# Patient Record
Sex: Male | Born: 1935 | Race: White | Hispanic: No | Marital: Married | Smoking: Never smoker
Health system: Southern US, Community
[De-identification: ages and names within clinical notes are randomized; demographics above are authoritative.]

## PROBLEM LIST (undated history)

## (undated) DIAGNOSIS — I10 Essential (primary) hypertension: Secondary | ICD-10-CM

## (undated) DIAGNOSIS — IMO0002 Reserved for concepts with insufficient information to code with codable children: Secondary | ICD-10-CM

## (undated) DIAGNOSIS — G5600 Carpal tunnel syndrome, unspecified upper limb: Secondary | ICD-10-CM

## (undated) DIAGNOSIS — E785 Hyperlipidemia, unspecified: Secondary | ICD-10-CM

## (undated) DIAGNOSIS — Z8619 Personal history of other infectious and parasitic diseases: Secondary | ICD-10-CM

## (undated) DIAGNOSIS — I4891 Unspecified atrial fibrillation: Secondary | ICD-10-CM

## (undated) DIAGNOSIS — D649 Anemia, unspecified: Secondary | ICD-10-CM

## (undated) DIAGNOSIS — I480 Paroxysmal atrial fibrillation: Secondary | ICD-10-CM

## (undated) DIAGNOSIS — H9190 Unspecified hearing loss, unspecified ear: Secondary | ICD-10-CM

## (undated) DIAGNOSIS — R7611 Nonspecific reaction to tuberculin skin test without active tuberculosis: Secondary | ICD-10-CM

## (undated) DIAGNOSIS — M1990 Unspecified osteoarthritis, unspecified site: Secondary | ICD-10-CM

## (undated) HISTORY — DX: Reserved for concepts with insufficient information to code with codable children: IMO0002

## (undated) HISTORY — PX: OTHER SURGICAL HISTORY: SHX169

## (undated) HISTORY — DX: Nonspecific reaction to tuberculin skin test without active tuberculosis: R76.11

## (undated) HISTORY — DX: Carpal tunnel syndrome, unspecified upper limb: G56.00

## (undated) HISTORY — DX: Anemia, unspecified: D64.9

## (undated) HISTORY — PX: EYE SURGERY: SHX253

## (undated) HISTORY — PX: CERVICAL FUSION: SHX112

## (undated) HISTORY — DX: Essential (primary) hypertension: I10

## (undated) HISTORY — DX: Hyperlipidemia, unspecified: E78.5

## (undated) HISTORY — PX: TONSILLECTOMY: SUR1361

---

## 1998-01-31 ENCOUNTER — Other Ambulatory Visit: Admission: RE | Admit: 1998-01-31 | Discharge: 1998-01-31 | Payer: Self-pay | Admitting: *Deleted

## 1998-02-11 ENCOUNTER — Other Ambulatory Visit: Admission: RE | Admit: 1998-02-11 | Discharge: 1998-02-11 | Payer: Self-pay | Admitting: *Deleted

## 2001-05-19 ENCOUNTER — Ambulatory Visit (HOSPITAL_COMMUNITY): Admission: RE | Admit: 2001-05-19 | Discharge: 2001-05-19 | Payer: Self-pay | Admitting: Gastroenterology

## 2001-10-14 ENCOUNTER — Encounter: Payer: Self-pay | Admitting: Gastroenterology

## 2001-10-14 ENCOUNTER — Encounter: Admission: RE | Admit: 2001-10-14 | Discharge: 2001-10-14 | Payer: Self-pay | Admitting: Gastroenterology

## 2004-03-31 ENCOUNTER — Ambulatory Visit (HOSPITAL_COMMUNITY): Admission: RE | Admit: 2004-03-31 | Discharge: 2004-03-31 | Payer: Self-pay | Admitting: Dermatology

## 2004-05-11 ENCOUNTER — Ambulatory Visit (HOSPITAL_COMMUNITY): Admission: RE | Admit: 2004-05-11 | Discharge: 2004-05-11 | Payer: Self-pay | Admitting: General Surgery

## 2004-05-11 ENCOUNTER — Ambulatory Visit (HOSPITAL_BASED_OUTPATIENT_CLINIC_OR_DEPARTMENT_OTHER): Admission: RE | Admit: 2004-05-11 | Discharge: 2004-05-11 | Payer: Self-pay | Admitting: General Surgery

## 2004-05-11 ENCOUNTER — Encounter (INDEPENDENT_AMBULATORY_CARE_PROVIDER_SITE_OTHER): Payer: Self-pay | Admitting: *Deleted

## 2004-06-27 ENCOUNTER — Ambulatory Visit (HOSPITAL_COMMUNITY): Admission: RE | Admit: 2004-06-27 | Discharge: 2004-06-27 | Payer: Self-pay | Admitting: General Surgery

## 2004-06-27 ENCOUNTER — Encounter (INDEPENDENT_AMBULATORY_CARE_PROVIDER_SITE_OTHER): Payer: Self-pay | Admitting: *Deleted

## 2006-08-16 HISTORY — PX: OTHER SURGICAL HISTORY: SHX169

## 2006-08-16 LAB — HM COLONOSCOPY

## 2006-12-11 ENCOUNTER — Ambulatory Visit: Payer: Self-pay | Admitting: Family Medicine

## 2006-12-11 DIAGNOSIS — D649 Anemia, unspecified: Secondary | ICD-10-CM

## 2006-12-11 DIAGNOSIS — M889 Osteitis deformans of unspecified bone: Secondary | ICD-10-CM | POA: Insufficient documentation

## 2006-12-12 ENCOUNTER — Ambulatory Visit: Payer: Self-pay | Admitting: Family Medicine

## 2006-12-12 LAB — CONVERTED CEMR LAB
AST: 26 units/L (ref 0–37)
Alkaline Phosphatase: 57 units/L (ref 39–117)
BUN: 18 mg/dL (ref 6–23)
Basophils Absolute: 0.1 10*3/uL (ref 0.0–0.1)
Basophils Relative: 1.1 % — ABNORMAL HIGH (ref 0.0–1.0)
Bilirubin, Direct: 0.1 mg/dL (ref 0.0–0.3)
CO2: 29 meq/L (ref 19–32)
Calcium: 9 mg/dL (ref 8.4–10.5)
Chloride: 105 meq/L (ref 96–112)
Cholesterol: 103 mg/dL (ref 0–200)
Creatinine, Ser: 0.8 mg/dL (ref 0.4–1.5)
Eosinophils Absolute: 0.2 10*3/uL (ref 0.0–0.6)
Eosinophils Relative: 3.7 % (ref 0.0–5.0)
Ferritin: 101 ng/mL (ref 22.0–322.0)
GFR calc non Af Amer: 99 mL/min
Glucose, Bld: 140 mg/dL — ABNORMAL HIGH (ref 70–99)
HDL: 35.3 mg/dL — ABNORMAL LOW (ref >39.0)
Hemoglobin: 11 g/dL — ABNORMAL LOW (ref 13.0–17.0)
LDL Cholesterol: 42 mg/dL (ref 0–99)
Lymphocytes Relative: 27.5 % (ref 12.0–46.0)
MCHC: 34.7 g/dL (ref 30.0–36.0)
MCV: 92 fL (ref 78.0–100.0)
Monocytes Relative: 8.1 % (ref 3.0–11.0)
Neutrophils Relative %: 59.6 % (ref 43.0–77.0)
Total Bilirubin: 1 mg/dL (ref 0.3–1.2)

## 2006-12-17 ENCOUNTER — Ambulatory Visit: Payer: Self-pay | Admitting: Family Medicine

## 2006-12-17 ENCOUNTER — Encounter: Payer: Self-pay | Admitting: *Deleted

## 2007-01-28 ENCOUNTER — Ambulatory Visit: Payer: Self-pay | Admitting: Family Medicine

## 2007-01-28 ENCOUNTER — Encounter: Payer: Self-pay | Admitting: *Deleted

## 2007-02-25 ENCOUNTER — Encounter: Payer: Self-pay | Admitting: Family Medicine

## 2007-03-06 ENCOUNTER — Ambulatory Visit: Payer: Self-pay | Admitting: Family Medicine

## 2007-04-04 ENCOUNTER — Ambulatory Visit: Payer: Self-pay | Admitting: Family Medicine

## 2007-05-02 ENCOUNTER — Ambulatory Visit: Payer: Self-pay | Admitting: Family Medicine

## 2007-05-12 ENCOUNTER — Ambulatory Visit: Payer: Self-pay | Admitting: Family Medicine

## 2007-05-12 DIAGNOSIS — E785 Hyperlipidemia, unspecified: Secondary | ICD-10-CM

## 2007-05-12 DIAGNOSIS — N259 Disorder resulting from impaired renal tubular function, unspecified: Secondary | ICD-10-CM

## 2007-05-12 DIAGNOSIS — E1169 Type 2 diabetes mellitus with other specified complication: Secondary | ICD-10-CM | POA: Insufficient documentation

## 2007-05-13 LAB — CONVERTED CEMR LAB
ALT: 15 units/L (ref 0–53)
BUN: 17 mg/dL (ref 6–23)
Basophils Absolute: 0.1 10*3/uL (ref 0.0–0.1)
CO2: 30 meq/L (ref 19–32)
Chloride: 109 meq/L (ref 96–112)
Creatinine, Ser: 1.1 mg/dL (ref 0.4–1.5)
Eosinophils Relative: 3.4 % (ref 0.0–5.0)
GFR calc Af Amer: 83 mL/min
Glucose, Bld: 125 mg/dL — ABNORMAL HIGH (ref 70–99)
Hgb A1c MFr Bld: 6.9 % — ABNORMAL HIGH (ref 4.6–6.0)
LDL Cholesterol: 48 mg/dL (ref 0–99)
Lymphocytes Relative: 34.3 % (ref 12.0–46.0)
Potassium: 5.7 meq/L — ABNORMAL HIGH (ref 3.5–5.1)
RDW: 12.9 % (ref 11.5–14.6)
Sodium: 146 meq/L — ABNORMAL HIGH (ref 135–145)
TSH: 2.43 microintl units/mL (ref 0.35–5.50)
Total CHOL/HDL Ratio: 3.9
Total Protein: 6.5 g/dL (ref 6.0–8.3)

## 2007-05-30 ENCOUNTER — Ambulatory Visit: Payer: Self-pay | Admitting: Family Medicine

## 2007-06-30 ENCOUNTER — Ambulatory Visit: Payer: Self-pay | Admitting: Family Medicine

## 2007-07-02 ENCOUNTER — Encounter: Payer: Self-pay | Admitting: Family Medicine

## 2007-07-21 ENCOUNTER — Ambulatory Visit: Payer: Self-pay | Admitting: Family Medicine

## 2007-07-21 DIAGNOSIS — I10 Essential (primary) hypertension: Secondary | ICD-10-CM

## 2007-07-30 ENCOUNTER — Ambulatory Visit: Payer: Self-pay | Admitting: Family Medicine

## 2007-07-30 DIAGNOSIS — M545 Low back pain: Secondary | ICD-10-CM

## 2007-08-04 ENCOUNTER — Ambulatory Visit: Payer: Self-pay | Admitting: Family Medicine

## 2007-08-04 DIAGNOSIS — H6980 Other specified disorders of Eustachian tube, unspecified ear: Secondary | ICD-10-CM

## 2007-08-11 ENCOUNTER — Ambulatory Visit: Payer: Self-pay | Admitting: Family Medicine

## 2007-08-11 DIAGNOSIS — H9190 Unspecified hearing loss, unspecified ear: Secondary | ICD-10-CM | POA: Insufficient documentation

## 2007-08-12 ENCOUNTER — Telehealth: Payer: Self-pay | Admitting: Family Medicine

## 2007-08-21 ENCOUNTER — Encounter: Payer: Self-pay | Admitting: Family Medicine

## 2007-08-26 ENCOUNTER — Encounter: Payer: Self-pay | Admitting: Family Medicine

## 2007-08-29 ENCOUNTER — Encounter: Payer: Self-pay | Admitting: Family Medicine

## 2007-09-03 ENCOUNTER — Ambulatory Visit: Payer: Self-pay | Admitting: Family Medicine

## 2007-09-24 ENCOUNTER — Ambulatory Visit: Payer: Self-pay | Admitting: Family Medicine

## 2007-09-26 ENCOUNTER — Telehealth: Payer: Self-pay | Admitting: Family Medicine

## 2007-10-21 ENCOUNTER — Encounter: Payer: Self-pay | Admitting: Family Medicine

## 2007-10-31 ENCOUNTER — Ambulatory Visit: Payer: Self-pay | Admitting: Family Medicine

## 2007-11-28 ENCOUNTER — Ambulatory Visit: Payer: Self-pay | Admitting: Family Medicine

## 2007-12-10 ENCOUNTER — Encounter: Payer: Self-pay | Admitting: Family Medicine

## 2007-12-15 ENCOUNTER — Ambulatory Visit: Payer: Self-pay | Admitting: Family Medicine

## 2007-12-15 DIAGNOSIS — B029 Zoster without complications: Secondary | ICD-10-CM | POA: Insufficient documentation

## 2007-12-16 ENCOUNTER — Telehealth: Payer: Self-pay | Admitting: Family Medicine

## 2007-12-29 ENCOUNTER — Ambulatory Visit: Payer: Self-pay | Admitting: Family Medicine

## 2007-12-29 DIAGNOSIS — F411 Generalized anxiety disorder: Secondary | ICD-10-CM | POA: Insufficient documentation

## 2007-12-29 DIAGNOSIS — R252 Cramp and spasm: Secondary | ICD-10-CM | POA: Insufficient documentation

## 2008-01-06 ENCOUNTER — Ambulatory Visit: Payer: Self-pay | Admitting: Family Medicine

## 2008-01-06 LAB — CONVERTED CEMR LAB
ALT: 35 units/L (ref 0–53)
AST: 39 units/L — ABNORMAL HIGH (ref 0–37)
Albumin: 3.1 g/dL — ABNORMAL LOW (ref 3.5–5.2)
BUN: 13 mg/dL (ref 6–23)
Basophils Absolute: 0 10*3/uL (ref 0.0–0.1)
Bilirubin, Direct: 0.1 mg/dL (ref 0.0–0.3)
CO2: 24 meq/L (ref 19–32)
Chloride: 89 meq/L — ABNORMAL LOW (ref 96–112)
Creatinine, Ser: 1.2 mg/dL (ref 0.4–1.5)
Eosinophils Absolute: 0 10*3/uL (ref 0.0–0.7)
Glucose, Bld: 315 mg/dL — ABNORMAL HIGH (ref 70–99)
HCT: 31.4 % — ABNORMAL LOW (ref 39.0–52.0)
Lymphocytes Relative: 10 % — ABNORMAL LOW (ref 12.0–46.0)
Monocytes Absolute: 0.6 10*3/uL (ref 0.1–1.0)
Neutrophils Relative %: 81.5 % — ABNORMAL HIGH (ref 43.0–77.0)
RBC: 3.36 M/uL — ABNORMAL LOW (ref 4.22–5.81)
TSH: 1.59 microintl units/mL (ref 0.35–5.50)
Total Bilirubin: 0.8 mg/dL (ref 0.3–1.2)
Total CK: 156 units/L (ref 7–195)
WBC: 7.9 10*3/uL (ref 4.5–10.5)

## 2008-01-12 ENCOUNTER — Ambulatory Visit: Payer: Self-pay | Admitting: Family Medicine

## 2008-01-13 ENCOUNTER — Telehealth: Payer: Self-pay | Admitting: Family Medicine

## 2008-01-13 ENCOUNTER — Encounter: Admission: RE | Admit: 2008-01-13 | Discharge: 2008-01-13 | Payer: Self-pay | Admitting: Family Medicine

## 2008-01-14 ENCOUNTER — Telehealth: Payer: Self-pay | Admitting: Family Medicine

## 2008-01-29 ENCOUNTER — Ambulatory Visit: Payer: Self-pay | Admitting: Family Medicine

## 2008-02-09 DIAGNOSIS — E872 Acidosis: Secondary | ICD-10-CM

## 2008-02-20 ENCOUNTER — Telehealth: Payer: Self-pay | Admitting: Family Medicine

## 2008-02-23 ENCOUNTER — Ambulatory Visit: Payer: Self-pay | Admitting: Family Medicine

## 2008-02-27 ENCOUNTER — Telehealth: Payer: Self-pay | Admitting: Family Medicine

## 2008-03-01 ENCOUNTER — Encounter: Payer: Self-pay | Admitting: Family Medicine

## 2008-03-05 ENCOUNTER — Encounter: Admission: RE | Admit: 2008-03-05 | Discharge: 2008-03-05 | Payer: Self-pay | Admitting: Neurology

## 2008-03-08 ENCOUNTER — Encounter: Payer: Self-pay | Admitting: Family Medicine

## 2008-03-08 ENCOUNTER — Encounter: Admission: RE | Admit: 2008-03-08 | Discharge: 2008-04-30 | Payer: Self-pay | Admitting: Neurology

## 2008-03-25 ENCOUNTER — Ambulatory Visit: Payer: Self-pay | Admitting: Family Medicine

## 2008-03-31 ENCOUNTER — Encounter: Payer: Self-pay | Admitting: Family Medicine

## 2008-04-02 ENCOUNTER — Encounter: Payer: Self-pay | Admitting: Family Medicine

## 2008-04-22 ENCOUNTER — Ambulatory Visit: Payer: Self-pay | Admitting: Family Medicine

## 2008-05-04 ENCOUNTER — Ambulatory Visit: Payer: Self-pay | Admitting: Cardiovascular Disease

## 2008-05-04 ENCOUNTER — Ambulatory Visit: Payer: Self-pay | Admitting: Internal Medicine

## 2008-05-04 ENCOUNTER — Inpatient Hospital Stay (HOSPITAL_COMMUNITY): Admission: AD | Admit: 2008-05-04 | Discharge: 2008-05-11 | Payer: Self-pay | Admitting: Neurosurgery

## 2008-05-05 ENCOUNTER — Ambulatory Visit: Payer: Self-pay | Admitting: Cardiology

## 2008-05-05 ENCOUNTER — Encounter (INDEPENDENT_AMBULATORY_CARE_PROVIDER_SITE_OTHER): Payer: Self-pay | Admitting: Neurosurgery

## 2008-05-10 ENCOUNTER — Ambulatory Visit: Payer: Self-pay | Admitting: Physical Medicine & Rehabilitation

## 2008-05-11 ENCOUNTER — Ambulatory Visit: Payer: Self-pay | Admitting: Physical Medicine & Rehabilitation

## 2008-05-11 ENCOUNTER — Ambulatory Visit: Payer: Self-pay | Admitting: Cardiovascular Disease

## 2008-05-11 ENCOUNTER — Inpatient Hospital Stay (HOSPITAL_COMMUNITY)
Admission: RE | Admit: 2008-05-11 | Discharge: 2008-05-26 | Payer: Self-pay | Admitting: Physical Medicine & Rehabilitation

## 2008-05-26 ENCOUNTER — Encounter: Payer: Self-pay | Admitting: Family Medicine

## 2008-06-09 ENCOUNTER — Encounter: Payer: Self-pay | Admitting: Family Medicine

## 2008-06-28 ENCOUNTER — Telehealth: Payer: Self-pay | Admitting: Family Medicine

## 2008-06-29 ENCOUNTER — Encounter
Admission: RE | Admit: 2008-06-29 | Discharge: 2008-06-30 | Payer: Self-pay | Admitting: Physical Medicine & Rehabilitation

## 2008-06-30 ENCOUNTER — Ambulatory Visit: Payer: Self-pay | Admitting: Physical Medicine & Rehabilitation

## 2008-07-01 ENCOUNTER — Ambulatory Visit: Payer: Self-pay | Admitting: Family Medicine

## 2008-07-01 DIAGNOSIS — M542 Cervicalgia: Secondary | ICD-10-CM

## 2008-07-12 ENCOUNTER — Telehealth: Payer: Self-pay | Admitting: Family Medicine

## 2008-07-13 ENCOUNTER — Telehealth: Payer: Self-pay | Admitting: Family Medicine

## 2008-07-26 ENCOUNTER — Telehealth: Payer: Self-pay | Admitting: Family Medicine

## 2008-07-28 ENCOUNTER — Encounter: Payer: Self-pay | Admitting: Family Medicine

## 2008-08-03 ENCOUNTER — Ambulatory Visit: Payer: Self-pay | Admitting: Family Medicine

## 2008-08-05 ENCOUNTER — Encounter: Payer: Self-pay | Admitting: Family Medicine

## 2008-08-05 ENCOUNTER — Encounter: Admission: RE | Admit: 2008-08-05 | Discharge: 2008-11-01 | Payer: Self-pay | Admitting: Neurology

## 2008-08-11 ENCOUNTER — Telehealth: Payer: Self-pay | Admitting: Family Medicine

## 2008-08-18 ENCOUNTER — Encounter: Payer: Self-pay | Admitting: Family Medicine

## 2008-08-24 ENCOUNTER — Ambulatory Visit: Payer: Self-pay | Admitting: Physical Medicine & Rehabilitation

## 2008-08-24 ENCOUNTER — Encounter
Admission: RE | Admit: 2008-08-24 | Discharge: 2008-08-24 | Payer: Self-pay | Admitting: Physical Medicine & Rehabilitation

## 2008-08-31 ENCOUNTER — Ambulatory Visit (HOSPITAL_COMMUNITY): Admission: RE | Admit: 2008-08-31 | Discharge: 2008-08-31 | Payer: Self-pay | Admitting: Neurology

## 2008-09-02 ENCOUNTER — Ambulatory Visit: Payer: Self-pay | Admitting: Family Medicine

## 2008-09-27 ENCOUNTER — Ambulatory Visit: Payer: Self-pay | Admitting: Family Medicine

## 2008-09-28 ENCOUNTER — Encounter: Payer: Self-pay | Admitting: Family Medicine

## 2008-09-28 LAB — CONVERTED CEMR LAB
ALT: 15 units/L (ref 0–53)
AST: 30 units/L (ref 0–37)
Albumin: 3.9 g/dL (ref 3.5–5.2)
Alkaline Phosphatase: 61 units/L (ref 39–117)
Basophils Absolute: 0 10*3/uL (ref 0.0–0.1)
Basophils Relative: 0 % (ref 0.0–3.0)
Calcium: 9.2 mg/dL (ref 8.4–10.5)
Eosinophils Absolute: 0 10*3/uL (ref 0.0–0.7)
GFR calc Af Amer: 104 mL/min
Glucose, Bld: 83 mg/dL (ref 70–99)
HDL: 41.5 mg/dL (ref >39.0)
Hemoglobin: 11.6 g/dL — ABNORMAL LOW (ref 13.0–17.0)
Hgb A1c MFr Bld: 6.5 % — ABNORMAL HIGH (ref 4.6–6.0)
MCHC: 34.3 g/dL (ref 30.0–36.0)
Monocytes Relative: 9.6 % (ref 3.0–12.0)
Platelets: 200 10*3/uL (ref 150–400)
Potassium: 4.4 meq/L (ref 3.5–5.1)
RDW: 12.9 % (ref 11.5–14.6)
TSH: 1.89 microintl units/mL (ref 0.35–5.50)
Total CHOL/HDL Ratio: 2.7
Total Protein: 6.6 g/dL (ref 6.0–8.3)
Triglycerides: 60 mg/dL (ref 0–149)
WBC: 5.8 10*3/uL (ref 4.5–10.5)

## 2008-10-19 ENCOUNTER — Encounter: Payer: Self-pay | Admitting: Family Medicine

## 2008-10-27 ENCOUNTER — Encounter: Admission: RE | Admit: 2008-10-27 | Discharge: 2009-01-25 | Payer: Self-pay | Admitting: Neurology

## 2008-10-28 ENCOUNTER — Ambulatory Visit: Payer: Self-pay | Admitting: Family Medicine

## 2008-11-02 ENCOUNTER — Telehealth: Payer: Self-pay | Admitting: Family Medicine

## 2008-11-10 ENCOUNTER — Encounter: Payer: Self-pay | Admitting: Family Medicine

## 2008-11-11 ENCOUNTER — Encounter: Payer: Self-pay | Admitting: Family Medicine

## 2008-11-22 ENCOUNTER — Ambulatory Visit: Payer: Self-pay | Admitting: Family Medicine

## 2008-12-01 ENCOUNTER — Ambulatory Visit: Payer: Self-pay | Admitting: Family Medicine

## 2008-12-07 ENCOUNTER — Encounter: Payer: Self-pay | Admitting: Family Medicine

## 2008-12-15 ENCOUNTER — Telehealth (INDEPENDENT_AMBULATORY_CARE_PROVIDER_SITE_OTHER): Payer: Self-pay

## 2008-12-16 ENCOUNTER — Telehealth (INDEPENDENT_AMBULATORY_CARE_PROVIDER_SITE_OTHER): Payer: Self-pay | Admitting: *Deleted

## 2008-12-17 ENCOUNTER — Telehealth (INDEPENDENT_AMBULATORY_CARE_PROVIDER_SITE_OTHER): Payer: Self-pay | Admitting: *Deleted

## 2008-12-17 ENCOUNTER — Telehealth: Payer: Self-pay | Admitting: Family Medicine

## 2008-12-20 ENCOUNTER — Telehealth (INDEPENDENT_AMBULATORY_CARE_PROVIDER_SITE_OTHER): Payer: Self-pay | Admitting: *Deleted

## 2008-12-24 ENCOUNTER — Ambulatory Visit: Payer: Self-pay | Admitting: Family Medicine

## 2008-12-30 ENCOUNTER — Telehealth (INDEPENDENT_AMBULATORY_CARE_PROVIDER_SITE_OTHER): Payer: Self-pay | Admitting: *Deleted

## 2009-01-03 ENCOUNTER — Ambulatory Visit: Payer: Self-pay

## 2009-01-03 ENCOUNTER — Encounter: Payer: Self-pay | Admitting: Family Medicine

## 2009-01-12 ENCOUNTER — Encounter: Payer: Self-pay | Admitting: Family Medicine

## 2009-01-24 ENCOUNTER — Ambulatory Visit: Payer: Self-pay | Admitting: Family Medicine

## 2009-01-24 ENCOUNTER — Telehealth: Payer: Self-pay | Admitting: Family Medicine

## 2009-02-10 ENCOUNTER — Encounter
Admission: RE | Admit: 2009-02-10 | Discharge: 2009-02-14 | Payer: Self-pay | Admitting: Physical Medicine & Rehabilitation

## 2009-02-11 ENCOUNTER — Ambulatory Visit: Payer: Self-pay | Admitting: Physical Medicine & Rehabilitation

## 2009-02-25 ENCOUNTER — Ambulatory Visit: Payer: Self-pay | Admitting: Family Medicine

## 2009-03-28 ENCOUNTER — Ambulatory Visit: Payer: Self-pay | Admitting: Family Medicine

## 2009-03-29 LAB — CONVERTED CEMR LAB: Hgb A1c MFr Bld: 7.2 % — ABNORMAL HIGH (ref 4.6–6.5)

## 2009-04-25 ENCOUNTER — Ambulatory Visit: Payer: Self-pay | Admitting: Family Medicine

## 2009-05-25 ENCOUNTER — Ambulatory Visit: Payer: Self-pay | Admitting: Family Medicine

## 2009-06-27 ENCOUNTER — Ambulatory Visit: Payer: Self-pay | Admitting: Family Medicine

## 2009-07-18 ENCOUNTER — Encounter: Payer: Self-pay | Admitting: Family Medicine

## 2009-07-20 ENCOUNTER — Encounter: Admission: RE | Admit: 2009-07-20 | Discharge: 2009-07-20 | Payer: Self-pay | Admitting: Neurosurgery

## 2009-07-25 ENCOUNTER — Ambulatory Visit: Payer: Self-pay | Admitting: Family Medicine

## 2009-07-27 ENCOUNTER — Encounter: Payer: Self-pay | Admitting: Family Medicine

## 2009-08-26 ENCOUNTER — Ambulatory Visit: Payer: Self-pay | Admitting: Family Medicine

## 2009-09-05 ENCOUNTER — Encounter: Payer: Self-pay | Admitting: Family Medicine

## 2009-09-20 ENCOUNTER — Telehealth: Payer: Self-pay | Admitting: Family Medicine

## 2009-09-26 ENCOUNTER — Encounter: Payer: Self-pay | Admitting: Family Medicine

## 2009-09-27 ENCOUNTER — Ambulatory Visit: Payer: Self-pay | Admitting: Family Medicine

## 2009-09-27 DIAGNOSIS — N529 Male erectile dysfunction, unspecified: Secondary | ICD-10-CM | POA: Insufficient documentation

## 2009-09-27 DIAGNOSIS — E538 Deficiency of other specified B group vitamins: Secondary | ICD-10-CM

## 2009-09-27 DIAGNOSIS — G5600 Carpal tunnel syndrome, unspecified upper limb: Secondary | ICD-10-CM | POA: Insufficient documentation

## 2009-10-03 ENCOUNTER — Encounter: Payer: Self-pay | Admitting: Family Medicine

## 2009-10-24 ENCOUNTER — Ambulatory Visit: Payer: Self-pay | Admitting: Family Medicine

## 2009-10-26 ENCOUNTER — Encounter: Payer: Self-pay | Admitting: Family Medicine

## 2009-10-26 DIAGNOSIS — E875 Hyperkalemia: Secondary | ICD-10-CM

## 2009-10-26 LAB — CONVERTED CEMR LAB
BUN: 11 mg/dL (ref 6–23)
Chloride: 99 meq/L (ref 96–112)
GFR calc non Af Amer: 86.06 mL/min (ref >60)
Glucose, Bld: 88 mg/dL (ref 70–99)

## 2009-11-03 ENCOUNTER — Telehealth: Payer: Self-pay | Admitting: Family Medicine

## 2009-11-10 ENCOUNTER — Telehealth: Payer: Self-pay | Admitting: Family Medicine

## 2009-11-17 ENCOUNTER — Encounter: Payer: Self-pay | Admitting: Family Medicine

## 2009-11-22 ENCOUNTER — Ambulatory Visit: Payer: Self-pay | Admitting: Family Medicine

## 2009-11-28 ENCOUNTER — Encounter: Payer: Self-pay | Admitting: Family Medicine

## 2009-12-28 ENCOUNTER — Ambulatory Visit: Payer: Self-pay | Admitting: Family Medicine

## 2010-01-10 ENCOUNTER — Encounter: Payer: Self-pay | Admitting: Family Medicine

## 2010-01-23 ENCOUNTER — Ambulatory Visit: Payer: Self-pay | Admitting: Family Medicine

## 2010-01-24 ENCOUNTER — Encounter: Payer: Self-pay | Admitting: Family Medicine

## 2010-02-07 ENCOUNTER — Encounter: Payer: Self-pay | Admitting: Family Medicine

## 2010-02-22 ENCOUNTER — Ambulatory Visit: Payer: Self-pay | Admitting: Family Medicine

## 2010-03-13 ENCOUNTER — Encounter: Payer: Self-pay | Admitting: Family Medicine

## 2010-03-16 ENCOUNTER — Encounter: Admission: RE | Admit: 2010-03-16 | Discharge: 2010-03-16 | Payer: Self-pay | Admitting: Neurosurgery

## 2010-03-22 ENCOUNTER — Encounter: Payer: Self-pay | Admitting: Family Medicine

## 2010-03-24 ENCOUNTER — Ambulatory Visit: Payer: Self-pay | Admitting: Family Medicine

## 2010-03-27 LAB — CONVERTED CEMR LAB: Hgb A1c MFr Bld: 6.6 % — ABNORMAL HIGH (ref 4.6–6.5)

## 2010-05-01 ENCOUNTER — Ambulatory Visit: Payer: Self-pay | Admitting: Family Medicine

## 2010-05-26 ENCOUNTER — Ambulatory Visit: Payer: Self-pay | Admitting: Family Medicine

## 2010-06-13 ENCOUNTER — Encounter: Payer: Self-pay | Admitting: Family Medicine

## 2010-06-27 ENCOUNTER — Ambulatory Visit: Payer: Self-pay | Admitting: Family Medicine

## 2010-07-19 ENCOUNTER — Encounter: Payer: Self-pay | Admitting: Family Medicine

## 2010-07-25 ENCOUNTER — Ambulatory Visit
Admission: RE | Admit: 2010-07-25 | Discharge: 2010-07-25 | Payer: Self-pay | Source: Home / Self Care | Attending: Family Medicine | Admitting: Family Medicine

## 2010-08-06 ENCOUNTER — Encounter: Payer: Self-pay | Admitting: Physical Medicine & Rehabilitation

## 2010-08-07 ENCOUNTER — Encounter: Payer: Self-pay | Admitting: Family Medicine

## 2010-08-13 LAB — CONVERTED CEMR LAB
ALT: 21 units/L (ref 0–53)
AST: 33 units/L (ref 0–37)
Albumin: 4.2 g/dL (ref 3.5–5.2)
Alkaline Phosphatase: 60 units/L (ref 39–117)
Alkaline Phosphatase: 88 units/L (ref 39–117)
BUN: 12 mg/dL (ref 6–23)
BUN: 23 mg/dL (ref 6–23)
Basophils Absolute: 0 10*3/uL (ref 0.0–0.1)
Basophils Relative: 0.2 % (ref 0.0–3.0)
Basophils Relative: 0.7 % (ref 0.0–3.0)
Bilirubin, Direct: 0.1 mg/dL (ref 0.0–0.3)
Blood Glucose, Fingerstick: 202
CO2: 25 meq/L (ref 19–32)
Calcium: 9.2 mg/dL (ref 8.4–10.5)
Calcium: 9.3 mg/dL (ref 8.4–10.5)
Chloride: 102 meq/L (ref 96–112)
Chloride: 97 meq/L (ref 96–112)
Cholesterol: 105 mg/dL (ref 0–200)
Cholesterol: 106 mg/dL (ref 0–200)
Creatinine, Ser: 0.9 mg/dL (ref 0.4–1.5)
Creatinine,U: 71.2 mg/dL
GFR calc non Af Amer: 69 mL/min
GFR calc non Af Amer: 86.08 mL/min (ref >60)
Glucose, Bld: 96 mg/dL (ref 70–99)
Glucose, Bld: 97 mg/dL (ref 70–99)
Glucose, Urine, Semiquant: 250
HCT: 31 % — ABNORMAL LOW (ref 39.0–52.0)
HDL: 57.1 mg/dL (ref >39.00)
Hemoglobin: 10.6 g/dL — ABNORMAL LOW (ref 13.0–17.0)
Hgb A1c MFr Bld: 8.3 % — ABNORMAL HIGH (ref 4.6–6.0)
Lymphocytes Relative: 29.2 % (ref 12.0–46.0)
MCHC: 34.2 g/dL (ref 30.0–36.0)
Microalb, Ur: 0.5 mg/dL (ref 0.0–1.9)
Monocytes Absolute: 0.6 10*3/uL (ref 0.1–1.0)
Neutro Abs: 4.4 10*3/uL (ref 1.4–7.7)
Nitrite: NEGATIVE
PSA: 2.03 ng/mL (ref 0.10–4.00)
Potassium: 5.7 meq/L — ABNORMAL HIGH (ref 3.5–5.1)
Protein, U semiquant: NEGATIVE
RBC: 3.29 M/uL — ABNORMAL LOW (ref 4.22–5.81)
Specific Gravity, Urine: 1.01
Testosterone: 359.18 ng/dL (ref 350.00–890.00)
Total Bilirubin: 1 mg/dL (ref 0.3–1.2)
Total CHOL/HDL Ratio: 2
Total Protein: 6.8 g/dL (ref 6.0–8.3)
VLDL: 14.2 mg/dL (ref 0.0–40.0)
Vitamin B-12: >1500 pg/mL — ABNORMAL HIGH (ref 211–911)
WBC Urine, dipstick: NEGATIVE
pH: 5

## 2010-08-17 NOTE — Progress Notes (Signed)
Summary: speak to Andrew Norton  Phone Note Call from Patient Call back at Home Phone [phone]   Caller: Patient--live call Summary of Call: wants Andrew Norton to call him about his referral. Initial call taken by: Warnell Forester,  November 03, 2009 8:53 AM  Follow-up for Phone Call        Terri, I called him- he says it's been a long time to wait. I said you were working on it. Follow-up by: Raechel Ache, RN,  November 03, 2009 9:02 AM  Additional Follow-up for Phone Call Additional follow up Details #1::        I tried to call pt to explain process, but was unable to reach him. Additional Follow-up by: Corky Mull,  November 03, 2009 11:13 AM

## 2010-08-17 NOTE — Letter (Signed)
Summary: [facility]  [facility]   Imported By: Maryln Gottron 01/27/2010 14:40:19  _____________________________________________________________________  External Attachment:    Type:   Image     Comment:   External Document

## 2010-08-17 NOTE — Assessment & Plan Note (Signed)
Summary: b12 inj/njr  Nurse Visit   Allergies: 1)  Penicillin V Potassium (Penicillin V Potassium)  Medication Administration  Injection # 1:    Medication: Vit B12 1000 mcg    Diagnosis: VITAMIN B12 DEFICIENCY (ICD-266.2)    Route: IM    Site: L deltoid    Exp Date: 02/13    Lot #: [id number]    Mfr: [employer]    Patient tolerated injection without complications    Given by: Raechel Ache, RN (January 23, 2010 10:21 AM)  Orders Added: 1)  Vit B12 1000 mcg [J3420] 2)  Admin of Therapeutic Inj  intramuscular or subcutaneous [16109]

## 2010-08-17 NOTE — Assessment & Plan Note (Signed)
Summary: b12 inj/njr  Nurse Visit   Allergies: 1)  Penicillin V Potassium (Penicillin V Potassium)  Medication Administration  Injection # 1:    Medication: Vit B12 1000 mcg    Diagnosis: VITAMIN B12 DEFICIENCY (ICD-266.2)    Route: IM    Site: L deltoid    Exp Date: 09/12    Lot #: [id number]    Mfr: [employer]    Patient tolerated injection without complications    Given by: Raechel Ache, RN (December 28, 2009 2:59 PM)  Orders Added: 1)  Vit B12 1000 mcg [J3420] 2)  Admin of Therapeutic Inj  intramuscular or subcutaneous [01027]

## 2010-08-17 NOTE — Letter (Signed)
Summary: [facility]  [facility]   Imported By: Maryln Gottron 02/13/2010 11:24:33  _____________________________________________________________________  External Attachment:    Type:   Image     Comment:   External Document

## 2010-08-17 NOTE — Assessment & Plan Note (Signed)
Summary: B12 INJ // RS  Nurse Visit   Allergies: 1)  Penicillin V Potassium (Penicillin V Potassium)  Medication Administration  Injection # 1:    Medication: Vit B12 1000 mcg    Diagnosis: VITAMIN B12 DEFICIENCY (ICD-266.2)    Route: IM    Site: L deltoid    Exp Date: 09/12    Lot #: [id number]    Mfr: [employer]    Patient tolerated injection without complications    Given by: Raechel Ache, RN (February 22, 2010 10:08 AM)  Orders Added: 1)  Vit B12 1000 mcg [J3420] 2)  Admin of Therapeutic Inj  intramuscular or subcutaneous [96045]

## 2010-08-17 NOTE — Letter (Signed)
Summary: [facility]  [facility]   Imported By: Maryln Gottron 08/04/2009 13:45:00  _____________________________________________________________________  External Attachment:    Type:   Image     Comment:   External Document

## 2010-08-17 NOTE — Assessment & Plan Note (Signed)
Summary: B12 INJ // RS left delt  Nurse Visit   Allergies: 1)  Penicillin V Potassium (Penicillin V Potassium)  Medication Administration  Injection # 1:    Medication: Vit B12 1000 mcg    Diagnosis: VITAMIN B12 DEFICIENCY (ICD-266.2)    Route: IM    Site: L deltoid    Exp Date: 01/2012    Lot #: [id number]    Mfr: [employer]    Patient tolerated injection without complications    Given by: Pura Spice, RN (May 01, 2010 10:24 AM)  Orders Added: 1)  Vit B12 1000 mcg [J3420] 2)  Admin of Therapeutic Inj  intramuscular or subcutaneous [60454]

## 2010-08-17 NOTE — Assessment & Plan Note (Signed)
Summary: B-12 INJ/CJR  Nurse Visit   Allergies: 1)  Penicillin V Potassium (Penicillin V Potassium)  Medication Administration  Injection # 1:    Medication: Vit B12 1000 mcg    Diagnosis: VITAMIN B12 DEFICIENCY (ICD-266.2)    Route: IM    Site: L deltoid    Exp Date: 02/14/2012    Lot #: [id number]    Mfr: [employer]    Patient tolerated injection without complications    Given by: Romualdo Bolk, CMA (AAMA) (July 25, 2010 10:18 AM)  Orders Added: 1)  Vit B12 1000 mcg [J3420] 2)  Admin of Therapeutic Inj  intramuscular or subcutaneous [82956]

## 2010-08-17 NOTE — Assessment & Plan Note (Signed)
Summary: B12 INJ // RS rt deltoid  Nurse Visit   Allergies: 1)  Penicillin V Potassium (Penicillin V Potassium)  Medication Administration  Injection # 1:    Medication: Vit B12 1000 mcg    Diagnosis: ANEMIA-NOS (ICD-285.9)    Route: IM    Site: R deltoid    Exp Date: 03/2011    Lot #: [id number]    Mfr: [employer]    Patient tolerated injection without complications    Given by: Pura Spice, RN (August 26, 2009 9:29 AM)  Orders Added: 1)  Vit B12 1000 mcg [J3420] 2)  Admin of Therapeutic Inj  intramuscular or subcutaneous [96045]

## 2010-08-17 NOTE — Assessment & Plan Note (Signed)
Summary: B-12 INJ/CJR  Nurse Visit   Allergies: 1)  Penicillin V Potassium (Penicillin V Potassium)  Medication Administration  Injection # 1:    Medication: Vit B12 1000 mcg    Diagnosis: VITAMIN B12 DEFICIENCY (ICD-266.2)    Route: IM    Site: L deltoid    Exp Date: 09/12    Lot #: [id number]    Mfr: [employer]    Patient tolerated injection without complications    Given by: Raechel Ache, RN (October 24, 2009 10:27 AM)  Orders Added: 1)  TLB-BMP (Basic Metabolic Panel-BMET) [80048-METABOL] 2)  Vit B12 1000 mcg [J3420] 3)  Admin of Therapeutic Inj  intramuscular or subcutaneous [96372]  Appended Document: Orders Update    Clinical Lists Changes  Orders: Added new Service order of Venipuncture [id number]) - Signed

## 2010-08-17 NOTE — Assessment & Plan Note (Signed)
Summary: b12 inj//ccm  Nurse Visit   Allergies: 1)  Penicillin V Potassium (Penicillin V Potassium)  Medication Administration  Injection # 1:    Medication: Vit B12 1000 mcg    Diagnosis: VITAMIN B12 DEFICIENCY (ICD-266.2)    Route: SQ    Site: R deltoid    Exp Date: 01/15/2012    Lot #: [id number]    Mfr: [employer]    Patient tolerated injection without complications    Given by: Lynann Beaver CMA AAMA (June 27, 2010 10:31 AM)  Orders Added: 1)  Admin of Therapeutic Inj  intramuscular or subcutaneous [56213]

## 2010-08-17 NOTE — Assessment & Plan Note (Signed)
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 He feels well in general,and he continues to improve after his neck surgery. His glucose and BP readings at home are stable.   Allergies: 1)  Penicillin V Potassium (Penicillin V Potassium)  Past History:  Past Medical History: Reviewed history from 09/27/2009 and no changes required. 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 Margo Aye for skin checks sees Dr. Pearlean Brownie for eye exams gait instability, sees Dr. Sandria Manly carpal tunnel, bilateral per Dr. Sandria Manly  Past Surgical History: Reviewed history from 07/01/2008 and no changes required. Tonsillectomy colonoscopy 2-08 per Dr. Randa Evens, normal EGD 2-08, normal removal of part of the scrotum for Paget's disease per Dr. Zachery Dakins 3 level cervical spine fusion per Dr. [name] 10-09  Review of Systems  The patient denies anorexia, fever, weight loss, weight gain, vision loss, decreased hearing, hoarseness, chest pain, syncope, dyspnea on exertion, peripheral edema, prolonged cough, headaches, hemoptysis, abdominal pain, melena, hematochezia, severe indigestion/heartburn, hematuria, incontinence, genital sores, muscle weakness, suspicious skin lesions, transient blindness, difficulty walking, depression, unusual weight change, abnormal bleeding, enlarged lymph nodes, angioedema, breast masses, and testicular masses.     Flu Vaccine Consent Questions     Do you have a history of severe allergic reactions to this vaccine? no    Any prior history of allergic reactions to egg and/or gelatin? no    Do you have a sensitivity to the preservative Thimersol? no    Do you have a past history of Guillan-Barre Syndrome? no    Do you currently have an acute febrile illness? no    Have you ever had a severe reaction to latex? no    Vaccine information given and explained to patient? yes    Are you currently pregnant? no    Lot Number:[id number]   Exp Date:01/13/2011   Site Given  Left Deltoid IM Pura Spice, RN  March 24, 2010 10:38 AM   Physical Exam  General:  Well-developed,well-nourished,in no acute distress; alert,appropriate and cooperative throughout examination Neck:  No deformities, masses, or tenderness noted. Lungs:  Normal respiratory effort, chest expands symmetrically. Lungs are clear to auscultation, no crackles or wheezes. Heart:  Normal rate and regular rhythm. S1 and S2 normal without gallop, murmur, click, rub or other extra sounds.   Impression & Recommendations:  Problem # 1:  DIABETES MELLITUS, TYPE II (ICD-250.00)  The following medications were removed from the medication list:    Diovan Hct 160-12.5 Mg Tabs (Valsartan-hydrochlorothiazide) .Marland Kitchen... Take 1 tablet by mouth once a day His updated medication list for this problem includes:    Adult Aspirin Low Strength 81 Mg Chew (Aspirin) .Marland Kitchen... Take 1 tablet by mouth once a  day    Glipizide 10 Mg Tabs (Glipizide) .Marland KitchenMarland KitchenMarland KitchenMarland Kitchen  One each am    Metformin Hcl 500 Mg Tabs (Metformin hcl) .Marland Kitchen..Marland Kitchen Two times a day    Losartan Potassium 50 Mg Tabs (Losartan potassium) ..... Once daily  Orders: Venipuncture (09811) TLB-A1C / Hgb A1C (Glycohemoglobin) (83036-A1C) Specimen Handling (91478)  Problem # 2:  VITAMIN B12 DEFICIENCY (ICD-266.2)  Orders: Vit B12 1000 mcg (J3420) Admin of Therapeutic Inj  intramuscular or subcutaneous (29562) Specimen  Handling (13086)  Problem # 3:  NECK PAIN, CHRONIC (ICD-723.1)  His updated medication list for this problem includes:    Adult Aspirin Low Strength 81 Mg Chew (Aspirin) .Marland Kitchen... Take 1 tablet by mouth once a  day  Problem # 4:  HYPERTENSION (ICD-401.9)  The following medications were removed from the medication list:    Diovan Hct 160-12.5 Mg Tabs (Valsartan-hydrochlorothiazide) .Marland Kitchen... Take 1 tablet by mouth once a day His updated medication list for this problem includes:    Losartan Potassium 50 Mg Tabs (Losartan potassium) ..... Once daily  Problem # 5:  RENAL INSUFFICIENCY (ICD-588.9)  Complete Medication List: 1)  Adult Aspirin Low Strength 81 Mg Chew (Aspirin) .... Take 1 tablet by mouth once a  day 2)  Glipizide 10 Mg Tabs (Glipizide) .... One each am 3)  Simvastatin 40 Mg Tabs (Simvastatin) .... Once daily 4)  Neurontin 100 Mg Caps (Gabapentin) .Marland Kitchen.. 1 three times a day and 2 at bedtime 5)  Metformin Hcl 500 Mg Tabs (Metformin hcl) .... Two times a day 6)  Senokot 8.6 Mg Tabs (Sennosides) .... Two hs 7)  Miralax Powd (Polyethylene glycol 3350) .Marland KitchenMarland Kitchen. 17 gms once daily 8)  Cyanocobalamin 1000 Mcg/ml Soln (Cyanocobalamin) .... Monthly 9)  Ferrous Sulfate 300 (60 Fe) Mg/71ml Syrp (Ferrous sulfate) .... One tsp once daily 10)  Losartan Potassium 50 Mg Tabs (Losartan potassium) .... Once daily  Other Orders: Flu Vaccine 46yrs + MEDICARE PATIENTS (V7846) Administration Flu vaccine - MCR (N6295)  Patient Instructions: 1)  Get an A1c today. We refilled his labs and went with generics where possible. Prescriptions: SIMVASTATIN 40 MG TABS (SIMVASTATIN) once daily  #90 x 3   Entered and Authorized by:   Nelwyn Salisbury MD   Signed by:   Nelwyn Salisbury MD on 03/24/2010   Method used:   Print then Give to Patient   RxID:   [id number] GLIPIZIDE 10 MG TABS (GLIPIZIDE) one each am  #90 x 3   Entered and Authorized by:   Nelwyn Salisbury MD   Signed by:   Nelwyn Salisbury MD on 03/24/2010    Method used:   Print then Give to Patient   RxID:   [id number] VFIEPPIR POTASSIUM 50 MG TABS (LOSARTAN POTASSIUM) once daily  #90 x 3   Entered and Authorized by:   Nelwyn Salisbury MD   Signed by:   Nelwyn Salisbury MD on 03/24/2010   Method used:   Print then Give to Patient   RxID:   [id number] MIRALAX  POWD (POLYETHYLENE GLYCOL 3350) 17 gms once daily  #30 x 11   Entered and Authorized by:   Nelwyn Salisbury MD   Signed by:   Nelwyn Salisbury MD on 03/24/2010   Method used:   Electronically to        [facility] [address] [id number]* (retail)       [address]       [place], [place]  [address]       Ph:  [phone] or [phone]       Fax: [phone]   RxID:   [id number] FERROUS SULFATE 300 (60 FE) MG/5ML SYRP (FERROUS SULFATE) one tsp once daily  #30 x 11   Entered and Authorized by:   Nelwyn Salisbury MD   Signed by:   Nelwyn Salisbury MD on 03/24/2010   Method used:   Electronically to        [facility]* (retail)       [address]       [place], [place]  [address]       Ph: [phone] or [phone]       Fax: [phone]   RxID:   [id number]    Medication Administration  Injection # 1:    Medication: Vit B12 1000 mcg    Diagnosis: VITAMIN B12 DEFICIENCY (ICD-266.2)    Route: IM    Site: R deltoid    Exp Date: 12/2011    Lot #: [id number]    Mfr: [employer]    Patient tolerated injection without complications    Given by: Pura Spice, RN (March 24, 2010 10:40 AM)  Orders Added: 1)  Flu Vaccine 72yrs + MEDICARE PATIENTS [Q2039] 2)  Administration Flu vaccine - MCR [G0008] 3)  Vit B12 1000 mcg [J3420] 4)  Admin of Therapeutic Inj  intramuscular or subcutaneous [96372] 5)  Venipuncture [36415] 6)  TLB-A1C / Hgb A1C (Glycohemoglobin) [83036-A1C] 7)  Specimen Handling [99000] 8)  Est. Patient Level IV [73220]

## 2010-08-17 NOTE — Progress Notes (Signed)
Summary: REQ FOR CPX APPT  Phone Note Call from Patient   Caller: Patient [phone] Reason for Call: Talk to Nurse, Talk to Doctor Summary of Call: Pt called to see if there was any way that he could come in for cpx within the next couple of weeks before his medications run out... Pt adv that he needs to come in for a med ck / refill appt but would like to have cpx done at same time as med ck / refill..... Pt adv that his meds will run out within the next couple of weeks and would like to come in for one ov to take care of it all if possible...Marland KitchenMarland KitchenMarland Kitchen Pt can be reached at his home # [phone]  or   his cell # [phone].  Initial call taken by: Debbra Riding,  September 20, 2009 9:56 AM  Follow-up for Phone Call        ok to work in except for Friday or Monday, pt can come in fasting Follow-up by: Alfred Levins, CMA,  September 20, 2009 11:03 AM  Additional Follow-up for Phone Call Additional follow up Details #1::        LMTCB--- Debbra Riding, September 20, 2009 11:54AM.  Phone Call Completed-----Pt has appt on September 27, 2009 @ 10:30am for cpx w/ Dr [name]...Marland KitchenMarland KitchenPt will come in fasting.  Debbra Riding, September 20, 2009 2:56PM  Additional Follow-up by: Debbra Riding,  September 20, 2009 11:54 AM

## 2010-08-17 NOTE — Consult Note (Signed)
Summary: [facility]  [facility]   Imported By: Maryln Gottron 12/01/2009 12:42:16  _____________________________________________________________________  External Attachment:    Type:   Image     Comment:   External Document

## 2010-08-17 NOTE — Miscellaneous (Signed)
Summary: Orders Update  Clinical Lists Changes  Problems: Added new problem of HYPERKALEMIA (ICD-276.7) Orders: Added new Referral order of Nephrology Referral (Nephro) - Signed

## 2010-08-17 NOTE — Assessment & Plan Note (Signed)
Summary: B-12 INJ/CJR  Nurse Visit   Allergies: 1)  Penicillin V Potassium (Penicillin V Potassium)  Medication Administration  Injection # 1:    Medication: Vit B12 1000 mcg    Diagnosis: VITAMIN B12 DEFICIENCY (ICD-266.2)    Route: IM    Site: R deltoid    Exp Date: 09/12    Lot #: [id number]    Mfr: [employer]    Patient tolerated injection without complications    Given by: Raechel Ache, RN (Nov 22, 2009 9:18 AM)  Orders Added: 1)  Vit B12 1000 mcg [J3420] 2)  Admin of Therapeutic Inj  intramuscular or subcutaneous [96045]

## 2010-08-17 NOTE — Letter (Signed)
Summary: [facility]  [facility]   Imported By: Maryln Gottron 10/11/2009 14:57:32  _____________________________________________________________________  External Attachment:    Type:   Image     Comment:   External Document

## 2010-08-17 NOTE — Letter (Signed)
Summary: [facility]  [facility]   Imported By: Maryln Gottron 08/15/2009 10:57:57  _____________________________________________________________________  External Attachment:    Type:   Image     Comment:   External Document

## 2010-08-17 NOTE — Letter (Signed)
Summary: [facility]  [facility]   Imported By: Maryln Gottron 04/04/2010 13:27:34  _____________________________________________________________________  External Attachment:    Type:   Image     Comment:   External Document

## 2010-08-17 NOTE — Assessment & Plan Note (Signed)
Summary: B12 INJ // RS  rt delt   Nurse Visit   Allergies: 1)  Penicillin V Potassium (Penicillin V Potassium)  Medication Administration  Injection # 1:    Medication: Vit B12 1000 mcg    Diagnosis: VITAMIN B12 DEFICIENCY (ICD-266.2)    Route: IM    Site: R deltoid    Exp Date: 01/2012    Lot #: [id number]    Mfr: [employer]    Patient tolerated injection without complications    Given by: Pura Spice, RN (May 26, 2010 9:35 AM)  Orders Added: 1)  Vit B12 1000 mcg [J3420] 2)  Admin of Therapeutic Inj  intramuscular or subcutaneous [16109]

## 2010-08-17 NOTE — Letter (Signed)
Summary: [facility]   [facility]   Imported By: Maryln Gottron 09/12/2009 12:34:12  _____________________________________________________________________  External Attachment:    Type:   Image     Comment:   External Document

## 2010-08-17 NOTE — Letter (Signed)
Summary: [facility]  [facility]   Imported By: Maryln Gottron 06/16/2010 10:00:57  _____________________________________________________________________  External Attachment:    Type:   Image     Comment:   External Document

## 2010-08-17 NOTE — Assessment & Plan Note (Signed)
Summary: CPX (PT WILL COME IN FASTING) // RS   Vital Signs:  Patient profile:   77 year old male Weight:      167 pounds BMI:     22.73 Temp:     97.6 degrees F oral BP sitting:   120 / 62  (left arm) Cuff size:   regular  Vitals Entered By: Raechel Ache, RN (September 27, 2009 10:57 AM) CC: OV, fasting.    History of Present Illness: 77 yr old male for cpx. In general he is feeling well but he has some concerns. he was recently diagnosed with bilateral carpal tunnel syndrome by Dr. Sandria Manly, and he is waiting on the arrival of a pair of wrist splints that he will wear at night. A NCS/EMG revealed this to be carpal tunnel rather than a central cord syndrome fortunately. He saw Dr. [name] yesterday, and he was pleased with his status. He feels that  Mr. Quam has reached his maximum recovery, and he hopes that he can maintain this level. His am fasting glucoses have run from 120 to 175 recently, while his evening readings run from 90 to 140. He would like to check a testosterone level because he has had trouble with erections for the past year.   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 Margo Aye for skin checks sees Dr. Pearlean Brownie for eye exams gait instability, sees Dr. Sandria Manly carpal tunnel, bilateral per Dr. Sandria Manly  Past Surgical History: Reviewed history from 07/01/2008 and no changes required. Tonsillectomy colonoscopy 2-08 per Dr. Randa Evens, normal EGD 2-08, normal removal of part of the scrotum for Paget's disease per Dr. Zachery Dakins 3 level cervical spine fusion per Dr. [name] 10-09  Family History: Reviewed history from 01/29/2008 and no changes required. Tuberculosis  Social History: Reviewed history from 01/29/2008 and no changes required. Married Never  Smoked Alcohol use-no Drug use-no Retired  Review of Systems  The patient denies anorexia, fever, weight loss, weight gain, vision loss, decreased hearing, hoarseness, chest pain, syncope, dyspnea on exertion, peripheral edema, prolonged cough, headaches, hemoptysis, abdominal pain, melena, hematochezia, severe indigestion/heartburn, hematuria, incontinence, genital sores, muscle weakness, suspicious skin lesions, transient blindness, difficulty walking, depression, unusual weight change, abnormal bleeding, enlarged lymph nodes, angioedema, breast masses, and testicular masses.    Physical Exam  General:  Well-developed,well-nourished,in no acute distress; alert,appropriate and cooperative throughout examination Head:  Normocephalic and atraumatic without obvious abnormalities. No apparent alopecia or balding. Eyes:  No corneal or conjunctival inflammation noted. EOMI. Perrla. Funduscopic exam benign, without hemorrhages, exudates or papilledema. Vision grossly normal. Ears:  External ear exam shows no significant lesions or deformities.  Otoscopic examination reveals clear canals, tympanic membranes are intact bilaterally without bulging, retraction, inflammation or discharge. Hearing is grossly normal bilaterally. Nose:  External nasal examination shows no deformity or inflammation. Nasal mucosa are pink and moist without lesions or exudates. Mouth:  Oral mucosa and oropharynx without lesions or exudates.  Teeth in good repair. Neck:  No deformities, masses, or tenderness noted. Chest Wall:  No deformities, masses, tenderness or gynecomastia noted. Lungs:  Normal respiratory effort, chest expands symmetrically. Lungs are clear to auscultation, no crackles or wheezes. Heart:  Normal rate and regular rhythm. S1 and S2 normal without gallop, murmur, click, rub or other extra sounds. EKG normal Abdomen:  Bowel sounds positive,abdomen soft  and non-tender without masses, organomegaly or hernias  noted. Rectal:  No external abnormalities noted. Normal sphincter tone. No rectal masses or tenderness. heme neg. Genitalia:  Testes bilaterally descended without nodularity, tenderness or masses. No scrotal masses or lesions. No penis lesions or urethral discharge. Prostate:  no nodules, no asymmetry, no induration, and 1+ enlarged.   Msk:  No deformity or scoliosis noted of thoracic or lumbar spine.   Pulses:  R and L carotid,radial,femoral,dorsalis pedis and posterior tibial pulses are full and equal bilaterally Extremities:  No clubbing, cyanosis, edema, or deformity noted with normal full range of motion of all joints.   Neurologic:  No cranial nerve deficits noted. Station and gait are normal. Plantar reflexes are down-going bilaterally. DTRs are symmetrical throughout. Sensory, motor and coordinative functions appear intact. Skin:  Intact without suspicious lesions or rashes Cervical Nodes:  No lymphadenopathy noted Axillary Nodes:  No palpable lymphadenopathy Inguinal Nodes:  No significant adenopathy Psych:  Cognition and judgment appear intact. Alert and cooperative with normal attention span and concentration. No apparent delusions, illusions, hallucinations   Impression & Recommendations:  Problem # 1:  CARPAL TUNNEL SYNDROME (ICD-354.0)  Problem # 2:  ERECTILE DYSFUNCTION, ORGANIC (ICD-607.84)  Orders: TLB-PSA (Prostate Specific Antigen) (84153-PSA) TLB-Testosterone, Total (84403-TESTO)  Problem # 3:  NECK PAIN, CHRONIC (ICD-723.1)  The following medications were removed from the medication list:    Flexeril 10 Mg Tabs (Cyclobenzaprine hcl) .Marland Kitchen... 1 by mouth three times a day as needed spasm His updated medication list for this problem includes:    Adult Aspirin Low Strength 81 Mg Chew (Aspirin) .Marland Kitchen... Take 1 tablet by mouth once a  day  Problem # 4:  ANXIETY STATE, UNSPECIFIED (ICD-300.00)  Problem # 5:  HYPERTENSION (ICD-401.9)  His updated medication list for this  problem includes:    Diovan Hct 160-12.5 Mg Tabs (Valsartan-hydrochlorothiazide) .Marland Kitchen... Take 1 tablet by mouth once a day  Orders: EKG w/ Interpretation (93000)  Problem # 6:  RENAL INSUFFICIENCY (ICD-588.9)  Problem # 7:  HYPERLIPIDEMIA (ICD-272.4)  His updated medication list for this problem includes:    Simvastatin 40 Mg Tabs (Simvastatin) ..... Once daily  Problem # 8:  DIABETES MELLITUS, TYPE II (ICD-250.00)  His updated medication list for this problem includes:    Adult Aspirin Low Strength 81 Mg Chew (Aspirin) .Marland Kitchen... Take 1 tablet by mouth once a  day    Diovan Hct 160-12.5 Mg Tabs (Valsartan-hydrochlorothiazide) .Marland Kitchen... Take 1 tablet by mouth once a day    Glipizide 10 Mg Tabs (Glipizide) ..... One each am    Metformin Hcl 500 Mg Tabs (Metformin hcl) .Marland Kitchen..Marland Kitchen Two times a day  Orders: UA Dipstick w/o Micro (automated)  (81003) Venipuncture (11914) TLB-Lipid Panel (80061-LIPID) TLB-BMP (Basic Metabolic Panel-BMET) (80048-METABOL) TLB-CBC Platelet - w/Differential (85025-CBCD) TLB-Hepatic/Liver Function Pnl (80076-HEPATIC) TLB-TSH (Thyroid Stimulating Hormone) (84443-TSH) TLB-Microalbumin/Creat Ratio, Urine (82043-MALB) TLB-A1C / Hgb A1C (Glycohemoglobin) (83036-A1C)  Complete Medication List: 1)  Adult Aspirin Low Strength 81 Mg Chew (Aspirin) .... Take 1 tablet by mouth once a  day 2)  Diovan Hct 160-12.5 Mg Tabs (Valsartan-hydrochlorothiazide) .... Take 1 tablet by mouth once a day 3)  Glipizide 10 Mg Tabs (Glipizide) .... One each am 4)  Simvastatin 40 Mg Tabs (Simvastatin) .... Once daily 5)  Ferrous Fumarate 325 Mg Tabs (Ferrous fumarate) .Marland Kitchen.. 1 by mouth daily 6)  Neurontin 100 Mg Caps (Gabapentin) .Marland Kitchen.. 1 three times a day and 2 at bedtime 7)  Metformin Hcl 500 Mg Tabs (Metformin hcl) .Marland KitchenMarland KitchenMarland Kitchen  Two times a day 8)  Senokot 8.6 Mg Tabs (Sennosides) .... Two hs 9)  Miralax Powd (Polyethylene glycol 3350) .... As needed 10)  Cyanocobalamin 1000 Mcg/ml Soln (Cyanocobalamin) ....  Monthly  Other Orders: Vit B12 1000 mcg (J3420) Admin of Therapeutic Inj  intramuscular or subcutaneous (21308) TLB-B12, Serum-Total ONLY (65784-O96)  Patient Instructions: 1)  Get labs today. Increase Metformin to two times a day .  Prescriptions: METFORMIN HCL 500 MG TABS (METFORMIN HCL) two times a day  #60 x 0   Entered and Authorized by:   Nelwyn Salisbury MD   Signed by:   Nelwyn Salisbury MD on 09/27/2009   Method used:   Electronically to        [facility]* (retail)       [address]       [place], [place]  [address]       Ph: [phone] or [phone]       Fax: [phone]   RxID:   [id number] NEURONTIN 100 MG CAPS (GABAPENTIN) 1 three times a day and 2 at bedtime  #150 x 0   Entered and Authorized by:   Nelwyn Salisbury MD   Signed by:   Nelwyn Salisbury MD on 09/27/2009   Method used:   Electronically to        [facility]* (retail)       [address]       [place], [place]  [address]       Ph: [phone] or [phone]       Fax: [phone]   RxID:   [id number] NEURONTIN 100 MG CAPS (GABAPENTIN) 1 three times a day and 2 at bedtime  #450 x 3   Entered and Authorized by:   Nelwyn Salisbury MD   Signed by:   Nelwyn Salisbury MD on 09/27/2009   Method used:   Print then Give to Patient   RxID:   [id number] METFORMIN HCL 500 MG TABS (METFORMIN HCL) two times a day  #180 x 3   Entered and Authorized by:   Nelwyn Salisbury MD   Signed by:   Nelwyn Salisbury MD on 09/27/2009   Method used:   Print then Give to Patient   RxID:   [id number]    Medication Administration  Injection # 1:    Medication: Vit B12 1000 mcg    Diagnosis: METHYLMALONIC ACIDEMIA (ICD-276.2)    Route: IM    Site: L deltoid    Exp Date: 09/12    Lot #: [id number]    Mfr: American Regent    Patient tolerated injection without complications    Given by: Raechel Ache, RN (September 27, 2009 10:59 AM)  Orders Added: 1)   Vit B12 1000 mcg [J3420] 2)  Admin of Therapeutic Inj  intramuscular or subcutaneous [96372] 3)  Est. Patient Level IV [10258] 4)  UA Dipstick w/o Micro (automated)  [81003] 5)  Venipuncture [36415] 6)  TLB-Lipid Panel [80061-LIPID] 7)  TLB-BMP (Basic Metabolic Panel-BMET) [80048-METABOL] 8)  TLB-CBC Platelet - w/Differential [85025-CBCD] 9)  TLB-Hepatic/Liver Function Pnl [80076-HEPATIC] 10)  TLB-TSH (Thyroid Stimulating Hormone) [84443-TSH] 11)  TLB-Microalbumin/Creat Ratio, Urine [82043-MALB] 12)  TLB-A1C / Hgb A1C (Glycohemoglobin) [83036-A1C] 13)  TLB-PSA (Prostate Specific Antigen) [84153-PSA] 14)  TLB-B12, Serum-Total ONLY [82607-B12] 15)  TLB-Testosterone, Total [84403-TESTO] 16)  EKG w/ Interpretation [93000]  Appended Document: CPX (PT WILL COME IN FASTING) // RS  Laboratory Results   Urine Tests    Routine Urinalysis   Color: yellow Appearance: Clear Glucose: negative   (Normal Range: Negative) Bilirubin: negative   (Normal Range: Negative) Ketone: negative   (Normal Range: Negative) Spec. Gravity: 1.015   (Normal Range: 1.003-1.035) Blood: negative   (Normal Range: Negative) pH: 7.0   (Normal Range: 5.0-8.0) Protein: negative   (Normal Range: Negative) Urobilinogen: 0.2   (Normal Range: 0-1) Nitrite: negative   (Normal Range: Negative) Leukocyte Esterace: negative   (Normal Range: Negative)    Comments: Rita Ohara  September 27, 2009 1:25 PM

## 2010-08-17 NOTE — Letter (Signed)
Summary: [facility]  [facility]   Imported By: Maryln Gottron 12/19/2009 10:02:41  _____________________________________________________________________  External Attachment:    Type:   Image     Comment:   External Document

## 2010-08-17 NOTE — Assessment & Plan Note (Signed)
Summary: B12 INJ // RS  Nurse Visit   Allergies: 1)  Penicillin V Potassium (Penicillin V Potassium)  Medication Administration  Injection # 1:    Medication: Vit B12 1000 mcg    Diagnosis: ANEMIA-NOS (ICD-285.9)    Route: IM    Site: L deltoid    Exp Date: 8/12    Lot #: [id number]    Mfr: American Regent    Patient tolerated injection without complications    Given by: Alfred Levins, CMA (July 25, 2009 2:55 PM)  Orders Added: 1)  Vit B12 1000 mcg [J3420] 2)  Admin of Therapeutic Inj  intramuscular or subcutaneous [09811]

## 2010-08-17 NOTE — Letter (Signed)
Summary: [facility]  [facility]   Imported By: Maryln Gottron 10/06/2009 13:58:13  _____________________________________________________________________  External Attachment:    Type:   Image     Comment:   External Document

## 2010-08-17 NOTE — Progress Notes (Signed)
Summary: Rx  Phone Note From Pharmacy   Caller: [facility] [phone]* Call For: Dr [name]  Summary of Call: Has been taking Miralax OTC- wants Rx so ins will cover Initial call taken by: Raechel Ache, RN,  November 10, 2009 9:44 AM  Follow-up for Phone Call        call in Miralax 17 grams once daily for one year Follow-up by: Nelwyn Salisbury MD,  November 10, 2009 11:51 AM  Additional Follow-up for Phone Call Additional follow up Details #1::        Pharmacist called    New/Updated Medications: MIRALAX  POWD (POLYETHYLENE GLYCOL 3350) 17 gms once daily Prescriptions: MIRALAX  POWD (POLYETHYLENE GLYCOL 3350) 17 gms once daily  #527 gms x 4   Entered by:   Raechel Ache, RN   Authorized by:   Nelwyn Salisbury MD   Signed by:   Raechel Ache, RN on 11/10/2009   Method used:   Electronically to        [facility] (retail)       [address]       [place], [place]  [address]       Ph: [phone] or [phone]       Fax: [phone]   RxID:   [id number]

## 2010-08-17 NOTE — Letter (Signed)
Summary: [facility]  [facility]   Imported By: Maryln Gottron 04/18/2010 11:11:55  _____________________________________________________________________  External Attachment:    Type:   Image     Comment:   External Document

## 2010-08-23 NOTE — Letter (Signed)
Summary: [facility]  [facility]   Imported By: Maryln Gottron 08/15/2010 09:34:13  _____________________________________________________________________  External Attachment:    Type:   Image     Comment:   External Document

## 2010-08-24 ENCOUNTER — Telehealth: Payer: Self-pay | Admitting: Family Medicine

## 2010-08-24 NOTE — Telephone Encounter (Signed)
Andrew Norton ok to give appt tomorrow

## 2010-08-24 NOTE — Telephone Encounter (Signed)
Pt has been sch for ov tomorrow 08/25/10 at 10:30am, as noted.

## 2010-08-24 NOTE — Telephone Encounter (Signed)
Pt is having pain in rt side and is req a work in appt for tomorrow a.m. To see Dr [name] and to get a b-12 inj. Pls advise.

## 2010-08-25 ENCOUNTER — Ambulatory Visit (INDEPENDENT_AMBULATORY_CARE_PROVIDER_SITE_OTHER)
Admission: RE | Admit: 2010-08-25 | Discharge: 2010-08-25 | Disposition: A | Discharge status: Home / Self Care | Payer: Medicare Other | Source: Ambulatory Visit | Attending: Family Medicine | Admitting: Family Medicine

## 2010-08-25 ENCOUNTER — Ambulatory Visit (INDEPENDENT_AMBULATORY_CARE_PROVIDER_SITE_OTHER): Payer: Medicare Other | Admitting: Family Medicine

## 2010-08-25 ENCOUNTER — Telehealth: Payer: Self-pay

## 2010-08-25 ENCOUNTER — Encounter: Payer: Self-pay | Admitting: Family Medicine

## 2010-08-25 VITALS — BP 130/70 | Temp 98.4°F | Wt 173.0 lb

## 2010-08-25 DIAGNOSIS — R0781 Pleurodynia: Secondary | ICD-10-CM

## 2010-08-25 DIAGNOSIS — R079 Chest pain, unspecified: Secondary | ICD-10-CM

## 2010-08-25 DIAGNOSIS — D649 Anemia, unspecified: Secondary | ICD-10-CM

## 2010-08-25 MED ORDER — CYANOCOBALAMIN 1000 MCG/ML IJ SOLN
1000.0000 ug | Freq: Once | INTRAMUSCULAR | Status: AC
  Administered 2010-08-25: 1000 ug via INTRAMUSCULAR

## 2010-08-25 NOTE — Telephone Encounter (Signed)
Message copied by Madison Hickman on Fri Aug 25, 2010  4:39 PM ------      Message from: Dwaine Deter      Created: Fri Aug 25, 2010  4:29 PM       He does have 2 fractured ribs on the right side. Rest, use heat. Take Motrin prn . Have him follow up with me in 2 weeks.

## 2010-08-25 NOTE — Progress Notes (Signed)
  Subjective:    Patient ID: Andrew Norton, male    DOB: 1927-01-10, 77 y.o.   MRN: [id number]  HPI Here for 2 weeks of constant sharp pains in the right lower ribs. It hurts to take a deep breath, but there is no SOB and no cough. No recent falls or trauma.    Review of Systems  Constitutional: Negative.   Respiratory: Negative for apnea, cough, choking, chest tightness, shortness of breath, wheezing and stridor.   Cardiovascular: Positive for chest pain. Negative for palpitations and leg swelling.       Objective:   Physical Exam  Constitutional: He appears well-developed and well-nourished. No distress.  Cardiovascular: Normal rate, regular rhythm, normal heart sounds and intact distal pulses.  Exam reveals no gallop and no friction rub.   No murmur heard. Pulmonary/Chest: Effort normal and breath sounds normal. No respiratory distress. He has no wheezes. He has no rales. He exhibits tenderness.       Tender over the right lower rib margin anteriorly . No crepitus          Assessment & Plan:  This is consistent with rib pain, probably a strain where the rectus muscles attach. Use heat and Motrin, rest. We will get rib Xrays today

## 2010-08-25 NOTE — Telephone Encounter (Signed)
Pt aware of xr ribs

## 2010-09-21 ENCOUNTER — Ambulatory Visit (INDEPENDENT_AMBULATORY_CARE_PROVIDER_SITE_OTHER): Payer: Medicare Other | Admitting: Family Medicine

## 2010-09-21 DIAGNOSIS — E538 Deficiency of other specified B group vitamins: Secondary | ICD-10-CM

## 2010-09-21 MED ORDER — CYANOCOBALAMIN 1000 MCG/ML IJ SOLN
1000.0000 ug | Freq: Once | INTRAMUSCULAR | Status: AC
  Administered 2010-09-21: 1000 ug via INTRAMUSCULAR

## 2010-10-18 ENCOUNTER — Encounter: Payer: Self-pay | Admitting: Family Medicine

## 2010-10-23 ENCOUNTER — Other Ambulatory Visit: Payer: Self-pay | Admitting: Neurosurgery

## 2010-10-23 ENCOUNTER — Other Ambulatory Visit: Payer: Self-pay

## 2010-10-23 DIAGNOSIS — M542 Cervicalgia: Secondary | ICD-10-CM

## 2010-10-23 MED ORDER — GABAPENTIN 100 MG PO CAPS: ORAL_CAPSULE | ORAL | Status: DC

## 2010-10-23 MED ORDER — METFORMIN HCL 500 MG PO TABS: 500.0000 mg | ORAL_TABLET | Freq: Two times a day (BID) | ORAL | Status: DC

## 2010-10-23 NOTE — Telephone Encounter (Signed)
rx faxed to [facility] for neurontin 1 capsule tid and 2 at bedtime and  rx metformin 500mg  1 bid .

## 2010-10-24 ENCOUNTER — Ambulatory Visit (INDEPENDENT_AMBULATORY_CARE_PROVIDER_SITE_OTHER): Payer: Medicare Other | Admitting: Family Medicine

## 2010-10-24 DIAGNOSIS — D518 Other vitamin B12 deficiency anemias: Secondary | ICD-10-CM

## 2010-10-24 DIAGNOSIS — D519 Vitamin B12 deficiency anemia, unspecified: Secondary | ICD-10-CM

## 2010-10-24 MED ORDER — CYANOCOBALAMIN 1000 MCG/ML IJ SOLN
1000.0000 ug | Freq: Once | INTRAMUSCULAR | Status: AC
  Administered 2010-10-24: 1000 ug via INTRAMUSCULAR

## 2010-10-26 ENCOUNTER — Ambulatory Visit
Admission: RE | Admit: 2010-10-26 | Discharge: 2010-10-26 | Disposition: A | Discharge status: Home / Self Care | Payer: Medicare Other | Source: Ambulatory Visit | Attending: Neurosurgery | Admitting: Neurosurgery

## 2010-10-26 DIAGNOSIS — M542 Cervicalgia: Secondary | ICD-10-CM

## 2010-10-31 NOTE — Progress Notes (Signed)
done

## 2010-11-06 ENCOUNTER — Encounter: Payer: Self-pay | Admitting: Family Medicine

## 2010-11-06 ENCOUNTER — Ambulatory Visit (INDEPENDENT_AMBULATORY_CARE_PROVIDER_SITE_OTHER): Payer: Medicare Other | Admitting: Family Medicine

## 2010-11-06 VITALS — BP 164/82 | HR 61 | Temp 98.1°F | Resp 14 | Wt 174.0 lb

## 2010-11-06 DIAGNOSIS — M5430 Sciatica, unspecified side: Secondary | ICD-10-CM

## 2010-11-06 MED ORDER — IBUPROFEN 800 MG PO TABS: 800.0000 mg | ORAL_TABLET | Freq: Three times a day (TID) | ORAL | Status: AC | PRN

## 2010-11-06 NOTE — Progress Notes (Signed)
  Subjective:    Patient ID: Andrew Norton, male    DOB: 31-Jan-1927, 77 y.o.   MRN: [id number]  HPI Here for 5 days of sharp pains in the right lower back which radiate down the back of the right leg to the foot. No numbness or tingling, but the leg gets weak at times. He feels like it may give way beneath him at times. Not taking anything for pain other than his usual Gabapentin. No recent trauma.    Review of Systems  Constitutional: Negative.   Gastrointestinal: Negative.   Genitourinary: Negative.   Musculoskeletal: Positive for back pain.       Objective:   Physical Exam  Constitutional: He appears well-developed and well-nourished.       Walks with his cane   Abdominal: Soft. Bowel sounds are normal. There is no tenderness. There is no guarding.  Musculoskeletal:       Tender in the right lower back and over the right sciatic notch. Full ROM. Negative SLR. The right hip shows full ROM          Assessment & Plan:  Sounds like sciatica. Rest , heat. He is already on Gabapentin. Add Motrin.

## 2010-11-07 ENCOUNTER — Telehealth: Payer: Self-pay | Admitting: *Deleted

## 2010-11-07 ENCOUNTER — Encounter: Payer: Self-pay | Admitting: Family Medicine

## 2010-11-07 ENCOUNTER — Ambulatory Visit (INDEPENDENT_AMBULATORY_CARE_PROVIDER_SITE_OTHER)
Admission: RE | Admit: 2010-11-07 | Discharge: 2010-11-07 | Disposition: A | Discharge status: Home / Self Care | Payer: Medicare Other | Source: Ambulatory Visit | Attending: Family Medicine | Admitting: Family Medicine

## 2010-11-07 ENCOUNTER — Ambulatory Visit (INDEPENDENT_AMBULATORY_CARE_PROVIDER_SITE_OTHER): Payer: Medicare Other | Admitting: Family Medicine

## 2010-11-07 VITALS — BP 180/78 | HR 65 | Temp 97.9°F | Resp 14 | Wt 177.0 lb

## 2010-11-07 DIAGNOSIS — M545 Low back pain: Secondary | ICD-10-CM

## 2010-11-07 DIAGNOSIS — M25559 Pain in unspecified hip: Secondary | ICD-10-CM

## 2010-11-07 DIAGNOSIS — M25551 Pain in right hip: Secondary | ICD-10-CM

## 2010-11-07 MED ORDER — OXYCODONE-ACETAMINOPHEN 10-325 MG PO TABS: 1.0000 | ORAL_TABLET | Freq: Four times a day (QID) | ORAL | Status: DC | PRN

## 2010-11-07 NOTE — Telephone Encounter (Signed)
Pt's back is much worse today, and cannot walk.  Wants to speak to [facility] or Dr. [name] ASAP only.

## 2010-11-07 NOTE — Progress Notes (Signed)
  Subjective:    Patient ID: Andrew Norton, male    DOB: 1927-06-25, 77 y.o.   MRN: [id number]  HPI Here for worsening right lower back and right leg pain. We saw him for this yesterday and tried some 800 mg Motrin for it. However this does not touch the pain, and he had a very difficult night last night. He is scheduled to see Dr. [name] tomorrow to follow up on his neck surgery.    Review of Systems  Constitutional: Negative.   Respiratory: Negative.   Cardiovascular: Negative.   Musculoskeletal: Positive for back pain and gait problem.       Objective:   Physical Exam  Constitutional:       Walks with his cane, in pain, alert  Musculoskeletal:       Very tender in the right lower back and sciatic area with some spasm           Assessment & Plan:  We will treat the pain with a Toradol shot and some Percocet. Get plain films of the lumbar spine and hip today. When he sees Dr. [name] tomorrow he will ask him to evaluate the lower spine as well.

## 2010-11-07 NOTE — Telephone Encounter (Signed)
He was seen as an OV today

## 2010-11-08 MED ORDER — KETOROLAC TROMETHAMINE 60 MG/2ML IM SOLN
60.0000 mg | Freq: Once | INTRAMUSCULAR | Status: AC
  Administered 2010-11-07: 60 mg via INTRAMUSCULAR

## 2010-11-08 NOTE — Progress Notes (Signed)
Addended by: Burnard Leigh on: 11/08/2010 12:27 PM   Modules accepted: Orders

## 2010-11-09 ENCOUNTER — Ambulatory Visit
Admission: RE | Admit: 2010-11-09 | Discharge: 2010-11-09 | Disposition: A | Discharge status: Home / Self Care | Payer: Medicare Other | Source: Ambulatory Visit | Attending: Neurosurgery | Admitting: Neurosurgery

## 2010-11-09 ENCOUNTER — Other Ambulatory Visit: Payer: Self-pay | Admitting: Neurosurgery

## 2010-11-09 DIAGNOSIS — M549 Dorsalgia, unspecified: Secondary | ICD-10-CM

## 2010-11-10 ENCOUNTER — Inpatient Hospital Stay (HOSPITAL_COMMUNITY)
Admission: EM | Admit: 2010-11-10 | Discharge: 2010-11-17 | DRG: 552 | Disposition: A | Discharge status: Skilled Nursing Facility | Payer: Medicare Other | Attending: Emergency Medicine | Admitting: Emergency Medicine

## 2010-11-10 DIAGNOSIS — Z981 Arthrodesis status: Secondary | ICD-10-CM

## 2010-11-10 DIAGNOSIS — R1310 Dysphagia, unspecified: Secondary | ICD-10-CM | POA: Diagnosis present

## 2010-11-10 DIAGNOSIS — E1149 Type 2 diabetes mellitus with other diabetic neurological complication: Secondary | ICD-10-CM | POA: Diagnosis present

## 2010-11-10 DIAGNOSIS — E785 Hyperlipidemia, unspecified: Secondary | ICD-10-CM | POA: Diagnosis present

## 2010-11-10 DIAGNOSIS — R269 Unspecified abnormalities of gait and mobility: Secondary | ICD-10-CM | POA: Diagnosis present

## 2010-11-10 DIAGNOSIS — E1142 Type 2 diabetes mellitus with diabetic polyneuropathy: Secondary | ICD-10-CM | POA: Diagnosis present

## 2010-11-10 DIAGNOSIS — Z79899 Other long term (current) drug therapy: Secondary | ICD-10-CM

## 2010-11-10 DIAGNOSIS — N390 Urinary tract infection, site not specified: Secondary | ICD-10-CM | POA: Diagnosis present

## 2010-11-10 DIAGNOSIS — M47817 Spondylosis without myelopathy or radiculopathy, lumbosacral region: Principal | ICD-10-CM | POA: Diagnosis present

## 2010-11-10 DIAGNOSIS — K5900 Constipation, unspecified: Secondary | ICD-10-CM | POA: Diagnosis present

## 2010-11-10 DIAGNOSIS — M6282 Rhabdomyolysis: Secondary | ICD-10-CM | POA: Diagnosis present

## 2010-11-10 DIAGNOSIS — Z880 Allergy status to penicillin: Secondary | ICD-10-CM

## 2010-11-10 DIAGNOSIS — I10 Essential (primary) hypertension: Secondary | ICD-10-CM | POA: Diagnosis present

## 2010-11-10 DIAGNOSIS — Z7982 Long term (current) use of aspirin: Secondary | ICD-10-CM

## 2010-11-10 LAB — CK TOTAL AND CKMB (NOT AT ARMC)
CK, MB: 7.6 ng/mL (ref 0.3–4.0)
Total CK: 345 U/L — ABNORMAL HIGH (ref 7–232)

## 2010-11-10 LAB — DIFFERENTIAL
Basophils Absolute: 0 10*3/uL (ref 0.0–0.1)
Eosinophils Absolute: 0 10*3/uL (ref 0.0–0.7)
Eosinophils Relative: 0 % (ref 0–5)
Lymphocytes Relative: 20 % (ref 12–46)
Neutrophils Relative %: 72 % (ref 43–77)

## 2010-11-10 LAB — URINALYSIS, ROUTINE W REFLEX MICROSCOPIC
Nitrite: NEGATIVE
Specific Gravity, Urine: 1.016 (ref 1.005–1.030)
Urobilinogen, UA: 0.2 mg/dL (ref 0.0–1.0)

## 2010-11-10 LAB — GLUCOSE, CAPILLARY: Glucose-Capillary: 125 mg/dL — ABNORMAL HIGH (ref 70–99)

## 2010-11-10 LAB — BASIC METABOLIC PANEL
GFR calc non Af Amer: >60 mL/min (ref >60)
Potassium: 4.6 mEq/L (ref 3.5–5.1)
Sodium: 135 mEq/L (ref 135–145)

## 2010-11-10 LAB — CBC
Platelets: 205 10*3/uL (ref 150–400)
RDW: 12.9 % (ref 11.5–15.5)
WBC: 13.4 10*3/uL — ABNORMAL HIGH (ref 4.0–10.5)

## 2010-11-10 LAB — HEMOGLOBIN A1C: Mean Plasma Glucose: 157 mg/dL — ABNORMAL HIGH (ref <117)

## 2010-11-10 NOTE — H&P (Signed)
Andrew Norton, Andrew Norton NO.:  [id number]  MEDICAL RECORD NO.:  [id number]           PATIENT TYPE:  E  LOCATION:  [facility]                         FACILITY:  [facility]  PHYSICIAN:  Standley Dakins, MD   DATE OF BIRTH:  05/30/1927  DATE OF ADMISSION:  11/10/2010 DATE OF DISCHARGE:                             HISTORY & PHYSICAL   PRIMARY CARE PHYSICIAN:  Dr. [name].  PRIMARY NEUROSURGEON:  Dr. [name].  CHIEF COMPLAINT:  Lower back pain and swallowing difficulties.  HISTORY OF PRESENT ILLNESS:  This patient is a pleasant 78 year old gentleman with chronic degenerative changes in the cervical and lumbar spine who presented to the Emergency Department today complaining of 1 week of progressive severe lower back pain radiating into the ankles and feet.  The patient reports that symptoms started 1 week ago and started to affect his ability to have regular bowel movements and he was having more difficulty with urination as well.  He reports that his pain became 7 out of 10.    The patient reports that he describes the pain in the lower back as a severe shooting pain that radiates into the buttocks on both sides and into the legs and feet.  The patient reports that this has not been associated with any particular injury or fall.  The patient had seen his primary care provider and started on a course of steroids yesterday, but no significant improvement.  The patient had an MRI study that was done yesterday.  The patient came into the Emergency Department because he could not take the pain any longer.  The patient also reports that he is having a recurrence of difficulty with swallowing.  The patient has had chronic swallowing problems since he had a cervical fusion in 2009.  The patient reports that he describes the swallowing problems as a "restrictive" sensation in back of the throat.  The patient has been able to eat and drink, but reports that he has been limiting his  portions since 2009.  The patient reports that he can only take small bites at a time.    The patient's MRI report wasreviewed and he has significant foraminal  narrowing in the spinal column especially in the lower lumbar spine.  The patient's neurosurgeon Dr. [name] was called and recommended that  the patient be admitted for pain management and they will be following  him in the hospital as well.  The patient will also need some physical  therapy and rehabilitation, as he hasstarted to report difficulty with  ambulation and started using a four-point walker 2 days ago for stability  to prevent a serious fall.  PAST MEDICAL HISTORY: 1. 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  He underwent a fusion     of the cervical vertebrae in October 2009 by Dr. Alfredia Ferguson.  The     patient reported that he had a 3-week hospitalization and a 4-week     rehab stay after that surgery. 2. Positive for Paget's disease (involving left  groin). 3. Hypertension. 4. Noninsulin-requiring type 2 diabetes mellitus. 5. Diabetic peripheral polyneuropathy. 6. Hyperlipidemia. 7. Chronic anemia. 8. Hard of hearing  PAST SURGICAL HISTORY: 1. Left groin surgery, 2005. 2. Cervical fusion in October 2009 as mentioned above.  FAMILY HISTORY:  The patient denies a history of diabetes, hypertension, cancer, thyroid disease, or stroke.  SOCIAL HISTORY:  The patient reports that he lives with his wife in [place], [place].  He does not use tobacco, alcohol, or recreational drugs.  He reports that he has been able to maintain his activities of daily living up until 1 week ago when he experienced some difficulty with gait and ambulation related to the lower back pain.  MEDICATIONS:  Home medications, 1. Metformin 500 mg p.o. b.i.d. 2. Glipizide 10 mg one p.o. q.a.m. 3. Losartan 50 mg q.a.m. 4. Aspirin 81 mg p.o. daily. 5.  Neurontin 100 mg 1 p.o. t.i.d. and 2 p.o. at bedtime. 6. Simvastatin 40 mg p.o. at bedtime. 7. MiraLax 17 g p.o. daily. 8. Ibuprofen 800 mg 1 p.o. q.8 h. 9. Percocet 10/325 one p.o. q.6 h. 10.Iron sulfate liquid (strength unknown) 1 teaspoon p.o. at bedtime.  ALLERGIES:  PENICILLIN.  REVIEW OF SYSTEMS:  Significant for constipation and difficulty urinating, severe lower back pain.  Please see HPI, otherwise all systems reviewed completely and reported as negative.  PHYSICAL EXAMINATION:  VITAL SIGNS: Temperature 97.9, pulse 67, respirations 20, blood pressure 149/78, and oxygen saturation 97% on room air. GENERAL:  This is an elderly gentleman.  He is lying in a fetal position on the exam bed. CONSTITUTIONAL:  Appearance consistent with age of record. HEENT:  Eyes, normal apperance.  No scleral icterus noted.  No rhinorrhea.  Mucous membranes moist. NECK: exaggerated curve from c-spine fusion.  No thyromegaly.   No carotid bruits.  Trachea midline. CERVICAL SPINE:  Nontender, but fused. THORACIC SPINE:  Tender in the lumbar areas. CARDIOVASCULAR:  Normal S1, S2 sounds without murmurs, rubs, or gallops. LUNGS:  Bilateral breath sounds.  Clear to auscultation.  No crackles, wheezes, or rhonchi. ABDOMEN:  Soft, nondistended, nontender, no masses.  Bowel sounds present and active. EXTREMITIES:  No pretibial edema, cyanosis, or clubbing.  Full range of motion.  No deformities noted. NEUROLOGICAL:  Normal speech.  Awake, alert, and oriented x3. SKIN:  No gross lesions noted on exam.  LABORATORY DATA:  Hemoglobin 11.8, hematocrit 33.3, white blood cell count 13.4, and platelet count 205.  Sodium 135, potassium 4.6, chloride 103, bicarb 24, BUN 20, creatinine 1.13, and estimated GFR greater than 60.  IMAGING DATA:  Examination of the MRI of the lumbar spine without contrast reveals old healed endplate compression deformities at L1 and L2.  No acute fractures noted.  Bulging of the  T12-L1 disk and multilevel bulging of the lumbar disks and some lateral recess narrowing at L2, L3, L3-L4, and L4-L5 and there is mild narrowing of these subarticular and lateral recesses and a neural foramina and L5-S1.  IMPRESSION: 1. This is an 77 year old gentleman with intractable low back pain     related to foraminal narrowing in the lumbar spine.  The patient is     being admitted for pain management and subspecialty care. 2. History of dysphasia. 3. Hypertension. 4. Diabetes mellitus type 2, non-insulin requiring. 5. Constipation. 6. Difficulty with urination. 7. Hyperlipidemia. 8. Diabetic peripheral polyneuropathy. 9. Gait instability. 10. Dysphagia  COMMENTS AND RECOMMENDATIONS AND PLAN: 1. The patient is being admitted into the hospital into medical bed  for pain management and for Neurosurgery consultation. 2. Resume home medications for hypertension and diabetes mellitus and     hyperlipidemia. 3. I have requested a swallow evaluation. 4. IV morphine as needed for pain management. 5. Consult Physical Therapy and Occupational Therapy for evaluation     and management recommendations. 6. Prednisone has been discontinued. 7. Continue to monitor the patient closely.  Follow metabolic panel     and CBC and make adjustments to medical care as required.     Standley Dakins, MD     CJ/MEDQ  D:  11/10/2010  T:  11/10/2010  Job:  [id number]  Electronically Signed by Standley Dakins  on 11/10/2010 06:33:02 PM

## 2010-11-11 LAB — COMPREHENSIVE METABOLIC PANEL
ALT: 18 U/L (ref 0–53)
AST: 35 U/L (ref 0–37)
CO2: 26 mEq/L (ref 19–32)
Calcium: 8.4 mg/dL (ref 8.4–10.5)
GFR calc Af Amer: >60 mL/min (ref >60)
GFR calc non Af Amer: >60 mL/min (ref >60)
Sodium: 133 mEq/L — ABNORMAL LOW (ref 135–145)
Total Protein: 6.4 g/dL (ref 6.0–8.3)

## 2010-11-11 LAB — LIPID PANEL
HDL: 44 mg/dL (ref >39)
Total CHOL/HDL Ratio: 2.1 RATIO
VLDL: 18 mg/dL (ref 0–40)

## 2010-11-11 LAB — GLUCOSE, CAPILLARY
Glucose-Capillary: 98 mg/dL (ref 70–99)
Glucose-Capillary: 183 mg/dL — ABNORMAL HIGH (ref 70–99)

## 2010-11-11 LAB — CBC
MCH: 30.8 pg (ref 26.0–34.0)
MCHC: 33.8 g/dL (ref 30.0–36.0)
MCV: 90.9 fL (ref 78.0–100.0)
Platelets: 187 10*3/uL (ref 150–400)
RDW: 13.1 % (ref 11.5–15.5)

## 2010-11-11 LAB — CARDIAC PANEL(CRET KIN+CKTOT+MB+TROPI)
Total CK: 533 U/L — ABNORMAL HIGH (ref 7–232)
Total CK: 499 U/L — ABNORMAL HIGH (ref 7–232)
Troponin I: 0.01 ng/mL (ref 0.00–0.06)

## 2010-11-12 LAB — MAGNESIUM: Magnesium: 2.2 mg/dL (ref 1.5–2.5)

## 2010-11-12 LAB — CK TOTAL AND CKMB (NOT AT ARMC)
CK, MB: 4.3 ng/mL — ABNORMAL HIGH (ref 0.3–4.0)
Relative Index: 1.5 (ref 0.0–2.5)
Total CK: 281 U/L — ABNORMAL HIGH (ref 7–232)

## 2010-11-12 LAB — BASIC METABOLIC PANEL
CO2: 27 mEq/L (ref 19–32)
Chloride: 102 mEq/L (ref 96–112)
GFR calc Af Amer: >60 mL/min (ref >60)
Potassium: 4.1 mEq/L (ref 3.5–5.1)
Sodium: 133 mEq/L — ABNORMAL LOW (ref 135–145)

## 2010-11-12 LAB — DIFFERENTIAL
Basophils Relative: 0 % (ref 0–1)
Eosinophils Absolute: 0.1 10*3/uL (ref 0.0–0.7)
Monocytes Relative: 10 % (ref 3–12)
Neutro Abs: 8 10*3/uL — ABNORMAL HIGH (ref 1.7–7.7)
Neutrophils Relative %: 76 % (ref 43–77)

## 2010-11-12 LAB — CBC
Hemoglobin: 10.1 g/dL — ABNORMAL LOW (ref 13.0–17.0)
Platelets: 178 10*3/uL (ref 150–400)
RBC: 3.35 MIL/uL — ABNORMAL LOW (ref 4.22–5.81)
WBC: 10.5 10*3/uL (ref 4.0–10.5)

## 2010-11-12 LAB — GLUCOSE, CAPILLARY
Glucose-Capillary: 173 mg/dL — ABNORMAL HIGH (ref 70–99)
Glucose-Capillary: 132 mg/dL — ABNORMAL HIGH (ref 70–99)

## 2010-11-12 LAB — PHOSPHORUS: Phosphorus: 3.4 mg/dL (ref 2.3–4.6)

## 2010-11-13 ENCOUNTER — Inpatient Hospital Stay (HOSPITAL_COMMUNITY): Payer: Medicare Other

## 2010-11-13 LAB — GLUCOSE, CAPILLARY: Glucose-Capillary: 96 mg/dL (ref 70–99)

## 2010-11-13 LAB — BASIC METABOLIC PANEL
BUN: 13 mg/dL (ref 6–23)
Creatinine, Ser: 0.74 mg/dL (ref 0.4–1.5)
GFR calc non Af Amer: >60 mL/min (ref >60)
Glucose, Bld: 161 mg/dL — ABNORMAL HIGH (ref 70–99)
Potassium: 4.6 mEq/L (ref 3.5–5.1)

## 2010-11-13 LAB — DIFFERENTIAL
Eosinophils Relative: 4 % (ref 0–5)
Lymphocytes Relative: 28 % (ref 12–46)
Lymphs Abs: 1.9 10*3/uL (ref 0.7–4.0)
Monocytes Absolute: 0.7 10*3/uL (ref 0.1–1.0)
Monocytes Relative: 10 % (ref 3–12)

## 2010-11-13 LAB — PROTIME-INR: INR: 1.09 (ref 0.00–1.49)

## 2010-11-13 LAB — CBC
HCT: 30.7 % — ABNORMAL LOW (ref 39.0–52.0)
MCH: 30.9 pg (ref 26.0–34.0)
MCV: 91.1 fL (ref 78.0–100.0)
RDW: 12.9 % (ref 11.5–15.5)
WBC: 6.9 10*3/uL (ref 4.0–10.5)

## 2010-11-13 MED ORDER — IOHEXOL 180 MG/ML  SOLN
10.0000 mL | Freq: Once | INTRAMUSCULAR | Status: AC | PRN
  Administered 2010-11-13: 2 mL via INTRAVENOUS

## 2010-11-14 LAB — GLUCOSE, CAPILLARY: Glucose-Capillary: 186 mg/dL — ABNORMAL HIGH (ref 70–99)

## 2010-11-14 LAB — BASIC METABOLIC PANEL
Calcium: 8.4 mg/dL (ref 8.4–10.5)
Chloride: 99 mEq/L (ref 96–112)
Creatinine, Ser: 0.85 mg/dL (ref 0.4–1.5)
GFR calc Af Amer: >60 mL/min (ref >60)

## 2010-11-14 LAB — DIFFERENTIAL
Lymphocytes Relative: 11 % — ABNORMAL LOW (ref 12–46)
Lymphs Abs: 0.9 10*3/uL (ref 0.7–4.0)
Neutrophils Relative %: 83 % — ABNORMAL HIGH (ref 43–77)

## 2010-11-14 LAB — CBC
MCH: 31.5 pg (ref 26.0–34.0)
MCHC: 35.5 g/dL (ref 30.0–36.0)
Platelets: 210 10*3/uL (ref 150–400)
RBC: 3.36 MIL/uL — ABNORMAL LOW (ref 4.22–5.81)

## 2010-11-14 LAB — MAGNESIUM: Magnesium: 2 mg/dL (ref 1.5–2.5)

## 2010-11-14 LAB — PHOSPHORUS: Phosphorus: 3.7 mg/dL (ref 2.3–4.6)

## 2010-11-15 ENCOUNTER — Inpatient Hospital Stay (HOSPITAL_COMMUNITY): Payer: Medicare Other

## 2010-11-15 LAB — GLUCOSE, CAPILLARY
Glucose-Capillary: 174 mg/dL — ABNORMAL HIGH (ref 70–99)
Glucose-Capillary: 152 mg/dL — ABNORMAL HIGH (ref 70–99)

## 2010-11-16 LAB — CBC
HCT: 32.1 % — ABNORMAL LOW (ref 39.0–52.0)
MCV: 89.4 fL (ref 78.0–100.0)
RBC: 3.59 MIL/uL — ABNORMAL LOW (ref 4.22–5.81)
WBC: 7.2 10*3/uL (ref 4.0–10.5)

## 2010-11-16 LAB — BASIC METABOLIC PANEL
CO2: 28 mEq/L (ref 19–32)
Calcium: 9.1 mg/dL (ref 8.4–10.5)
Creatinine, Ser: 0.84 mg/dL (ref 0.4–1.5)
GFR calc Af Amer: >60 mL/min (ref >60)
GFR calc non Af Amer: >60 mL/min (ref >60)
Sodium: 133 mEq/L — ABNORMAL LOW (ref 135–145)

## 2010-11-16 LAB — GLUCOSE, CAPILLARY
Glucose-Capillary: 64 mg/dL — ABNORMAL LOW (ref 70–99)
Glucose-Capillary: 228 mg/dL — ABNORMAL HIGH (ref 70–99)

## 2010-11-16 LAB — HEMOCCULT GUIAC POC 1CARD (OFFICE): Fecal Occult Bld: NEGATIVE

## 2010-11-17 LAB — GLUCOSE, CAPILLARY
Glucose-Capillary: 193 mg/dL — ABNORMAL HIGH (ref 70–99)
Glucose-Capillary: 167 mg/dL — ABNORMAL HIGH (ref 70–99)

## 2010-11-24 ENCOUNTER — Inpatient Hospital Stay (HOSPITAL_COMMUNITY)
Admission: EM | Admit: 2010-11-24 | Discharge: 2010-12-01 | DRG: 470 | Disposition: A | Discharge status: Skilled Nursing Facility | Payer: Medicare Other | Attending: Internal Medicine | Admitting: Internal Medicine

## 2010-11-24 ENCOUNTER — Emergency Department (HOSPITAL_COMMUNITY): Payer: Medicare Other

## 2010-11-24 DIAGNOSIS — S72033A Displaced midcervical fracture of unspecified femur, initial encounter for closed fracture: Principal | ICD-10-CM | POA: Diagnosis present

## 2010-11-24 DIAGNOSIS — E1142 Type 2 diabetes mellitus with diabetic polyneuropathy: Secondary | ICD-10-CM | POA: Diagnosis present

## 2010-11-24 DIAGNOSIS — E785 Hyperlipidemia, unspecified: Secondary | ICD-10-CM | POA: Diagnosis present

## 2010-11-24 DIAGNOSIS — W010XXA Fall on same level from slipping, tripping and stumbling without subsequent striking against object, initial encounter: Secondary | ICD-10-CM | POA: Diagnosis present

## 2010-11-24 DIAGNOSIS — I10 Essential (primary) hypertension: Secondary | ICD-10-CM | POA: Diagnosis present

## 2010-11-24 DIAGNOSIS — E1149 Type 2 diabetes mellitus with other diabetic neurological complication: Secondary | ICD-10-CM | POA: Diagnosis present

## 2010-11-24 DIAGNOSIS — J449 Chronic obstructive pulmonary disease, unspecified: Secondary | ICD-10-CM | POA: Diagnosis present

## 2010-11-24 DIAGNOSIS — R269 Unspecified abnormalities of gait and mobility: Secondary | ICD-10-CM | POA: Diagnosis present

## 2010-11-24 DIAGNOSIS — E871 Hypo-osmolality and hyponatremia: Secondary | ICD-10-CM | POA: Diagnosis present

## 2010-11-24 DIAGNOSIS — Y9210 Unspecified residential institution as the place of occurrence of the external cause: Secondary | ICD-10-CM | POA: Diagnosis present

## 2010-11-24 DIAGNOSIS — H9190 Unspecified hearing loss, unspecified ear: Secondary | ICD-10-CM | POA: Diagnosis present

## 2010-11-24 DIAGNOSIS — K5900 Constipation, unspecified: Secondary | ICD-10-CM | POA: Diagnosis not present

## 2010-11-24 DIAGNOSIS — M898X9 Other specified disorders of bone, unspecified site: Secondary | ICD-10-CM | POA: Diagnosis present

## 2010-11-24 DIAGNOSIS — J4489 Other specified chronic obstructive pulmonary disease: Secondary | ICD-10-CM | POA: Diagnosis present

## 2010-11-24 DIAGNOSIS — E875 Hyperkalemia: Secondary | ICD-10-CM | POA: Diagnosis present

## 2010-11-24 DIAGNOSIS — M889 Osteitis deformans of unspecified bone: Secondary | ICD-10-CM | POA: Diagnosis present

## 2010-11-24 HISTORY — PX: HIP FRACTURE SURGERY: SHX118

## 2010-11-24 LAB — GLUCOSE, CAPILLARY
Glucose-Capillary: 212 mg/dL — ABNORMAL HIGH (ref 70–99)
Glucose-Capillary: 228 mg/dL — ABNORMAL HIGH (ref 70–99)

## 2010-11-24 LAB — COMPREHENSIVE METABOLIC PANEL
ALT: 30 U/L (ref 0–53)
AST: 23 U/L (ref 0–37)
Albumin: 3.5 g/dL (ref 3.5–5.2)
CO2: 24 mEq/L (ref 19–32)
Calcium: 9.2 mg/dL (ref 8.4–10.5)
GFR calc Af Amer: >60 mL/min (ref >60)
Sodium: 125 mEq/L — ABNORMAL LOW (ref 135–145)
Total Protein: 6.5 g/dL (ref 6.0–8.3)

## 2010-11-24 LAB — URINALYSIS, ROUTINE W REFLEX MICROSCOPIC
Bilirubin Urine: NEGATIVE
Glucose, UA: 500 mg/dL — AB
Hgb urine dipstick: NEGATIVE
pH: 6 (ref 5.0–8.0)

## 2010-11-24 LAB — DIFFERENTIAL
Eosinophils Absolute: 0.2 10*3/uL (ref 0.0–0.7)
Lymphs Abs: 1.1 10*3/uL (ref 0.7–4.0)
Neutrophils Relative %: 90 % — ABNORMAL HIGH (ref 43–77)

## 2010-11-24 LAB — PROTIME-INR
INR: 1.03 (ref 0.00–1.49)
Prothrombin Time: 13.7 seconds (ref 11.6–15.2)

## 2010-11-24 LAB — CBC
MCV: 87.5 fL (ref 78.0–100.0)
Platelets: 229 10*3/uL (ref 150–400)
RBC: 4.01 MIL/uL — ABNORMAL LOW (ref 4.22–5.81)
WBC: 22.3 10*3/uL — ABNORMAL HIGH (ref 4.0–10.5)

## 2010-11-24 LAB — APTT: aPTT: 37 seconds (ref 24–37)

## 2010-11-25 LAB — DIFFERENTIAL
Eosinophils Absolute: 0.4 10*3/uL (ref 0.0–0.7)
Eosinophils Relative: 3 % (ref 0–5)
Lymphocytes Relative: 8 % — ABNORMAL LOW (ref 12–46)
Lymphs Abs: 1 10*3/uL (ref 0.7–4.0)
Monocytes Relative: 6 % (ref 3–12)
Neutrophils Relative %: 83 % — ABNORMAL HIGH (ref 43–77)

## 2010-11-25 LAB — GLUCOSE, CAPILLARY: Glucose-Capillary: 143 mg/dL — ABNORMAL HIGH (ref 70–99)

## 2010-11-25 LAB — SODIUM, URINE, RANDOM: Sodium, Ur: 88 mEq/L

## 2010-11-25 LAB — HEMOGLOBIN A1C: Hgb A1c MFr Bld: 7.4 % — ABNORMAL HIGH (ref <5.7)

## 2010-11-25 LAB — CBC
HCT: 32.5 % — ABNORMAL LOW (ref 39.0–52.0)
MCH: 30.5 pg (ref 26.0–34.0)
MCV: 87.6 fL (ref 78.0–100.0)
Platelets: 211 10*3/uL (ref 150–400)
RBC: 3.71 MIL/uL — ABNORMAL LOW (ref 4.22–5.81)

## 2010-11-25 LAB — TSH: TSH: 3.273 u[IU]/mL (ref 0.350–4.500)

## 2010-11-25 LAB — COMPREHENSIVE METABOLIC PANEL
Albumin: 3.3 g/dL — ABNORMAL LOW (ref 3.5–5.2)
Alkaline Phosphatase: 69 U/L (ref 39–117)
BUN: 20 mg/dL (ref 6–23)
Chloride: 94 mEq/L — ABNORMAL LOW (ref 96–112)
Creatinine, Ser: 0.66 mg/dL (ref 0.4–1.5)
GFR calc non Af Amer: >60 mL/min (ref >60)
Glucose, Bld: 180 mg/dL — ABNORMAL HIGH (ref 70–99)
Total Bilirubin: 0.6 mg/dL (ref 0.3–1.2)

## 2010-11-26 ENCOUNTER — Inpatient Hospital Stay (HOSPITAL_COMMUNITY): Payer: Medicare Other

## 2010-11-26 LAB — GLUCOSE, CAPILLARY
Glucose-Capillary: 172 mg/dL — ABNORMAL HIGH (ref 70–99)
Glucose-Capillary: 319 mg/dL — ABNORMAL HIGH (ref 70–99)

## 2010-11-26 LAB — CBC
HCT: 32.8 % — ABNORMAL LOW (ref 39.0–52.0)
Hemoglobin: 11.5 g/dL — ABNORMAL LOW (ref 13.0–17.0)
MCHC: 35.1 g/dL (ref 30.0–36.0)
RBC: 3.71 MIL/uL — ABNORMAL LOW (ref 4.22–5.81)

## 2010-11-26 LAB — BASIC METABOLIC PANEL
CO2: 24 mEq/L (ref 19–32)
Calcium: 8.4 mg/dL (ref 8.4–10.5)
Chloride: 94 mEq/L — ABNORMAL LOW (ref 96–112)
Glucose, Bld: 144 mg/dL — ABNORMAL HIGH (ref 70–99)
Sodium: 127 mEq/L — ABNORMAL LOW (ref 135–145)

## 2010-11-26 LAB — ABO/RH: ABO/RH(D): A POS

## 2010-11-26 LAB — TYPE AND SCREEN

## 2010-11-27 ENCOUNTER — Inpatient Hospital Stay (HOSPITAL_COMMUNITY): Payer: Medicare Other

## 2010-11-27 LAB — BASIC METABOLIC PANEL
BUN: 18 mg/dL (ref 6–23)
CO2: 25 mEq/L (ref 19–32)
CO2: 27 mEq/L (ref 19–32)
Calcium: 8.4 mg/dL (ref 8.4–10.5)
Calcium: 8 mg/dL — ABNORMAL LOW (ref 8.4–10.5)
Chloride: 93 mEq/L — ABNORMAL LOW (ref 96–112)
Creatinine, Ser: 0.66 mg/dL (ref 0.4–1.5)
GFR calc non Af Amer: >60 mL/min (ref >60)
Glucose, Bld: 241 mg/dL — ABNORMAL HIGH (ref 70–99)
Glucose, Bld: 199 mg/dL — ABNORMAL HIGH (ref 70–99)

## 2010-11-27 LAB — GLUCOSE, CAPILLARY
Glucose-Capillary: 152 mg/dL — ABNORMAL HIGH (ref 70–99)
Glucose-Capillary: 197 mg/dL — ABNORMAL HIGH (ref 70–99)

## 2010-11-27 LAB — CBC
MCH: 30.2 pg (ref 26.0–34.0)
MCHC: 34.2 g/dL (ref 30.0–36.0)
MCV: 88.4 fL (ref 78.0–100.0)
Platelets: 201 10*3/uL (ref 150–400)
RBC: 3.44 MIL/uL — ABNORMAL LOW (ref 4.22–5.81)
RDW: 12.5 % (ref 11.5–15.5)

## 2010-11-28 LAB — CBC
Hemoglobin: 9.6 g/dL — ABNORMAL LOW (ref 13.0–17.0)
MCH: 31.3 pg (ref 26.0–34.0)
MCV: 88.3 fL (ref 78.0–100.0)
Platelets: 198 10*3/uL (ref 150–400)
RBC: 3.07 MIL/uL — ABNORMAL LOW (ref 4.22–5.81)
WBC: 10.4 10*3/uL (ref 4.0–10.5)

## 2010-11-28 LAB — COMPREHENSIVE METABOLIC PANEL
AST: 20 U/L (ref 0–37)
Albumin: 2.6 g/dL — ABNORMAL LOW (ref 3.5–5.2)
Alkaline Phosphatase: 59 U/L (ref 39–117)
CO2: 29 mEq/L (ref 19–32)
Chloride: 94 mEq/L — ABNORMAL LOW (ref 96–112)
Creatinine, Ser: 0.67 mg/dL (ref 0.4–1.5)
GFR calc Af Amer: >60 mL/min (ref >60)
GFR calc non Af Amer: >60 mL/min (ref >60)
Potassium: 4.9 mEq/L (ref 3.5–5.1)
Total Bilirubin: 0.4 mg/dL (ref 0.3–1.2)

## 2010-11-28 LAB — GLUCOSE, CAPILLARY
Glucose-Capillary: 186 mg/dL — ABNORMAL HIGH (ref 70–99)
Glucose-Capillary: 220 mg/dL — ABNORMAL HIGH (ref 70–99)

## 2010-11-28 NOTE — Discharge Summary (Signed)
Andrew Norton, Andrew Norton NO.:  [id number]   MEDICAL RECORD NO.:  [id number]           PATIENT TYPE:   LOCATION:                                 FACILITY:   PHYSICIAN:  Ranelle Oyster, M.D.DATE OF BIRTH:  10-01-1926   DATE OF ADMISSION:  05/11/2008  DATE OF DISCHARGE:                               DISCHARGE SUMMARY   DISCHARGE DIAGNOSES:  1. Severe cervical spondylitic myelopathy.  2. Dysphagia.  3. Postoperative atrial fibrillation.  4. Hypertension.  5. Hyperkalemia with mild renal insufficiency.  6. Non-insulin-dependent diabetes mellitus.  7. Pain management.  8. Hyperlipidemia.  9. Enterobacter urinary tract infection.   This is a 77 year old white male with history of severe spondylitic  myelopathy with cord compromise of cervical 3-4, 4-5, and 5-6, presented  on May 04, 2008, with increased upper extremity weakness.  Underwent  ACDF of cervical 3-4, 4-5, and 5-6 on April 24, 2008, per Dr. [name].  Cervical collar at all times.  Slow Decadron taper.  Postoperative  atrial fibrillation placed on intravenous Cardizem per cardiology  services, Dr. Eden Emms.  No other anticoagulation at this time secondary  to recent surgery.  Echocardiogram with ejection fraction of 65% and  normal left ventricular function.  Blood pressures monitored with no  orthostasis noted.  Followup speech therapy for dysphagia.  Initially  n.p.o. with modified barium swallow on May 06, 2008, with  nasogastric tube in placed.  Plan for repeat swallow study which had  recently been advanced to a dysphagia 1 thin liquid diet with  intravenous fluids at night time.  He was admitted for comprehensive  rehab program.   PAST MEDICAL HISTORY:  1. Paget disease.  2. Hypertension.  3. Non-insulin-dependent diabetes mellitus with peripheral neuropathy.  4. Hyperlipidemia.  5. Chronic anemia.  6. Hard of hearing.  7. Left groin surgery in 2005.   No alcohol or tobacco.   ALLERGIES:  PENICILLIN.   SOCIAL HISTORY:  Lives with his wife.  Wife with history of femur  fracture in June 2005 with recent repair and removal of hardware from  her hip with limited mobility.  They lived in a three level home,  bedroom downstairs.  Other family members in the area work.   Functional history prior to admission was independent driving.   Functional status upon admission to rehab service was moderate assist  mobility with a rolling walker.   Medications prior to admission were:  1. Glipizide 10 mg twice daily.  2. Diovan with hydrochlorothiazide 160-12.5 mg daily.  3. Actos 45 mg daily.  4. Simvastatin 40 mg daily.  5. Aspirin 81 mg daily.  6. Iron supplement daily.  7. B12 injection monthly.   PHYSICAL EXAMINATION:  VITAL SIGNS:  Blood pressure 128/70, pulse 77,  temperature 98.9, respirations 18.  GENERAL:  This is an alert male in no acute distress, oriented x3.  Cervical collar in place.  Dressing clean, dry, and intact.  Deep tendon  reflexes were 1+.  Distal upper extremity weakness, left greater than  right.  Sensation decreased to light touch distally.  Calves remain cool  without any swelling, erythema, nontender.  LUNGS:  Clear to auscultation.  CARDIAC:  Regular rate and rhythm.  ABDOMEN:  Soft, nontender.  Good bowel sounds.   REHABILITATION HOSPITAL COURSE:  The patient was admitted to inpatient  rehab services with therapies initiated on a 3-hour daily basis  consisting of physical therapy, occupational therapy, speech therapy,  and rehabilitation nursing.  The following issues were addressed during  the patient's rehabilitation stay.  Pertaining to [name]'s severe  cervical spondylitic myelopathy, he had undergone ACDF cervical 3-4, 4-  5, and 5-6 on May 04, 2008, per Dr. [name].  Surgical site healing  nicely.  A cervical collar in place.  He had completed a Decadron taper.  Recent followup cervical spine films on May 24, 2008, were  monitored  closely.  The right-sided screw at cervical C6 had backed out  approximately half way and again this was monitored per Dr. [name] and  showed no other neurological changes.  His diet had been steadily  advanced to a regular consistency with thin liquids with followups per  speech therapy.  His lungs remain clear to auscultation.  Postoperative  atrial fibrillation with follow up per cardiology services as he  remained on his Lopressor.  Benicar and hydrochlorothiazide had recently  been held due to some hyperkalemia as well as mild renal insufficiency.  He did receive 30 g Kayexalate May 22, 2008, for a potassium of 5.8.  Latest chemistries on May 24, 2008, potassium 4.4, BUN 22,  creatinine 0.7.  His hydrochlorothiazide as well as Benicar will  continue to be on hold.  He had been placed on Cipro on May 25, 2008, for an Enterobacter urinary tract infection.  He denied any  dysuria or hematuria during that time.  His blood sugars were monitored  with history of non-insulin-dependent diabetes mellitus as he continued  on glipizide 10 mg twice daily as well as Actos with his latest blood  sugars of 171, 198, and 218.  No other changes were made in his meds due  to the fact that his recent completion of steroids and felt that his  blood sugars would normalize.  He did have a history of hyperlipidemia.  He remained on Crestor.  He had no bowel or bladder disturbances other  than some mild constipation that was resolved with laxative assistance.  Weekly collaborative interdisciplinary team conferences were held to  discuss the patient's estimated length of stay, any barriers to  discharge the fact that his wife had recent hip surgery for removal of  some hardware from her hip, mobility limited, and remaining family  members work, it was felt skilled nursing facility would be needed with  bed becoming available on May 26, 2008, discharge taking place at  that  time.   Latest labs on May 24, 2008, showed a sodium 130, potassium 4.4, BUN  22, creatinine 0.7.  Latest hemoglobin 13.4, hematocrit 39.3, platelet  135,000, WBC of 8.   Discharge medications at time of dictation included:  1. Vitamin B12 1000 mcg subcutaneously every month.  2. Glipizide 10 mg p.o. b.i.d.  3. Crestor 10 mg p.o. daily.  4. Actos 45 mg p.o. daily.  5. Protonix 40 mg p.o. daily.  6. Lopressor 12.5 mg p.o. b.i.d.  7. Senokot-S tablets 2 tablets p.o. at bedtime, hold for any loose      stools.  8. Ferrous sulfate 325 mg p.o. b.i.d.  9. Neurontin 100 mg p.o. b.i.d.  10.Flexeril 10 mg p.o. every 6 hours as needed, spasms.  11.Percocet 5/325 mg 1-2 tablets every 4 hours as needed, pain.   The patient's Benicar 20 mg daily and hydrochlorothiazide 12.5 mg daily  remain on hold due to some recent renal insufficiency, hyperkalemia.   His diet was 1800 calorie ADA diabetic diet with thin liquids.  No  straws.  Oral meds could be given whole with pureed only.   SPECIAL INSTRUCTIONS:  Cervical collar at all times.  The patient should  follow up with Dr. [name]. [name], neurosurgery, [phone], call for  appointment.      Mariam Dollar, P.A.      Ranelle Oyster, M.D.  Electronically Signed    DA/MEDQ  D:  05/25/2008  T:  05/26/2008  Job:  [id number]   cc:   Ranelle Oyster, M.D.  Noralyn Pick. Eden Emms, MD, [facility]  [name]. [name], M.D.

## 2010-11-28 NOTE — Assessment & Plan Note (Signed)
Andrew Norton is back regarding his cervical myelopathy.  He had good results with  the left shoulder injection.  He essentially is out of his collar  currently and wearing the soft collar only occasionally when he needed  it for pain control.  The patient saw Dr. Sandria Manly and apparently was placed  on some midodrine for orthostasis and this seems to help some dizziness  he was experiencing.  He uses a walker for balance.  Pain today is 0/10.  He is sleeping well.  He occasionally has some knee buckling when he  walks.  He has hard time with some of his ADLs and have to do with his  face and head due to shoulder weakness, right more than left and  decreased sensation in the hands.  His leg sensation seems to have  improved quite a bit.  He rarely uses Percocet at this point any  further.  He is still on Neurontin 100 mg b.i.d.   REVIEW OF SYSTEMS:  Notable for the above as well as some loss of taste,  occasional constipation, high sugars.  Full review is in the written  health and history section of the chart.   SOCIAL HISTORY:  The patient is married and wife is with him today.  Apparently, she has an upcoming surgery.   PHYSICAL EXAMINATION:  VITAL SIGNS:  Blood pressure is 147/54, pulse is  90, respiratory rate 80.  He is sating 100% on room air.  GENERAL:  The patient is pleasant, alert and oriented x3.  NEUROLOGIC:  He walked without his walker today and had some slight  buckling in the right knee, but overall was stable.  His balance was  much improved.  Sensation was 1/2 in the upper limbs and 2/2 lower limbs  today.  Strength in both legs is near 5/5.  Upper extremity strength is  4+ to 5/5 with more weakness in the right deltoid which was 2+ to 3/5  depending on the moment.  Left deltoid was 3+/5.  Fine motor movement  was much improved, but still altered right more than left.  Bicipital  tendon and shoulders were nontender with manipulation today.  Cognitively, he is intact.  Mental  anxiety was seen.  Cranial nerve exam  was normal.  He is wearing a soft cervical collar.  HEART:  Regular.  CHEST:  clear.  ABDOMEN:  Soft, nontender.   ASSESSMENT:  1. Cervical spondylosis with myelopathy.  2. Atrial fibrillation.  3. Left shoulder adhesive capsulitis/tendinitis.  4. History of dysphagia.   PLAN:  1. Continue outpatient OT and PT.  She continues to progress from ADL      and balance standpoint going forward.  2. Dysphagia.  Barium swallow, we will order per Dr. Sandria Manly.  It sounds      as if he may have anatomical problem that are related to the      cervical spondylosis and surgery.  3. I will see the patient back in about 6 months.  I am very pleased      with his progress.  He is to call me with any pain or problems at      that surface.      Ranelle Oyster, M.D.  Electronically Signed     ZTS/MedQ  D:  08/24/2008 11:56:32  T:  08/25/2008 16:10:96  Job #:  [id number]   cc:   Genene Churn. Love, M.D.  Fax: [phone]

## 2010-11-28 NOTE — Assessment & Plan Note (Signed)
Andrew Norton is back regarding his cervical myelopathy and postoperative course.  He was on rehab with [place] in late October and early November.  He was  discharged to skilled nursing facility and now his home with his wife.  He is happy with his progress.  He is still having some pain in the neck  and left shoulder area.  He has some persistent weakness.  For some  reason, he is not being provided OT at home, which I cannot explain.  The patient rates his pain 4-5/10.  Pain is most prominent in the left  shoulder and with range of motion.  Pain interferes with general  activity, relationship with others, and enjoyment of life on a moderate  level.  He may use 2-3 Percocet 5/325 today for pain at this point.  He  has been placed on Cipro for urinary tract infection recently.  He  remains on Neurontin 100 mg twice a day.   REVIEW OF SYSTEMS:  Notable for dizziness, constipation, poor appetite,  and anxiety.  Other pertinent positives are above, and full 14-point  review is in the written health and history section in the chart.   SOCIAL HISTORY:  The patient is married and his wife is with him today.   PHYSICAL EXAMINATION:  VITAL SIGNS:  Blood pressure is 119/48, pulse is  87, respiratory rate 18, and he is sating 95% on room air.  GENERAL:  The patient is pleasant, alert, and oriented x3.  Affect is  bright and appropriate.  MUSCULOSKELETAL:  The patient walks with his cane and fairly moves  quickly and almost impulsively at times.  He has good balance and change-  of-direction.  Strength is 5/5 in both legs.  Upper extremity strength  is improved quite a bit as well with 3+ to 4/5 strength in the left  shoulder, 4/5 left distal upper extremity.  Right upper extremity is  still 1+ to 2/5 proximal to 4/5 distally.  Sensation is decreased at 1/2  in both upper extremities with near normal sensation in both legs today.  Left shoulder is notable for adhesive capsulitis signs and limited  abduction,  and pain with rotation internally and externally.  Bicipital  tendons are minimally tender.  The patient had minimal tenderness in the  right shoulder with manipulation today.  Cognitively, he is generally  intact.  Cranial nerve exam is normal.  He is wearing a cervical collar  still today.  HEART:  Regular.  CHEST:  Clear.  ABDOMEN:  Soft and nontender.  Weight is good.  Overall, he looks much  improved.   ASSESSMENT:  1. Cervical spondylosis with myelopathy.  2. History of atrial fibrillation.  3. Left shoulder adhesive capsulitis.   PLAN:  1. We need to get OT at his house to work on range of motion for the      left shoulder and upper extremities in general.  2. After informed consent, we injected the left shoulder via lateral      approach with 20 mg Kenalog and 3 mL of 1% lidocaine.  The patient      tolerated well.  3. Continue Percocet for breakthrough pain symptoms as well as his      Neurontin 100 mg b.i.d.  We may be able to wean Neurontin off soon.      Some of his ongoing pain is lightly radicular in nature, although      some is also      referred from his  operative site.  4. I will see him back in about 1 month's time.      Ranelle Oyster, M.D.  Electronically Signed     ZTS/MedQ  D:  06/30/2008 13:13:01  T:  07/01/2008 05:44:57  Job #:  [id number]   cc:   [name]. [name], M.D.  Fax: [phone]

## 2010-11-28 NOTE — Consult Note (Signed)
Andrew Norton, Andrew Norton NO.:  [id number]   MEDICAL RECORD NO.:  [id number]          PATIENT TYPE:  OIB   LOCATION:  3106                         FACILITY:  [facility]   PHYSICIAN:  Noralyn Pick. Eden Emms, MD, FACCDATE OF BIRTH:  05/14/1927   DATE OF CONSULTATION:  DATE OF DISCHARGE:                                 CONSULTATION   A 77 year old patient, we are asked to see in the neuro PACU for rapid  atrial fibrillation.  The patient's primary care doctor is Dr. Jeannett Senior  A. [name], MD.   The patient has no previous history of coronary artery disease or other  cardiac problems.   He came to the hospital for an anterior cervical spine procedure.   In the PACU, he went in rapid AFib, it is actually asymptomatic.   In talking to the patient, he is not having chest pain, shortness of  breath, or palpitations.  He does seem a little bit confused.  He  indicated that he was not suppose to be here this long.   He has no previous history of AFib or arrhythmias.   His review of systems is remarkable for significant neck pain, which he  was having a surgery for, otherwise negative.   He is allergic to PENICILLIN, which causes hives.   MEDICATIONS:  1. Diovan100/12.5.  2. Simvastatin 40 a day.  3. Aspirin a day.  4. Iron.  5. Glipizide 10 mg a day.  6. Actos 45 a day.  7. B12 shots.   He has a history of Paget's disease, hypertension, diabetes, history of  left foot fracture, history of anemia, history of peripheral neuropathy,  and recent neck pain requiring cervical surgery.   The patient lives [place] with his wife.  He is retired.  He does not  smoke or drink.  She walks on a regular basis.   Father died at age 41 of heart failure.  Mother died at age 84 with a  question of TB.   The patient's exam is remarkable for an elderly white male in no  distress.  He has a large cervical collar on.  He is in the PACU.  He is  in AFib rate of 140, blood pressure 140/70,  respiratory rate is 14, and  afebrile.  HEENT otherwise unremarkable.  Neck, not fully examined,  minimal due to collar.  He has a fresh incision along the left side of  his neck.  No carotid bruits.  No lymphadenopathy, thyromegaly, or JVP  elevation.  Lungs are clear with diaphragmatic motion.  No wheezing, S1-  S2, and normal heart sounds.  PMI normal.  Abdomen is benign.  Bowel  sounds positive.  No AAA.  No tenderness.  No bruit, no hepatojugular  reflux, or tenderness.  His pulse is intact.  No edema.  Neuro is  nonfocal.  Skin is warm and dry.  No muscular weakness.   Chest x-ray shows COPD without acute findings.  Crit is 34.2, platelets  214, potassium 4.5, creatinine 1.14, and blood sugar was elevated 267.   EKG shows atrial  fibrillation at a rapid rate with nonspecific ST-T wave  changes.   IMPRESSION:  1. Rapid atrial fibrillation.  We would continue with IV Cardizem for      rate control.  I believe he has gotten 10 mg bolus and I would drip      him a 10-mg an hour since this is the acute onset and the start of      the AFib was witnessed.  I think it would be reasonable to add      amiodarone 150 mg bolus, 60 mg now for 12 hours and then 30 mg an      hour.   Hopefully, he will convert by the morning.  He is not a good candidate  for anticoagulation after just having his neck surgery.  If he persists  in atrial fibrillation in the morning, we will have to make a decision  regarding possible cardioversion within 24 hours.  We will check a 2-D  echocardiogram to further assess his heart, but I suspect his LV  function is normal.  1. Hypertension, currently well controlled.  Continue Diovan.  2. Hypercholesterolemia, continue simvastatin.  3. Diabetes, will likely need sliding scale insulin.  We will leave      this up to the primary service.  We would reinstitute the glipizide      and Actos.   Further recommendations will be based on response to the Cardizem and   amiodarone.      Noralyn Pick. Eden Emms, MD, [facility]  Electronically Signed     PCN/MEDQ  D:  05/04/2008  T:  05/05/2008  Job:  [id number]

## 2010-11-28 NOTE — H&P (Signed)
Andrew Norton, JIMINEZ NO.:  [id number]   MEDICAL RECORD NO.:  [id number]          PATIENT TYPE:  IPS   LOCATION:  4010                         FACILITY:  [facility]   PHYSICIAN:  Ranelle Oyster, M.D.DATE OF BIRTH:  [birth year]   DATE OF ADMISSION:  05/11/2008  DATE OF DISCHARGE:                              HISTORY & PHYSICAL   CHIEF COMPLAINT:  Weakness in the upper extremities.   NEUROSURGEONDanae Norton. Andrew Maxon, MD   PRIMARY PHYSICIAN:  [name]. 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  The patient presented on May 04, 2008 with  worsening upper extremity weakness.  He underwent an ACDF at C3-C4, C4-  C5, and C5-C6 on May 04, 2008 by Dr. Venetia Norton.  He was place in an  Biochemist, clinical.  He was put on a slow Decadron wean.  Postoperatively, he  developed atrial fibrillation, placed on IV Cardizem per Cardiology.  No  anticoagulation was used secondary to recent surgery.  Echocardiogram  revealed an ejection fraction of 65%, normal left ventricular function.  BP and heart rate have been controlled with Lopressor, Benicar, and  hydrochlorothiazide.  The patient has had persistent dysphagia and MBS  on May 06, 2008 showed ongoing dysphagia and the patient was made  n.p.o.  NG tube was placed with repeat swallow test today.  He was  changed to a D1 thin liquid diet and IV fluids were changed to 7 p.m. to  7:00 a.m. only.  The patient continue to struggle particularly with self-  care mobility.  He has ongoing weakness in the left upper extremity more  than right upper extremity.  He was evaluated by the rehab team who felt  he could benefit from inpatient rehab and thus was admitted today.   REVIEW OF SYSTEMS:  Notable for weakness in the upper extremity and  numbness.  He reports constipation.  He has had some urinary frequency  due to the IV fluid running.  Pain is under fair control.  He has had  some anxiety.  Other pertinent positives are as above.  Full reviews in  written H and P.   Past medical history is positive for Paget disease, hypertension, non-  insulin requiring diabetes with peripheral neuropathy, hyperlipidemia,  chronic anemia, hard-of-hearing, left groin surgery in 2005, negative  alcohol or tobacco use.   FAMILY HISTORY:  Noncontributory.   SOCIAL HISTORY:  The patient lives with his wife.  Wife has a history of  femur fracture in June 2009 with a followup repair and removal of  hardware.  She has been limited in lifting and uses a cane since the  surgery and injury.  They have a three-level house with bedroom  downstairs and five steps to enter.  Family in the area of works.   FUNCTIONAL HISTORY:  The patient is independent driving prior to  arrival.  On rehab evaluation, the patient with mod assist for  transfers,  mod assist for gait.  As of today 1 day later, the patient  has not made substantial changes in mobility and self care.  He remains  mod-to-max assist for ADLs.   ALLERGIES:  PENICILLIN.   HOME MEDICATIONS:  1. Glipizide 10 mg b.i.d.  2. Diovan/hydrochlorothiazide 160/12.5 mg daily.  3. Actos 45 mg daily.  4. Simvastatin 40 mg daily.  5. Aspirin 81 mg daily.  6. Iron sulfate daily.  7. B12 injection monthly.   LABS:  Hemoglobin 12, white count 6.9, platelets 219,000.  Sodium 132,  potassium 5.3, BUN and creatinine 25 and 0.8.   PHYSICAL EXAM:  VITAL SIGNS:  Blood pressure is 128/70, pulse is 77,  respiratory rate 18, temperature 98.9.  GENERAL:  The patient is pleasant, alert, and oriented x3.  Affect is  bright and appropriate.  He is a bit anxious.  HEENT:  Nose and throat exam is notable for NG tube.  Oral mucosa is  pink and moist and dentition is fair.  NECK:  Supple without JVD or lymphadenopathy.  The wound is clean, dry,  and intact.  He has a cervical Aspen  collar in place.  RESPIRATORY:  Clear to auscultation bilaterally.  HEART:  Regular rate and rhythm without murmur, rubs, or gallops.  ABDOMEN:  Soft, nontender, slightly distended.  Bowel sounds were  positive.  SKIN:  As noted above.  NEUROLOGIC:  Cranial nerves II through XII are grossly intact.  Gag was  a bit weak.  Reflexes are 1+.  Sensation is decreased to left upper  extremity more than right extremity today.  He has trace pinprick loss  in the legs.  Motor function is essentially 2/5 left shoulder, 2 to 2+  right shoulder.  Biceps are 4/5 on the left, 4/5 on right.  Triceps 3/5  on the left, 3/5 on right.  Wrist extension 3/5 on right, 2+/5 left.  Hand intrinsics 1+/5 on the left, 2+/5 on the right.  The patient has  some edema at 1+ in the left upper extremity including wrist, forearm,  and hand.  Lower extremity strengths near 5/5, decreased fine motor  movements.  Judgment, orientation, memory and mood were within  functional limits, although the patient was a bit anxious.   ASSESSMENT AND PLAN:  1. Functional deficits secondary to severe cervical spondylosis with      myelopathy and more of a central cord picture than anything else.      The patient is admitted to the inpatient rehab unit today after his      anterior cervical diskectomy and fusion to receive comprehensive      collaborative interdisciplinary care between the physiatrist rehab      nursing staff and therapy team.  The patient's level medical      complexity and substantial therapy needs to context of the medical      necessity cannot be provided at a lesser intensity of care.      Physiatrist will provide 24-hour management of medical needs as      well as oversight of the therapy plan/treatment and provide      guidance as appropriate regarding interaction of the tube.  24-hour      rehab nursing will assist to manage the patient's bowel and bladder      needs.  We will need work on his constipation and  may be suffering      from neurogenic bowel.  We will follow up for urinary function as  he is at risk for neurogenic bladder as well, although he seems it      to be voiding.  Nursing will follow up for pain management as well      as sleep, respiratory issues, etc.  PT will assess and treat for      balance, safety issues, appropriate equipment, and family education      as appropriate.  OT will assess and treat for adaptive equipment,      compensatory strategies, edema management for the left upper      extremity, appropriate equipment, and family education as well.      Speech language pathology will follow and treat for significant      dysphagia.  Hopefully, this will improve as swelling continues to      decrease around the surgical site.  Case management/social worker      were assess for psychosocial issues and discharge planning.  Team      conferences will be held weekly to establish goals, assess      progress, and to determine barriers of discharge.  The patient will      receive at least 3 hours of therapy per day at least 5 days per      week.  Rehab goals are modified independent supervision with gait.      He will require modified independent to min-assist with ADLs      depending on amount of muscle return in the hands especially on the      left side.  He should be modified independent with his diet before      discharge.  Estimated length of stay is 7-10 days.  Prognosis is      fair to good.  2. Postoperative atrial fibrillation:  Continue to follow up per      Cardiology.  The patient remains on Lopressor as well as Benicar      per Cardiology team.  Continue hydrochlorothiazide as well.      Continue to watch fluid status as well as tolerance of therapies,      heart rate, etc.  3. Non-insulin-requiring diabetes.  Maintain glipizide and Actos at      current doses.  Check CBGs a.c. and at bedtime and address as      appropriate on a serial basis.  4. Pain  control:  He seems to be under reasonable control at this      point with Vicodin and Flexeril.  Observe for impact of pain upon      therapy and for tolerance of medications.  5. Deep venous thrombosis prophylaxis with thigh-high TED hose.  6. Dysphagia:  We will hold the Jevity tube feeds.  Begin D1 thin      liquid diet per speech recommendations.  We will flush tube with      water per protocol.      Ranelle Oyster, M.D.  Electronically Signed     ZTS/MEDQ  D:  05/11/2008  T:  05/12/2008  Job:  [id number]

## 2010-11-28 NOTE — Discharge Summary (Signed)
NAMEANSHUL, Andrew Norton NO.:  [id number]   MEDICAL RECORD NO.:  [id number]          PATIENT TYPE:  INP   LOCATION:  3106                         FACILITY:  [facility]   PHYSICIAN:  [name]. [name], M.D.  DATE OF BIRTH:  [birth year]   DATE OF ADMISSION:  05/04/2008  DATE OF DISCHARGE:  05/11/2008                               DISCHARGE SUMMARY   REASON FOR ADMISSION:  1. Cervical disk herniation with myelopathy.  2. Cervical spondylosis with myelopathy.  3. Atrial fibrillation.  4. Type 2 diabetes.  5. Hypertension, not otherwise specified.  6. Hypercholesterolemia.  7. Idiopathic peripheral neuropathy.  8. Osteitis deformans, not otherwise specified.  9. Dysphagia, unspecified.  10.Anxiety, state not otherwise specified.  11.Unspecified constipation.  12.Chronic airway obstruction.   FINAL DIAGNOSES:  1. Cervical disk herniation with myelopathy.  2. Cervical spondylosis with myelopathy.  3. Atrial fibrillation.  4. Type 2 diabetes.  5. Hypertension, not otherwise specified.  6. Hypercholesterolemia.  7. Idiopathic peripheral neuropathy.  8. Osteitis deformans, not otherwise specified.  9. Dysphagia unspecified.  10.Anxiety, state not otherwise specified.  11.Unspecified constipation.  12.Chronic airway obstruction.   HISTORY OF ILLNESS AND HOSPITAL COURSE:  Andrew Norton is a 77 year old man  with critical spinal stenosis at C3-4, C4-5, and C5-6 levels with a  significant amount of spinal cord compression.  It was elected to take  him to surgery for anterior cervical decompression and fusion at these  affected levels.  He underwent uncomplicated surgery.  However, upon  waking up, he woke up slowly and then appeared to be quite weak in his  arms, left rib worse than right with his right deltoid not better than  antigravity.  He was taken to intensive care.  He has had an MRI  performed and this did not show significant stenosis nor evidence of  hematoma.  He  gradually improved with steroids and had significant  improvement in his right arm strength.  He also had difficulty with  swallowing and was made n.p.o., had a swallowing Panda tube placed and  gradually had improvement in difficulty with swallowing.  He  was eventually transferred to the inpatient rehabilitation service with  improving bilateral grip strength and persistent right deltoid weakness.  He was voiding with a urinal, was mobilizing with difficulty.  Instructions were to follow up with me after discharge from rehab.      [name]. [name], M.D.  Electronically Signed     JDS/MEDQ  D:  06/25/2008  T:  06/26/2008  Job:  [id number]

## 2010-11-28 NOTE — Assessment & Plan Note (Signed)
HISTORY:  [name] is back regarding a cervical myelopathy.  I last saw  him in February.  He is progressed quite nicely.  He is using primarily  a cane for balance.  He still frustrated by the progression of his  strength and he has a hard time with his knee stability as well as  sensation in his hands.  He was placed on increasing doses of Neurontin  by Dr. Venetia Norton for paresthesias in the hands and I have noticed much of  change as of yet, but he has not completed the titration.  He completed  physical therapy and now is generally walking on his own for exercise.   REVIEW OF SYSTEMS:  Notable for high sugars.  Other pertinent positives  above and full review is in the written health and history section of  the chart.   SOCIAL HISTORY:  The patient is married, living with his wife, who has  had some recent medical issues of her own.   PHYSICAL EXAMINATION:  VITAL SIGNS:  Blood pressure 132/50, pulse is 67,  respiratory rate 18, and sating 96% on room air.  GENERAL:  The patient is pleasant, alert, and oriented x3.  MUSCULOSKELETAL:  He has some buckling still on the right knee in  standing, but still has some issues with the weight shift to the right,  but it is overall much improved.  He is walking without his cane today  in fact.  Strength in the lower extremities is near 5/5.  He has  decreased proprioception slightly.  Upper extremity strength is 3+-4+/5  with decreased sensation at 1/2 with decreased proprioception  especially.  Finger-to-nose coordination was fair to good.  He had no  obvious pronator drift on exam today.  Shoulders were nontender with  manipulation.  Cognitively, he is intact.  Mood was pleasant and  relaxed.  HEART:  Regular.  CHEST:  Clear.  ABDOMEN:  Soft and nontender.   ASSESSMENT:  1. Cervical spondylosis with myelopathy.  2. Atrial fibrillation.  3. Left shoulder adhesive capsulitis/tendonitis.  4. Dysphagia.   PLAN:  1. The patient will follow up  with Dr. Venetia Norton and Dr. Sandria Manly regarding      his surgical and neurological sequelae.  He seems to be improving      nicely from a functional standpoint.  He could benefit from a trial      of aquatic therapy to improve his balance and strength.  I will      leave that up to him as to whether he chooses to proceed with that      or not.  2. If he has any further pain problems, I would be happy to follow      him.  He will call me if he should choose to do      so.  3. I will see him back on a p.r.n. basis.      Ranelle Oyster, M.D.  Electronically Signed     ZTS/MedQ  D:  02/11/2009 13:25:47  T:  02/12/2009 06:37:19  Job #:  [id number]   cc:   Evie Lacks, MD  Fax: [phone]   [name]. Andrew Norton, M.D.  Fax: [phone]

## 2010-11-28 NOTE — Assessment & Plan Note (Signed)
[facility]                            [facility] OFFICE NOTE   NAME:Andrew Norton, Andrew Norton                          MRN:          [id number]  DATE:12/11/2006                            DOB:          1926/08/05    This is a 77 year old gentleman here to establish with our practice.  He  has no particular complaints today.  He had been seeing Dr. Foye Deer  for primary care, until Dr. Idell Pickles recently retired.  He is now  transferring to [place].   REVIEW OF SYSTEMS:  Positive for some continued pain in the left  forefoot.  He fell in March of this year and sustained a couple of  metatarsal fractures.  He had been seeing Dr. Madelon Lips for that.  They  seemed to be slowly getting better, and Dr. Madelon Lips told him that he  needed to follow up only as needed.   PAST MEDICAL HISTORY:  Patient had a complete physical examination in  October of last year.  He has a history of diabetes, a history of high  cholesterol, of hypertension, and of chronic anemia.  His last  hemoglobin was 10.7 in November of 2007.  He is advised to start iron  supplementation; however, after taking a single 325-mg iron pill once  daily, he had to stop it after a week, due to nausea.  His blood sugars  are very well controlled.  His last hemoglobin A1c was 6.6 in October of  2007.  He was diagnosed with Paget's disease in 2006.  He had a history  of a positive tuberculosis skin test some years ago but was treated with  antibiotics for that.  He had an unremarkable colonoscopy, as well as an  unremarkable upper endoscopy in February of this year, per Dr. Carman Ching.  He has had a tonsillectomy.  He sees Dr. Nita Sells on a  regular basis for dermatology checks, and he sees Dr. Pearlean Brownie on a regular  basis for ophthalmology checks.   ALLERGIES:  PENICILLIN.   CURRENT MEDICATIONS:  1. Glypizide 10 mg per day.  2. Diovan HCT 160/12.5 per day.  3. Actos 45 mg per day.  4. Simvastatin 40 mg per  day.  5. Aspirin 81 mg per day.   HABITS:  He does not use tobacco or alcohol.   SOCIAL HISTORY:  He is married and retired.   FAMILY HISTORY:  Remarkable for tuberculosis in one of his parents.   OBJECTIVE:  VITAL SIGNS:  Height 6 feet, 3/4 of an inch, weight 185, BP  130/62, pulse 64 and regular.  GENERAL:  He appears to be quite healthy and just quite alert for his  age.  We did not do a detailed examination today.   ASSESSMENT/PLAN:  PROBLEM:  1. Introductory visit for this gentleman with multiple medical      problems.  Will try to have his past records sent to [place] as quickly      as possible.  2. Hypertension, stable.  3. Type 2 diabetes mellitus.  Will check laboratories today including  a hemoglobin A1c.  4. Hyperlipidemia.  Will check a fasting lipid panel today.  5. History of renal insufficiency.  Will check his renal function with      the laboratories we obtained today.  6. Left foot pain, after recent fractures.  They seem to be healing as      expected.  I told him he should feel much better over the next      month or two.  7. Anemia.  Will check blood work today including a ferritin, serum      iron, B-12 and folate, and then will treat accordingly.     [name]. [name], MD  Electronically Signed    SAF/MedQ  DD: 12/11/2006  DT: 12/11/2006  Job #: [id number]

## 2010-11-28 NOTE — Op Note (Signed)
Andrew Norton, Andrew Norton NO.:  [id number]   MEDICAL RECORD NO.:  [id number]          PATIENT TYPE:  OIB   LOCATION:  3106                         FACILITY:  [facility]   PHYSICIAN:  [name]. [name], M.D.  DATE OF BIRTH:  Jul 21, 1926   DATE OF PROCEDURE:  05/04/2008  DATE OF DISCHARGE:                               OPERATIVE REPORT   PREOPERATIVE DIAGNOSES:  Herniated cervical disk with cervical  myelopathy, spondylosis with myelopathy, cervical stenosis, and cervical  radiculopathy.   POSTOPERATIVE DIAGNOSES:  Herniated cervical disk with cervical  myelopathy, spondylosis with myelopathy, cervical stenosis, and cervical  radiculopathy.   PROCEDURE:  Anterior cervical decompression and fusion C3-4, C4-5, and  C5-6 levels with allograft bone grafts, morselized and autograft  EquivaBone, and anterior cervical plate.   SURGEON:  [name]. [name], MD   ASSISTANT:  Coletta Memos, MD   ANESTHESIA:  General endotracheal anesthesia.   ESTIMATED BLOOD LOSS:  Minimal.   COMPLICATIONS:  None.   DISPOSITION:  To recovery.   INDICATIONS:  Andrew Norton is a 77 year old man with severe cervical  spondylitic myelopathy with severe spinal cord compression at C3-4, C4-  5, and C5-6 level.  It was elected to take him surgery for anterior  cervical decompression and fusion at these affected levels.   PROCEDURE:  Andrew Norton was brought to the operating room.  Following  satisfactory and uncomplicated induction of general endotracheal  anesthesia plus intravenous lines, the patient was placed in the supine  position on the operating table.  The neck was quite stiff.  It was  maintained in neutral alignment.  He was placed 5-pound halter traction.  His anterior neck was then prepped and draped in usual sterile fashion.  The area of planned incision was infiltrated with 0.25% Marcaine and  0.5% lidocaine with 1:100,000 epinephrine.  An incision was made on the  left side of midline in the  midline neck crease, carried through  platysma layer.  Subplatysmal dissection was performed exposing the  anterior border of the sternocleidomastoid muscle using blunt  dissection.  The carotid sheath was kept lateral and trachea and  esophagus kept medial, exposing anterior cervical spine.  A bent spinal  needle was placed and it was felt to be C4-5 level.  This was confirmed  on intraoperative x-ray.  Subsequently, the longus colli muscles were  taken down from the anterior cervical spine from C3 through C6 levels  using electrocautery and Key elevator and multiple large osteophytes  were removed with Leksell rongeur.  Shadow line retractor along with up  and down retractors were placed to facilitate exposure of the interspace  at C3-4, C4-5, and C5-6 were then incised with 15 blade.  Disk material  was removed in piecemeal fashion.  There was a large amount of central  disk herniation which was removed at the C3-4 level with decompression  of spinal cord dura.  The distraction pins were initially at placed C3  and C4 and after a thorough diskectomy with use of intraoperative  microscope, the endplates were decorticated.  Uncinate spurs were  drilled down.  Bone was removed with these maneuvers were saved for  later use of bone grafting.  The central spinal cord dura was  decompressed.  The posterior longitudinal ligament was removed.  The  underside of the inferior aspect of C3 and superior aspect of C4 were  then undercut and was felt that at this point the spinal cord dura was  significantly decompressed.  Hemostasis assured and after trial sizing,  a 7-mm allograft bone wedge was selected, fashioned with a high-speed  drill, packed with morcellized bone autograft and EquivaBone, inserted  into the interspace, and countersunk appropriately.  Similar  decompression was performed at C4-5.  The right side of the spinal cord  appeared to be more compressed at this level.  The spinal cord  dura was  decompressed extensively as were the neural foramina and hemostasis was  again assured.  After trial sizing, a 7-mm allograft bone wedge was  selected, fashioned with high-speed drill, packed with morcellized bone  autograft, and EquivaBone inserted in the interspace, and countersunk  appropriately.  Attention was then turned to the C5-6 level.  Distraction pins were removed from this level.  This level was very  spondylitic.  There was a lot of densely calcified posterior  longitudinal ligament which was removed with 1 and 2-mm Gold Tip  Kerrison rongeurs.  The spinal cord dura was decompressed at this side.  The left side of spinal cord appeared to be more compressed.  The spinal  cord dura was decompressed extensively at this interspace.  Hemostasis  again assured and after trial sizing, an 8-mm allograft bone wedge was  selected, fashioned with high-speed drill, packed with morcellized  autograft and EquivaBone, inserted in the interspace, and countersunk  appropriately.  A 57-mm Trestle anterior cervical plate was then affixed  to the anterior cervical spine using 14-mm variable angle screws two at  C3, two at C4, two at C5, and two at C6.  All screws had excellent  purchase and locking mechanisms were engaged.  Final x-ray demonstrated  well-positioned interbody grafts and anterior cervical plate.  Traction  weight was removed prior to placing the plate.  The wound was then  irrigated.  Soft tissues inspected and found to be in good repair.  Hemostasis was assured.  Platysma layer was closed with 3-0 Vicryl  sutures.  Skin edges were approximated 3-0 Vicryl subcuticular stitch.  The wound was dressed with Dermabond.  The patient was extubated in the  operating room and taken to the recovery in stable and satisfactory  condition.  He was placed in Aspen collar prior to leaving the operating  room.      [name]. [name], M.D.  Electronically Signed     JDS/MEDQ  D:   05/04/2008  T:  05/05/2008  Job:  [id number]

## 2010-11-29 LAB — URINALYSIS, ROUTINE W REFLEX MICROSCOPIC
Glucose, UA: 250 mg/dL — AB
Specific Gravity, Urine: 1.016 (ref 1.005–1.030)
pH: 5.5 (ref 5.0–8.0)

## 2010-11-29 LAB — BASIC METABOLIC PANEL
BUN: 21 mg/dL (ref 6–23)
CO2: 25 mEq/L (ref 19–32)
Chloride: 90 mEq/L — ABNORMAL LOW (ref 96–112)
GFR calc non Af Amer: >60 mL/min (ref >60)
Glucose, Bld: 187 mg/dL — ABNORMAL HIGH (ref 70–99)
Glucose, Bld: 184 mg/dL — ABNORMAL HIGH (ref 70–99)
Potassium: 4.4 mEq/L (ref 3.5–5.1)
Potassium: 4.4 mEq/L (ref 3.5–5.1)
Sodium: 121 mEq/L — ABNORMAL LOW (ref 135–145)

## 2010-11-29 LAB — CBC
HCT: 27.5 % — ABNORMAL LOW (ref 39.0–52.0)
HCT: 26.2 % — ABNORMAL LOW (ref 39.0–52.0)
Hemoglobin: 9.2 g/dL — ABNORMAL LOW (ref 13.0–17.0)
MCHC: 35.1 g/dL (ref 30.0–36.0)
MCV: 87.3 fL (ref 78.0–100.0)
RBC: 2.99 MIL/uL — ABNORMAL LOW (ref 4.22–5.81)
RDW: 12.6 % (ref 11.5–15.5)
WBC: 11.1 10*3/uL — ABNORMAL HIGH (ref 4.0–10.5)
WBC: 10.2 10*3/uL (ref 4.0–10.5)

## 2010-11-29 LAB — GLUCOSE, CAPILLARY: Glucose-Capillary: 227 mg/dL — ABNORMAL HIGH (ref 70–99)

## 2010-11-30 LAB — BASIC METABOLIC PANEL
BUN: 17 mg/dL (ref 6–23)
CO2: 28 mEq/L (ref 19–32)
Calcium: 8.3 mg/dL — ABNORMAL LOW (ref 8.4–10.5)
Chloride: 95 mEq/L — ABNORMAL LOW (ref 96–112)
Creatinine, Ser: 0.65 mg/dL (ref 0.4–1.5)
GFR calc Af Amer: >60 mL/min (ref >60)
GFR calc non Af Amer: >60 mL/min (ref >60)
Glucose, Bld: 181 mg/dL — ABNORMAL HIGH (ref 70–99)
Potassium: 4.9 mEq/L (ref 3.5–5.1)
Sodium: 129 mEq/L — ABNORMAL LOW (ref 135–145)

## 2010-11-30 LAB — SODIUM, URINE, RANDOM: Sodium, Ur: 54 mEq/L

## 2010-11-30 LAB — GLUCOSE, CAPILLARY
Glucose-Capillary: 192 mg/dL — ABNORMAL HIGH (ref 70–99)
Glucose-Capillary: 257 mg/dL — ABNORMAL HIGH (ref 70–99)
Glucose-Capillary: 193 mg/dL — ABNORMAL HIGH (ref 70–99)
Glucose-Capillary: 302 mg/dL — ABNORMAL HIGH (ref 70–99)

## 2010-11-30 LAB — OSMOLALITY: Osmolality: 263 mOsm/kg — ABNORMAL LOW (ref 275–300)

## 2010-12-01 LAB — GLUCOSE, CAPILLARY
Glucose-Capillary: 214 mg/dL — ABNORMAL HIGH (ref 70–99)
Glucose-Capillary: 269 mg/dL — ABNORMAL HIGH (ref 70–99)

## 2010-12-01 LAB — BASIC METABOLIC PANEL
BUN: 13 mg/dL (ref 6–23)
Calcium: 8.3 mg/dL — ABNORMAL LOW (ref 8.4–10.5)
GFR calc non Af Amer: >60 mL/min (ref >60)
Potassium: 4.6 mEq/L (ref 3.5–5.1)
Sodium: 128 mEq/L — ABNORMAL LOW (ref 135–145)

## 2010-12-01 NOTE — Op Note (Signed)
NAMEADONAI, SELSOR NO.:  [id number]   MEDICAL RECORD NO.:  [id number]          PATIENT TYPE:  AMB   LOCATION:  DAY                          FACILITY:  [facility]   PHYSICIAN:  Anselm Pancoast. Weatherly, M.D.DATE OF BIRTH:  01-31-1927   DATE OF PROCEDURE:  06/27/2004  DATE OF DISCHARGE:                                 OPERATIVE REPORT   PREOPERATIVE DIAGNOSIS:  Persistent extramammary Paget's disease, left  groin.   OPERATION:  Wide excision with frozen section.   ANESTHESIA:  General.   SURGEON:  Dr. Consuello Bossier   HISTORY:  Andrew Norton is a 77 year old male, whom I first saw approximately 2-  1/2 months ago when he had a chronic area on the left groin that had been  treated with numerous ointments and then biopsied by Dr. Nita Sells,  confirming this was extramammary Paget's disease.  We excised the area  approximately 6 weeks ago, but the margins were positive, and reexcision was  recommended.  I have also done punch biopsy of the surrounding areas  approximately a week ago.  Two were positive; two were negative, and we are  planning to basically reexcise more medially, definitely skin well over the  scrotum, to try to get definitely clear margins at this time.   The patient was given 1 g of Kefzol, general anesthesia, endotracheal tube,  placed up in lithotomy position.  The groin area was first prepped with  Betadine solution and draped in a sterile manner.  Next, I went about three-  quarters of an inch more medial than the two little biopsies in the office  that were positive and basically excised little slivers of skin and then  sent these labeled as #1 and #2 for the pathologist to do frozen, and she  said there was no evidence of Paget's disease in these.  While this was  being done, I had marked on the skin so that we did at least 1 cm from the  old incision, completely to posterior and then went much more medially since  the persistence is kind of  along the scrotal skin side.  The area was first  cut with a 15 blade, and then this was elevated with Allises and then  cautery used for separating the skin from the subcutaneous tissue.  I worked  along the thigh side first, went up and then the most anterior portion where  I kind of T'd it over toward the scrotum the first time, and I think it will  come together easy if I take it on to the inguinal incision this time and  then can advance the scrotal skin.  This was done, and then the biopsies did  come back, and they were negative, fortunately, and then I went ahead and  completed the incision more medially in the area of question.  The bleeders,  a few, were sutured with chromic, but most were lightly cauterized since  there were no big bleeders unlike the first time that we had excised this,  and then I closed the skin with  interrupted 3-0 chromic and then 3-0 nylon  simple sutures.  I did place two little short Penrose drains, brought out  through the mid portion of the wound so that if there is any fluid  accumulation in the subcutaneous little flaps area, it will drain out.  Numerous sutures were used and then after these had been all placed, I  placed the little Penrose drains and sutured them to the skin with two nylon  sutures.  Triple antibiotic ointment was placed on the incision and the  fluffy dressings held in place with little stretch panties and a little bit  of Hypafix.  The patient tolerated the procedure nicely and extubated and  sent to the recovery room in a stable postop condition.  He will be  released this afternoon, and I will see him in my office on Friday for  removal of the Penrose drains.  The pathologist did look at the completely  excised tissue and feels that the margins look clear but will do sectioning  at the various margins.      WJW/MEDQ  D:  06/27/2004  T:  06/27/2004  Job:  [id number]

## 2010-12-01 NOTE — Op Note (Signed)
NAMEDAMARE, SERANO NO.:  [id number]   MEDICAL RECORD NO.:  [id number]          PATIENT TYPE:  AMB   LOCATION:  DSC                          FACILITY:  [facility]   PHYSICIAN:  Anselm Pancoast. Weatherly, M.D.DATE OF BIRTH:  August 08, 1926   DATE OF PROCEDURE:  05/11/2004  DATE OF DISCHARGE:                                 OPERATIVE REPORT   PREOPERATIVE DIAGNOSIS:  Paget's disease, skin, left groin inguinal-femoral  area, scrotum.   OPERATION:  Wide excision of Paget's disease, left groin, with primary  closure.   General anesthesia.   SURGEON:  Anselm Pancoast. Zachery Dakins, M.D.   HISTORY:  Rasool Rommel is a 77 year old male who has had an area in the left  groin that he first thought was jockey itch.  He self-medicated himself for  awhile with no improvement.  Then he saw Dr. Margo Aye, who has tried numerous  ointments, etc.  Finally the area was biopsied and was identified as  extramammary Paget's disease and the patient was then referred to me.  On  examination, the area is basically a large area, 2 x 3 inches, of  erythematous little white plaques in the area with some surrounding reddish  areas that looked like kind of chronic moisture-type changes, and I  discussed this with Dr. Margo Aye and had him to mark the area where he thought  the margins should be prior to excising this.  The patient comes in today  for the planned procedure, was given a gram of Kefzol, and on examination  the area appears to be unchanged from when I last saw him in the office, and  there are blue marks circumferentially.  He area goes over to the lateral  aspect of the left side of the scrotum kind of up to the not really inguinal  but near the inguinal area but then going posterior to approximately two to  three inches from the anus.  I think that we can do a wide excision of this  and then primarily close the area but probably will end up placing a Penrose  drain.   The patient was positioned on the OR  table, received the Ancef without  problems, and then the area was positioned using the [place] stirrups so I  could get to the area, and I worked from the left side.  The wound and  scrotum, etc., was all prepped with Betadine surgical scrub and solution and  then draped in a sterile manner.  I placed a short stitch on the anterior  margin, a longer one with a single long arm on the scrotal area, and a  double long stitch posteriorly to orient the specimen.  I then just  basically used a scalpel to cut outside of the area where Dr. Margo Aye had  marked.  There is still a little redness medially kind of along the scrotum  that may or may not be a positive margin, but I am going to excise this at  this time and then let the pathologist tell us.  The gross areas and all are  well in the medial aspect with a good wide margin.  We tried to basically do  the procedure so that there was no contamination from the skin side to the  wound, sort of starting medially, holding it up until the instrument is  touching the skin area, and then excised it with sharp dissection and  cautery.  Several little blood vessels required suturing with 4-0 Vicryl,  and the area was completely excised.  Good hemostasis was obtained and then  I closed the subcuticular area with interrupted 4-0 Vicryl, and then skin  was closed with either a 4-0 or 3-0 nylon.  In the most medial portion where  the area goes up on the scrotum is a little pucker of the skin anterior, but  I would prefer not to try to do any further excision because of the size of  getting it closed without a graft.  The sutures were placed.  Then I placed  a half-inch Penrose drain, two pieces going anterior and medially.  These  were sutured to the skin with 4-0 nylon and we will remove the  drains in the office early next week.  The patient tolerated the procedure  nicely and was sent to the recovery room extubated, in a satisfactory postop  condition.  I am  going to keep him on Keflex 500 mg b.i.d. for about seven  days, and we will see him back in the office early next week.       WJW/MEDQ  D:  05/11/2004  T:  05/11/2004  Job:  [id number]   cc:   Mertha Finders., M.D.  [id number] [place]  [place] [address]  Fax: [phone]

## 2010-12-01 NOTE — H&P (Signed)
NAMECHAIS, FEHRINGER NO.:  [id number]   MEDICAL RECORD NO.:  [id number]          PATIENT TYPE:  AMB   LOCATION:  DAY                          FACILITY:  [facility]   PHYSICIAN:  Anselm Pancoast. Weatherly, M.D.DATE OF BIRTH:  [birth year]   DATE OF ADMISSION:  06/27/2004  DATE OF DISCHARGE:                                HISTORY & PHYSICAL   CHIEF COMPLAINT:  Extramammary Paget's disease left groin.   HISTORY:  Andrew Norton is a 77 year old Caucasian male who I first saw  approximately two months ago when he was referred to me by Dr. Nita Sells who  had biopsied an area of kind of inflamed granulation tissue in the left  groin that was extramammary Paget's disease.  I noted that the area was  quite large and really questionable where the actual margins were. I  referred it back to Dr. Renae Fickle who actually marked where he thought the  lesions stopped and then this was excised with general anesthesia as an  outpatient of [facility] approximately six weeks ago.  Unfortunately,  the margins were nearly all positive, the posterior margins were not  definitely involved and I discussed with him that this would need to be  reexcised.  I thought because of the large area that we had excised that we  wait probably 4-6 weeks and then reexcise the area. It would be more likely  that we could actually close the area because of the wound contraction.  The  patient was in agreement with this and then approximately two weeks ago, I  biopsied four areas in the office that were in the areas that I thought were  normal appearing skin and the two anterior ones were positive for recurrent  or persistent disease and then the posterior margins were negative so  therefore I am here to now excise this area and plan to do some margins  initially and then do a wide excision with hopefully primary closure. The  patient is otherwise in good health.  He is an oral diabetic on Glucotrol 10  mg daily and  his regular medications are Naftin.  Dr. Theresia Lo is his  regular physician and the patient states that he is allergic to PENICILLIN  but it gives a rash, he can take Kefzol without problems.   PHYSICAL EXAMINATION:  GENERAL:  The patient is a healthy appearing male in  no acute distress and looks younger than his stated age.  VITAL SIGNS:  Temperature 97.7, heart rate 68, respirations 18, blood  pressure 120/70, weight is 185 pounds and he is 6 feet and a 1/2 inches  tall.  His blood sugar when he checked in for preop for 135.  HEENT:  Normocephalic.  Pupils equal and well hydrated.  No cervical or  supraclavicular lymphadenopathy.  LUNGS:  Good breath sounds bilaterally.  CARDIAC:  Normal sinus rhythm.  ABDOMEN:  No organomegaly or problems.  No palpable groin nodes in either  the left or right. There is a recent healed incision, sutures have been  removed and then there are  some little punch biopsy sites that have stitches  in them.  There is no gross tumor that is visible on the skin even with low  power magnification. The right side looks perfectly normal of the skin and  inguinal crease area.  EXTREMITIES:  Unremarkable.  CNS:  Physiologic.   IMPRESSION:  Persistent extramammary Paget's disease left groin and scrotum  for wide excision.    WJW/MEDQ  D:  06/27/2004  T:  06/27/2004  Job:  [id number]

## 2010-12-01 NOTE — Procedures (Signed)
[facility]  Patient:    Andrew Norton, Andrew Norton Visit Number: [id number] MRN: [id number]          Service Type: END Location: ENDO Attending Physician:  Orland Mustard Dictated by:   Llana Aliment. Randa Evens, M.D. Proc. Date: 05/19/01 Admit Date:  05/19/2001   CC:         Dellis Anes. Idell Pickles, M.D.   Procedure Report  PROCEDURE:  Colonoscopy.  MEDICATIONS:  Fentanyl 50 mcg, Versed 5 mg IV.  SCOPE:  Olympus adult video colonoscope.  INDICATION:  Colon cancer screening.  DESCRIPTION OF PROCEDURE:  The procedure had been explained to the patient and consent obtained.  He was placed in the left lateral decubitus position.  The Olympus adult video colonoscope was inserted and advanced under direct visualization.  Prep was quite good.  The patient had marked diverticular disease.  Some time was taken to pass through the area.  Eventually we were able to pass the area of diverticulosis and advance rapidly to the cecum.  The ileocecal valve was seen, as was the appendiceal orifice.  The scope was withdrawn, the colon carefully examined on withdrawal.  The cecum, ascending colon, hepatic flexure, transverse colon, splenic flexure, descending, and sigmoid colon were seen well upon removal.  Moderate diverticulosis was seen in the sigmoid colon.  This area was seen much better upon withdrawal than entry.  No polyps or other lesions were seen.  The scope was withdrawn, and the rectum was in essence unremarkable.  The scope was withdrawn.  The patient tolerated the procedure well.  ASSESSMENT: 1. Moderate diverticular disease with narrowing and some difficulty passing. 2. Essentially normal colonoscopy otherwise.  PLAN:  Given information about diverticulosis, high-fiber diet, recommend Metamucil, etc.  Have him follow up with his family physician. Dictated by:   Llana Aliment. Randa Evens, M.D. Attending Physician:  Orland Mustard DD:  05/19/01 TD:  05/20/01 Job:  [id number]

## 2010-12-02 NOTE — Op Note (Signed)
Andrew Norton, Andrew Norton NO.:  [id number]  MEDICAL RECORD NO.:  [id number]           PATIENT TYPE:  I  LOCATION:  1439                         FACILITY:  [facility]  PHYSICIAN:  Almedia Balls. Ranell Patrick, M.D. DATE OF BIRTH:  [birth year]  DATE OF PROCEDURE:  11/26/2010 DATE OF DISCHARGE:                              OPERATIVE REPORT   PREOPERATIVE DIAGNOSES:  Left displaced femoral neck fracture as well as large acetabular osteophyte.  POSTOPERATIVE DIAGNOSES:  Left displaced femoral neck fracture as well as large acetabular osteophyte.  PROCEDURE PERFORMED:  Left hip hemiarthroplasty as well as a left acetabular cheilectomy.  SURGEON:  Almedia Balls. Ranell Patrick, M.D.  ASSISTANT:  Donnie Coffin. Durwin Nora, P.A.  ANESTHESIA:  General anesthesia was used via endotracheal tube.  ESTIMATED BLOOD LOSS:  300 cc.  FLUID REPLACEMENT:  1000 cc crystalloid.  URINE OUTPUT:  200 cc.  INSTRUMENT COUNT:  Correct.  COMPLICATIONS:  There were no complications.  Perioperative antibiotics were given.  INDICATIONS:  The patient is an 77 year old male who suffered a ground- level fall injuring his left hip.  The patient unable to ambulate or stand after his hip injury.  The patient presented to the hospital here at [facility] where he was identified as having a left hip femoral neck fracture.  The patient initially evaluate by my partner Dr. Ollen Gross.  The patient has a significant history for right leg weakness and pain secondary to spinal stenosis, under the care Dr. [name].  The patient reports problems with balance and problems with right leg strength and that may have contributed somewhat to the fall.  The patient has on x-ray evidence of a fairly large superior marginal osteophyte on the acetabulum.  The patient does have space remaining between the femoral head and the socket.  We discussed with the patient and his son options for treatment including hemiarthroplasty versus total hip.   With the hemiarthroplasty, we will definitely perform a chilectomy, remove that lateral osteophyte so that there would be no impingement.  The acetabulum appeared excessively worn and we proceeded with total hip replacement.  All questions were encouraged and answered. Decision was made to proceed with surgery.  The patient was cleared by Internal Medicine and was medically optimized prior to surgical management.  Informed consent obtained.  DESCRIPTION OF PROCEDURE:  After adequate level of anesthesia achieved, the patient positioned in the right lateral decubitus position with the left hip up, down leg padded appropriately, neurovascular structures padded, an axillary roll utilized, correct hip identified.  Time-out called.  After sterile prep and drape of the left hip, we entered the hip through a posterior approach, started the vastus ridge on the greater trochanter extending posteriorly along the gluteus maximus fiber direction.  We made our skin incision with 10 blade scalpel, dissection down through subcutaneous tissues using Bovie.  We divided the tensor fascia lata in line with the skin incision.  We identified the gluteus medius and retracted that and divided the short external rotators and posterior capsule off the posterior aspect of the femur.  We released the piriformis as well.  We then internally rotated the hip delivering the femoral neck.  I made a fresh neck cut one fingerbreadth above the lesser trochanter using oscillating saw with neck cutting guide template.  Next we removed the femoral head and sized that to a size 55. The hip socket looked to me to be in good shape.  The cartilage appeared fairly normal.  There was some labral tearing noticed anterosuperiorly, removed that frayed labrum using a sharp scalpel blade and then we did a subperiosteal dissection of the osteophyte on the superior lateral margin of the acetabulum.  Used  a three-quarter inch curved  osteotome to remove that osteophyte which came out without complication.  The edge was nice and smooth where it was removed.  Remaining acetabulum again appeared to be normal.  Ligament teres was removed using a Bovie.  At this point, we thoroughly irrigated and then prepared the femur using the broach only system with the DePuy Tri-Lock stem.  We broached up to a size 9 Tri-Lock and expressed the tension towards appropriate anteversion on the stem.  Once we had that size 9 standard stem in place, we placed initially a +1.5 ball and eventually went up to an 8.5 to get appropriate tensioning.  Once we had our leg lengths equal and soft tissue balancing and stability perfect, then we went ahead and removed the trial components.  We thoroughly irrigated the femur and then we went ahead and introduced the size 9 DePuy Tri-Lock stem into the femoral canal and pounded that in place.  Again trialed with the 8.5 trial neck and a 55 bipolar head and we were happy with that length and then we went ahead and selected our real bipolar head system and then impacted that on to the trunnion.  Again we were happy with our soft tissue balance and stability.  No shuck and excellent stability at 90, 90 for the hip.  We then thoroughly irrigated the wound.  We closed the wound with the capsule first, interrupted #1 Vicryl suture followed by the piriformis to the greater trochanter posteriorly right at the gluteus medius insertion and then closed the tensor fascia lata with interrupted 0 Vicryl suture figure-of-eight with the tendinous portion and then we did a running 0 Vicryl suture along the superficial fascia for the remaining portion of the muscle.  We went ahead and performed a layered subcu closure was 0 and 2-0 Vicryl followed by 4-0 Monocryl for skin and a Mepilex dressing.  The patient tolerated the surgery well.     Almedia Balls. Ranell Patrick, M.D.     SRN/MEDQ  D:  11/26/2010  T:  11/26/2010  Job:   [id number]  Electronically Signed by Malon Kindle  on 12/02/2010 07:04:18 PM

## 2010-12-05 ENCOUNTER — Ambulatory Visit: Payer: Medicare Other | Admitting: Family Medicine

## 2010-12-05 NOTE — Discharge Summary (Signed)
[name], Andrew Norton NO.:  [id number]  MEDICAL RECORD NO.:  [id number]           PATIENT TYPE:  I  LOCATION:  1439                         FACILITY:  [facility]  PHYSICIAN:  Andrew Llano, MD       DATE OF BIRTH:  Oct 03, 1926  DATE OF ADMISSION:  11/24/2010 DATE OF DISCHARGE:                        DISCHARGE SUMMARY - REFERRING   PRIMARY CARE PHYSICIAN:  Andrew Norton, M.D.  REASON FOR ADMISSION:  Hip pain.  DISCHARGE DIAGNOSES: 1. Left displaced femoral neck fracture, status post left hip     hemiarthroplasty. 2. Hyponatremia. 3. Diabetes mellitus type 2. 4. Hypertension. 5. Constipation. 6. Hyperlipidemia. 7. Diabetic peripheral neuropathy. 8. Gait instability.  DISCHARGE MEDICATIONS: 1. Ambien 5 mg p.o. daily at bedtime as needed for sleep. 2. Amlodipine 5 mg p.o. b.i.d. 3. Aspirin enteric-coated 81 mg p.o. daily. 4. Cyclobenzaprine 10 mg every 8 hours as needed for spasm. 5. Glipizide XL 5 mg p.o. daily.6. Imodium 2 mg 1 to 2 capsules every 6 hours as needed for loose     stools. 7. Lipitor 40 mg daily at bedtime. 8. Losartan 50 mg p.o. daily. 9. Metformin 500 mg p.o. b.i.d. 10.MiraLax 17 g daily as needed for constipation. 11.Neurontin 100 mg take 1 tablet 3 times daily, then 2 tablets at     bedtime. 12.Oxycodone 5 mg take 2 tablets every 6 hours as needed for pain. 13.Tylenol 325 mg take 2 tablets for pain every 4 hours as needed.  BRIEF HISTORY AND EXAMINATION:  [name] is an 77 year old male with history of type 2 diabetes mellitus, peripheral neuropathy, Paget disease.  The patient avidly trying to get dressed his walker in front of him.  He was close to his pyjama, bottom fell and when he turned, it caused him to fall over.  He hit the floor in his bedroom.  He complained of left hip pain.  Upon initial evaluation in the emergency department, showed subcapital left femoral neck fracture with varus angulation and foreshortening.  There  is mild comminution.  Chest x-ray showed COPD.  The patient admitted to the hospital for further evaluation.  RADIOLOGY DATA: 1. Abdominal x-ray on May 14 showed nonobstructive bowel gas pattern.     No free air, moderately gaseous, distended stomach, mild-to-     moderate amount of retained stools. 2. Hip portable x-ray showed well-seated left bipolar hip prosthesis. 3. On May 13, portable, bipolar hip prosthesis in  good position     without complicating features. 4. Hip left, May 11, showed subcapital left femoral neck fracture.  BRIEF HOSPITAL COURSE: 1. Left displaced femoral neck fracture status post left hip     hemiarthroplasty.  The patient, after he fell, was admitted to the     hospital for further evaluation.  The patient was seen by Dr.     Ranell Norton from orthopedic service and on May 13, the patient had left     hemiarthroplasty with acetabular cheilectomy.  The patient was     doing fine afterwards and had some sessions of physical therapy and     occupational therapy  and felt safe to be discharged to the nursing     home to resume physical therapy and occupational therapy. 2. Hyponatremia.  The patient has very chronic hyponatremia since     2009.  In 2011, his sodium went back to mid 130s.  Upon this     admission to the hospital, his sodium was 125.  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  The patient had some normal saline which raised his     sodium to 129 and on the day of discharge, sodium was 128.  The     patient is going to need fluid restriction for about 1000 mL per     day.  His sodium level as part of basic metabolic profile should be     done within 1 week.  No diuretics or other medication can cause     hyponatremia identified on his medication regimen.  His pertinent     lab on that, his urine sodium is 54.  Urine osmol 442.  His serum     osmol is 263. 3. Diabetes mellitus type 2.  His  hemoglobin A1c is 7.4.  The patient     has taken metformin and glipizide and that was switched to insulin     while he was staying in the hospital on day of discharge and that     was switched back to his home regimen. 4. Hypertension.  The patient is on amlodipine, losartan and that was     not changed.  His blood pressure was very reasonably controlled     during the whole hospital stay. 5. Discharge labs, BMP, sodium 128, potassium 4.6, chloride 94, bicarb     is 27, glucose 204, BUN is 13, creatinine is 0.6.  DISCHARGE INSTRUCTIONS: 1. Disposition, skilled nursing facility. 2. Diet, regular diet. 3. Activity as tolerated. 4. Special instructions, the patient will have fluid restrictions of     1000 mL per day of free fluids.  This is because of hyponatremia.     Andrew Llano, MD     ME/MEDQ  D:  12/01/2010  T:  12/01/2010  Job:  [id number]  cc:   Andrew Norton, M.D. Fax: [phone]  Electronically Signed by Andrew Norton  on 12/05/2010 03:36:24 PM

## 2010-12-11 NOTE — Discharge Summary (Signed)
Andrew Norton, ECKERT                   ACCOUNT NO.:  [id number]  MEDICAL RECORD NO.:  [id number]           PATIENT TYPE:  I  LOCATION:  5037                         FACILITY:  [facility]  PHYSICIAN:  Tearsa Kowalewski, DO         DATE OF BIRTH:  08/24/1926  DATE OF ADMISSION:  11/10/2010 DATE OF DISCHARGE:  11/17/2010                              DISCHARGE SUMMARY   ADMISSION DIAGNOSES: 1. Lumbar spinal stenosis and severe back pain which is intractable. 2. History of dysphasia. 3. Hypertension. 4. Diabetes mellitus type II, noninsulin-requiring. 5. Constipation. 6. Difficulty with urination. 7. Hyperlipidemia. 8. Diabetic peripheral neuropathy. 9. Gait instability.  HISTORY OF PRESENT ILLNESS:  Please see H and P.  HOSPITAL COURSE:  The patient was admitted to the hospital for pain management and for neurosurgical consultation.  The patient sees Dr. [name] and Dr. [name] was consulted.  We have continued on some medications.  A swallow eval was obtained.  Speech therapy evaluation showed the patient showed minimal signs and symptoms of dysphagia. Recommendation was for throat clear status post swallow of dual consistency food.  The patient was independent following aspiration precautions.  It is recommended that he have a regular diet with thin liquids.  Aspiration precautions include an upright posture.  Follow solids with liquids, small bites followed by small sips.  Reflux precautions and home meds in puree with liquids.  The patient was given pain control.  His blood pressure was followed and he was continued on some medications.  The patient had fingerstick blood sugars and sliding scale insulin.  Physical Therapy and Occupational Therapy were consulted.  The patient underwent transforaminal epidural steroid injection into L4 by Interventional Radiology with steroids and lidocaine.  Following day, the patient continued to have some pain and he continues to complain of pain, but he  states that it is somewhat better.  The patient does continue to get better each day.  There was some consideration given to taking the patient actually to surgery for a potential laminectomy.  However, the following day the patient was somewhat better and this plan was held for the time being.  Dr. [name] holds out the possibility of surgery in the future if the patient does not receive adequate relief from this injection.  On evaluation for a potential surgery, the patient had a chest x-ray, which showed COPD and no active disease, an EKG which showed normal sinus rhythm.  They did have Q-waves septally.  There was an echo ordered, but it was cancelled when the surgery was cancelled.  The patient had one low blood sugar at 64 on the third, his glipizide was decreased by half following this episode.  This patient will be discharged to a SNF today for continued rehab.  DISCHARGE DIAGNOSES: 1. Urinary tract infection. 2. Dysphasia, which is resolving. 3. Diabetes mellitus type 2. 4. Hypertension. 5. Lumbar spinal stenosis.  DISCHARGE MEDICATIONS: 1. Ambien 5 mg one p.o. at bedtime p.r.n. sleep. 2. Acetaminophen 325 two p.o. q.4 h. p.r.n. pain. 3. Flexeril 10 mg one p.o. q.8 h. p.r.n. spasm. 4. Oxycodone 5  mg one p.o. q.6 h. p.r.n. pain. 5. Amlodipine 5 mg one p.o. b.i.d. 6. Glipizide 5 mg one p.o. daily before meals. 7. Metformin 500 mg one p.o. b.i.d. 8. Losartan 50 mg one p.o. daily. 9. Enteric-coated aspirin 81 mg one p.o. daily. 10.Neurontin 100 mg 1-2 p.o. q.6 h. 11.Simvastatin 40 mg one p.o. at bedtime. 12.MiraLax 17 g and 8 ounces of water daily p.r.n. constipation. 13.Ibuprofen 800 mg one p.o. q.8 h. p.r.n. pain. 14.Iron sulfate 324 one p.o. at bedtime.  The patient is to follow up with his primary care doctor who is Dr. [name] in 2-4 weeks after leaving the SNF and he is to follow up with Dr. [name] in 1 month.  I have spent 42 minutes on this discharge.           ______________________________ Fran Lowes, DO     AS/MEDQ  D:  11/17/2010  T:  11/17/2010  Job:  [id number]  cc:   Dr. Lysle Morales Dr. [name]  Electronically Signed by Fran Lowes DO on 12/11/2010 11:48:11 PM

## 2010-12-24 NOTE — H&P (Signed)
Andrew Norton, Andrew Norton NO.:  [id number]  MEDICAL RECORD NO.:  [id number]           PATIENT TYPE:  I  LOCATION:  1439                         FACILITY:  [facility]  PHYSICIAN:  Massie Maroon, MD        DATE OF BIRTH:  03/25/1927  DATE OF ADMISSION:  11/24/2010 DATE OF DISCHARGE:                             HISTORY & PHYSICAL   CHIEF COMPLAINT:  Hip pain.  HISTORY OF PRESENT ILLNESS:  This 77 year old male with a history of type 2 diabetes, peripheral neuropathy, Paget disease, apparently was trying to get dressed.  His walker was in front of him, close to his side.  His pajama bottoms fell and when he turned they caused him to fall over.  He hit the floor on his bedroom.  He complained of left hip pain.  The patient was brought to the ED and hip x-ray showed a subcapital left femoral neck fracture with varus angulation and foreshortening.  There was mild comminution.  Chest x-ray showed COPD with no acute findings.  The patient was sent over actually by Dr. Pete Glatter when x-ray at [facility] apparently showed hip fracture.  The patient was evaluated by Dr. Ollen Gross tonight and he will notify his colleague who will decide on the optimal time for surgery in the a.m.  The patient is admitted for left hip fracture.  PAST MEDICAL HISTORY: 1. Hypertension. 2. Hyperlipidemia. 3. Diabetes mellitus type 2. 4. Peripheral neuropathy. 5. Paget disease. 6. Chronic kidney disease. 7. History of positive PPD, treated in the past. 8. Gait instability (sees Dr. Sandria Manly). 9. Bilateral carpal tunnel. 10.Anemia. 11.Hard of hearing  PAST SURGICAL HISTORY: 1. Fusion of cervical vertebrae, October 2009, by Dr. 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 2. Left groin surgery in 2005.  SOCIAL HISTORY:  The patient does not use tobacco or alcohol.  He has never smoked.  FAMILY HISTORY:  Positive for  diabetes.  ALLERGIES: 1. PENICILLIN - rash and welts. 2. PREDNISONE - increased blood sugar.  MEDICATIONS: 1. Oxycodone 5 mg 2 p.o. q. q.6 h p.r.n. 2. Lipitor 40 mg p.o. q.h.s. 3. Imodium 2 mg 1 to 2 p.o. q.6 h p.r.n. 4. MiraLax 17 g p.o. daily p.r.n. 5. Ibuprofen 800 mg p.o. q.8 h p.r.n. 6. Ambien 5 mg p.o. q.h.s. 7. Flexeril 10 mg p.o. q.8 h p.r.n. 8. Tylenol 325 mg 2 p.o. q.4 h p.r.n. 9. Neurontin 100 mg 1 tablet p.o. t.i.d. and then 2 p.o. q.h.s. 10.Metformin 500 mg p.o. b.i.d. 11.Losartan 50 mg p.o. q.a.m. 12.Enteric-coated aspirin 81 mg p.o. daily. 13.Glipizide XL 5 mg p.o. daily. 14.Amlodipine 5 mg p.o. b.i.d..  REVIEW OF SYSTEMS:  Negative for fever, chills.  Negative for all 10 organ systems except for pertinent positives stated above.  PHYSICAL EXAMINATION:  VITAL SIGNS:  Temperature 98.1, pulse 102, blood pressure 152/68, pulse ox is 92% on room air. HEENT:  Anicteric, EOMI, no nystagmus.  Pupils 1.5 mm, symmetric. Direct consensual near reflexes intact.  Mucous membranes moist.  NECK:  No JVD, no bruit, no thyromegaly, no adenopathy. HEART:  Regular rate and rhythm.  S1, S2.  No murmurs, gallops or rubs. LUNGS:  Clear to auscultation bilaterally. ABDOMEN:  Soft, nontender, nondistended.  Positive bowel sounds. EXTREMITIES:  No cyanosis, clubbing or edema. SKIN:  No rashes. LYMPH NODES:  No adenopathy. NEURO EXAM:  Nonfocal.  Cranial nerves II through XII intact.  Reflexes 2+, symmetric, diffuse with downgoing toes bilaterally, motor strength 5/5 in all 4 extremities, pinprick intact. MUSCULOSKELETAL:  There is pain with movement of the left hip.  LABORATORY DATA:  WBC 22.3, hemoglobin 12.4, platelet count is 229.  INR 1.03, PTT 37.  Sodium 125, potassium 5.2 (high), glucose 219, BUN 27, creatinine 0.77, AST 23, ALT 30, alk phos 71, total bilirubin 0.8. Urinalysis negative.  Repeat potassium 4.9.  Chest x-ray, COPD without any acute findings.  Hip x-ray, left  hip shows subcapital left femoral neck fracture with varus angulation and foreshortening.  There is mild comminution.  EKG, normal sinus rhythm at 90, left axis deviation, incomplete right bundle-branch block, no ST-T segment changes consistent with acute ischemia.  ASSESSMENT/PLAN: 1. Left femoral neck fracture with varus angulation and foreshortening     and mild comminution:  The patient will be admitted and Orthopedics     will be consulted.  Dr. Lequita Halt discussed surgical options with the     patient and apparently Dr. Ranell Patrick will be on over the weekend to     determine what type of course we should take with the patient.  The     patient will be made n.p.o. after midnight in case they want to     take the patient for surgery in the morning.  SCDs for DVT     prophylaxis. 2. Hyperkalemia.  resolved. 3. Hyponatremia.  Check a serum osm, cortisol, TSH, urine osm and     urine sodium, and hydrate gently with normal saline.  Low-sodium     probably is caused by pain related to hip fracture. 4. Diabetes.  Hold metformin.  Hold Amaryl.  Fingerstick blood sugars     q.4 h while n.p.o. then transition to a.c. and h.s.  NovoLog     sensitive sliding scale. 5. Hypertension.  Continue present blood pressure medication. 6. Hyperlipidemia.  Continue Lipitor. 7. Deep venous thrombosis prophylaxis.  SCDs.     Massie Maroon, MD     JYK/MEDQ  D:  11/24/2010  T:  11/25/2010  Job:  [id number]  cc:   Hal T. Stoneking, M.D. Fax: [phone]  Electronically Signed by Pearson Grippe MD on 12/24/2010 07:42:01 PM

## 2010-12-25 ENCOUNTER — Other Ambulatory Visit: Payer: Self-pay | Admitting: Neurosurgery

## 2010-12-25 DIAGNOSIS — M549 Dorsalgia, unspecified: Secondary | ICD-10-CM

## 2010-12-27 ENCOUNTER — Other Ambulatory Visit: Payer: Self-pay | Admitting: Neurosurgery

## 2010-12-27 ENCOUNTER — Ambulatory Visit
Admission: RE | Admit: 2010-12-27 | Discharge: 2010-12-27 | Disposition: A | Discharge status: Home / Self Care | Payer: Medicare Other | Source: Ambulatory Visit | Attending: Neurosurgery | Admitting: Neurosurgery

## 2010-12-27 DIAGNOSIS — M549 Dorsalgia, unspecified: Secondary | ICD-10-CM

## 2010-12-27 DIAGNOSIS — M5416 Radiculopathy, lumbar region: Secondary | ICD-10-CM

## 2011-01-22 ENCOUNTER — Ambulatory Visit (INDEPENDENT_AMBULATORY_CARE_PROVIDER_SITE_OTHER): Payer: Medicare Other | Admitting: Family Medicine

## 2011-01-22 ENCOUNTER — Encounter: Payer: Self-pay | Admitting: Family Medicine

## 2011-01-22 DIAGNOSIS — R609 Edema, unspecified: Secondary | ICD-10-CM

## 2011-01-22 DIAGNOSIS — R600 Localized edema: Secondary | ICD-10-CM

## 2011-01-22 DIAGNOSIS — E871 Hypo-osmolality and hyponatremia: Secondary | ICD-10-CM

## 2011-01-22 DIAGNOSIS — E538 Deficiency of other specified B group vitamins: Secondary | ICD-10-CM

## 2011-01-22 DIAGNOSIS — D649 Anemia, unspecified: Secondary | ICD-10-CM

## 2011-01-22 MED ORDER — CYANOCOBALAMIN 1000 MCG/ML IJ SOLN
1000.0000 ug | Freq: Once | INTRAMUSCULAR | Status: AC
  Administered 2011-01-22: 1000 ug via INTRAMUSCULAR

## 2011-01-22 NOTE — Patient Instructions (Signed)
Schedule follow up with Dr [name] within the next month.

## 2011-01-22 NOTE — Progress Notes (Signed)
  Subjective:    Patient ID: Andrew Norton, male    DOB: Aug 01, 1926, 77 y.o.   MRN: [id number]  HPI Patient seen for hospital followup. He had a fall with hip fracture back in May and underwent surgery. This involved the left femoral neck. Was discharged to rehabilitation and had some problems with left radiculopathy pains and underwent a couple of epidural injections which eventually have improved his pain significantly. Seen today with right foot and ankle swelling over the past several weeks. He denies any real right leg pain. No increased dyspnea.  Hyponatremia during hospitalization with question of SIADH. Treated with fluid restriction. Last sodium on record 130. He does not taking diuretics.  History of type 2 diabetes. Blood sugars been stable. Remains on metformin and glipizide. No hypoglycemia.  Anemia with hemoglobin 9.2 one month ago. No active bleeding issues. Patient has history of B12 deficiency receiving monthly injections and needs this today   Review of Systems  Constitutional: Negative for fever, chills and appetite change.  Respiratory: Negative for cough, shortness of breath and wheezing.   Cardiovascular: Positive for leg swelling. Negative for chest pain and palpitations.  Gastrointestinal: Negative for abdominal pain.  Neurological: Positive for weakness. Negative for dizziness and syncope.       Objective:   Physical Exam  Constitutional: He is oriented to person, place, and time. He appears well-developed and well-nourished.  HENT:  Right Ear: External ear normal.  Left Ear: External ear normal.  Mouth/Throat: Oropharynx is clear and moist. No oropharyngeal exudate.  Neck: Neck supple.  Cardiovascular: Normal rate and regular rhythm.   Pulmonary/Chest: Effort normal and breath sounds normal. No respiratory distress. He has no wheezes.  Musculoskeletal:       Patient has 1+ pitting edema right ankle and leg. No edema left leg. No calf tenderness. No color  changes right lower extremity.  Neurological: He is alert and oriented to person, place, and time.  Psychiatric: He has a normal mood and affect. His behavior is normal. Judgment and thought content normal.          Assessment & Plan:  #1 hyponatremia. Possibly related to recent fracture. Recheck basic metabolic panel #2 type 2 diabetes with history of good control.  #3 asymmetric leg edema right greater than left. Clinical suspicion low for DVT but patient increased risk. Check venous Doppler to further assess continue aspirin in the meantime #4 anemia probably exacerbated by recent surgery. Recheck CBC today

## 2011-01-26 ENCOUNTER — Telehealth: Payer: Self-pay | Admitting: *Deleted

## 2011-01-26 ENCOUNTER — Other Ambulatory Visit: Payer: Self-pay | Admitting: Family Medicine

## 2011-01-26 DIAGNOSIS — M79604 Pain in right leg: Secondary | ICD-10-CM

## 2011-01-26 NOTE — Telephone Encounter (Signed)
VM from pt around 3 pm reporting he had OV on Monday and thought Dr Caryl Never had mentioned a test to check for a blood clot in his right leg, he has not heard anything all week.  I checked in chart and Dr did order a lower right venous doppler, however checking with Terri, our scheduler and she did not receive the order.  We attempted to order and schedule, too late on a Friday night.  Per Dr Caryl Never pt may need to go ER for evaluation and doppler this weekend.  I spoke with pt and he reports his leg in about same and would prefer not to go to the ER.  I explained I have number to call Monday am to try to get him scheduled.  Pt voiced his understanding.  If sx progress, go to the ER

## 2011-01-29 ENCOUNTER — Ambulatory Visit (INDEPENDENT_AMBULATORY_CARE_PROVIDER_SITE_OTHER): Payer: Medicare Other | Admitting: Cardiology

## 2011-01-29 DIAGNOSIS — M7989 Other specified soft tissue disorders: Secondary | ICD-10-CM

## 2011-01-29 DIAGNOSIS — R600 Localized edema: Secondary | ICD-10-CM

## 2011-01-29 DIAGNOSIS — M79604 Pain in right leg: Secondary | ICD-10-CM

## 2011-01-29 NOTE — Telephone Encounter (Signed)
Doppler is scheduled for today, 7/16 at 12 noon.  Pt is aware.

## 2011-01-29 NOTE — Telephone Encounter (Signed)
I spoke with pt wife, her husband is "about the same", no ER visit this weekend.  I esplained Camelia Eng would help schedule the venous doppler and call with place and time.

## 2011-02-01 ENCOUNTER — Encounter: Payer: Self-pay | Admitting: Family Medicine

## 2011-02-01 NOTE — Progress Notes (Signed)
Quick Note:  Pt informed ______ 

## 2011-02-08 ENCOUNTER — Ambulatory Visit: Payer: Medicare Other | Attending: Orthopedic Surgery | Admitting: Physical Therapy

## 2011-02-08 DIAGNOSIS — M545 Low back pain, unspecified: Secondary | ICD-10-CM | POA: Insufficient documentation

## 2011-02-08 DIAGNOSIS — M25559 Pain in unspecified hip: Secondary | ICD-10-CM | POA: Insufficient documentation

## 2011-02-08 DIAGNOSIS — R269 Unspecified abnormalities of gait and mobility: Secondary | ICD-10-CM | POA: Insufficient documentation

## 2011-02-08 DIAGNOSIS — M2560 Stiffness of unspecified joint, not elsewhere classified: Secondary | ICD-10-CM | POA: Insufficient documentation

## 2011-02-08 DIAGNOSIS — IMO0001 Reserved for inherently not codable concepts without codable children: Secondary | ICD-10-CM | POA: Insufficient documentation

## 2011-02-09 ENCOUNTER — Telehealth: Payer: Self-pay | Admitting: Family Medicine

## 2011-02-09 NOTE — Telephone Encounter (Signed)
Pt requesting refill on cyclobenzaprine (FLEXERIL) 10 MG tablet   [facility]

## 2011-02-12 ENCOUNTER — Ambulatory Visit: Payer: Medicare Other | Admitting: Physical Therapy

## 2011-02-12 MED ORDER — CYCLOBENZAPRINE HCL 10 MG PO TABS: 10.0000 mg | ORAL_TABLET | Freq: Three times a day (TID) | ORAL | Status: DC | PRN

## 2011-02-12 NOTE — Telephone Encounter (Signed)
rx sent in electronically 

## 2011-02-12 NOTE — Telephone Encounter (Signed)
This is tid prn spasm, call in #[phone] with 5 rf

## 2011-02-14 ENCOUNTER — Ambulatory Visit: Payer: Medicare Other | Attending: Orthopedic Surgery | Admitting: Physical Therapy

## 2011-02-14 DIAGNOSIS — M545 Low back pain, unspecified: Secondary | ICD-10-CM | POA: Insufficient documentation

## 2011-02-14 DIAGNOSIS — R269 Unspecified abnormalities of gait and mobility: Secondary | ICD-10-CM | POA: Insufficient documentation

## 2011-02-14 DIAGNOSIS — M25559 Pain in unspecified hip: Secondary | ICD-10-CM | POA: Insufficient documentation

## 2011-02-14 DIAGNOSIS — M2560 Stiffness of unspecified joint, not elsewhere classified: Secondary | ICD-10-CM | POA: Insufficient documentation

## 2011-02-14 DIAGNOSIS — IMO0001 Reserved for inherently not codable concepts without codable children: Secondary | ICD-10-CM | POA: Insufficient documentation

## 2011-02-19 ENCOUNTER — Ambulatory Visit: Payer: Medicare Other

## 2011-02-21 ENCOUNTER — Ambulatory Visit: Payer: Medicare Other | Admitting: Physical Therapy

## 2011-02-23 ENCOUNTER — Ambulatory Visit: Payer: Medicare Other | Admitting: Family Medicine

## 2011-02-27 ENCOUNTER — Ambulatory Visit: Payer: Medicare Other | Admitting: Physical Therapy

## 2011-03-01 ENCOUNTER — Ambulatory Visit (INDEPENDENT_AMBULATORY_CARE_PROVIDER_SITE_OTHER): Payer: Medicare Other | Admitting: Family Medicine

## 2011-03-01 ENCOUNTER — Ambulatory Visit: Payer: Medicare Other | Admitting: Physical Therapy

## 2011-03-01 ENCOUNTER — Encounter: Payer: Self-pay | Admitting: Family Medicine

## 2011-03-01 VITALS — BP 126/70 | HR 86 | Temp 98.1°F | Ht 70.0 in | Wt 154.0 lb

## 2011-03-01 DIAGNOSIS — E119 Type 2 diabetes mellitus without complications: Secondary | ICD-10-CM

## 2011-03-01 DIAGNOSIS — N139 Obstructive and reflux uropathy, unspecified: Secondary | ICD-10-CM

## 2011-03-01 DIAGNOSIS — E538 Deficiency of other specified B group vitamins: Secondary | ICD-10-CM

## 2011-03-01 DIAGNOSIS — M5430 Sciatica, unspecified side: Secondary | ICD-10-CM

## 2011-03-01 DIAGNOSIS — N138 Other obstructive and reflux uropathy: Secondary | ICD-10-CM

## 2011-03-01 DIAGNOSIS — N401 Enlarged prostate with lower urinary tract symptoms: Secondary | ICD-10-CM

## 2011-03-01 LAB — CBC WITH DIFFERENTIAL/PLATELET
Basophils Relative: 0.5 % (ref 0.0–3.0)
Eosinophils Relative: 2.7 % (ref 0.0–5.0)
Hemoglobin: 10 g/dL — ABNORMAL LOW (ref 13.0–17.0)
Lymphocytes Relative: 28.3 % (ref 12.0–46.0)
MCHC: 33 g/dL (ref 30.0–36.0)
Neutro Abs: 5.3 10*3/uL (ref 1.4–7.7)
Neutrophils Relative %: 59.6 % (ref 43.0–77.0)
RBC: 3.37 Mil/uL — ABNORMAL LOW (ref 4.22–5.81)
WBC: 8.9 10*3/uL (ref 4.5–10.5)

## 2011-03-01 LAB — LIPID PANEL
Cholesterol: 98 mg/dL (ref 0–200)
HDL: 50.1 mg/dL (ref >39.00)
LDL Cholesterol: 34 mg/dL (ref 0–99)
Triglycerides: 70 mg/dL (ref 0.0–149.0)
VLDL: 14 mg/dL (ref 0.0–40.0)

## 2011-03-01 LAB — VITAMIN B12: Vitamin B-12: 384 pg/mL (ref 211–911)

## 2011-03-01 LAB — HEPATIC FUNCTION PANEL
ALT: 27 U/L (ref 0–53)
Bilirubin, Direct: 0.1 mg/dL (ref 0.0–0.3)
Total Protein: 6.9 g/dL (ref 6.0–8.3)

## 2011-03-01 LAB — BASIC METABOLIC PANEL
Chloride: 98 mEq/L (ref 96–112)
Creatinine, Ser: 0.6 mg/dL (ref 0.4–1.5)
Potassium: 5.4 mEq/L — ABNORMAL HIGH (ref 3.5–5.1)
Sodium: 133 mEq/L — ABNORMAL LOW (ref 135–145)

## 2011-03-01 LAB — POCT URINALYSIS DIPSTICK
Bilirubin, UA: NEGATIVE
Blood, UA: NEGATIVE
Ketones, UA: NEGATIVE
Leukocytes, UA: NEGATIVE
Nitrite, UA: NEGATIVE
pH, UA: 7

## 2011-03-01 MED ORDER — CYANOCOBALAMIN 1000 MCG/ML IJ SOLN
1000.0000 ug | Freq: Once | INTRAMUSCULAR | Status: AC
  Administered 2011-03-01: 1000 ug via INTRAMUSCULAR

## 2011-03-01 NOTE — Progress Notes (Signed)
  Subjective:    Patient ID: Andrew Norton, male    DOB: 31-May-1927, 77 y.o.   MRN: [id number]  HPI 77 yr old male for a checkup and also to follow up on an eventful summer for him. In May he had a partial left hip replacement after he fell and broke the hip. He than had a rehab stay at [facility] in June. The hip has healed fairly well, and he gets around with his walker reasonably well with minimal pain. However he has also struggled with low back pain which radiates down the right leg. He ha shad 2 ESI procedures in the past 3 months. The first one dod not help much but the second one has been fairly successful in relieving the pain. From a general health standpoint otherwise he has done well.   Review of Systems  Constitutional: Negative.   HENT: Negative.   Eyes: Negative.   Respiratory: Negative.   Cardiovascular: Negative.   Gastrointestinal: Negative.   Genitourinary: Negative.   Musculoskeletal: Negative.   Skin: Negative.   Neurological: Negative.   Hematological: Negative.   Psychiatric/Behavioral: Negative.        Objective:   Physical Exam  Constitutional: He is oriented to person, place, and time. He appears well-developed and well-nourished. No distress.  HENT:  Head: Normocephalic and atraumatic.  Right Ear: External ear normal.  Left Ear: External ear normal.  Nose: Nose normal.  Mouth/Throat: Oropharynx is clear and moist. No oropharyngeal exudate.  Eyes: Conjunctivae and EOM are normal. Pupils are equal, round, and reactive to light. Right eye exhibits no discharge. Left eye exhibits no discharge. No scleral icterus.  Neck: Neck supple. No JVD present. No tracheal deviation present. No thyromegaly present.  Cardiovascular: Normal rate, regular rhythm, normal heart sounds and intact distal pulses.  Exam reveals no gallop and no friction rub.   No murmur heard. Pulmonary/Chest: Effort normal and breath sounds normal. No respiratory distress. He has no wheezes. He has  no rales. He exhibits no tenderness.  Abdominal: Soft. Bowel sounds are normal. He exhibits no distension and no mass. There is no tenderness. There is no rebound and no guarding.  Genitourinary: Rectum normal, prostate normal and penis normal. Guaiac negative stool. No penile tenderness.  Musculoskeletal: Normal range of motion. He exhibits no edema and no tenderness.  Lymphadenopathy:    He has no cervical adenopathy.  Neurological: He is alert and oriented to person, place, and time. He has normal reflexes. No cranial nerve deficit. He exhibits normal muscle tone. Coordination normal.  Skin: Skin is warm and dry. No rash noted. He is not diaphoretic. No erythema. No pallor.  Psychiatric: He has a normal mood and affect. His behavior is normal. Judgment and thought content normal.          Assessment & Plan:  Get fasting labs today. We reviewed dietary and exercise measures to follow. He will continue with at home PT.

## 2011-03-05 ENCOUNTER — Ambulatory Visit: Payer: Medicare Other

## 2011-03-06 ENCOUNTER — Telehealth: Payer: Self-pay

## 2011-03-06 NOTE — Progress Notes (Signed)
Pt aware. Please see phone note

## 2011-03-06 NOTE — Telephone Encounter (Signed)
Pt aware.

## 2011-03-07 ENCOUNTER — Ambulatory Visit: Payer: Medicare Other

## 2011-03-12 ENCOUNTER — Ambulatory Visit: Payer: Medicare Other

## 2011-03-14 ENCOUNTER — Ambulatory Visit: Payer: Medicare Other

## 2011-03-20 ENCOUNTER — Ambulatory Visit: Payer: Medicare Other | Attending: Orthopedic Surgery | Admitting: Physical Therapy

## 2011-03-20 DIAGNOSIS — M2560 Stiffness of unspecified joint, not elsewhere classified: Secondary | ICD-10-CM | POA: Insufficient documentation

## 2011-03-20 DIAGNOSIS — M545 Low back pain, unspecified: Secondary | ICD-10-CM | POA: Insufficient documentation

## 2011-03-20 DIAGNOSIS — M25559 Pain in unspecified hip: Secondary | ICD-10-CM | POA: Insufficient documentation

## 2011-03-20 DIAGNOSIS — IMO0001 Reserved for inherently not codable concepts without codable children: Secondary | ICD-10-CM | POA: Insufficient documentation

## 2011-03-20 DIAGNOSIS — R269 Unspecified abnormalities of gait and mobility: Secondary | ICD-10-CM | POA: Insufficient documentation

## 2011-03-26 ENCOUNTER — Ambulatory Visit: Payer: Medicare Other | Admitting: Physical Therapy

## 2011-03-28 ENCOUNTER — Ambulatory Visit: Payer: Medicare Other

## 2011-03-31 ENCOUNTER — Other Ambulatory Visit: Payer: Self-pay | Admitting: Family Medicine

## 2011-04-02 ENCOUNTER — Ambulatory Visit: Payer: Medicare Other

## 2011-04-03 ENCOUNTER — Ambulatory Visit (INDEPENDENT_AMBULATORY_CARE_PROVIDER_SITE_OTHER): Payer: Medicare Other | Admitting: Family Medicine

## 2011-04-03 ENCOUNTER — Other Ambulatory Visit: Payer: Self-pay | Admitting: Family Medicine

## 2011-04-03 DIAGNOSIS — E539 Vitamin B deficiency, unspecified: Secondary | ICD-10-CM

## 2011-04-03 MED ORDER — CYANOCOBALAMIN 1000 MCG/ML IJ SOLN
1000.0000 ug | Freq: Once | INTRAMUSCULAR | Status: AC
  Administered 2011-04-03: 1000 ug via INTRAMUSCULAR

## 2011-04-03 NOTE — Telephone Encounter (Signed)
Script sent e-scribe 

## 2011-04-04 ENCOUNTER — Ambulatory Visit: Payer: Medicare Other

## 2011-04-11 ENCOUNTER — Other Ambulatory Visit: Payer: Self-pay | Admitting: Family Medicine

## 2011-04-16 LAB — CBC
HCT: 34.2 — ABNORMAL LOW
HCT: 35.9 — ABNORMAL LOW
Hemoglobin: 10.6 — ABNORMAL LOW
Hemoglobin: 11.6 — ABNORMAL LOW
Hemoglobin: 12.4 — ABNORMAL LOW
MCHC: 33.7
MCHC: 33.8
MCHC: 33.9
MCV: 94.9
Platelets: 219
Platelets: 234
RBC: 3.27 — ABNORMAL LOW
RBC: 3.6 — ABNORMAL LOW
RDW: 13.2
RDW: 13.7
WBC: 5.4
WBC: 8.6

## 2011-04-16 LAB — GLUCOSE, CAPILLARY
Glucose-Capillary: 123 — ABNORMAL HIGH
Glucose-Capillary: 139 — ABNORMAL HIGH
Glucose-Capillary: 140 — ABNORMAL HIGH
Glucose-Capillary: 151 — ABNORMAL HIGH
Glucose-Capillary: 171 — ABNORMAL HIGH
Glucose-Capillary: 171 — ABNORMAL HIGH
Glucose-Capillary: 176 — ABNORMAL HIGH
Glucose-Capillary: 177 — ABNORMAL HIGH
Glucose-Capillary: 178 — ABNORMAL HIGH
Glucose-Capillary: 179 — ABNORMAL HIGH
Glucose-Capillary: 182 — ABNORMAL HIGH
Glucose-Capillary: 188 — ABNORMAL HIGH
Glucose-Capillary: 199 — ABNORMAL HIGH
Glucose-Capillary: 207 — ABNORMAL HIGH
Glucose-Capillary: 209 — ABNORMAL HIGH
Glucose-Capillary: 223 — ABNORMAL HIGH
Glucose-Capillary: 226 — ABNORMAL HIGH
Glucose-Capillary: 226 — ABNORMAL HIGH
Glucose-Capillary: 234 — ABNORMAL HIGH
Glucose-Capillary: 239 — ABNORMAL HIGH
Glucose-Capillary: 248 — ABNORMAL HIGH
Glucose-Capillary: 264 — ABNORMAL HIGH
Glucose-Capillary: 271 — ABNORMAL HIGH
Glucose-Capillary: 299 — ABNORMAL HIGH
Glucose-Capillary: 307 — ABNORMAL HIGH
Glucose-Capillary: 58 — ABNORMAL LOW

## 2011-04-16 LAB — BASIC METABOLIC PANEL
BUN: 16
BUN: 20
CO2: 25
CO2: 28
CO2: 29
Calcium: 8.1 — ABNORMAL LOW
Calcium: 8.5
Calcium: 8.6
Calcium: 8.6
Chloride: 102
Chloride: 103
Chloride: 95 — ABNORMAL LOW
Creatinine, Ser: 0.89
Creatinine, Ser: 0.96
Creatinine, Ser: 0.97
GFR calc Af Amer: >60
GFR calc Af Amer: >60
GFR calc Af Amer: >60
GFR calc Af Amer: >60
GFR calc Af Amer: >60
GFR calc Af Amer: >60
GFR calc non Af Amer: >60
GFR calc non Af Amer: >60
GFR calc non Af Amer: >60
GFR calc non Af Amer: >60
GFR calc non Af Amer: >60
Glucose, Bld: 279 — ABNORMAL HIGH
Potassium: 4.5
Potassium: 4.5
Potassium: 4.7
Potassium: 4.9
Sodium: 131 — ABNORMAL LOW
Sodium: 131 — ABNORMAL LOW
Sodium: 135
Sodium: 136

## 2011-04-16 LAB — CARDIAC PANEL(CRET KIN+CKTOT+MB+TROPI)
CK, MB: 3.5
Total CK: 297 — ABNORMAL HIGH
Troponin I: 0.01

## 2011-04-16 LAB — COMPREHENSIVE METABOLIC PANEL
AST: 23
Albumin: 2.8 — ABNORMAL LOW
Alkaline Phosphatase: 48
BUN: 27 — ABNORMAL HIGH
Chloride: 98
Potassium: 5.1
Total Bilirubin: 0.7

## 2011-04-16 LAB — CK TOTAL AND CKMB (NOT AT ARMC)
CK, MB: 3.3
Relative Index: 1.5
Total CK: 220
Total CK: 361 — ABNORMAL HIGH

## 2011-04-16 LAB — DIFFERENTIAL
Basophils Absolute: 0
Basophils Relative: 0
Eosinophils Absolute: 0
Eosinophils Relative: 0
Monocytes Absolute: 0.4
Neutro Abs: 7.3

## 2011-04-16 LAB — CK ISOENZYMES: Creatine Kinase-Total: 198 U/L — ABNORMAL HIGH (ref 44–196)

## 2011-04-16 LAB — TROPONIN I: Troponin I: <0.01

## 2011-04-16 LAB — MAGNESIUM
Magnesium: 1.9
Magnesium: 2

## 2011-04-17 LAB — BASIC METABOLIC PANEL
BUN: 22
BUN: 40 — ABNORMAL HIGH
BUN: 43 — ABNORMAL HIGH
BUN: 46 — ABNORMAL HIGH
CO2: 24
CO2: 27
CO2: 27
Calcium: 8.1 — ABNORMAL LOW
Calcium: 8.5
Chloride: 94 — ABNORMAL LOW
Chloride: 97
Creatinine, Ser: 0.94
Creatinine, Ser: 0.99
Creatinine, Ser: 1.1
Creatinine, Ser: 1.22
GFR calc Af Amer: >60
GFR calc Af Amer: >60
GFR calc non Af Amer: >60
GFR calc non Af Amer: >60
Glucose, Bld: 161 — ABNORMAL HIGH
Glucose, Bld: 178 — ABNORMAL HIGH
Potassium: 4.4
Potassium: 5.1
Sodium: 129 — ABNORMAL LOW
Sodium: 130 — ABNORMAL LOW

## 2011-04-17 LAB — GLUCOSE, CAPILLARY
Glucose-Capillary: 103 — ABNORMAL HIGH
Glucose-Capillary: 107 — ABNORMAL HIGH
Glucose-Capillary: 126 — ABNORMAL HIGH
Glucose-Capillary: 149 — ABNORMAL HIGH
Glucose-Capillary: 152 — ABNORMAL HIGH
Glucose-Capillary: 161 — ABNORMAL HIGH
Glucose-Capillary: 166 — ABNORMAL HIGH
Glucose-Capillary: 174 — ABNORMAL HIGH
Glucose-Capillary: 181 — ABNORMAL HIGH
Glucose-Capillary: 182 — ABNORMAL HIGH
Glucose-Capillary: 198 — ABNORMAL HIGH
Glucose-Capillary: 198 — ABNORMAL HIGH
Glucose-Capillary: 218 — ABNORMAL HIGH
Glucose-Capillary: 226 — ABNORMAL HIGH
Glucose-Capillary: 227 — ABNORMAL HIGH
Glucose-Capillary: 230 — ABNORMAL HIGH
Glucose-Capillary: 251 — ABNORMAL HIGH
Glucose-Capillary: 254 — ABNORMAL HIGH
Glucose-Capillary: 267 — ABNORMAL HIGH
Glucose-Capillary: 348 — ABNORMAL HIGH
Glucose-Capillary: 354 — ABNORMAL HIGH

## 2011-04-17 LAB — CBC
HCT: 39.3
Hemoglobin: 13.7
MCHC: 33.9
MCHC: 35
MCV: 94.1
Platelets: 112 — ABNORMAL LOW
Platelets: 135 — ABNORMAL LOW
RBC: 3.95 — ABNORMAL LOW
RBC: 4.24
RDW: 13.4
WBC: 10.9 — ABNORMAL HIGH
WBC: 11.3 — ABNORMAL HIGH
WBC: 8

## 2011-04-17 LAB — URINE CULTURE: Colony Count: >=100000

## 2011-04-17 LAB — URINALYSIS, ROUTINE W REFLEX MICROSCOPIC
Bilirubin Urine: NEGATIVE
Ketones, ur: NEGATIVE
Specific Gravity, Urine: 1.018
Urobilinogen, UA: 1

## 2011-04-17 LAB — URINE MICROSCOPIC-ADD ON

## 2011-04-25 ENCOUNTER — Ambulatory Visit (INDEPENDENT_AMBULATORY_CARE_PROVIDER_SITE_OTHER): Payer: Medicare Other | Admitting: Family Medicine

## 2011-04-25 DIAGNOSIS — E538 Deficiency of other specified B group vitamins: Secondary | ICD-10-CM

## 2011-04-25 DIAGNOSIS — Z23 Encounter for immunization: Secondary | ICD-10-CM

## 2011-04-25 MED ORDER — CYANOCOBALAMIN 1000 MCG/ML IJ SOLN
1000.0000 ug | Freq: Once | INTRAMUSCULAR | Status: AC
  Administered 2011-04-25: 1000 ug via INTRAMUSCULAR

## 2011-05-30 ENCOUNTER — Encounter: Payer: Self-pay | Admitting: Family Medicine

## 2011-05-30 ENCOUNTER — Ambulatory Visit (INDEPENDENT_AMBULATORY_CARE_PROVIDER_SITE_OTHER): Payer: Medicare Other | Admitting: Family Medicine

## 2011-05-30 VITALS — BP 134/62 | HR 93 | Temp 97.8°F | Wt 166.0 lb

## 2011-05-30 DIAGNOSIS — E538 Deficiency of other specified B group vitamins: Secondary | ICD-10-CM

## 2011-05-30 DIAGNOSIS — I10 Essential (primary) hypertension: Secondary | ICD-10-CM

## 2011-05-30 DIAGNOSIS — E539 Vitamin B deficiency, unspecified: Secondary | ICD-10-CM

## 2011-05-30 DIAGNOSIS — M2550 Pain in unspecified joint: Secondary | ICD-10-CM

## 2011-05-30 DIAGNOSIS — M545 Low back pain: Secondary | ICD-10-CM

## 2011-05-30 DIAGNOSIS — E119 Type 2 diabetes mellitus without complications: Secondary | ICD-10-CM

## 2011-05-30 MED ORDER — LIDOCAINE 5 % EX PTCH: 1.0000 | MEDICATED_PATCH | CUTANEOUS | Status: DC

## 2011-05-30 MED ORDER — CYANOCOBALAMIN 1000 MCG/ML IJ SOLN
1000.0000 ug | Freq: Once | INTRAMUSCULAR | Status: AC
  Administered 2011-05-30: 1000 ug via INTRAMUSCULAR

## 2011-05-30 NOTE — Progress Notes (Signed)
  Subjective:    Patient ID: Andrew Norton, male    DOB: 1927/07/07, 77 y.o.   MRN: [id number]  HPI Here for follow up. In general he is doing quite well. He no longer sees Dr. [name], and his neck and arms are working well. He got a second ESI to the lumbar spine yesterday per Dr. Ethelene Hal. His glucoses have been stable although this am it had shot up to 338, which is expected after the steroid shot. He needs refills on Lidoderm patches.    Review of Systems  Constitutional: Negative.   Respiratory: Negative.   Cardiovascular: Negative.   Musculoskeletal: Positive for arthralgias.       Objective:   Physical Exam  Constitutional: He appears well-developed and well-nourished.  Cardiovascular: Normal rate, regular rhythm, normal heart sounds and intact distal pulses.   Pulmonary/Chest: Effort normal and breath sounds normal.          Assessment & Plan:  Doing well. Recheck in 3 months with fasting labs

## 2011-05-30 NOTE — Progress Notes (Signed)
Addended by: Aniceto Boss A on: 05/30/2011 12:08 PM   Modules accepted: Orders

## 2011-06-19 ENCOUNTER — Other Ambulatory Visit: Payer: Self-pay | Admitting: Family Medicine

## 2011-06-22 ENCOUNTER — Other Ambulatory Visit: Payer: Self-pay | Admitting: Internal Medicine

## 2011-07-04 ENCOUNTER — Other Ambulatory Visit: Payer: Self-pay | Admitting: Specialist

## 2011-07-04 DIAGNOSIS — M48061 Spinal stenosis, lumbar region without neurogenic claudication: Secondary | ICD-10-CM

## 2011-07-06 ENCOUNTER — Ambulatory Visit
Admission: RE | Admit: 2011-07-06 | Discharge: 2011-07-06 | Disposition: A | Discharge status: Home / Self Care | Payer: Medicare Other | Source: Ambulatory Visit | Attending: Specialist | Admitting: Specialist

## 2011-07-06 DIAGNOSIS — M545 Low back pain: Secondary | ICD-10-CM

## 2011-07-06 DIAGNOSIS — M48061 Spinal stenosis, lumbar region without neurogenic claudication: Secondary | ICD-10-CM

## 2011-07-06 MED ORDER — IOHEXOL 180 MG/ML  SOLN
17.0000 mL | Freq: Once | INTRAMUSCULAR | Status: AC | PRN
Start: 2011-07-06 — End: 2011-07-06
  Administered 2011-07-06: 17 mL via INTRATHECAL

## 2011-07-06 MED ORDER — DIAZEPAM 5 MG PO TABS
5.0000 mg | ORAL_TABLET | Freq: Once | ORAL | Status: AC
  Administered 2011-07-06: 5 mg via ORAL

## 2011-07-06 NOTE — Progress Notes (Signed)
Denies pain at present; resting quietly.  jkl

## 2011-07-06 NOTE — Patient Instructions (Signed)

## 2011-07-06 NOTE — Progress Notes (Signed)
Wife at bedside.  jkl

## 2011-07-23 ENCOUNTER — Ambulatory Visit (INDEPENDENT_AMBULATORY_CARE_PROVIDER_SITE_OTHER): Payer: Medicare Other | Admitting: Family Medicine

## 2011-07-23 DIAGNOSIS — E538 Deficiency of other specified B group vitamins: Secondary | ICD-10-CM

## 2011-07-23 MED ORDER — CYANOCOBALAMIN 1000 MCG/ML IJ SOLN
1000.0000 ug | Freq: Once | INTRAMUSCULAR | Status: AC
  Administered 2011-07-23: 1000 ug via INTRAMUSCULAR

## 2011-07-24 DIAGNOSIS — M48061 Spinal stenosis, lumbar region without neurogenic claudication: Secondary | ICD-10-CM | POA: Diagnosis not present

## 2011-08-15 DIAGNOSIS — IMO0002 Reserved for concepts with insufficient information to code with codable children: Secondary | ICD-10-CM | POA: Diagnosis not present

## 2011-08-15 DIAGNOSIS — M48061 Spinal stenosis, lumbar region without neurogenic claudication: Secondary | ICD-10-CM | POA: Diagnosis not present

## 2011-08-21 DIAGNOSIS — M48061 Spinal stenosis, lumbar region without neurogenic claudication: Secondary | ICD-10-CM | POA: Diagnosis not present

## 2011-08-21 DIAGNOSIS — M5137 Other intervertebral disc degeneration, lumbosacral region: Secondary | ICD-10-CM | POA: Diagnosis not present

## 2011-08-24 DIAGNOSIS — M48061 Spinal stenosis, lumbar region without neurogenic claudication: Secondary | ICD-10-CM | POA: Diagnosis not present

## 2011-08-27 ENCOUNTER — Other Ambulatory Visit: Payer: Self-pay | Admitting: Family Medicine

## 2011-08-27 ENCOUNTER — Ambulatory Visit (INDEPENDENT_AMBULATORY_CARE_PROVIDER_SITE_OTHER): Payer: Medicare Other | Admitting: Family Medicine

## 2011-08-27 DIAGNOSIS — E539 Vitamin B deficiency, unspecified: Secondary | ICD-10-CM

## 2011-08-27 MED ORDER — CYANOCOBALAMIN 1000 MCG/ML IJ SOLN
1000.0000 ug | Freq: Once | INTRAMUSCULAR | Status: AC
  Administered 2011-08-27: 1000 ug via INTRAMUSCULAR

## 2011-08-28 DIAGNOSIS — M48061 Spinal stenosis, lumbar region without neurogenic claudication: Secondary | ICD-10-CM | POA: Diagnosis not present

## 2011-08-31 DIAGNOSIS — M48061 Spinal stenosis, lumbar region without neurogenic claudication: Secondary | ICD-10-CM | POA: Diagnosis not present

## 2011-09-04 DIAGNOSIS — M48061 Spinal stenosis, lumbar region without neurogenic claudication: Secondary | ICD-10-CM | POA: Diagnosis not present

## 2011-09-07 DIAGNOSIS — M48061 Spinal stenosis, lumbar region without neurogenic claudication: Secondary | ICD-10-CM | POA: Diagnosis not present

## 2011-09-11 DIAGNOSIS — M48061 Spinal stenosis, lumbar region without neurogenic claudication: Secondary | ICD-10-CM | POA: Diagnosis not present

## 2011-09-14 DIAGNOSIS — M48061 Spinal stenosis, lumbar region without neurogenic claudication: Secondary | ICD-10-CM | POA: Diagnosis not present

## 2011-09-18 DIAGNOSIS — M48061 Spinal stenosis, lumbar region without neurogenic claudication: Secondary | ICD-10-CM | POA: Diagnosis not present

## 2011-09-24 ENCOUNTER — Ambulatory Visit (INDEPENDENT_AMBULATORY_CARE_PROVIDER_SITE_OTHER): Payer: Medicare Other | Admitting: Family Medicine

## 2011-09-24 DIAGNOSIS — E538 Deficiency of other specified B group vitamins: Secondary | ICD-10-CM

## 2011-09-24 MED ORDER — CYANOCOBALAMIN 1000 MCG/ML IJ SOLN
1000.0000 ug | Freq: Once | INTRAMUSCULAR | Status: AC
  Administered 2011-09-24: 1000 ug via INTRAMUSCULAR

## 2011-10-03 ENCOUNTER — Encounter: Payer: Self-pay | Admitting: Family Medicine

## 2011-10-03 ENCOUNTER — Ambulatory Visit (INDEPENDENT_AMBULATORY_CARE_PROVIDER_SITE_OTHER): Payer: Medicare Other | Admitting: Family Medicine

## 2011-10-03 VITALS — BP 140/76 | HR 72 | Temp 97.6°F | Wt 180.0 lb

## 2011-10-03 DIAGNOSIS — M5430 Sciatica, unspecified side: Secondary | ICD-10-CM | POA: Diagnosis not present

## 2011-10-03 DIAGNOSIS — M545 Low back pain: Secondary | ICD-10-CM | POA: Diagnosis not present

## 2011-10-03 NOTE — Progress Notes (Signed)
  Subjective:    Patient ID: Andrew Norton, male    DOB: Dec 09, 1926, 78 y.o.   MRN: [id number]  HPI Here with his wife to ask advice about having a surgery in the lumbar spine to relieve sciatic pain. He has been seeing Dr. Blanca Friend for this, and he has tried PT and several rounds of epidural steroid shots and attempts at nerve blocks. These have not been successful. He recently had a CT myelogram showing significant spinal stenosis, complicated by diffuse degenerative changes, bone spurring, and vertebral compression fractures. Dr. Shelle Iron suggested surgery and asked [place] to clear him medically. Dvid is very hesitant to go through with this, and I understand given the postoperative problems he had several years ago with his cervical spine surgery. He is in a lot of pain done the left leg, and he prefers to avoid much pain medication if possible.    Review of Systems  Constitutional: Negative.   Respiratory: Negative.   Cardiovascular: Negative.   Musculoskeletal: Positive for back pain.       Objective:   Physical Exam  Constitutional: He appears well-developed and well-nourished.       Walks slowly with a cane           Assessment & Plan:  We had a long discussion about the pros and cons of surgery. I reminded him of possible nonsurgical ways to control pain like cold or radio wave ablations or even accupuncture. My advice was to see a neurosurgeon to get a second opinion first, as to what the chances of success or failure with a surgery might be. Then he could make a better decision about what to do. He and his wife will consider this and get back to me.

## 2011-10-08 DIAGNOSIS — M48061 Spinal stenosis, lumbar region without neurogenic claudication: Secondary | ICD-10-CM | POA: Diagnosis not present

## 2011-10-09 ENCOUNTER — Other Ambulatory Visit: Payer: Self-pay | Admitting: Otolaryngology

## 2011-10-12 DIAGNOSIS — M4714 Other spondylosis with myelopathy, thoracic region: Secondary | ICD-10-CM | POA: Diagnosis not present

## 2011-10-12 DIAGNOSIS — IMO0002 Reserved for concepts with insufficient information to code with codable children: Secondary | ICD-10-CM | POA: Diagnosis not present

## 2011-10-12 DIAGNOSIS — M48061 Spinal stenosis, lumbar region without neurogenic claudication: Secondary | ICD-10-CM | POA: Diagnosis not present

## 2011-10-24 ENCOUNTER — Ambulatory Visit (INDEPENDENT_AMBULATORY_CARE_PROVIDER_SITE_OTHER): Payer: Medicare Other | Admitting: Family Medicine

## 2011-10-24 DIAGNOSIS — E539 Vitamin B deficiency, unspecified: Secondary | ICD-10-CM

## 2011-10-24 MED ORDER — CYANOCOBALAMIN 1000 MCG/ML IJ SOLN
1000.0000 ug | Freq: Once | INTRAMUSCULAR | Status: AC
  Administered 2011-10-24: 1000 ug via INTRAMUSCULAR

## 2011-10-31 ENCOUNTER — Encounter (HOSPITAL_COMMUNITY): Payer: Self-pay | Admitting: Pharmacy Technician

## 2011-10-31 DIAGNOSIS — E119 Type 2 diabetes mellitus without complications: Secondary | ICD-10-CM | POA: Diagnosis not present

## 2011-11-02 ENCOUNTER — Encounter (HOSPITAL_COMMUNITY)
Admission: RE | Admit: 2011-11-02 | Discharge: 2011-11-02 | Disposition: A | Discharge status: Home / Self Care | Payer: Medicare Other | Source: Ambulatory Visit | Attending: Specialist | Admitting: Specialist

## 2011-11-02 ENCOUNTER — Encounter (HOSPITAL_COMMUNITY): Payer: Self-pay

## 2011-11-02 ENCOUNTER — Ambulatory Visit (HOSPITAL_COMMUNITY)
Admission: RE | Admit: 2011-11-02 | Discharge: 2011-11-02 | Disposition: A | Discharge status: Home / Self Care | Payer: Medicare Other | Source: Ambulatory Visit | Attending: Otolaryngology | Admitting: Otolaryngology

## 2011-11-02 DIAGNOSIS — M47817 Spondylosis without myelopathy or radiculopathy, lumbosacral region: Secondary | ICD-10-CM | POA: Diagnosis not present

## 2011-11-02 DIAGNOSIS — Z01818 Encounter for other preprocedural examination: Secondary | ICD-10-CM | POA: Insufficient documentation

## 2011-11-02 DIAGNOSIS — M47814 Spondylosis without myelopathy or radiculopathy, thoracic region: Secondary | ICD-10-CM | POA: Insufficient documentation

## 2011-11-02 DIAGNOSIS — Z01812 Encounter for preprocedural laboratory examination: Secondary | ICD-10-CM | POA: Insufficient documentation

## 2011-11-02 HISTORY — DX: Unspecified osteoarthritis, unspecified site: M19.90

## 2011-11-02 LAB — URINALYSIS, ROUTINE W REFLEX MICROSCOPIC
Hgb urine dipstick: NEGATIVE
Leukocytes, UA: NEGATIVE
Specific Gravity, Urine: 1.014 (ref 1.005–1.030)
Urobilinogen, UA: 1 mg/dL (ref 0.0–1.0)

## 2011-11-02 LAB — DIFFERENTIAL
Basophils Relative: 1 % (ref 0–1)
Lymphs Abs: 2 10*3/uL (ref 0.7–4.0)
Monocytes Relative: 11 % (ref 3–12)
Neutro Abs: 3.4 10*3/uL (ref 1.7–7.7)
Neutrophils Relative %: 51 % (ref 43–77)

## 2011-11-02 LAB — CBC
HCT: 32.5 % — ABNORMAL LOW (ref 39.0–52.0)
MCH: 29.7 pg (ref 26.0–34.0)
MCHC: 32.9 g/dL (ref 30.0–36.0)
MCV: 90.3 fL (ref 78.0–100.0)
RDW: 13.3 % (ref 11.5–15.5)

## 2011-11-02 LAB — COMPREHENSIVE METABOLIC PANEL
Albumin: 4 g/dL (ref 3.5–5.2)
BUN: 17 mg/dL (ref 6–23)
Creatinine, Ser: 0.76 mg/dL (ref 0.50–1.35)
GFR calc Af Amer: >90 mL/min (ref >90)
Total Bilirubin: 0.3 mg/dL (ref 0.3–1.2)
Total Protein: 7.1 g/dL (ref 6.0–8.3)

## 2011-11-02 LAB — SURGICAL PCR SCREEN
MRSA, PCR: NEGATIVE
Staphylococcus aureus: NEGATIVE

## 2011-11-02 NOTE — Patient Instructions (Signed)
20 RESHAWN OSTLUND  11/02/2011   Your procedure is scheduled on:  11/07/11  Surgery 1100-1300  [facility]  Report to [facility] at    0830   AM.  Call this number if you have problems the morning of surgery: [phone]     Or PST   [id number]  [facility]   Remember:              DO NOT TAKE ANY BLOOD SUGAR MEDICINE MORNING OF SURGERY  Do not eat food  Or drink liquids :After Midnight.  Tuesday NIGHT                EAT SNACK BEFORE BEDTIME Tuesday NIGHT    Clear liquids include soda, tea, black coffee, apple or grape juice, broth.  Take these medicines the morning of surgery with A SIP OF WATER: NEURONTIN   Do not wear jewelry, make-up or nail polish.  Do not wear lotions, powders, or perfumes. You may wear deodorant.  Do not shave 48 hours prior to surgery.  Do not bring valuables to the hospital.  Contacts, dentures or bridgework may not be worn into surgery.  Leave suitcase in the car. After surgery it may be brought to your room.  For patients admitted to the hospital, checkout time is 11:00 AM the day of discharge.   Patients discharged the day of surgery will not be allowed to drive home.  Name and phone number of your driver:      Wife Myriam Jacobson                                                                Special Instructions: CHG Shower Use Special Wash: 1/2 bottle night before surgery and 1/2 bottle morning of surgery. REGULAR SOAP FACE AND PRIVATES            Please read over the following fact sheets that you were given: MRSA Information

## 2011-11-05 NOTE — Pre-Procedure Instructions (Signed)
Notified Candace at [facility] who stated she would have to send a message to provider regarding abnormal potassium. States was delivered to intra office  inbox for review

## 2011-11-07 ENCOUNTER — Ambulatory Visit (HOSPITAL_COMMUNITY): Payer: Medicare Other

## 2011-11-07 ENCOUNTER — Encounter (HOSPITAL_COMMUNITY): Payer: Self-pay | Admitting: *Deleted

## 2011-11-07 ENCOUNTER — Ambulatory Visit (HOSPITAL_COMMUNITY): Payer: Medicare Other | Admitting: Anesthesiology

## 2011-11-07 ENCOUNTER — Encounter (HOSPITAL_COMMUNITY)
Admission: RE | Disposition: A | Discharge status: Home / Self Care | Payer: Self-pay | Source: Ambulatory Visit | Attending: Specialist

## 2011-11-07 ENCOUNTER — Encounter (HOSPITAL_COMMUNITY): Payer: Self-pay | Admitting: Anesthesiology

## 2011-11-07 ENCOUNTER — Observation Stay (HOSPITAL_COMMUNITY)
Admission: RE | Admit: 2011-11-07 | Discharge: 2011-11-09 | Disposition: A | Discharge status: Home / Self Care | Payer: Medicare Other | Source: Ambulatory Visit | Attending: Specialist | Admitting: Specialist

## 2011-11-07 DIAGNOSIS — M549 Dorsalgia, unspecified: Secondary | ICD-10-CM | POA: Diagnosis not present

## 2011-11-07 DIAGNOSIS — R209 Unspecified disturbances of skin sensation: Secondary | ICD-10-CM | POA: Insufficient documentation

## 2011-11-07 DIAGNOSIS — M48061 Spinal stenosis, lumbar region without neurogenic claudication: Principal | ICD-10-CM | POA: Insufficient documentation

## 2011-11-07 DIAGNOSIS — Z8673 Personal history of transient ischemic attack (TIA), and cerebral infarction without residual deficits: Secondary | ICD-10-CM | POA: Diagnosis not present

## 2011-11-07 DIAGNOSIS — R29898 Other symptoms and signs involving the musculoskeletal system: Secondary | ICD-10-CM | POA: Diagnosis not present

## 2011-11-07 DIAGNOSIS — E119 Type 2 diabetes mellitus without complications: Secondary | ICD-10-CM | POA: Insufficient documentation

## 2011-11-07 DIAGNOSIS — D649 Anemia, unspecified: Secondary | ICD-10-CM | POA: Diagnosis not present

## 2011-11-07 DIAGNOSIS — M545 Low back pain: Secondary | ICD-10-CM

## 2011-11-07 DIAGNOSIS — E785 Hyperlipidemia, unspecified: Secondary | ICD-10-CM | POA: Diagnosis not present

## 2011-11-07 DIAGNOSIS — Z01812 Encounter for preprocedural laboratory examination: Secondary | ICD-10-CM | POA: Diagnosis not present

## 2011-11-07 DIAGNOSIS — Z981 Arthrodesis status: Secondary | ICD-10-CM | POA: Diagnosis not present

## 2011-11-07 DIAGNOSIS — IMO0002 Reserved for concepts with insufficient information to code with codable children: Secondary | ICD-10-CM | POA: Diagnosis not present

## 2011-11-07 DIAGNOSIS — N289 Disorder of kidney and ureter, unspecified: Secondary | ICD-10-CM | POA: Diagnosis not present

## 2011-11-07 DIAGNOSIS — Z79899 Other long term (current) drug therapy: Secondary | ICD-10-CM | POA: Diagnosis not present

## 2011-11-07 DIAGNOSIS — I10 Essential (primary) hypertension: Secondary | ICD-10-CM | POA: Diagnosis not present

## 2011-11-07 DIAGNOSIS — M519 Unspecified thoracic, thoracolumbar and lumbosacral intervertebral disc disorder: Secondary | ICD-10-CM | POA: Diagnosis not present

## 2011-11-07 DIAGNOSIS — I6789 Other cerebrovascular disease: Secondary | ICD-10-CM | POA: Diagnosis not present

## 2011-11-07 HISTORY — PX: LUMBAR LAMINECTOMY/DECOMPRESSION MICRODISCECTOMY: SHX5026

## 2011-11-07 LAB — GLUCOSE, CAPILLARY
Glucose-Capillary: 168 mg/dL — ABNORMAL HIGH (ref 70–99)
Glucose-Capillary: 115 mg/dL — ABNORMAL HIGH (ref 70–99)
Glucose-Capillary: 161 mg/dL — ABNORMAL HIGH (ref 70–99)

## 2011-11-07 SURGERY — LUMBAR LAMINECTOMY/DECOMPRESSION MICRODISCECTOMY
Anesthesia: General | Site: Back | Wound class: Clean

## 2011-11-07 MED ORDER — VANCOMYCIN HCL 1000 MG IV SOLR
750.0000 mg | Freq: Once | INTRAVENOUS | Status: AC
  Administered 2011-11-08: 750 mg via INTRAVENOUS
  Filled 2011-11-07: qty 750

## 2011-11-07 MED ORDER — GABAPENTIN 100 MG PO CAPS: 200.0000 mg | ORAL_CAPSULE | ORAL | Status: DC

## 2011-11-07 MED ORDER — ACETAMINOPHEN 650 MG RE SUPP: 650.0000 mg | RECTAL | Status: DC | PRN

## 2011-11-07 MED ORDER — POLYETHYLENE GLYCOL 3350 17 G PO PACK
17.0000 g | PACK | Freq: Every day | ORAL | Status: DC | PRN
  Administered 2011-11-07 – 2011-11-08 (×2): 17 g via ORAL
  Filled 2011-11-07: qty 1

## 2011-11-07 MED ORDER — LACTATED RINGERS IV SOLN: INTRAVENOUS | Status: DC

## 2011-11-07 MED ORDER — CIPROFLOXACIN IN D5W 400 MG/200ML IV SOLN
INTRAVENOUS | Status: AC
  Filled 2011-11-07: qty 200

## 2011-11-07 MED ORDER — THROMBIN 5000 UNITS EX SOLR
CUTANEOUS | Status: AC
  Filled 2011-11-07: qty 10000

## 2011-11-07 MED ORDER — HYDROCODONE-ACETAMINOPHEN 5-325 MG PO TABS: 1.0000 | ORAL_TABLET | ORAL | Status: DC | PRN

## 2011-11-07 MED ORDER — ONDANSETRON HCL 4 MG/2ML IJ SOLN: 4.0000 mg | INTRAMUSCULAR | Status: DC | PRN

## 2011-11-07 MED ORDER — SODIUM CHLORIDE 0.9 % IJ SOLN: 3.0000 mL | INTRAMUSCULAR | Status: DC | PRN

## 2011-11-07 MED ORDER — FERROUS SULFATE 300 (60 FE) MG/5ML PO SYRP
300.0000 mg | ORAL_SOLUTION | Freq: Every day | ORAL | Status: DC
  Administered 2011-11-07 – 2011-11-08 (×2): 300 mg via ORAL
  Filled 2011-11-07 (×3): qty 5

## 2011-11-07 MED ORDER — GLIPIZIDE 10 MG PO TABS
10.0000 mg | ORAL_TABLET | Freq: Every day | ORAL | Status: DC
  Administered 2011-11-08 – 2011-11-09 (×2): 10 mg via ORAL
  Filled 2011-11-07 (×3): qty 1

## 2011-11-07 MED ORDER — SODIUM CHLORIDE 0.9 % IR SOLN
Status: DC | PRN
  Administered 2011-11-07: 12:00:00

## 2011-11-07 MED ORDER — PROPOFOL 10 MG/ML IV EMUL
INTRAVENOUS | Status: DC | PRN
  Administered 2011-11-07: 130 mg via INTRAVENOUS

## 2011-11-07 MED ORDER — VANCOMYCIN HCL IN DEXTROSE 1-5 GM/200ML-% IV SOLN
INTRAVENOUS | Status: AC
  Filled 2011-11-07: qty 200

## 2011-11-07 MED ORDER — INSULIN ASPART 100 UNIT/ML ~~LOC~~ SOLN
0.0000 [IU] | Freq: Three times a day (TID) | SUBCUTANEOUS | Status: DC
  Administered 2011-11-08 (×3): 2 [IU] via SUBCUTANEOUS
  Administered 2011-11-09: 3 [IU] via SUBCUTANEOUS

## 2011-11-07 MED ORDER — CHLORHEXIDINE GLUCONATE 4 % EX LIQD
60.0000 mL | Freq: Once | CUTANEOUS | Status: DC
  Filled 2011-11-07: qty 60

## 2011-11-07 MED ORDER — ROCURONIUM BROMIDE 100 MG/10ML IV SOLN
INTRAVENOUS | Status: DC | PRN
  Administered 2011-11-07: 40 mg via INTRAVENOUS

## 2011-11-07 MED ORDER — VANCOMYCIN HCL IN DEXTROSE 1-5 GM/200ML-% IV SOLN
1000.0000 mg | INTRAVENOUS | Status: AC
  Administered 2011-11-07: 1000 mg via INTRAVENOUS

## 2011-11-07 MED ORDER — MENTHOL 3 MG MT LOZG
1.0000 | LOZENGE | OROMUCOSAL | Status: DC | PRN
  Filled 2011-11-07: qty 9

## 2011-11-07 MED ORDER — SIMVASTATIN 40 MG PO TABS
40.0000 mg | ORAL_TABLET | Freq: Every day | ORAL | Status: DC
  Administered 2011-11-07 – 2011-11-08 (×2): 40 mg via ORAL
  Filled 2011-11-07 (×3): qty 1

## 2011-11-07 MED ORDER — GLYCOPYRROLATE 0.2 MG/ML IJ SOLN
INTRAMUSCULAR | Status: DC | PRN
  Administered 2011-11-07: .3 mg via INTRAVENOUS

## 2011-11-07 MED ORDER — LACTATED RINGERS IV SOLN
INTRAVENOUS | Status: DC | PRN
  Administered 2011-11-07: 11:00:00 via INTRAVENOUS

## 2011-11-07 MED ORDER — NEOSTIGMINE METHYLSULFATE 1 MG/ML IJ SOLN
INTRAMUSCULAR | Status: DC | PRN
  Administered 2011-11-07: 2 mg via INTRAVENOUS

## 2011-11-07 MED ORDER — ACETAMINOPHEN 325 MG PO TABS
650.0000 mg | ORAL_TABLET | ORAL | Status: DC | PRN
  Administered 2011-11-07 – 2011-11-09 (×6): 650 mg via ORAL
  Filled 2011-11-07 (×6): qty 2

## 2011-11-07 MED ORDER — METFORMIN HCL 500 MG PO TABS
500.0000 mg | ORAL_TABLET | Freq: Two times a day (BID) | ORAL | Status: DC
  Administered 2011-11-07 – 2011-11-09 (×4): 500 mg via ORAL
  Filled 2011-11-07 (×6): qty 1

## 2011-11-07 MED ORDER — CIPROFLOXACIN IN D5W 400 MG/200ML IV SOLN
INTRAVENOUS | Status: DC | PRN
  Administered 2011-11-07: 400 mg via INTRAVENOUS

## 2011-11-07 MED ORDER — LIDOCAINE HCL (CARDIAC) 20 MG/ML IV SOLN
INTRAVENOUS | Status: DC | PRN
  Administered 2011-11-07: 50 mg via INTRAVENOUS

## 2011-11-07 MED ORDER — LACTATED RINGERS IV SOLN
INTRAVENOUS | Status: DC
  Administered 2011-11-07: 1000 mL via INTRAVENOUS

## 2011-11-07 MED ORDER — MIDAZOLAM HCL 5 MG/5ML IJ SOLN
INTRAMUSCULAR | Status: DC | PRN
  Administered 2011-11-07: 1 mg via INTRAVENOUS

## 2011-11-07 MED ORDER — SODIUM CHLORIDE 0.9 % IV SOLN: 250.0000 mL | INTRAVENOUS | Status: DC

## 2011-11-07 MED ORDER — CYCLOBENZAPRINE HCL 10 MG PO TABS
10.0000 mg | ORAL_TABLET | Freq: Three times a day (TID) | ORAL | Status: DC | PRN
  Administered 2011-11-08: 10 mg via ORAL
  Filled 2011-11-07: qty 1

## 2011-11-07 MED ORDER — POLYETHYLENE GLYCOL 3350 17 GM/SCOOP PO POWD
17.0000 g | Freq: Every day | ORAL | Status: DC
  Administered 2011-11-07 – 2011-11-09 (×3): 17 g via ORAL
  Filled 2011-11-07 (×2): qty 255

## 2011-11-07 MED ORDER — BISACODYL 5 MG PO TBEC: 5.0000 mg | DELAYED_RELEASE_TABLET | Freq: Every day | ORAL | Status: DC | PRN

## 2011-11-07 MED ORDER — THROMBIN 5000 UNITS EX SOLR
CUTANEOUS | Status: DC | PRN
  Administered 2011-11-07: 10000 [IU] via TOPICAL

## 2011-11-07 MED ORDER — BUPIVACAINE-EPINEPHRINE 0.5% -1:200000 IJ SOLN
INTRAMUSCULAR | Status: AC
  Filled 2011-11-07: qty 1

## 2011-11-07 MED ORDER — HYDROMORPHONE HCL PF 1 MG/ML IJ SOLN: 0.5000 mg | INTRAMUSCULAR | Status: DC | PRN

## 2011-11-07 MED ORDER — GABAPENTIN 300 MG PO CAPS
300.0000 mg | ORAL_CAPSULE | Freq: Three times a day (TID) | ORAL | Status: DC
  Administered 2011-11-07 – 2011-11-09 (×5): 300 mg via ORAL
  Filled 2011-11-07 (×8): qty 1

## 2011-11-07 MED ORDER — ONDANSETRON HCL 4 MG/2ML IJ SOLN
INTRAMUSCULAR | Status: DC | PRN
  Administered 2011-11-07: 4 mg via INTRAVENOUS

## 2011-11-07 MED ORDER — SODIUM CHLORIDE 0.9 % IJ SOLN
3.0000 mL | Freq: Two times a day (BID) | INTRAMUSCULAR | Status: DC
  Administered 2011-11-07 – 2011-11-08 (×2): 3 mL via INTRAVENOUS

## 2011-11-07 MED ORDER — LOSARTAN POTASSIUM 50 MG PO TABS
50.0000 mg | ORAL_TABLET | Freq: Every morning | ORAL | Status: DC
Start: 2011-11-08 — End: 2011-11-09
  Administered 2011-11-08 – 2011-11-09 (×2): 50 mg via ORAL
  Filled 2011-11-07 (×2): qty 1

## 2011-11-07 MED ORDER — SODIUM CHLORIDE 0.9 % IV SOLN: INTRAVENOUS | Status: DC

## 2011-11-07 MED ORDER — SUFENTANIL CITRATE 50 MCG/ML IV SOLN
INTRAVENOUS | Status: DC | PRN
  Administered 2011-11-07 (×2): 5 ug via INTRAVENOUS
  Administered 2011-11-07 (×2): 10 ug via INTRAVENOUS

## 2011-11-07 MED ORDER — ACETAMINOPHEN 10 MG/ML IV SOLN
INTRAVENOUS | Status: AC
  Filled 2011-11-07: qty 100

## 2011-11-07 MED ORDER — CIPROFLOXACIN IN D5W 400 MG/200ML IV SOLN
400.0000 mg | Freq: Two times a day (BID) | INTRAVENOUS | Status: AC
  Administered 2011-11-07 – 2011-11-08 (×2): 400 mg via INTRAVENOUS
  Filled 2011-11-07 (×2): qty 200

## 2011-11-07 MED ORDER — HYDROMORPHONE HCL PF 1 MG/ML IJ SOLN: 0.2500 mg | INTRAMUSCULAR | Status: DC | PRN

## 2011-11-07 MED ORDER — GABAPENTIN 100 MG PO CAPS
200.0000 mg | ORAL_CAPSULE | Freq: Every day | ORAL | Status: DC
  Administered 2011-11-07 – 2011-11-08 (×2): 200 mg via ORAL
  Filled 2011-11-07 (×3): qty 2

## 2011-11-07 MED ORDER — FERROUS SULFATE 220 (44 FE) MG/5ML PO ELIX
220.0000 mg | ORAL_SOLUTION | Freq: Every day | ORAL | Status: DC
  Filled 2011-11-07: qty 5

## 2011-11-07 MED ORDER — BUPIVACAINE-EPINEPHRINE 0.5% -1:200000 IJ SOLN
INTRAMUSCULAR | Status: DC | PRN
  Administered 2011-11-07: 6 mL

## 2011-11-07 MED ORDER — ACETAMINOPHEN 10 MG/ML IV SOLN
INTRAVENOUS | Status: DC | PRN
  Administered 2011-11-07: 1000 mg via INTRAVENOUS

## 2011-11-07 MED ORDER — PHENOL 1.4 % MT LIQD: 1.0000 | OROMUCOSAL | Status: DC | PRN

## 2011-11-07 MED ORDER — DOCUSATE SODIUM 100 MG PO CAPS
100.0000 mg | ORAL_CAPSULE | Freq: Two times a day (BID) | ORAL | Status: DC
  Administered 2011-11-07: 100 mg via ORAL
  Filled 2011-11-07 (×3): qty 1

## 2011-11-07 SURGICAL SUPPLY — 50 items
BAG ZIPLOCK 12X15 (MISCELLANEOUS) ×2 IMPLANT
BENZOIN TINCTURE PRP APPL 2/3 (GAUZE/BANDAGES/DRESSINGS) ×4 IMPLANT
CHLORAPREP W/TINT 26ML (MISCELLANEOUS) IMPLANT
CLEANER TIP ELECTROSURG 2X2 (MISCELLANEOUS) ×2 IMPLANT
CLOTH BEACON ORANGE TIMEOUT ST (SAFETY) ×2 IMPLANT
DECANTER SPIKE VIAL GLASS SM (MISCELLANEOUS) ×2 IMPLANT
DRAPE MICROSCOPE LEICA (MISCELLANEOUS) ×2 IMPLANT
DRAPE POUCH INSTRU U-SHP 10X18 (DRAPES) ×2 IMPLANT
DRAPE SURG 17X11 SM STRL (DRAPES) ×2 IMPLANT
DRSG EMULSION OIL 3X3 NADH (GAUZE/BANDAGES/DRESSINGS) ×2 IMPLANT
DRSG PAD ABDOMINAL 8X10 ST (GAUZE/BANDAGES/DRESSINGS) ×2 IMPLANT
DRSG TELFA 4X5 ISLAND ADH (GAUZE/BANDAGES/DRESSINGS) IMPLANT
DURAPREP 26ML APPLICATOR (WOUND CARE) ×2 IMPLANT
ELECT REM PT RETURN 9FT ADLT (ELECTROSURGICAL) ×2
ELECTRODE REM PT RTRN 9FT ADLT (ELECTROSURGICAL) ×1 IMPLANT
GLOVE BIOGEL PI IND STRL 8 (GLOVE) ×1 IMPLANT
GLOVE BIOGEL PI INDICATOR 8 (GLOVE) ×1
GLOVE ECLIPSE 6.5 STRL STRAW (GLOVE) ×2 IMPLANT
GLOVE INDICATOR 6.5 STRL GRN (GLOVE) ×4 IMPLANT
GLOVE SURG SS PI 8.0 STRL IVOR (GLOVE) ×4 IMPLANT
GOWN PREVENTION PLUS LG XLONG (DISPOSABLE) ×2 IMPLANT
GOWN STRL REIN XL XLG (GOWN DISPOSABLE) ×2 IMPLANT
KIT BASIN OR (CUSTOM PROCEDURE TRAY) ×2 IMPLANT
KIT POSITIONING SURG ANDREWS (MISCELLANEOUS) ×2 IMPLANT
MANIFOLD NEPTUNE II (INSTRUMENTS) ×2 IMPLANT
NEEDLE SPNL 18GX3.5 QUINCKE PK (NEEDLE) ×6 IMPLANT
PATTIES SURGICAL .5 X.5 (GAUZE/BANDAGES/DRESSINGS) IMPLANT
PATTIES SURGICAL .75X.75 (GAUZE/BANDAGES/DRESSINGS) IMPLANT
PATTIES SURGICAL 1X1 (DISPOSABLE) IMPLANT
SPONGE GAUZE 4X4 12PLY (GAUZE/BANDAGES/DRESSINGS) ×2 IMPLANT
SPONGE SURGIFOAM ABS GEL 100 (HEMOSTASIS) ×2 IMPLANT
STAPLER VISISTAT (STAPLE) IMPLANT
STRIP CLOSURE SKIN 1/2X4 (GAUZE/BANDAGES/DRESSINGS) IMPLANT
SUT PROLENE 3 0 PS 2 (SUTURE) IMPLANT
SUT VIC AB 0 CT1 27 (SUTURE)
SUT VIC AB 0 CT1 27XBRD ANTBC (SUTURE) IMPLANT
SUT VIC AB 1 CT1 27 (SUTURE) ×1
SUT VIC AB 1 CT1 27XBRD ANTBC (SUTURE) ×1 IMPLANT
SUT VIC AB 1-0 CT2 27 (SUTURE) IMPLANT
SUT VIC AB 2-0 CT1 27 (SUTURE) ×1
SUT VIC AB 2-0 CT1 TAPERPNT 27 (SUTURE) ×1 IMPLANT
SUT VIC AB 2-0 CT2 27 (SUTURE) ×4 IMPLANT
SUT VICRYL 0 27 CT2 27 ABS (SUTURE) ×4 IMPLANT
SUT VICRYL 0 UR6 27IN ABS (SUTURE) IMPLANT
SYRINGE 10CC LL (SYRINGE) ×4 IMPLANT
TAPE CLOTH SURG 6X10 WHT LF (GAUZE/BANDAGES/DRESSINGS) ×2 IMPLANT
TOWEL OR 17X26 10 PK STRL BLUE (TOWEL DISPOSABLE) ×4 IMPLANT
TOWEL OR NON WOVEN STRL DISP B (DISPOSABLE) ×2 IMPLANT
TRAY LAMINECTOMY (CUSTOM PROCEDURE TRAY) ×2 IMPLANT
YANKAUER SUCT BULB TIP NO VENT (SUCTIONS) ×2 IMPLANT

## 2011-11-07 NOTE — Anesthesia Preprocedure Evaluation (Signed)
Anesthesia Evaluation  Patient identified by MRN, date of birth, ID band Patient awake    Reviewed: Allergy & Precautions, H&P , NPO status , Patient's Chart, lab work & pertinent test results  Airway Mallampati: II TM Distance: >3 FB Neck ROM: full    Dental No notable dental hx.    Pulmonary neg pulmonary ROS,  breath sounds clear to auscultation  Pulmonary exam normal       Cardiovascular hypertension, Pt. 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  Also cervical fusion.  Neuromuscular disease CVA negative psych ROS   GI/Hepatic negative GI ROS, Neg liver ROS,   Endo/Other  Diabetes mellitus-, Well Controlled, Type 2, Oral Hypoglycemic Agents  Renal/GU Renal InsufficiencyRenal disease  negative genitourinary   Musculoskeletal   Abdominal   Peds  Hematology negative hematology ROS (+)   Anesthesia Other Findings   Reproductive/Obstetrics negative OB ROS                           Anesthesia Physical Anesthesia Plan  ASA: III  Anesthesia Plan: General   Post-op Pain Management:    Induction: Intravenous  Airway Management Planned: Oral ETT  Additional Equipment:   Intra-op Plan:   Post-operative Plan:   Informed Consent: I have reviewed the patients History and Physical, chart, labs and discussed the procedure including the risks, benefits and alternatives for the proposed anesthesia with the patient or authorized representative who has indicated his/her understanding and acceptance.   Dental Advisory Given  Plan Discussed with: CRNA  Anesthesia Plan Comments:         Anesthesia Quick Evaluation

## 2011-11-07 NOTE — Transfer of Care (Signed)
Immediate Anesthesia Transfer of Care Note  Patient: Andrew Norton  Procedure(s) Performed: Procedure(s) (LRB): LUMBAR LAMINECTOMY/DECOMPRESSION MICRODISCECTOMY (N/A)  Patient Location: PACU  Anesthesia Type: General  Level of Consciousness: awake, alert , oriented and patient cooperative  Airway & Oxygen Therapy: Patient Spontanous Breathing and Patient connected to face mask oxygen  Post-op Assessment: Report given to PACU RN, Post -op Vital signs reviewed and stable and Patient moving all extremities  Post vital signs: Reviewed and stable  Complications: No apparent anesthesia complications

## 2011-11-07 NOTE — Anesthesia Postprocedure Evaluation (Signed)
  Anesthesia Post-op Note  Patient: Andrew Norton  Procedure(s) Performed: Procedure(s) (LRB): LUMBAR LAMINECTOMY/DECOMPRESSION MICRODISCECTOMY (N/A)  Patient Location: PACU  Anesthesia Type: General  Level of Consciousness: awake and alert   Airway and Oxygen Therapy: Patient Spontanous Breathing  Post-op Pain: mild  Post-op Assessment: Post-op Vital signs reviewed, Patient's Cardiovascular Status Stable, Respiratory Function Stable, Patent Airway and No signs of Nausea or vomiting  Post-op Vital Signs: stable  Complications: No apparent anesthesia complications 

## 2011-11-07 NOTE — Anesthesia Postprocedure Evaluation (Signed)
  Anesthesia Post-op Note  Patient: Andrew Norton  Procedure(s) Performed: Procedure(s) (LRB): LUMBAR LAMINECTOMY/DECOMPRESSION MICRODISCECTOMY (N/A)  Patient Location: PACU  Anesthesia Type: General  Level of Consciousness: awake and alert   Airway and Oxygen Therapy: Patient Spontanous Breathing  Post-op Pain: mild  Post-op Assessment: Post-op Vital signs reviewed, Patient's Cardiovascular Status Stable, Respiratory Function Stable, Patent Airway and No signs of Nausea or vomiting  Post-op Vital Signs: stable  Complications: No apparent anesthesia complications

## 2011-11-07 NOTE — Brief Op Note (Signed)
11/07/2011  1:05 PM  PATIENT:  Andrew Norton  78 y.o. male  PRE-OPERATIVE DIAGNOSIS:  spinal stenosis  POST-OPERATIVE DIAGNOSIS:  spinal stenosis  PROCEDURE:  Procedure(s) (LRB): LUMBAR LAMINECTOMY/DECOMPRESSION MICRODISCECTOMY (N/A)  SURGEON:  Surgeon(s) and Role:    * Javier Docker, MD - Primary  PHYSICIAN ASSISTANT:   ASSISTANTS: strader   ANESTHESIA:   general  EBL:     BLOOD ADMINISTERED:none  DRAINS: none   LOCAL MEDICATIONS USED:  MARCAINE     SPECIMEN:  No Specimen  DISPOSITION OF SPECIMEN:  N/A  COUNTS:  YES  TOURNIQUET:  * No tourniquets in log *  DICTATION: .Other Dictation: Dictation Number [id number]  PLAN OF CARE: Admit to inpatient   PATIENT DISPOSITION:  PACU - hemodynamically stable.   Delay start of Pharmacological VTE agent (>24hrs) due to surgical blood loss or risk of bleeding: yes

## 2011-11-07 NOTE — H&P (Signed)
Andrew Norton is an 78 y.o. male.   Chief Complaint: bilat leg pain HPI: stenosis bilat L45  Past Medical History  Diagnosis Date  . Anemia   . Diabetes mellitus   . Hyperlipidemia   . Renal insufficiency   . Paget's disease     scrotum  . Positive PPD, treated   . Hypertension     EKG, chest x ray 11/15/10 EPIC,  clearance Dr Abran Cantor on chart/ pt states on meds to "prevent hypertension from diabetes"  . Carpal tunnel syndrome     left leg sciatica  . Arthritis   . Easy bruising   . Stroke     SPINAL CORD STROKE PER OV note 3/13  DR LOVE CLEARANCE NOTE_  ON CHART WITH RECOMMENDATIONS-   has weakness  upper extremities    Past Surgical History  Procedure Date  . Tonsillectomy   . Partial removal of scrotum   . Cervical fusion     dr [name] 2009  . Hip fracture surgery 11-24-10    left hip per Dr, Malon Kindle  . Colonoscopy 2-08    per Dr. Randa Evens, normal   . Eye surgery     bilateral cataract extraction with IOL    Family History  Problem Relation Age of Onset  . Tuberculosis     Social History:  reports that he has never smoked. He has never used smokeless tobacco. He reports that he does not drink alcohol or use illicit drugs.  Allergies:  Allergies  Allergen Reactions  . Penicillins Hives    No prescriptions prior to admission    No results found for this or any previous visit (from the past 48 hour(s)). No results found.  Review of Systems  Musculoskeletal: Positive for back pain.  Neurological: Positive for sensory change and focal weakness.  All other systems reviewed and are negative.    There were no vitals taken for this visit. Physical Exam  Vitals reviewed. Constitutional: He appears well-developed.  HENT:  Head: Normocephalic.  Eyes: Pupils are equal, round, and reactive to light.  Neck: Normal range of motion.  Cardiovascular: Normal rate.   Respiratory: Effort normal.  GI: Soft.  Musculoskeletal:       SLR positive bilat. EHL 5-/5 bilat    Neurological: He is alert.  Skin: Skin is warm and dry.  Psychiatric: He has a normal mood and affect.     Assessment/Plan Stenosis L45 refractory. Plan decompression bilat. Risks discussed.  [name] C 11/07/2011, 7:32 AM

## 2011-11-07 NOTE — Progress Notes (Signed)
ANTIBIOTIC CONSULT NOTE - INITIAL  Pharmacy Consult for Vancomycin Indication:  Post-op infection prophylaxis following lumbar laminectomy/decompression microdiscectomy  Allergies  Allergen Reactions  . Penicillins Hives    Patient Measurements: Height: 6' (182.9 cm) Weight: 175 lb (79.379 kg) (stated) IBW/kg (Calculated) : 77.6   Labs: 11/02/11:  BUN 17  SCr 0.76  WBCs 6.7 Estimated Creatinine Clearance: 76.8 ml/min (by C-G formula based on Cr of 0.76).  Microbiology: Recent Results (from the past 720 hour(s))  SURGICAL PCR SCREEN     Status: Normal   Collection Time   11/02/11  2:37 PM      Component Value Range Status Comment   MRSA, PCR NEGATIVE  NEGATIVE  Final    Staphylococcus aureus NEGATIVE  NEGATIVE  Final    Assessment: Asked to order one dose of Vancomycin to be given 12 hours post-op for an 78 year-old male with allergy to penicillin.  (No drains were placed, according to the Brief Op note.)  Vancomycin 1000 mg was given in the OR at 11:22 today.  Goal of Therapy:  Prevention of post-op infection  Plan:  Vancomycin 750 mg at 12 midnight 11/07/11.  Polo Riley R.Ph. 11/07/2011,5:46 PM

## 2011-11-08 LAB — GLUCOSE, CAPILLARY
Glucose-Capillary: 131 mg/dL — ABNORMAL HIGH (ref 70–99)
Glucose-Capillary: 127 mg/dL — ABNORMAL HIGH (ref 70–99)

## 2011-11-08 MED ORDER — DOCUSATE SODIUM 50 MG/5ML PO LIQD
100.0000 mg | Freq: Two times a day (BID) | ORAL | Status: DC
  Administered 2011-11-09: 100 mg via ORAL
  Filled 2011-11-08 (×4): qty 10

## 2011-11-08 NOTE — Evaluation (Signed)
Physical Therapy Evaluation Patient Details Name: Andrew Norton MRN: [id number] DOB: Aug 28, 1926 Today's Date: 11/08/2011 Time: 1134-     PT Assessment / Plan / Recommendation Clinical Impression  pt will bene fit from PT to maximize independence for home with wife    PT Assessment  Patient needs continued PT services    Follow Up Recommendations  Home health PT    Equipment Recommendations  3 in 1 bedside comode;None recommended by PT    Frequency Min 6X/week    Precautions / Restrictions Precautions Precautions: Back Precaution Booklet Issued: Yes (comment) Restrictions Other Position/Activity Restrictions: log roll   Pertinent Vitals/Pain       Mobility  Bed Mobility Bed Mobility: Rolling Left;Left Sidelying to Sit Rolling Left: 4: Min assist;With rail Left Sidelying to Sit: 4: Min guard;With rails;HOB flat Sit to Sidelying Left: 3: Mod assist Details for Bed Mobility Assistance: Educated pt in log roll. cues for sequencing, hand placement. Pt required physical A for trunk.; mod assist with LEs to return to S/L Transfers Transfers: Sit to Stand;Stand to Sit Sit to Stand: 4: Min assist;From bed;With upper extremity assist Stand to Sit: 4: Min assist;To bed;With upper extremity assist Details for Transfer Assistance: cues for technique and hand placement. Ambulation/Gait Ambulation/Gait Assistance: 4: Min assist;4: Min Government social research officer (Feet): 50 Feet Assistive device: Rolling walker Ambulation/Gait Assistance Details: cues for RW safety and posture;  Gait Pattern: Step-to pattern;Step-through pattern Gait velocity: decreased General Gait Details: slightly flexed trunk, cervical flexion at baseline    Exercises     PT Goals Acute Rehab PT Goals PT Goal Formulation: With patient Time For Goal Achievement: 11/13/11 Potential to Achieve Goals: Good Pt will Roll Supine to Left Side: with supervision PT Goal: Rolling Supine to Left Side - Progress: Goal  set today Pt will go Supine/Side to Sit: with supervision PT Goal: Supine/Side to Sit - Progress: Goal set today Pt will go Sit to Supine/Side: with min assist PT Goal: Sit to Supine/Side - Progress: Goal set today Pt will go Sit to Stand: with supervision PT Goal: Sit to Stand - Progress: Goal set today Pt will go Stand to Sit: with supervision PT Goal: Stand to Sit - Progress: Goal set today Pt will Ambulate: 51 - 150 feet;with supervision;with rolling walker PT Goal: Ambulate - Progress: Goal set today Pt will Go Up / Down Stairs: with min assist;3-5 stairs;with least restrictive assistive device;with rail(s) PT Goal: Up/Down Stairs - Progress: Goal set today  Visit Information  Last PT Received On: 11/08/11 Assistance Needed: +1 (for basic transfers.)    Subjective Data  Subjective: it is painful Patient Stated Goal: i would like to go up stairs   Prior Functioning  Home Living Lives With: Spouse Available Help at Discharge: Family Type of Home: House Home Access: Stairs to enter Secretary/administrator of Steps: 4 Entrance Stairs-Rails: Right;Left Home Layout: Two level (only has half bath downstairs) Alternate Level Stairs-Number of Steps: 13 Alternate Level Stairs-Rails: Left;Right;Can reach both Bathroom Shower/Tub: Health visitor: Standard Home Adaptive Equipment: Shower chair without back;Grab bars in shower;Straight cane;Walker - rolling Additional Comments: pt would like to go upstairs to bedroom but has rented a hospital bed for downstairs temporarily Prior Function Level of Independence: Independent Able to Take Stairs?: Yes Driving: Yes Vocation: Retired Musician: No difficulties    Cognition  Overall Cognitive Status: Appears within functional limits for tasks assessed/performed Arousal/Alertness: Awake/alert Orientation Level: Appears intact for tasks assessed Behavior During Session: [facility]  for tasks performed      Extremity/Trunk Assessment Right Upper Extremity Assessment RUE ROM/Strength/Tone: WFL for tasks assessed RUE Sensation: WFL - Light Touch (Pt reports occasional numbness in hands from premorbid statu) RUE Coordination: WFL - gross/fine motor Left Upper Extremity Assessment LUE ROM/Strength/Tone: WFL for tasks assessed LUE Sensation: WFL - Light Touch LUE Coordination: WFL - gross/fine motor Right Lower Extremity Assessment RLE ROM/Strength/Tone: Within functional levels RLE Sensation: WFL - Light Touch Left Lower Extremity Assessment LLE ROM/Strength/Tone: Within functional levels LLE Sensation: WFL - Light Touch (denies numbness or tingling)   Balance    End of Session PT - End of Session Equipment Utilized During Treatment: Gait belt Activity Tolerance: Patient tolerated treatment well Patient left: in bed;with call bell/phone within reach   [facility] 11/08/2011, 12:20 PM

## 2011-11-08 NOTE — Op Note (Signed)
Andrew Norton, PICKRELL NO.:  [id number]  MEDICAL RECORD NO.:  [id number]  LOCATION:  1518                         FACILITY:  [facility]  PHYSICIAN:  Jene Every, M.D.    DATE OF BIRTH:  01/11/1927  DATE OF PROCEDURE: DATE OF DISCHARGE:                              OPERATIVE REPORT   PREOPERATIVE DIAGNOSIS:  Spinal stenosis, L4-L5.  POSTOPERATIVE DIAGNOSIS:  Spinal stenosis, L4-L5.  PROCEDURE PERFORMED: 1. Central decompression L4-L5 with bilateral hemilaminotomies with     lateral recess decompression at L4-l5. 2. Foraminotomies at L4 and L5 bilaterally. 3. Microdiskectomy L4-L5.  ANESTHESIA:  General.  ASSISTANT:  Roma Schanz, P.A.  HISTORY:  An 78 year old with refractory with severe right and left lower extremity radicular pain.  The patient has a persistent pain despite conservative treatment that was indicated for decompression.  He has a history of right leg pain and left leg pain, alternating, and had been refractory to conservative treatment.  He interestingly had MRI, it was somewhat underwhelming in terms of its pathology of lateral recess stenosis noted.  He had an upright myelogram, which indicated bilateral significant defects at L4-L5 bilaterally.  He had severe pain and had been refractory to conservative treatment.  Had bilateral epidural with temporary relief of his symptoms.  Risks and benefits discussed including bleeding, infection, damage to vascular structures.  No change in symptoms, worsening symptoms.  He had some L2-L3 pathology as well as neural foraminal stenosis.  However, his main pain was in the L5 nerve root distribution bilaterally.  It was indicated for decompression centrally and bilaterally at L4-L5.  Risks and benefits discussed including bleeding, infection, damage to vascular structures, change in symptoms, worsening symptoms, need for repeat decompression, DVT, PE, anesthetic complications, etc.  TECHNIQUE:   The patient was positioned in the [place] frame.  All bony prominences well padded.  The cervical spine was in the neutral position.  Again there is history of cervical spondylosis with cervical fusion.  Patient in supine position, after induction of adequate anesthesia, 2 g vancomycin and subsequently 400 of Cipro for gram-negative coverages. He was placed prone on the [place] frame.  All bony prominences well padded.  Lumbar region was prepped and draped in usual sterile fashion. Two 18-gauge spinal needles were utilized to localize L4-L5 interspace, confirmed with x-ray.  Incision was made from spinous process of L4-L5. Subcutaneous tissue was dissected by cautery to achieve hemostasis. Paraspinous muscle elevated from lamina in L4 and L5.  We placed McCullough retractors to the [place] in the interlaminar space at L4- L5.  The cephalad edge of the McCullough retractor is right at the disk space at L4-L5.  We removed the spinous processes of L4.  He had a thoroughly ossified interspinous ligament and small interlaminar window. We then utilized a straight curette to detach the ligamentum flavum from the cephalad edge of L5.  Neuro Patty was placed beneath the ligamentum of flavum and essentially we began the laminotomies.  We then performed hemilaminotomies of the caudad edge of L4 bilaterally and cephalad edge of L5.  He had severe lateral recess stenosis, actually right greater than left intraoperatively.  The attention was  turned towards the right first to protect the neural elements.  We decompressed lateral recess to the medial border of pedicle undercutting the facet on the right. Severe compression of the L5 root was noted here consistent with his L5 radiculopathy bilaterally, performed foraminotomies of L5 and also L4. In a similar fashion, we decompressed the left performing foraminotomies of L5.  We decompressed the lateral recess to the medial border of the pedicle,  hypertrophic ligamentum, which was noted bilaterally and facet hypertrophy was utilized to decompress the lateral recess.  We undercut the facets bilaterally and removed a portion of the lamina bilaterally preserving the pars.  We checked the disk on the left.  There was no herniation on the right.  There was a small protrusion.  I performed an annulotomy, removed very small fragment.  The majority of the disk was degenerated and hardened.  Again noted intraoperatively with severe compression of the L5 root bilaterally, taking the ligamentum flavum, facet hypertrophy.  Performed foraminotomies bilaterally as well.  Then passed a [place] retractor cephalad and had no central lateral recess compression up to the pedicle L4.  The foramen of L5 was widely patent. Following this, I had 1 cm __________ of the L5 root beneath the pedicle without difficulty.  The pathology which was encountered was consistent with that seen on the myelogram.  Bipolar cautery was utilized to achieve hemostasis, copiously irrigated disk space with antibiotic irrigation.  Next, inspection revealed no evidence of CSF leakage or active bleeding.  We used thrombin-soaked Gelfoam to remove laminotomy defect and bipolar cautery utilized to achieve hemostasis.  Removed McCullough retractor.  No evidence of active bleeding, I therefore repaired the dorsal fascia with 1-0 Vicryl interrupted figure-of-8 sutures, subcutaneous with 2-0 Vicryl simple sutures.  Skin was reapproximated with staples and wound was dressed sterilely.  The patient was then placed supine on the hospital bed, extubated without difficulty and transported to the recovery in satisfactory condition.  The patient tolerated procedure well.  No complications.  BLOOD LOSS:  20 mL.  The intraoperative pathology consistent with that seen on his myelogram, which had significant defects in the lateral recesses bilaterally in the upright standing  position.     Jene Every, M.D.     Cordelia Pen  D:  11/07/2011  T:  11/08/2011  Job:  [id number]

## 2011-11-08 NOTE — Progress Notes (Addendum)
Met with Pt and wife re: d/c plans.  Pt and wife feel that Pt would benefit from SNF and are requesting that Pt go to [place], as he and his wife have been there before.  Pt's wife can be reached at [phone].  CSW thanked Pt and his wife for their time.  Providence Crosby, LCSWA Clinical Social Work [phone]

## 2011-11-08 NOTE — Evaluation (Signed)
Occupational Therapy Evaluation Patient Details Name: Andrew Norton MRN: [id number] DOB: Jun 14, 1927 Today's Date: 11/08/2011 Time: 4098-1191 OT Time Calculation (min): 27 min  OT Assessment / Plan / Recommendation Clinical Impression  Pt is an 78 yo male who presents with decomp and microdiskectomy of L4-L5. Recommend HHOT w/24/7 vs SNF depending on progress. Skilled OT recommended to maximize I w/BADLs to supervision level in prep for d/c to next venue of care or for safe d/c home.    OT Assessment  Patient needs continued OT Services    Follow Up Recommendations  Skilled nursing facility;Home health OT;Supervision/Assistance - 24 hour    Equipment Recommendations  3 in 1 bedside comode    Frequency Min 2X/week    Precautions / Restrictions Precautions Precautions: Back Precaution Booklet Issued: Yes (comment)   Pertinent Vitals/Pain     ADL  Grooming: Simulated;Set up Where Assessed - Grooming: Unsupported sitting Upper Body Bathing: Simulated;Set up Where Assessed - Upper Body Bathing: Unsupported;Sitting, bed Lower Body Bathing: Simulated;Maximal assistance Where Assessed - Lower Body Bathing: Sit to stand from bed Upper Body Dressing: Simulated;Set up Where Assessed - Upper Body Dressing: Sitting, bed;Unsupported Lower Body Dressing: Simulated;Maximal assistance Where Assessed - Lower Body Dressing: Sit to stand from bed Toilet Transfer: Performed;Moderate assistance Toilet Transfer Method: Stand pivot Toilet Transfer Equipment: Bedside commode Toileting - Clothing Manipulation: Performed;Moderate assistance Where Assessed - Toileting Clothing Manipulation: Sit to stand from 3-in-1 or toilet Toileting - Hygiene: Performed;Maximal assistance Where Assessed - Toileting Hygiene: Sit to stand from 3-in-1 or toilet Tub/Shower Transfer: Not assessed Tub/Shower Transfer Method: Not assessed Equipment Used: Rolling walker;Other (comment) (3:1) ADL Comments: Pt educated in  all back precautions with regards to ADLs.    OT Goals Acute Rehab OT Goals OT Goal Formulation: With patient Time For Goal Achievement: 11/22/11 Potential to Achieve Goals: Good ADL Goals Pt Will Perform Grooming: with supervision;Standing at sink ADL Goal: Grooming - Progress: Goal set today Pt Will Perform Lower Body Bathing: with min assist;Sit to stand from chair;Sit to stand from bed;with adaptive equipment ADL Goal: Lower Body Bathing - Progress: Goal set today Pt Will Perform Lower Body Dressing: with min assist;Sit to stand from bed;Sit to stand from chair;with adaptive equipment ADL Goal: Lower Body Dressing - Progress: Goal set today Pt Will Transfer to Toilet: with supervision;3-in-1;Stand pivot transfer;Ambulation;Maintaining back safety precautions ADL Goal: Toilet Transfer - Progress: Goal set today Pt Will Perform Toileting - Clothing Manipulation: with supervision;Standing ADL Goal: Toileting - Clothing Manipulation - Progress: Goal set today Pt Will Perform Toileting - Hygiene: with supervision;Sit to stand from 3-in-1/toilet ADL Goal: Toileting - Hygiene - Progress: Goal set today  Visit Information  Last OT Received On: 11/08/11 Assistance Needed: +1 (for basic transfers.)    Subjective Data  Subjective: "It's painful."   Prior Functioning  Home Living Lives With: Spouse Available Help at Discharge: Family Type of Home: House Home Access: Stairs to enter Secretary/administrator of Steps: 4 Entrance Stairs-Rails: Right;Left Home Layout: Two level Alternate Level Stairs-Number of Steps: 13 Alternate Level Stairs-Rails: Left;Right;Can reach both Bathroom Shower/Tub: Health visitor: Standard Home Adaptive Equipment: Shower chair without back;Grab bars in shower;Straight cane;Walker - rolling Prior Function Level of Independence: Independent Driving: Yes Vocation: Retired Musician: No difficulties    Cognition  Overall  Cognitive Status: Appears within functional limits for tasks assessed/performed Arousal/Alertness: Awake/alert Orientation Level: Appears intact for tasks assessed Behavior During Session: [facility] for tasks performed    Extremity/Trunk Assessment Right Upper  Extremity Assessment RUE ROM/Strength/Tone: WFL for tasks assessed RUE Sensation: WFL - Light Touch (Pt reports occasional numbness in hands from premorbid statu) RUE Coordination: WFL - gross/fine motor Left Upper Extremity Assessment LUE ROM/Strength/Tone: WFL for tasks assessed LUE Sensation: WFL - Light Touch LUE Coordination: WFL - gross/fine motor   Mobility Bed Mobility Bed Mobility: Rolling Left;Left Sidelying to Sit Rolling Left: 4: Min assist Left Sidelying to Sit: 3: Mod assist Details for Bed Mobility Assistance: Educated pt in log roll. cues for sequencing, hand placement. Pt required physical A for trunk. Transfers Transfers: Sit to Stand;Stand to Sit Sit to Stand: 3: Mod assist;With upper extremity assist;From elevated surface;From bed;From chair/3-in-1;With armrests Stand to Sit: With upper extremity assist;3: Mod assist;With armrests;To chair/3-in-1 Details for Transfer Assistance: cues for technique and hand placement.   Exercise    Balance    End of Session OT - End of Session Activity Tolerance: Patient tolerated treatment well Patient left: in chair;with call bell/phone within reach;with family/visitor present   Cameran Ahmed A OTR/L [phone] 11/08/2011, 9:24 AM

## 2011-11-08 NOTE — Progress Notes (Signed)
Subjective: 1 Day Post-Op Procedure(s) (LRB): LUMBAR LAMINECTOMY/DECOMPRESSION MICRODISCECTOMY (N/A) Patient reports pain as moderate in his back Denies CP or SOB.  Foley was removed earlier this am. Positive flatus. Objective: Vital signs in last 24 hours: Temp:  [97.4 F (36.3 C)-98 F (36.7 C)] 98 F (36.7 C) (04/25 1010) Pulse Rate:  [52-75] 75  (04/25 1010) Resp:  [12-20] 20  (04/25 1010) BP: (97-178)/(55-90) 132/55 mmHg (04/25 1010) SpO2:  [87 %-100 %] 99 % (04/25 1010) Weight:  [79.379 kg (175 lb)] 79.379 kg (175 lb) (04/24 1544)  Intake/Output from previous day: 04/24 0701 - 04/25 0700 In: 700 [I.V.:700] Out: 2025 [Urine:1875; Blood:150] Intake/Output this shift:    No results found for this basename: HGB:5 in the last 72 hours No results found for this basename: WBC:2,RBC:2,HCT:2,PLT:2 in the last 72 hours  Basename 11/07/11 0915  NA --  K 4.6  CL --  CO2 --  BUN --  CREATININE --  GLUCOSE --  CALCIUM --   No results found for this basename: LABPT:2,INR:2 in the last 72 hours  Neurologically intact Neurovascular intact Sensation intact distally Dorsiflexion/Plantar flexion intact Compartment soft  Assessment/Plan: 1 Day Post-Op Procedure(s) (LRB): LUMBAR LAMINECTOMY/DECOMPRESSION MICRODISCECTOMY (N/A) Advance diet Up with therapy Plan for discharge tomorrow  Jenifer Struve R. 11/08/2011, 10:19 AM

## 2011-11-08 NOTE — Progress Notes (Signed)
UR completed 

## 2011-11-09 LAB — GLUCOSE, CAPILLARY: Glucose-Capillary: 192 mg/dL — ABNORMAL HIGH (ref 70–99)

## 2011-11-09 MED ORDER — POLYETHYLENE GLYCOL 3350 17 G PO PACK: 17.0000 g | PACK | Freq: Every day | ORAL | Status: AC | PRN

## 2011-11-09 MED ORDER — HYDROCODONE-ACETAMINOPHEN 5-325 MG PO TABS: 1.0000 | ORAL_TABLET | ORAL | Status: AC | PRN

## 2011-11-09 NOTE — Progress Notes (Signed)
   CARE MANAGEMENT NOTE 11/09/2011  Patient:  Andrew, Norton   Account Number:  [id number]  Date Initiated:  11/09/2011  Documentation initiated by:  Andrew Norton  Subjective/Objective Assessment:   PT ADMIT LUMBAR LAMINECTOMY/DECOMPRESSION MICRODISCECTOMY on 11/07/2011     Action/Plan:   LIVES WITH WIFE--- NOT ELIGIBLE FOR SNF--NOT ELIGIBLE FOR CIR-- DCP-- HHC TODAY WITH [facility]   Anticipated DC Date:  11/09/2011   Anticipated DC Plan:  HOME W HOME HEALTH SERVICES  In-house referral  Clinical Social Worker      DC Associate Professor  CM consult      PAC Choice  DURABLE MEDICAL EQUIPMENT  HOME HEALTH   Choice offered to / List presented to:  C-3 Spouse   DME arranged  HOSPITAL BED      DME agency  [facility]     HH arranged  HH-1 RN  HH-2 PT  HH-3 OT  HH-4 NURSE'S AIDE      HH agency  [facility]   Status of service:  In process, will continue to follow Medicare Important Message given?   (If response is "NO", the following Medicare IM given date fields will be blank) Date Medicare IM given:   Date Additional Medicare IM given:    Discharge Disposition:  HOME W HOME HEALTH SERVICES  Per UR Regulation:  Reviewed for med. necessity/level of care/duration of stay  If discussed at Long Length of Stay Meetings, dates discussed:    Comments:  LATE ENTRY FOR 11-08-11 Andrew Noble, RN,BSN,CM [phone] SPOKE WITH PATIENT AND WIFE AT BEDSIDE TO DISCUSS DC PLANS. Andrew Norton PREFERS THAT PATIENT GOES TO SNF RATHER THAN HOME. STATES PT HAS 13 STEPS TO GET UPSTAIRS. EXPLAINED IN DETAIL TO PATIENT AND WIFE TO DISCUSS WHY Andrew Norton DOES NOT QUALIFY FOR SNF B/C HE IS NOT INPATIENT STATUS. THEY WILL HAVE TO PAY OUT OF POCKET IF THEY DECIDE TO GO TO SNF. THIS CM ALSO CALLED CIR TO SEE IF PATIENT WAS ELIGIBLE FOR CIR. SHERRI WITH CIR STATES PT IS TOO HIGH FUNCTIONING. MADE PT AND WIFE AWARE OF THIS AND DISCUSSED OPTIONS FOR HHC-- Andrew Norton STATES THEY WILL NEED HOSPITAL  BED. SHE ALSO CHOSE [facility] FOR HHC AND DME SERVICES. MADE LECRETIA AWARE WITH [facility] FOR HOSPITAL.

## 2011-11-09 NOTE — Progress Notes (Signed)
Physical Therapy Treatment Patient Details Name: Andrew Norton MRN: [id number] DOB: [birth year] Today's Date: 11/09/2011 Time: 4098-1191 PT Time Calculation (min): 19 min  PT Assessment / Plan / Recommendation Comments on Treatment Session  pt now will DC to home. wiil need to practice steps, to get Hospital bed for home.    Follow Up Recommendations  Home health PT    Equipment Recommendations       Frequency Min 6X/week   Plan Discharge plan remains appropriate    Precautions / Restrictions Precautions Precautions: Back   Pertinent Vitals/Pain Back 8 but decreased to 5    Mobility  Bed Mobility Bed Mobility: Rolling Left;Left Sidelying to Sit Rolling Left: 5: Supervision Left Sidelying to Sit: 5: Supervision Details for Bed Mobility Assistance: takes extra time due to precautions and pain. Transfers Transfers: Sit to Stand;Stand to Sit Sit to Stand: 4: Min assist;From bed;With upper extremity assist Stand to Sit: 5: Supervision;To chair/3-in-1;With upper extremity assist Details for Transfer Assistance: pt moves very slowly, appears "stiff" in back Ambulation/Gait Ambulation/Gait Assistance: 4: Min assist Ambulation Distance (Feet): 100 Feet Assistive device: Rolling walker Ambulation/Gait Assistance Details: pt asked to ambulate farther, statesa that pain decreases w/ walking Gait Pattern: Step-through pattern Gait velocity: slower than normal    Exercises     PT Goals Acute Rehab PT Goals PT Goal Formulation: With patient Time For Goal Achievement: 11/13/11 Potential to Achieve Goals: Good Pt will Roll Supine to Left Side: with supervision PT Goal: Rolling Supine to Left Side - Progress: Progressing toward goal Pt will go Supine/Side to Sit: with supervision Pt will go Sit to Stand: with supervision PT Goal: Sit to Stand - Progress: Progressing toward goal Pt will go Stand to Sit: with supervision PT Goal: Stand to Sit - Progress: Met Pt will Ambulate: 51 -  150 feet;with supervision;with rolling walker PT Goal: Ambulate - Progress: Progressing toward goal Pt will Go Up / Down Stairs: 3-5 stairs;with least restrictive assistive device;with rail(s)  Visit Information  Last PT Received On: 11/09/11 Assistance Needed: +1    Subjective Data  Subjective: i will not get to go to rehab   Cognition  Overall Cognitive Status: Appears within functional limits for tasks assessed/performed Arousal/Alertness: Awake/alert Orientation Level: Appears intact for tasks assessed Behavior During Session: [facility] for tasks performed    Balance     End of Session PT - End of Session Activity Tolerance: Patient tolerated treatment well Patient left: in chair;with call bell/phone within reach    Rada Hay 11/09/2011, 2:22 PM [phone]

## 2011-11-09 NOTE — Progress Notes (Signed)
11-09-11 Wife states they have 13 steps to go upstairs to the bedroom. [name] reports husband will need hospital bed in the living room. Wife states they have had hospital bed recently and will need it back since they are not going to snf b/c they will not pay out of pocket for SNF.  [place], [place]  [phone]

## 2011-11-09 NOTE — Progress Notes (Signed)
11-09-11 Spoke with patient and wife via cell phone at [phone] to confirm dc plans with going home w/ [facility]. Confirmed with Lecretia with [facility] regarding hospital bed. Confirmed with Norberta Keens as well for [facility] services PT/OT/RN/HHA services. Pt and wife deny need for other dme other than hosptial bed. Already has 3in1 and walker at thome. No further needs assessed.   [place], [place]  [phone]

## 2011-11-09 NOTE — Progress Notes (Signed)
Physical Therapy Treatment Patient Details Name: Andrew Norton MRN: [id number] DOB: 1926-10-18 Today's Date: 11/09/2011 Time: 4098-1191 PT Time Calculation (min): 11 min  PT Assessment / Plan / Recommendation Comments on Treatment Session  pt now will DC to home. wiil need to practice steps, to get Hospital bed for home.    Follow Up Recommendations  Home health PT    Equipment Recommendations       Frequency Min 6X/week   Plan Discharge plan remains appropriate    Precautions / Restrictions Precautions Precautions: Back   Pertinent Vitals/Pain 5    Mobility  Transfers Transfers: Sit to Stand;Stand to Sit Sit to Stand: 5: Supervision;From chair/3-in-1;With upper extremity assist Stand to Sit: 5: Supervision;To chair/3-in-1;With upper extremity assist Details for Transfer Assistance: takes extra time due to stiffness Ambulation/Gait Ambulation/Gait Assistance: 4: Min guard Ambulation Distance (Feet): 50 Feet Assistive device: Rolling walker Ambulation/Gait Assistance Details: pt asked to ambulate farther, statesa that pain decreases w/ walking Gait Pattern: Step-through pattern Gait velocity: slower than normal Stairs: Yes Stairs Assistance: 4: Min assist Stair Management Technique: One rail Right;One rail Left;Sideways Number of Stairs: 5   vc for technique and safety  Exercises     PT Goals Acute Rehab PT Goals PT Goal Formulation: With patient Time For Goal Achievement: 11/13/11 Potential to Achieve Goals: Good Pt will Roll Supine to Left Side: with supervision PT Goal: Rolling Supine to Left Side - Progress: Progressing toward goal Pt will go Supine/Side to Sit: with supervision Pt will go Sit to Stand: with supervision PT Goal: Sit to Stand - Progress: Progressing toward goal Pt will go Stand to Sit: with supervision PT Goal: Stand to Sit - Progress: Met Pt will Ambulate: 51 - 150 feet;with supervision;with rolling walker PT Goal: Ambulate - Progress:  Progressing toward goal Pt will Go Up / Down Stairs: 3-5 stairs;with least restrictive assistive device;with rail(s) MET  Visit Information  Last PT Received On: 11/09/11 Assistance Needed: +1    Subjective Data  Subjective: i will try the steps   Cognition  Overall Cognitive Status: Appears within functional limits for tasks assessed/performed Arousal/Alertness: Awake/alert Orientation Level: Appears intact for tasks assessed Behavior During Session: [facility] for tasks performed    Balance     End of Session PT - End of Session Activity Tolerance: Patient tolerated treatment well Patient left: in chair;with call bell/phone within reach    Rada Hay 11/09/2011, 2:28 PM

## 2011-11-09 NOTE — Discharge Summary (Signed)
Patient ID: Andrew Norton MRN: [id number] DOB/AGE: [birth year] 78 y.o.  Admit date: 11/07/2011 Discharge date: 11/09/2011  Admission Diagnoses:  Spinal stenosis Past Medical History  Diagnosis Date  . Anemia   . Diabetes mellitus   . Hyperlipidemia   . Renal insufficiency   . Paget's disease     scrotum  . Positive PPD, treated   . Hypertension     EKG, chest x ray 11/15/10 [facility],  clearance Dr Abran Cantor on chart/ pt states on meds to "prevent hypertension from diabetes"  . Carpal tunnel syndrome     left leg sciatica  . Arthritis   . Easy bruising   . Stroke     SPINAL CORD STROKE PER OV note 3/13  DR LOVE CLEARANCE NOTE_  ON CHART WITH RECOMMENDATIONS-   has weakness  upper extremities   Discharge Diagnoses:  Same   Surgeries: Procedure(s): LUMBAR LAMINECTOMY/DECOMPRESSION MICRODISCECTOMY on 11/07/2011   Consultants:    Discharged Condition: Improved  Hospital Course: Andrew Norton is an 78 y.o. male who was admitted 11/07/2011 for operative treatment of spinal stenosis. Patient has severe unremitting pain that affects sleep, daily activities, and work/hobbies. After pre-op clearance the patient was taken to the operating room on 11/07/2011 and underwent  Procedure(s): LUMBAR LAMINECTOMY/DECOMPRESSION MICRODISCECTOMY.    Patient was given perioperative antibiotics: Anti-infectives     Start     Dose/Rate Route Frequency Ordered Stop   11/08/11 0000   vancomycin (VANCOCIN) 750 mg in sodium chloride 0.9 % 150 mL IVPB        750 mg 150 mL/hr over 60 Minutes Intravenous  Once 11/07/11 1800 11/08/11 0139   11/07/11 1800   ciprofloxacin (CIPRO) IVPB 400 mg     Comments: Number of doses reduced to two post-operatively per C Sourish Allender.      400 mg 200 mL/hr over 60 Minutes Intravenous Every 12 hours 11/07/11 1623 11/08/11 0615   11/07/11 1202   polymyxin B 500,000 Units, bacitracin 50,000 Units in sodium chloride irrigation 0.9 % 500 mL irrigation  Status:  Discontinued          As needed  11/07/11 1202 11/07/11 1321   11/07/11 0825   vancomycin (VANCOCIN) IVPB 1000 mg/200 mL premix        1,000 mg 200 mL/hr over 60 Minutes Intravenous 60 min pre-op 11/07/11 0825 11/07/11 1122           Patient was given sequential compression devices, early ambulation, and chemoprophylaxis to prevent DVT.  Patient benefited maximally from hospital stay and there were no complications.    Recent vital signs: Patient Vitals for the past 24 hrs:  BP Temp Temp src Pulse Resp SpO2  11/09/11 0543 163/65 mmHg 98 F (36.7 C) Oral 78  18  93 %  11/09/11 0200 130/61 mmHg 98.4 F (36.9 C) Oral 78  18  92 %  11/08/11 2200 154/66 mmHg 98.1 F (36.7 C) Oral 73  18  95 %  11/08/11 1812 128/60 mmHg 98 F (36.7 C) Oral 70  20  97 %  11/08/11 1402 137/54 mmHg 97.9 F (36.6 C) Oral 78  18  98 %  11/08/11 1010 132/55 mmHg 98 F (36.7 C) Oral 75  20  99 %     Recent laboratory studies:  Basename 11/07/11 0915  WBC --  HGB --  HCT --  PLT --  NA --  K 4.6  CL --  CO2 --  BUN --  CREATININE --  GLUCOSE --  INR --  CALCIUM --     Discharge Medications:   Medication List  As of 11/09/2011  8:59 AM   STOP taking these medications         oxyCODONE-acetaminophen 10-325 MG per tablet         TAKE these medications         acetaminophen 500 MG tablet   Commonly known as: TYLENOL   Take 500 mg by mouth every 6 (six) hours as needed. For pain      aspirin 81 MG chewable tablet   Chew 81 mg by mouth daily.      cyanocobalamin 1000 MCG/ML injection   Commonly known as: (VITAMIN B-12)   Inject 1,000 mcg into the muscle every 30 (thirty) days.      cyclobenzaprine 10 MG tablet   Commonly known as: FLEXERIL   Take 10 mg by mouth 3 (three) times daily as needed. For muscle spasms      ferrous sulfate 220 (44 FE) MG/5ML solution   Take 220 mg by mouth at bedtime.      gabapentin 100 MG capsule   Commonly known as: NEURONTIN   Take 200-300 mg by mouth See admin instructions.  Takes 3 capsules three times daily then takes 2 capsules at bedtime.      glipiZIDE 10 MG tablet   Commonly known as: GLUCOTROL   Take 10 mg by mouth every morning.      HYDROcodone-acetaminophen 5-325 MG per tablet   Commonly known as: NORCO   Take 1-2 tablets by mouth every 4 (four) hours as needed.      lidocaine 5 %   Commonly known as: LIDODERM   Place 1 patch onto the skin daily. Applies to lower back      losartan 50 MG tablet   Commonly known as: COZAAR   Take 50 mg by mouth every morning.      metFORMIN 500 MG tablet   Commonly known as: GLUCOPHAGE   Take 500 mg by mouth 2 (two) times daily.      polyethylene glycol powder powder   Commonly known as: GLYCOLAX/MIRALAX   Take 17 g by mouth daily.      polyethylene glycol packet   Commonly known as: MIRALAX / GLYCOLAX   Take 17 g by mouth daily as needed.      simvastatin 40 MG tablet   Commonly known as: ZOCOR   Take 40 mg by mouth at bedtime.            Diagnostic Studies: Dg Lumbar Spine 2-3 Views  11/02/2011  *RADIOLOGY REPORT*  Clinical Data: Preoperative for lumbar surgery.  LUMBAR SPINE - 2-3 VIEW  Comparison: 07/06/2011  Findings: Left hip arthroplasty is partially included.  Lumbar spondylosis is again noted with prominent multilevel spurring, and end plate compressions at L1 and L2.  As on the prior exam, the lowest full intervertebral disk space is labeled L5-S1.  Lower lumbar facet arthropathy noted.  IMPRESSION:  1.  Lower thoracic and lumbar spondylosis. 2.  Old endplate compressions at L1-L2.  Original Report Authenticated By: Dellia Cloud, M.D.   Dg Spine Portable 1 View  11/07/2011  *RADIOLOGY REPORT*  Clinical Data: Intraoperative localization.  PORTABLE SPINE - 1 VIEW  Comparison: Same day  Findings: Tissue spreaders are in place posteriorly.  There is a clamp posterior to the pedicle level of L5.  IMPRESSION: Clamp at the pedicle level of L5.  Original Report Authenticated By: Janeece Riggers  Karin Golden, M.D.   Dg Spine Portable 1 View  11/07/2011  *RADIOLOGY REPORT*  Clinical Data: Localization films for spinal surgery.  PORTABLE SPINE - 1 VIEW  Comparison: 11/02/2011  Findings: Lumbar vertebral bodies are labeled and correlation with the previous study.  A needle is in place at the level of the spinous process of L4.  IMPRESSION: Spinous process L4 localized.  Original Report Authenticated By: Thomasenia Sales, M.D.    Disposition:  Home with home health, insurance would not cover SNF  Discharge Orders    Future Orders Please Complete By Expires   Diet - low sodium heart healthy      Call MD / Call 911      Comments:   If you experience chest pain or shortness of breath, CALL 911 and be transported to the hospital emergency room.  If you develope a fever above 101 F, pus (white drainage) or increased drainage or redness at the wound, or calf pain, call your surgeon's office.   Constipation Prevention      Comments:   Drink plenty of fluids.  Prune juice may be helpful.  You may use a stool softener, such as Colace (over the counter) 100 mg twice a day.  Use MiraLax (over the counter) for constipation as needed.   Increase activity slowly as tolerated      Weight Bearing as taught in Physical Therapy      Comments:   Use a walker or crutches as instructed.   Discharge instructions      Comments:   Walk As Tolerated utilizing back precautions.  No bending, twisting, or lifting.  No driving for 2 weeks.  Ok to shower in 72 hours.  Use good body mechanics. Change dressing daily.      Follow-up Information    Follow up with BEANE,JEFFREY C, MD in 14 days.   Contact information:   [facility] [address] [place] [address] [phone]           Signed: Liam Graham. 11/09/2011, 8:59 AM

## 2011-11-11 DIAGNOSIS — M545 Low back pain: Secondary | ICD-10-CM | POA: Diagnosis not present

## 2011-11-11 DIAGNOSIS — R269 Unspecified abnormalities of gait and mobility: Secondary | ICD-10-CM | POA: Diagnosis not present

## 2011-11-11 DIAGNOSIS — Z4789 Encounter for other orthopedic aftercare: Secondary | ICD-10-CM | POA: Diagnosis not present

## 2011-11-11 DIAGNOSIS — I10 Essential (primary) hypertension: Secondary | ICD-10-CM | POA: Diagnosis not present

## 2011-11-11 DIAGNOSIS — E119 Type 2 diabetes mellitus without complications: Secondary | ICD-10-CM | POA: Diagnosis not present

## 2011-11-11 DIAGNOSIS — D649 Anemia, unspecified: Secondary | ICD-10-CM | POA: Diagnosis not present

## 2011-11-12 ENCOUNTER — Encounter (HOSPITAL_COMMUNITY): Payer: Self-pay | Admitting: Specialist

## 2011-11-12 DIAGNOSIS — E119 Type 2 diabetes mellitus without complications: Secondary | ICD-10-CM | POA: Diagnosis not present

## 2011-11-12 DIAGNOSIS — D649 Anemia, unspecified: Secondary | ICD-10-CM | POA: Diagnosis not present

## 2011-11-12 DIAGNOSIS — M545 Low back pain: Secondary | ICD-10-CM | POA: Diagnosis not present

## 2011-11-12 DIAGNOSIS — R269 Unspecified abnormalities of gait and mobility: Secondary | ICD-10-CM | POA: Diagnosis not present

## 2011-11-12 DIAGNOSIS — Z4789 Encounter for other orthopedic aftercare: Secondary | ICD-10-CM | POA: Diagnosis not present

## 2011-11-12 DIAGNOSIS — I10 Essential (primary) hypertension: Secondary | ICD-10-CM | POA: Diagnosis not present

## 2011-11-13 DIAGNOSIS — R269 Unspecified abnormalities of gait and mobility: Secondary | ICD-10-CM | POA: Diagnosis not present

## 2011-11-13 DIAGNOSIS — M545 Low back pain: Secondary | ICD-10-CM | POA: Diagnosis not present

## 2011-11-13 DIAGNOSIS — D649 Anemia, unspecified: Secondary | ICD-10-CM | POA: Diagnosis not present

## 2011-11-13 DIAGNOSIS — I10 Essential (primary) hypertension: Secondary | ICD-10-CM | POA: Diagnosis not present

## 2011-11-13 DIAGNOSIS — E119 Type 2 diabetes mellitus without complications: Secondary | ICD-10-CM | POA: Diagnosis not present

## 2011-11-13 DIAGNOSIS — Z4789 Encounter for other orthopedic aftercare: Secondary | ICD-10-CM | POA: Diagnosis not present

## 2011-11-14 DIAGNOSIS — Z4789 Encounter for other orthopedic aftercare: Secondary | ICD-10-CM | POA: Diagnosis not present

## 2011-11-14 DIAGNOSIS — R269 Unspecified abnormalities of gait and mobility: Secondary | ICD-10-CM | POA: Diagnosis not present

## 2011-11-14 DIAGNOSIS — D649 Anemia, unspecified: Secondary | ICD-10-CM | POA: Diagnosis not present

## 2011-11-14 DIAGNOSIS — E119 Type 2 diabetes mellitus without complications: Secondary | ICD-10-CM | POA: Diagnosis not present

## 2011-11-14 DIAGNOSIS — M545 Low back pain: Secondary | ICD-10-CM | POA: Diagnosis not present

## 2011-11-14 DIAGNOSIS — I10 Essential (primary) hypertension: Secondary | ICD-10-CM | POA: Diagnosis not present

## 2011-11-15 DIAGNOSIS — E119 Type 2 diabetes mellitus without complications: Secondary | ICD-10-CM | POA: Diagnosis not present

## 2011-11-15 DIAGNOSIS — R269 Unspecified abnormalities of gait and mobility: Secondary | ICD-10-CM | POA: Diagnosis not present

## 2011-11-15 DIAGNOSIS — I10 Essential (primary) hypertension: Secondary | ICD-10-CM | POA: Diagnosis not present

## 2011-11-15 DIAGNOSIS — D649 Anemia, unspecified: Secondary | ICD-10-CM | POA: Diagnosis not present

## 2011-11-15 DIAGNOSIS — M545 Low back pain: Secondary | ICD-10-CM | POA: Diagnosis not present

## 2011-11-15 DIAGNOSIS — Z4789 Encounter for other orthopedic aftercare: Secondary | ICD-10-CM | POA: Diagnosis not present

## 2011-11-16 DIAGNOSIS — E119 Type 2 diabetes mellitus without complications: Secondary | ICD-10-CM | POA: Diagnosis not present

## 2011-11-16 DIAGNOSIS — Z4789 Encounter for other orthopedic aftercare: Secondary | ICD-10-CM | POA: Diagnosis not present

## 2011-11-16 DIAGNOSIS — I10 Essential (primary) hypertension: Secondary | ICD-10-CM | POA: Diagnosis not present

## 2011-11-16 DIAGNOSIS — R269 Unspecified abnormalities of gait and mobility: Secondary | ICD-10-CM | POA: Diagnosis not present

## 2011-11-16 DIAGNOSIS — M545 Low back pain: Secondary | ICD-10-CM | POA: Diagnosis not present

## 2011-11-16 DIAGNOSIS — D649 Anemia, unspecified: Secondary | ICD-10-CM | POA: Diagnosis not present

## 2011-11-19 DIAGNOSIS — Z4789 Encounter for other orthopedic aftercare: Secondary | ICD-10-CM | POA: Diagnosis not present

## 2011-11-19 DIAGNOSIS — M545 Low back pain: Secondary | ICD-10-CM | POA: Diagnosis not present

## 2011-11-19 DIAGNOSIS — I10 Essential (primary) hypertension: Secondary | ICD-10-CM | POA: Diagnosis not present

## 2011-11-19 DIAGNOSIS — R269 Unspecified abnormalities of gait and mobility: Secondary | ICD-10-CM | POA: Diagnosis not present

## 2011-11-19 DIAGNOSIS — E119 Type 2 diabetes mellitus without complications: Secondary | ICD-10-CM | POA: Diagnosis not present

## 2011-11-19 DIAGNOSIS — D649 Anemia, unspecified: Secondary | ICD-10-CM | POA: Diagnosis not present

## 2011-11-20 DIAGNOSIS — Z4789 Encounter for other orthopedic aftercare: Secondary | ICD-10-CM | POA: Diagnosis not present

## 2011-11-20 DIAGNOSIS — D649 Anemia, unspecified: Secondary | ICD-10-CM | POA: Diagnosis not present

## 2011-11-20 DIAGNOSIS — M545 Low back pain: Secondary | ICD-10-CM | POA: Diagnosis not present

## 2011-11-20 DIAGNOSIS — I10 Essential (primary) hypertension: Secondary | ICD-10-CM | POA: Diagnosis not present

## 2011-11-20 DIAGNOSIS — E119 Type 2 diabetes mellitus without complications: Secondary | ICD-10-CM | POA: Diagnosis not present

## 2011-11-20 DIAGNOSIS — R269 Unspecified abnormalities of gait and mobility: Secondary | ICD-10-CM | POA: Diagnosis not present

## 2011-11-22 DIAGNOSIS — M545 Low back pain: Secondary | ICD-10-CM | POA: Diagnosis not present

## 2011-11-22 DIAGNOSIS — D649 Anemia, unspecified: Secondary | ICD-10-CM | POA: Diagnosis not present

## 2011-11-22 DIAGNOSIS — Z4789 Encounter for other orthopedic aftercare: Secondary | ICD-10-CM | POA: Diagnosis not present

## 2011-11-22 DIAGNOSIS — R269 Unspecified abnormalities of gait and mobility: Secondary | ICD-10-CM | POA: Diagnosis not present

## 2011-11-22 DIAGNOSIS — E119 Type 2 diabetes mellitus without complications: Secondary | ICD-10-CM | POA: Diagnosis not present

## 2011-11-22 DIAGNOSIS — I10 Essential (primary) hypertension: Secondary | ICD-10-CM | POA: Diagnosis not present

## 2011-11-23 ENCOUNTER — Ambulatory Visit (INDEPENDENT_AMBULATORY_CARE_PROVIDER_SITE_OTHER): Payer: Medicare Other | Admitting: Family Medicine

## 2011-11-23 DIAGNOSIS — M545 Low back pain: Secondary | ICD-10-CM | POA: Diagnosis not present

## 2011-11-23 DIAGNOSIS — I10 Essential (primary) hypertension: Secondary | ICD-10-CM | POA: Diagnosis not present

## 2011-11-23 DIAGNOSIS — D649 Anemia, unspecified: Secondary | ICD-10-CM | POA: Diagnosis not present

## 2011-11-23 DIAGNOSIS — E539 Vitamin B deficiency, unspecified: Secondary | ICD-10-CM

## 2011-11-23 DIAGNOSIS — R269 Unspecified abnormalities of gait and mobility: Secondary | ICD-10-CM | POA: Diagnosis not present

## 2011-11-23 DIAGNOSIS — E119 Type 2 diabetes mellitus without complications: Secondary | ICD-10-CM | POA: Diagnosis not present

## 2011-11-23 DIAGNOSIS — Z4789 Encounter for other orthopedic aftercare: Secondary | ICD-10-CM | POA: Diagnosis not present

## 2011-11-23 MED ORDER — CYANOCOBALAMIN 1000 MCG/ML IJ SOLN
1000.0000 ug | Freq: Once | INTRAMUSCULAR | Status: AC
  Administered 2011-11-23: 1000 ug via INTRAMUSCULAR

## 2011-11-27 DIAGNOSIS — I10 Essential (primary) hypertension: Secondary | ICD-10-CM | POA: Diagnosis not present

## 2011-11-27 DIAGNOSIS — R269 Unspecified abnormalities of gait and mobility: Secondary | ICD-10-CM | POA: Diagnosis not present

## 2011-11-27 DIAGNOSIS — Z4789 Encounter for other orthopedic aftercare: Secondary | ICD-10-CM | POA: Diagnosis not present

## 2011-11-27 DIAGNOSIS — D649 Anemia, unspecified: Secondary | ICD-10-CM | POA: Diagnosis not present

## 2011-11-27 DIAGNOSIS — M545 Low back pain: Secondary | ICD-10-CM | POA: Diagnosis not present

## 2011-11-27 DIAGNOSIS — E119 Type 2 diabetes mellitus without complications: Secondary | ICD-10-CM | POA: Diagnosis not present

## 2011-11-28 DIAGNOSIS — I10 Essential (primary) hypertension: Secondary | ICD-10-CM | POA: Diagnosis not present

## 2011-11-28 DIAGNOSIS — D649 Anemia, unspecified: Secondary | ICD-10-CM | POA: Diagnosis not present

## 2011-11-28 DIAGNOSIS — M545 Low back pain: Secondary | ICD-10-CM | POA: Diagnosis not present

## 2011-11-28 DIAGNOSIS — R269 Unspecified abnormalities of gait and mobility: Secondary | ICD-10-CM | POA: Diagnosis not present

## 2011-11-28 DIAGNOSIS — E119 Type 2 diabetes mellitus without complications: Secondary | ICD-10-CM | POA: Diagnosis not present

## 2011-11-28 DIAGNOSIS — Z4789 Encounter for other orthopedic aftercare: Secondary | ICD-10-CM | POA: Diagnosis not present

## 2011-11-29 DIAGNOSIS — D649 Anemia, unspecified: Secondary | ICD-10-CM | POA: Diagnosis not present

## 2011-11-29 DIAGNOSIS — R269 Unspecified abnormalities of gait and mobility: Secondary | ICD-10-CM | POA: Diagnosis not present

## 2011-11-29 DIAGNOSIS — Z4789 Encounter for other orthopedic aftercare: Secondary | ICD-10-CM | POA: Diagnosis not present

## 2011-11-29 DIAGNOSIS — I10 Essential (primary) hypertension: Secondary | ICD-10-CM | POA: Diagnosis not present

## 2011-11-29 DIAGNOSIS — E119 Type 2 diabetes mellitus without complications: Secondary | ICD-10-CM | POA: Diagnosis not present

## 2011-11-29 DIAGNOSIS — M545 Low back pain: Secondary | ICD-10-CM | POA: Diagnosis not present

## 2011-11-30 DIAGNOSIS — D649 Anemia, unspecified: Secondary | ICD-10-CM | POA: Diagnosis not present

## 2011-11-30 DIAGNOSIS — Z4789 Encounter for other orthopedic aftercare: Secondary | ICD-10-CM | POA: Diagnosis not present

## 2011-11-30 DIAGNOSIS — R269 Unspecified abnormalities of gait and mobility: Secondary | ICD-10-CM | POA: Diagnosis not present

## 2011-11-30 DIAGNOSIS — E119 Type 2 diabetes mellitus without complications: Secondary | ICD-10-CM | POA: Diagnosis not present

## 2011-11-30 DIAGNOSIS — I10 Essential (primary) hypertension: Secondary | ICD-10-CM | POA: Diagnosis not present

## 2011-11-30 DIAGNOSIS — M545 Low back pain: Secondary | ICD-10-CM | POA: Diagnosis not present

## 2011-12-04 DIAGNOSIS — M48061 Spinal stenosis, lumbar region without neurogenic claudication: Secondary | ICD-10-CM | POA: Diagnosis not present

## 2011-12-12 DIAGNOSIS — M48061 Spinal stenosis, lumbar region without neurogenic claudication: Secondary | ICD-10-CM | POA: Diagnosis not present

## 2011-12-14 DIAGNOSIS — M48061 Spinal stenosis, lumbar region without neurogenic claudication: Secondary | ICD-10-CM | POA: Diagnosis not present

## 2011-12-18 DIAGNOSIS — M48061 Spinal stenosis, lumbar region without neurogenic claudication: Secondary | ICD-10-CM | POA: Diagnosis not present

## 2011-12-21 DIAGNOSIS — M48061 Spinal stenosis, lumbar region without neurogenic claudication: Secondary | ICD-10-CM | POA: Diagnosis not present

## 2011-12-25 DIAGNOSIS — M48061 Spinal stenosis, lumbar region without neurogenic claudication: Secondary | ICD-10-CM | POA: Diagnosis not present

## 2011-12-26 ENCOUNTER — Ambulatory Visit (INDEPENDENT_AMBULATORY_CARE_PROVIDER_SITE_OTHER): Payer: Medicare Other | Admitting: Family Medicine

## 2011-12-26 DIAGNOSIS — E539 Vitamin B deficiency, unspecified: Secondary | ICD-10-CM

## 2011-12-26 DIAGNOSIS — L570 Actinic keratosis: Secondary | ICD-10-CM | POA: Diagnosis not present

## 2011-12-26 DIAGNOSIS — Z85820 Personal history of malignant melanoma of skin: Secondary | ICD-10-CM | POA: Diagnosis not present

## 2011-12-26 DIAGNOSIS — D235 Other benign neoplasm of skin of trunk: Secondary | ICD-10-CM | POA: Diagnosis not present

## 2011-12-26 MED ORDER — CYANOCOBALAMIN 1000 MCG/ML IJ SOLN
1000.0000 ug | Freq: Once | INTRAMUSCULAR | Status: AC
  Administered 2011-12-26: 1000 ug via INTRAMUSCULAR

## 2011-12-27 DIAGNOSIS — M48061 Spinal stenosis, lumbar region without neurogenic claudication: Secondary | ICD-10-CM | POA: Diagnosis not present

## 2012-01-01 DIAGNOSIS — M48061 Spinal stenosis, lumbar region without neurogenic claudication: Secondary | ICD-10-CM | POA: Diagnosis not present

## 2012-01-09 DIAGNOSIS — M48061 Spinal stenosis, lumbar region without neurogenic claudication: Secondary | ICD-10-CM | POA: Diagnosis not present

## 2012-01-22 ENCOUNTER — Ambulatory Visit (INDEPENDENT_AMBULATORY_CARE_PROVIDER_SITE_OTHER): Payer: Medicare Other | Admitting: Family Medicine

## 2012-01-22 DIAGNOSIS — E538 Deficiency of other specified B group vitamins: Secondary | ICD-10-CM

## 2012-01-22 MED ORDER — CYANOCOBALAMIN 1000 MCG/ML IJ SOLN
1000.0000 ug | Freq: Once | INTRAMUSCULAR | Status: AC
  Administered 2012-01-22: 1000 ug via INTRAMUSCULAR

## 2012-01-23 DIAGNOSIS — M48061 Spinal stenosis, lumbar region without neurogenic claudication: Secondary | ICD-10-CM | POA: Diagnosis not present

## 2012-01-30 DIAGNOSIS — M48061 Spinal stenosis, lumbar region without neurogenic claudication: Secondary | ICD-10-CM | POA: Diagnosis not present

## 2012-02-11 ENCOUNTER — Telehealth: Payer: Self-pay | Admitting: Family Medicine

## 2012-02-11 DIAGNOSIS — R269 Unspecified abnormalities of gait and mobility: Secondary | ICD-10-CM | POA: Diagnosis not present

## 2012-02-11 DIAGNOSIS — M48061 Spinal stenosis, lumbar region without neurogenic claudication: Secondary | ICD-10-CM | POA: Diagnosis not present

## 2012-02-11 DIAGNOSIS — E1142 Type 2 diabetes mellitus with diabetic polyneuropathy: Secondary | ICD-10-CM | POA: Diagnosis not present

## 2012-02-11 DIAGNOSIS — M4802 Spinal stenosis, cervical region: Secondary | ICD-10-CM | POA: Diagnosis not present

## 2012-02-11 NOTE — Telephone Encounter (Signed)
Patient called stating that he need a refill of his glipizide and he has changed himself to 2 times per day and would like to have this sent to [facility] on [address]. Please advise.

## 2012-02-11 NOTE — Telephone Encounter (Signed)
Is this the correct dose? 

## 2012-02-12 NOTE — Telephone Encounter (Signed)
No I cannot approve going up to twice a day without seeing him, getting labs, etc. This medicine can dangerously drop the glucose too low if we are not careful. Call in for once a day, #[phone] with 11 rf

## 2012-02-13 MED ORDER — GLIPIZIDE 10 MG PO TABS: 10.0000 mg | ORAL_TABLET | Freq: Every morning | ORAL | Status: DC

## 2012-02-13 NOTE — Telephone Encounter (Signed)
I spoke with pt and went over the below information, also sent script e-scribe.

## 2012-02-19 DIAGNOSIS — M48061 Spinal stenosis, lumbar region without neurogenic claudication: Secondary | ICD-10-CM | POA: Diagnosis not present

## 2012-02-21 ENCOUNTER — Encounter: Payer: Self-pay | Admitting: Family Medicine

## 2012-02-21 ENCOUNTER — Ambulatory Visit (INDEPENDENT_AMBULATORY_CARE_PROVIDER_SITE_OTHER): Payer: Medicare Other | Admitting: Family Medicine

## 2012-02-21 VITALS — BP 132/58 | HR 96 | Temp 97.8°F | Wt 175.0 lb

## 2012-02-21 DIAGNOSIS — E539 Vitamin B deficiency, unspecified: Secondary | ICD-10-CM | POA: Diagnosis not present

## 2012-02-21 DIAGNOSIS — M79673 Pain in unspecified foot: Secondary | ICD-10-CM

## 2012-02-21 DIAGNOSIS — M79609 Pain in unspecified limb: Secondary | ICD-10-CM | POA: Diagnosis not present

## 2012-02-21 DIAGNOSIS — E119 Type 2 diabetes mellitus without complications: Secondary | ICD-10-CM | POA: Diagnosis not present

## 2012-02-21 MED ORDER — CYANOCOBALAMIN 1000 MCG/ML IJ SOLN
1000.0000 ug | Freq: Once | INTRAMUSCULAR | Status: AC
  Administered 2012-02-21: 1000 ug via INTRAMUSCULAR

## 2012-02-21 MED ORDER — GLIPIZIDE 10 MG PO TABS: 10.0000 mg | ORAL_TABLET | Freq: Two times a day (BID) | ORAL | Status: DC

## 2012-02-21 NOTE — Progress Notes (Signed)
  Subjective:    Patient ID: Andrew Norton, male    DOB: 02/15/1927, 78 y.o.   MRN: [id number]  HPI Here for 2 reasons. First , for the past month he has had sharp pains in the lateral left foot when he walks on it. No pain when he is off his feet. No recent trauma. The foot does not get red or warm. He had lumbar disc surgery per Dr. Shelle Iron in April and he is still having a difficult recovery from this. He is going to PT several days a week. The other issus is his diabetes. He had been taking Glipizide once a day in the mornings, but since the surgery his glucoses have been up to 140s and 150s in the evenings. He went ahead on his own and increased this to twice a day. Now the glucoses are steady in the range of 90 to 120 throughout the day. He feels better this way.    Review of Systems  Constitutional: Negative.   Musculoskeletal: Positive for back pain and arthralgias.       Objective:   Physical Exam  Constitutional: He appears well-developed and well-nourished.       Walks with a limp, using a cane   Musculoskeletal:       The left foot is slightly swollen, not warm or red. Mildly tender between the 4th and 5th metatarsals          Assessment & Plan:  His foot pain may be related to his altered gait after having the spinal surgery. 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 He is due to follow up with Dr. Shelle Iron next week, and I advised him to ask Dr. Shelle Iron about the foot. As for the diabetes we will increase the Glipizide to bid.

## 2012-02-25 DIAGNOSIS — M48061 Spinal stenosis, lumbar region without neurogenic claudication: Secondary | ICD-10-CM | POA: Diagnosis not present

## 2012-02-26 ENCOUNTER — Other Ambulatory Visit: Payer: Self-pay | Admitting: Family Medicine

## 2012-03-27 ENCOUNTER — Ambulatory Visit (INDEPENDENT_AMBULATORY_CARE_PROVIDER_SITE_OTHER): Payer: Medicare Other | Admitting: Family Medicine

## 2012-03-27 DIAGNOSIS — Z23 Encounter for immunization: Secondary | ICD-10-CM | POA: Diagnosis not present

## 2012-03-27 DIAGNOSIS — E539 Vitamin B deficiency, unspecified: Secondary | ICD-10-CM

## 2012-03-27 MED ORDER — CYANOCOBALAMIN 1000 MCG/ML IJ SOLN
1000.0000 ug | Freq: Once | INTRAMUSCULAR | Status: AC
  Administered 2012-03-27: 1000 ug via INTRAMUSCULAR

## 2012-04-10 ENCOUNTER — Ambulatory Visit (INDEPENDENT_AMBULATORY_CARE_PROVIDER_SITE_OTHER): Payer: Medicare Other | Admitting: Family Medicine

## 2012-04-10 ENCOUNTER — Encounter: Payer: Self-pay | Admitting: Family Medicine

## 2012-04-10 VITALS — BP 126/60 | HR 88 | Temp 97.7°F | Ht 70.5 in | Wt 170.0 lb

## 2012-04-10 DIAGNOSIS — N401 Enlarged prostate with lower urinary tract symptoms: Secondary | ICD-10-CM | POA: Diagnosis not present

## 2012-04-10 DIAGNOSIS — I10 Essential (primary) hypertension: Secondary | ICD-10-CM

## 2012-04-10 DIAGNOSIS — E119 Type 2 diabetes mellitus without complications: Secondary | ICD-10-CM | POA: Diagnosis not present

## 2012-04-10 DIAGNOSIS — M542 Cervicalgia: Secondary | ICD-10-CM

## 2012-04-10 DIAGNOSIS — N259 Disorder resulting from impaired renal tubular function, unspecified: Secondary | ICD-10-CM

## 2012-04-10 DIAGNOSIS — N138 Other obstructive and reflux uropathy: Secondary | ICD-10-CM

## 2012-04-10 DIAGNOSIS — E785 Hyperlipidemia, unspecified: Secondary | ICD-10-CM

## 2012-04-10 DIAGNOSIS — N139 Obstructive and reflux uropathy, unspecified: Secondary | ICD-10-CM

## 2012-04-10 LAB — CBC WITH DIFFERENTIAL/PLATELET
Basophils Absolute: 0 10*3/uL (ref 0.0–0.1)
Eosinophils Absolute: 0.3 10*3/uL (ref 0.0–0.7)
Hemoglobin: 10.6 g/dL — ABNORMAL LOW (ref 13.0–17.0)
Lymphocytes Relative: 16 % (ref 12.0–46.0)
MCHC: 32.7 g/dL (ref 30.0–36.0)
Neutro Abs: 4.7 10*3/uL (ref 1.4–7.7)
Platelets: 334 10*3/uL (ref 150.0–400.0)
RDW: 13.2 % (ref 11.5–14.6)

## 2012-04-10 LAB — BASIC METABOLIC PANEL
BUN: 16 mg/dL (ref 6–23)
CO2: 27 mEq/L (ref 19–32)
Calcium: 9.3 mg/dL (ref 8.4–10.5)
Chloride: 100 mEq/L (ref 96–112)
Creatinine, Ser: 0.8 mg/dL (ref 0.4–1.5)
Glucose, Bld: 107 mg/dL — ABNORMAL HIGH (ref 70–99)

## 2012-04-10 LAB — POCT URINALYSIS DIPSTICK
Ketones, UA: NEGATIVE
Leukocytes, UA: NEGATIVE
Nitrite, UA: NEGATIVE
Protein, UA: NEGATIVE
Urobilinogen, UA: 0.2
pH, UA: 7

## 2012-04-10 LAB — HEPATIC FUNCTION PANEL
ALT: 17 U/L (ref 0–53)
AST: 24 U/L (ref 0–37)
Albumin: 4 g/dL (ref 3.5–5.2)
Total Protein: 7.2 g/dL (ref 6.0–8.3)

## 2012-04-10 LAB — HEMOGLOBIN A1C: Hgb A1c MFr Bld: 8.1 % — ABNORMAL HIGH (ref 4.6–6.5)

## 2012-04-10 LAB — LIPID PANEL
Cholesterol: 95 mg/dL (ref 0–200)
HDL: 37.7 mg/dL — ABNORMAL LOW (ref >39.00)
Total CHOL/HDL Ratio: 3
Triglycerides: 135 mg/dL (ref 0.0–149.0)

## 2012-04-10 LAB — TSH: TSH: 2.46 u[IU]/mL (ref 0.35–5.50)

## 2012-04-10 MED ORDER — METFORMIN HCL 500 MG PO TABS: 500.0000 mg | ORAL_TABLET | Freq: Two times a day (BID) | ORAL | Status: DC

## 2012-04-10 MED ORDER — TAMSULOSIN HCL 0.4 MG PO CAPS: 0.4000 mg | ORAL_CAPSULE | Freq: Every day | ORAL | Status: DC

## 2012-04-10 MED ORDER — SIMVASTATIN 40 MG PO TABS: 40.0000 mg | ORAL_TABLET | Freq: Every day | ORAL | Status: DC

## 2012-04-10 MED ORDER — GLIPIZIDE 10 MG PO TABS: 10.0000 mg | ORAL_TABLET | Freq: Two times a day (BID) | ORAL | Status: DC

## 2012-04-10 MED ORDER — HYDROCODONE-ACETAMINOPHEN 5-325 MG PO TABS: 1.0000 | ORAL_TABLET | Freq: Two times a day (BID) | ORAL | Status: DC

## 2012-04-10 MED ORDER — LOSARTAN POTASSIUM 50 MG PO TABS: 50.0000 mg | ORAL_TABLET | Freq: Every morning | ORAL | Status: DC

## 2012-04-10 MED ORDER — GABAPENTIN 100 MG PO CAPS: 300.0000 mg | ORAL_CAPSULE | Freq: Four times a day (QID) | ORAL | Status: DC

## 2012-04-10 MED ORDER — CYCLOBENZAPRINE HCL 10 MG PO TABS: 10.0000 mg | ORAL_TABLET | Freq: Three times a day (TID) | ORAL | Status: DC | PRN

## 2012-04-10 NOTE — Progress Notes (Signed)
  Subjective:    Patient ID: Andrew Norton, male    DOB: 06/16/1927, 78 y.o.   MRN: [id number]  HPI 78 yr old male for a cpx. He feels well in general. He still has pains in his back and neck, also neuropathic burning pains in the feet. However he is tolerating these and does not want to change any of his treatments. He still drives and gets around with his cane. He does ask about frequent urinations however. He gets up 5-6 times every night to urinate. No discomfort, but he dribbles and feels like he never empties his bladder.    Review of Systems  Constitutional: Negative.   HENT: Negative.   Eyes: Negative.   Respiratory: Negative.   Cardiovascular: Negative.   Gastrointestinal: Negative.   Genitourinary: Positive for frequency and difficulty urinating. Negative for dysuria, urgency, hematuria, flank pain, decreased urine volume, scrotal swelling, enuresis, genital sores and testicular pain.  Musculoskeletal: Positive for back pain and gait problem. Negative for myalgias, joint swelling and arthralgias.  Skin: Negative.   Neurological: Negative.   Hematological: Negative.   Psychiatric/Behavioral: Negative.        Objective:   Physical Exam  Constitutional: He is oriented to person, place, and time. He appears well-developed and well-nourished. No distress.  HENT:  Head: Normocephalic and atraumatic.  Right Ear: External ear normal.  Left Ear: External ear normal.  Nose: Nose normal.  Mouth/Throat: Oropharynx is clear and moist. No oropharyngeal exudate.  Eyes: Conjunctivae normal and EOM are normal. Pupils are equal, round, and reactive to light. Right eye exhibits no discharge. Left eye exhibits no discharge. No scleral icterus.  Neck: Neck supple. No JVD present. No tracheal deviation present. No thyromegaly present.  Cardiovascular: Normal rate, regular rhythm, normal heart sounds and intact distal pulses.  Exam reveals no gallop and no friction rub.   No murmur heard.  EKG normal   Pulmonary/Chest: Effort normal and breath sounds normal. No respiratory distress. He has no wheezes. He has no rales. He exhibits no tenderness.  Abdominal: Soft. Bowel sounds are normal. He exhibits no distension and no mass. There is no tenderness. There is no rebound and no guarding.  Genitourinary: Rectum normal, prostate normal and penis normal. Guaiac negative stool. No penile tenderness.  Musculoskeletal: Normal range of motion. He exhibits no edema and no tenderness.  Lymphadenopathy:    He has no cervical adenopathy.  Neurological: He is alert and oriented to person, place, and time. He has normal reflexes. No cranial nerve deficit. He exhibits normal muscle tone. Coordination normal.  Skin: Skin is warm and dry. No rash noted. He is not diaphoretic. No erythema. No pallor.  Psychiatric: He has a normal mood and affect. His behavior is normal. Judgment and thought content normal.          Assessment & Plan:  Well exam. Try Flomax for the BPH symptoms.

## 2012-04-14 ENCOUNTER — Encounter: Payer: Self-pay | Admitting: Family Medicine

## 2012-04-14 NOTE — Progress Notes (Signed)
Quick Note:  Pt declined to increase the Metformin. ______

## 2012-04-14 NOTE — Progress Notes (Signed)
Quick Note:  I spoke with pt and put a copy of results in mail. ______ 

## 2012-04-24 ENCOUNTER — Ambulatory Visit (INDEPENDENT_AMBULATORY_CARE_PROVIDER_SITE_OTHER): Payer: Medicare Other | Admitting: Family Medicine

## 2012-04-24 DIAGNOSIS — E539 Vitamin B deficiency, unspecified: Secondary | ICD-10-CM | POA: Diagnosis not present

## 2012-04-24 MED ORDER — CYANOCOBALAMIN 1000 MCG/ML IJ SOLN
1000.0000 ug | Freq: Once | INTRAMUSCULAR | Status: AC
  Administered 2012-04-24: 1000 ug via INTRAMUSCULAR

## 2012-05-11 IMAGING — CR DG SPINE 1V PORT
1 series · 1 of 1 positions shown · non-contrast
Comparison: Same day

CLINICAL DATA: Intraoperative localization.

PORTABLE SPINE - 1 VIEW

[lateral]
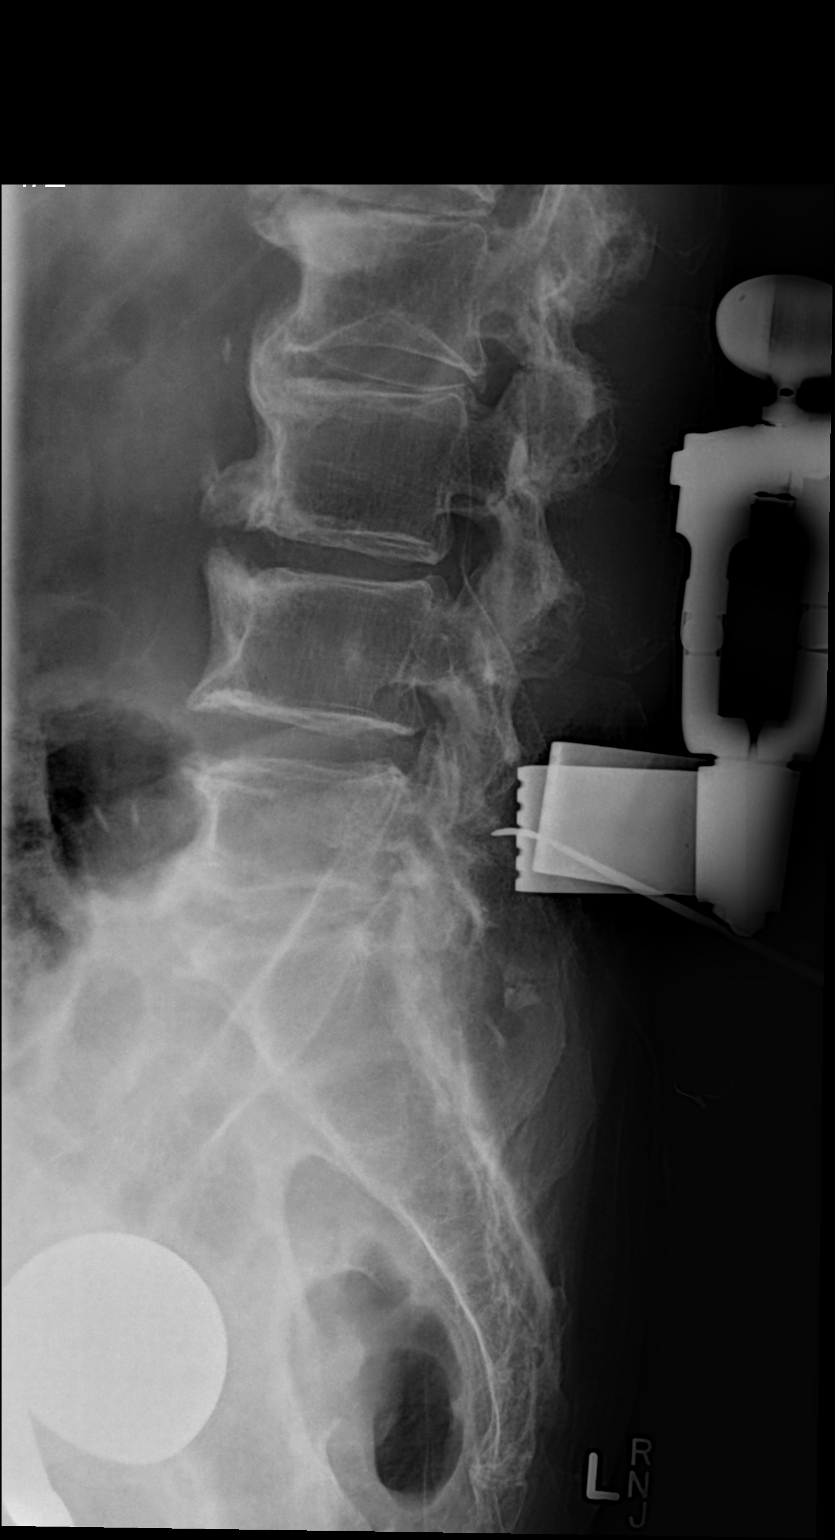

[1 of 1 positions shown; findings below may reference images not displayed]

FINDINGS: Tissue spreaders are in place posteriorly.  There is a
clamp posterior to the pedicle level of L5.
IMPRESSION: Clamp at the pedicle level of L5.

## 2012-05-28 ENCOUNTER — Ambulatory Visit (INDEPENDENT_AMBULATORY_CARE_PROVIDER_SITE_OTHER): Payer: Medicare Other | Admitting: Family Medicine

## 2012-05-28 DIAGNOSIS — E539 Vitamin B deficiency, unspecified: Secondary | ICD-10-CM

## 2012-05-28 MED ORDER — CYANOCOBALAMIN 1000 MCG/ML IJ SOLN
1000.0000 ug | Freq: Once | INTRAMUSCULAR | Status: AC
  Administered 2012-05-28: 1000 ug via INTRAMUSCULAR

## 2012-06-27 ENCOUNTER — Ambulatory Visit (INDEPENDENT_AMBULATORY_CARE_PROVIDER_SITE_OTHER): Payer: Medicare Other | Admitting: Family Medicine

## 2012-06-27 ENCOUNTER — Encounter: Payer: Self-pay | Admitting: Family Medicine

## 2012-06-27 VITALS — BP 140/60 | HR 93 | Temp 97.9°F | Wt 172.0 lb

## 2012-06-27 DIAGNOSIS — E119 Type 2 diabetes mellitus without complications: Secondary | ICD-10-CM | POA: Diagnosis not present

## 2012-06-27 DIAGNOSIS — M545 Low back pain: Secondary | ICD-10-CM | POA: Diagnosis not present

## 2012-06-27 DIAGNOSIS — I10 Essential (primary) hypertension: Secondary | ICD-10-CM | POA: Diagnosis not present

## 2012-06-27 DIAGNOSIS — E539 Vitamin B deficiency, unspecified: Secondary | ICD-10-CM

## 2012-06-27 DIAGNOSIS — M25559 Pain in unspecified hip: Secondary | ICD-10-CM

## 2012-06-27 MED ORDER — FINASTERIDE 5 MG PO TABS: 5.0000 mg | ORAL_TABLET | Freq: Every day | ORAL | Status: DC

## 2012-06-27 MED ORDER — CYANOCOBALAMIN 1000 MCG/ML IJ SOLN
1000.0000 ug | Freq: Once | INTRAMUSCULAR | Status: AC
  Administered 2012-06-27: 1000 ug via INTRAMUSCULAR

## 2012-06-27 NOTE — Progress Notes (Signed)
  Subjective:    Patient ID: Andrew Norton, male    DOB: September 24, 1926, 78 y.o.   MRN: [id number]  HPI Here to follow up on issues. He feels well in general except for getting up to urinate 5-6 times a night. Also his left hip is causing him some pain. He wants to get off al narcotics and he asks my advice. He has tried Flomax but has seen no improvement. His am fasting glucoses have been stable in the range of 90 to 120.    Review of Systems  Constitutional: Negative.   Respiratory: Negative.   Cardiovascular: Negative.   Genitourinary: Positive for frequency and difficulty urinating.       Objective:   Physical Exam  Constitutional: He is oriented to person, place, and time. He appears well-developed and well-nourished.       Walks with a slight limp, using a cane  Cardiovascular: Normal rate, regular rhythm, normal heart sounds and intact distal pulses.   Pulmonary/Chest: Effort normal and breath sounds normal.  Neurological: He is alert and oriented to person, place, and time.          Assessment & Plan:  His diabetes is stable. We will stop the Vicodin and go with Tylenol 500 mg , two tabs bid. He will stop Flomax and try Finasteride 5 mg a day. We will refer to Dr. Ethelene Hal to address the hip pain.

## 2012-07-01 ENCOUNTER — Other Ambulatory Visit: Payer: Self-pay | Admitting: Family Medicine

## 2012-07-23 ENCOUNTER — Other Ambulatory Visit (HOSPITAL_COMMUNITY): Payer: Self-pay | Admitting: Specialist

## 2012-07-23 DIAGNOSIS — M25552 Pain in left hip: Secondary | ICD-10-CM

## 2012-07-25 ENCOUNTER — Ambulatory Visit (INDEPENDENT_AMBULATORY_CARE_PROVIDER_SITE_OTHER): Payer: Medicare Other | Admitting: Family Medicine

## 2012-07-25 DIAGNOSIS — E539 Vitamin B deficiency, unspecified: Secondary | ICD-10-CM | POA: Diagnosis not present

## 2012-07-25 MED ORDER — CYANOCOBALAMIN 1000 MCG/ML IJ SOLN
1000.0000 ug | Freq: Once | INTRAMUSCULAR | Status: AC
  Administered 2012-07-25: 1000 ug via INTRAMUSCULAR

## 2012-08-06 ENCOUNTER — Encounter (HOSPITAL_COMMUNITY)
Admission: RE | Admit: 2012-08-06 | Discharge: 2012-08-06 | Disposition: A | Discharge status: Home / Self Care | Payer: Medicare Other | Source: Ambulatory Visit | Attending: Specialist | Admitting: Specialist

## 2012-08-06 ENCOUNTER — Encounter (HOSPITAL_COMMUNITY): Payer: Self-pay

## 2012-08-06 DIAGNOSIS — M25559 Pain in unspecified hip: Secondary | ICD-10-CM | POA: Diagnosis not present

## 2012-08-06 DIAGNOSIS — Z96649 Presence of unspecified artificial hip joint: Secondary | ICD-10-CM | POA: Insufficient documentation

## 2012-08-06 DIAGNOSIS — M25552 Pain in left hip: Secondary | ICD-10-CM

## 2012-08-06 MED ORDER — TECHNETIUM TC 99M MEDRONATE IV KIT
24.6000 | PACK | Freq: Once | INTRAVENOUS | Status: AC | PRN
  Administered 2012-08-06: 24.6 via INTRAVENOUS

## 2012-08-07 ENCOUNTER — Other Ambulatory Visit: Payer: Self-pay | Admitting: Family Medicine

## 2012-08-13 DIAGNOSIS — M79609 Pain in unspecified limb: Secondary | ICD-10-CM | POA: Diagnosis not present

## 2012-08-13 DIAGNOSIS — M25559 Pain in unspecified hip: Secondary | ICD-10-CM | POA: Diagnosis not present

## 2012-08-15 DIAGNOSIS — E1142 Type 2 diabetes mellitus with diabetic polyneuropathy: Secondary | ICD-10-CM | POA: Diagnosis not present

## 2012-08-15 DIAGNOSIS — G544 Lumbosacral root disorders, not elsewhere classified: Secondary | ICD-10-CM | POA: Diagnosis not present

## 2012-08-22 DIAGNOSIS — M25559 Pain in unspecified hip: Secondary | ICD-10-CM | POA: Diagnosis not present

## 2012-08-25 ENCOUNTER — Ambulatory Visit: Payer: Medicare Other | Admitting: Family Medicine

## 2012-09-17 ENCOUNTER — Ambulatory Visit (INDEPENDENT_AMBULATORY_CARE_PROVIDER_SITE_OTHER): Payer: Medicare Other | Admitting: Family Medicine

## 2012-09-17 DIAGNOSIS — E538 Deficiency of other specified B group vitamins: Secondary | ICD-10-CM

## 2012-09-17 MED ORDER — CYANOCOBALAMIN 1000 MCG/ML IJ SOLN
1000.0000 ug | Freq: Once | INTRAMUSCULAR | Status: AC
  Administered 2012-09-17: 1000 ug via INTRAMUSCULAR

## 2012-09-29 ENCOUNTER — Other Ambulatory Visit: Payer: Self-pay | Admitting: Family Medicine

## 2012-10-01 NOTE — Telephone Encounter (Signed)
Can we refill this? 

## 2012-10-07 DIAGNOSIS — L570 Actinic keratosis: Secondary | ICD-10-CM | POA: Diagnosis not present

## 2012-10-07 DIAGNOSIS — L259 Unspecified contact dermatitis, unspecified cause: Secondary | ICD-10-CM | POA: Diagnosis not present

## 2012-10-07 DIAGNOSIS — C44319 Basal cell carcinoma of skin of other parts of face: Secondary | ICD-10-CM | POA: Diagnosis not present

## 2012-10-14 ENCOUNTER — Other Ambulatory Visit: Payer: Self-pay | Admitting: Family Medicine

## 2012-10-14 NOTE — Telephone Encounter (Signed)
Call in #[phone] with 5 rf 

## 2012-10-22 ENCOUNTER — Encounter (HOSPITAL_COMMUNITY): Payer: Self-pay | Admitting: Pharmacy Technician

## 2012-10-22 DIAGNOSIS — E119 Type 2 diabetes mellitus without complications: Secondary | ICD-10-CM | POA: Diagnosis not present

## 2012-10-22 NOTE — Progress Notes (Signed)
Surgery on 11/05/12.  Preop appointment on 10/23/12.  Need orders in EPIC.  Thanks.

## 2012-10-22 NOTE — Patient Instructions (Addendum)
20 Andrew Norton  10/22/2012   Your procedure is scheduled on: 4-23   -2014  Report to [facility] at       1200 noon.  Call this number if you have problems the morning of surgery: [phone]  Or Presurgical Testing [phone](Remon Quinto)     Do not eat food:After Midnight.  May have clear liquids:up to 6 Hours before arrival. Nothing after : 0900 AM  Clear liquids include soda, tea, black coffee, apple or grape juice, broth.  Take these medicines the morning of surgery with A SIP OF WATER: Gabapentin. Hydrocodone.          Do not take any Diabetic meds or insulin AM of surgery.   Do not wear jewelry, make-up or nail polish.  Do not wear lotions, powders, or perfumes. You may wear deodorant.  Do not shave 12 hours prior to first CHG shower(legs and under arms).(face and neck okay.)  Do not bring valuables to the hospital.  Contacts, dentures or bridgework,body piercing,  may not be worn into surgery.  Leave suitcase in the car. After surgery it may be brought to your room.  For patients admitted to the hospital, checkout time is 11:00 AM the day of discharge.   Patients discharged the day of surgery will not be allowed to drive home. Must have responsible person with you x 24 hours once discharged.  Name and phone number of your driver: Andrew Norton, spouse [phone]/ [phone]  Special Instructions: CHG(Chlorhedine 4%-"Hibiclens","Betasept","Aplicare") Shower Use Special Wash: see special instructions.(avoid face and genitals)   Please read over the following fact sheets that you were given: MRSA Information, Blood Transfusion fact sheet, Incentive Spirometry Instruction.    Failure to follow these instructions may result in Cancellation of your surgery.   Patient signature_______________________________________________________

## 2012-10-23 ENCOUNTER — Other Ambulatory Visit: Payer: Self-pay | Admitting: Orthopedic Surgery

## 2012-10-23 ENCOUNTER — Other Ambulatory Visit (HOSPITAL_COMMUNITY): Payer: Self-pay | Admitting: Orthopedic Surgery

## 2012-10-23 ENCOUNTER — Encounter (HOSPITAL_COMMUNITY)
Admission: RE | Admit: 2012-10-23 | Discharge: 2012-10-23 | Disposition: A | Discharge status: Home / Self Care | Payer: Medicare Other | Source: Ambulatory Visit | Attending: Orthopedic Surgery | Admitting: Orthopedic Surgery

## 2012-10-23 ENCOUNTER — Ambulatory Visit (HOSPITAL_COMMUNITY)
Admission: RE | Admit: 2012-10-23 | Discharge: 2012-10-23 | Disposition: A | Discharge status: Home / Self Care | Payer: Medicare Other | Source: Ambulatory Visit | Attending: Orthopedic Surgery | Admitting: Orthopedic Surgery

## 2012-10-23 ENCOUNTER — Encounter (HOSPITAL_COMMUNITY): Payer: Self-pay

## 2012-10-23 DIAGNOSIS — K5900 Constipation, unspecified: Secondary | ICD-10-CM | POA: Diagnosis not present

## 2012-10-23 DIAGNOSIS — Z96649 Presence of unspecified artificial hip joint: Secondary | ICD-10-CM | POA: Diagnosis not present

## 2012-10-23 DIAGNOSIS — Z01812 Encounter for preprocedural laboratory examination: Secondary | ICD-10-CM | POA: Insufficient documentation

## 2012-10-23 DIAGNOSIS — T84498A Other mechanical complication of other internal orthopedic devices, implants and grafts, initial encounter: Secondary | ICD-10-CM | POA: Insufficient documentation

## 2012-10-23 DIAGNOSIS — Z01818 Encounter for other preprocedural examination: Secondary | ICD-10-CM | POA: Insufficient documentation

## 2012-10-23 DIAGNOSIS — J984 Other disorders of lung: Secondary | ICD-10-CM | POA: Diagnosis not present

## 2012-10-23 DIAGNOSIS — M169 Osteoarthritis of hip, unspecified: Secondary | ICD-10-CM | POA: Diagnosis not present

## 2012-10-23 DIAGNOSIS — E119 Type 2 diabetes mellitus without complications: Secondary | ICD-10-CM | POA: Insufficient documentation

## 2012-10-23 DIAGNOSIS — I10 Essential (primary) hypertension: Secondary | ICD-10-CM | POA: Insufficient documentation

## 2012-10-23 DIAGNOSIS — M25559 Pain in unspecified hip: Secondary | ICD-10-CM | POA: Diagnosis not present

## 2012-10-23 DIAGNOSIS — Z01811 Encounter for preprocedural respiratory examination: Secondary | ICD-10-CM | POA: Diagnosis not present

## 2012-10-23 DIAGNOSIS — Y831 Surgical operation with implant of artificial internal device as the cause of abnormal reaction of the patient, or of later complication, without mention of misadventure at the time of the procedure: Secondary | ICD-10-CM | POA: Insufficient documentation

## 2012-10-23 HISTORY — DX: Unspecified hearing loss, unspecified ear: H91.90

## 2012-10-23 HISTORY — DX: Personal history of other infectious and parasitic diseases: Z86.19

## 2012-10-23 LAB — COMPREHENSIVE METABOLIC PANEL
Alkaline Phosphatase: 108 U/L (ref 39–117)
BUN: 18 mg/dL (ref 6–23)
CO2: 28 mEq/L (ref 19–32)
Calcium: 9.3 mg/dL (ref 8.4–10.5)
GFR calc Af Amer: >90 mL/min (ref >90)
GFR calc non Af Amer: 79 mL/min — ABNORMAL LOW (ref >90)
Glucose, Bld: 185 mg/dL — ABNORMAL HIGH (ref 70–99)
Potassium: 6 mEq/L — ABNORMAL HIGH (ref 3.5–5.1)
Total Protein: 7 g/dL (ref 6.0–8.3)

## 2012-10-23 LAB — URINALYSIS, ROUTINE W REFLEX MICROSCOPIC
Bilirubin Urine: NEGATIVE
Glucose, UA: >1000 mg/dL — AB
Hgb urine dipstick: NEGATIVE
Ketones, ur: NEGATIVE mg/dL
Leukocytes, UA: NEGATIVE
Protein, ur: NEGATIVE mg/dL

## 2012-10-23 LAB — CBC
HCT: 32 % — ABNORMAL LOW (ref 39.0–52.0)
Hemoglobin: 10.7 g/dL — ABNORMAL LOW (ref 13.0–17.0)
MCH: 30.1 pg (ref 26.0–34.0)
MCHC: 33.4 g/dL (ref 30.0–36.0)
RBC: 3.56 MIL/uL — ABNORMAL LOW (ref 4.22–5.81)

## 2012-10-23 LAB — PROTIME-INR: Prothrombin Time: 14.1 seconds (ref 11.6–15.2)

## 2012-10-23 LAB — URINE MICROSCOPIC-ADD ON

## 2012-10-23 MED ORDER — DEXAMETHASONE SODIUM PHOSPHATE 10 MG/ML IJ SOLN: 10.0000 mg | Freq: Once | INTRAMUSCULAR | Status: DC

## 2012-10-23 MED ORDER — BUPIVACAINE LIPOSOME 1.3 % IJ SUSP: 20.0000 mL | Freq: Once | INTRAMUSCULAR | Status: DC

## 2012-10-23 NOTE — Progress Notes (Signed)
10-23-12  Labs viewable in Epic-please note Potassium level. W. Kennon Portela

## 2012-10-23 NOTE — Pre-Procedure Instructions (Addendum)
10-23-12 EKG 9'13-Epic. CBC, CMP, PT,PTT,UA, PCR screen, CXR, Lt.Hip Xray -done today.W. Maclovia Uher,RN 10-23-12 Potassium level noted and sent to Dr. Deri Fuelling fax module attention [name], PA-labs viewable in Epic. Also call to office and spoke with Lind Covert. W. Kennon Portela

## 2012-10-23 NOTE — Progress Notes (Signed)
Preoperative surgical orders have been place into the Epic hospital system for Andrew Norton on 10/23/2012, 2:42 PM  by Patrica Duel for surgery on 11/05/2012.  Preop Total Hip orders including Experel Injecion, IV Tylenol, and IV Decadron as long as there are no contraindications to the above medications. [name], PA-C

## 2012-11-04 MED ORDER — VANCOMYCIN HCL 10 G IV SOLR
1500.0000 mg | INTRAVENOUS | Status: AC
  Administered 2012-11-05: 1000 mg via INTRAVENOUS
  Filled 2012-11-04: qty 1500

## 2012-11-04 NOTE — H&P (Signed)
TOTAL HIP REVISION ADMISSION H&P  Patient is admitted for left revision total hip arthroplasty.  Subjective:  Chief Complaint: left hip pain  HPI: Andrew Norton, 79 y.o. male, has a history of pain and functional disability in the left hip due to arthritis and patient has failed non-surgical conservative treatments for greater than 12 weeks to include NSAID's and/or analgesics, use of assistive devices and activity modification. The indications for the revision total hip arthroplasty are severe degenerative changes within the acetabulum since the left hip hemiarthroplasty.  Onset of symptoms was gradual starting 2 years ago with gradually worsening course since that time.  Prior procedures on the left hip include hemi-arthroplasty.  Patient currently rates pain in the left hip at 6 out of 10 with activity.  There is night pain, worsening of pain with activity and weight bearing, pain that interfers with activities of daily living and pain with passive range of motion. Patient has evidence of periarticular osteophytes and joint space narrowing by imaging studies.  This condition presents safety issues increasing the risk of falls.  This patient has had failure of hemi-arthroplasty.  There is no current active infection.  Patient Active Problem List   Diagnosis Date Noted  . HYPERKALEMIA 10/26/2009  . VITAMIN B12 DEFICIENCY 09/27/2009  . CARPAL TUNNEL SYNDROME 09/27/2009  . 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 CHEST PAIN 12/01/2008  . NECK PAIN, CHRONIC 07/01/2008  . METHYLMALONIC ACIDEMIA 02/09/2008  . ANXIETY STATE, UNSPECIFIED 12/29/2007  . LEG CRAMPS, NOCTURNAL 12/29/2007  . SHINGLES 12/15/2007  . HEARING LOSS 08/11/2007  . DYSFUNCTION OF EUSTACHIAN TUBE 08/04/2007  . BACK PAIN, LUMBAR 07/30/2007  . HYPERTENSION 07/21/2007  . ACUTE BRONCHITIS 07/21/2007  . HYPERLIPIDEMIA 05/12/2007  . RENAL INSUFFICIENCY 05/12/2007  . DIABETES MELLITUS, TYPE II 12/11/2006  . ANEMIA-NOS 12/11/2006   . HAY FEVER 12/11/2006  . PAGET'S DISEASE 12/11/2006  . TB SKIN TEST, POSITIVE 12/11/2006   Past Medical History  Diagnosis Date  . Anemia   . Diabetes mellitus   . Hyperlipidemia   . Renal insufficiency   . Paget's disease     scrotum  . Positive PPD, treated   . Hypertension     EKG, chest x ray 11/15/10 [facility],  clearance Dr Abran Cantor on chart/ pt states on meds to "prevent hypertension from diabetes"  . Carpal tunnel syndrome     left leg sciatica  . Easy bruising   . Stroke     SPINAL CORD STROKE PER OV note 3/13  DR LOVE CLEARANCE NOTE_  ON CHART WITH RECOMMENDATIONS-   has weakness  upper extremities  . Arthritis     hips  . Hearing loss     left ear greater loss  . Cancer     skin can lesions, and non cancer skin lesions-follwed by Dr. Margo Aye  . History of shingles     past hx. left hand    Past Surgical History  Procedure Laterality Date  . Tonsillectomy    . Partial removal of scrotum    . Cervical fusion      dr [name] 2009- retained hardware  . Hip fracture surgery  11-24-10    left hip per Dr, Malon Kindle  . Colonoscopy  2-08    per Dr. Randa Evens, normal   . Eye surgery      bilateral cataract extraction with IOL  . Lumbar laminectomy/decompression microdiscectomy  11/07/2011    Procedure: LUMBAR LAMINECTOMY/DECOMPRESSION MICRODISCECTOMY;  Surgeon: Javier Docker, MD;  Location: [facility];  Service: Orthopedics;  Laterality: N/A;  Decompression of the L4 - L5 Central (X-ray)      Current outpatient prescriptions: aspirin 81 MG chewable tablet, Chew 81 mg by mouth daily., Disp: , Rfl: ;   cyanocobalamin (,VITAMIN B-12,) 1000 MCG/ML injection, Inject 1,000 mcg into the muscle every 30 (thirty) days., Disp: , Rfl: ;   cyclobenzaprine (FLEXERIL) 10 MG tablet, Take 1 tablet (10 mg total) by mouth 3 (three) times daily as needed for muscle spasms., Disp: 270 tablet, Rfl: 3 ferrous sulfate 220 (44 FE) MG/5ML solution, TAKE BY MOUTH DAILY., Disp: 473 mL, Rfl: 0;   finasteride (PROSCAR) 5 MG tablet, Take 1 tablet (5 mg total) by mouth daily., Disp: 90 tablet, Rfl: 3;  gabapentin (NEURONTIN) 100 MG capsule, Take 3 capsules (300 mg total) by mouth 4 (four) times daily., Disp: 12 capsule, Rfl: 0 glipiZIDE (GLUCOTROL) 10 MG tablet, Take 1 tablet (10 mg total) by mouth 2 (two) times daily before a meal., Disp: 180 tablet, Rfl: 3;   HYDROcodone-acetaminophen (NORCO/VICODIN) 5-325 MG per tablet, Take 1 tablet by mouth 2 (two) times daily as needed for pain., Disp: , Rfl: ;   lidocaine (LIDODERM) 5 %, Place 1 patch onto the skin daily. Applies to lower back, Disp: , Rfl:  losartan (COZAAR) 50 MG tablet, Take 1 tablet (50 mg total) by mouth every morning., Disp: 90 tablet, Rfl: 3;   metFORMIN (GLUCOPHAGE) 500 MG tablet, Take 500 mg by mouth 2 (two) times daily with a meal., Disp: , Rfl: ;  polyethylene glycol powder (GLYCOLAX/MIRALAX) powder, Take 17 g by mouth daily. , Disp: , Rfl: ;  simvastatin (ZOCOR) 40 MG tablet, Take 40 mg by mouth every evening., Disp: , Rfl:   Allergies  Allergen Reactions  . Penicillins Hives    History  Substance Use Topics  . Smoking status: Never Smoker   . Smokeless tobacco: Never Used  . Alcohol Use: No    Family History  Problem Relation Age of Onset  . Tuberculosis        Review of Systems  Constitutional: Negative.   HENT: Positive for hearing loss and tinnitus. Negative for ear pain, nosebleeds, congestion, sore throat, neck pain and ear discharge.   Eyes: Negative.   Respiratory: Positive for shortness of breath. Negative for cough, hemoptysis, sputum production, wheezing and stridor.        SOB with exertion  Cardiovascular: Negative.   Gastrointestinal: Negative.   Genitourinary: Negative.   Musculoskeletal: Positive for back pain and joint pain. Negative for myalgias and falls.       Left hip pain  Skin: Negative.   Neurological: Negative.  Negative for headaches.  Endo/Heme/Allergies: Negative.      Objective:  Physical Exam  Constitutional: He is oriented to person, place, and time. He appears well-developed and well-nourished. No distress.  HENT:  Head: Normocephalic and atraumatic.  Right Ear: External ear normal.  Left Ear: External ear normal.  Nose: Nose normal.  Mouth/Throat: Oropharynx is clear and moist.  Eyes: Conjunctivae and EOM are normal.  Neck: Normal range of motion. Neck supple. No tracheal deviation present. No thyromegaly present.  Cardiovascular: Normal rate, regular rhythm, normal heart sounds and intact distal pulses.   No murmur heard. Respiratory: Effort normal and breath sounds normal. No respiratory distress. He has no wheezes. He exhibits no tenderness.  GI: Soft. Bowel sounds are normal. He exhibits no distension and no mass. There is no tenderness.  Musculoskeletal:  Right hip: Normal.       Left hip: He exhibits decreased range of motion and decreased strength. He exhibits no tenderness.       Right knee: Normal.       Left knee: Normal.       Right lower leg: He exhibits no tenderness and no swelling.       Left lower leg: He exhibits no tenderness and no swelling.  Right hip normal range of motion with no discomfort. Left hip flexion 90, about 10 internal rotation and 20 external rotation, 20 abduction with pain. There is no tenderness over the greater trochanter. He has significant antalgic gait pattern and basically cannot put full weight on that leg without it buckling and without pain.  Lymphadenopathy:    He has no cervical adenopathy.  Neurological: He is alert and oriented to person, place, and time. He has normal strength and normal reflexes. No sensory deficit.  Skin: No rash noted. He is not diaphoretic. No erythema.  Psychiatric: He has a normal mood and affect. His behavior is normal.   Vitals Pulse: 66 (Regular) BP: 126/68 (Sitting, Left Arm, Standard)  Estimated body mass index is 24.32 kg/(m^2) as calculated  from the following:   Height as of 04/10/12: 5' 10.5" (1.791 m).   Weight as of 06/27/12: 78.019 kg (172 lb).  Imaging Review:  Plain radiographs demonstrate severe degenerative joint disease of the left hip(s). There is no evidence of loosening of the femoral stem.The bone quality appears to be fair for age and reported activity level. AP pelvis and lateral left hip shows that he has complete loss of the acetabular joint space. Thus, the bipolar head is contacting on bone. He also has large osteophytes present around the acetabulum. The femoral component which appears to be a Trilock is in good position with no abnormalities. Bone scan shows he has significant amount of uptake around the acetabulum also consistent with acetabular bony erosion.  Assessment/Plan:  End stage arthritis, left hip(s) with failed hemiarthroplasty.  The patient history, physical examination, clinical judgement of the provider and imaging studies are consistent with end stage degenerative joint disease of the left hip(s), previous total hip arthroplasty. Revision total hip arthroplasty is deemed medically necessary. The treatment options including medical management, injection therapy, arthroscopy and arthroplasty were discussed at length. The risks and benefits of total hip arthroplasty were presented and reviewed. The risks due to aseptic loosening, infection, stiffness, dislocation/subluxation,  thromboembolic complications and other imponderables were discussed.  The patient acknowledged the explanation, agreed to proceed with the plan and consent was signed. Patient is being admitted for inpatient treatment for surgery, pain control, PT, OT, prophylactic antibiotics, VTE prophylaxis, progressive ambulation and ADL's and discharge planning. The patient is planning to be discharged to skilled nursing facility    [place], [place]

## 2012-11-05 ENCOUNTER — Inpatient Hospital Stay (HOSPITAL_COMMUNITY)
Admission: RE | Admit: 2012-11-05 | Discharge: 2012-11-08 | DRG: 468 | Disposition: A | Discharge status: Skilled Nursing Facility | Payer: Medicare Other | Source: Ambulatory Visit | Attending: Orthopedic Surgery | Admitting: Orthopedic Surgery

## 2012-11-05 ENCOUNTER — Encounter (HOSPITAL_COMMUNITY): Payer: Self-pay | Admitting: Certified Registered Nurse Anesthetist

## 2012-11-05 ENCOUNTER — Inpatient Hospital Stay (HOSPITAL_COMMUNITY): Payer: Medicare Other

## 2012-11-05 ENCOUNTER — Inpatient Hospital Stay (HOSPITAL_COMMUNITY): Payer: Medicare Other | Admitting: Certified Registered Nurse Anesthetist

## 2012-11-05 ENCOUNTER — Encounter (HOSPITAL_COMMUNITY): Payer: Self-pay | Admitting: *Deleted

## 2012-11-05 ENCOUNTER — Encounter (HOSPITAL_COMMUNITY)
Admission: RE | Disposition: A | Discharge status: Skilled Nursing Facility | Payer: Self-pay | Source: Ambulatory Visit | Attending: Orthopedic Surgery

## 2012-11-05 DIAGNOSIS — T8484XD Pain due to internal orthopedic prosthetic devices, implants and grafts, subsequent encounter: Secondary | ICD-10-CM

## 2012-11-05 DIAGNOSIS — H9190 Unspecified hearing loss, unspecified ear: Secondary | ICD-10-CM | POA: Diagnosis present

## 2012-11-05 DIAGNOSIS — Z471 Aftercare following joint replacement surgery: Secondary | ICD-10-CM | POA: Diagnosis not present

## 2012-11-05 DIAGNOSIS — E1149 Type 2 diabetes mellitus with other diabetic neurological complication: Secondary | ICD-10-CM | POA: Diagnosis not present

## 2012-11-05 DIAGNOSIS — N289 Disorder of kidney and ureter, unspecified: Secondary | ICD-10-CM | POA: Diagnosis present

## 2012-11-05 DIAGNOSIS — T84099A Other mechanical complication of unspecified internal joint prosthesis, initial encounter: Secondary | ICD-10-CM | POA: Diagnosis not present

## 2012-11-05 DIAGNOSIS — D649 Anemia, unspecified: Secondary | ICD-10-CM | POA: Diagnosis not present

## 2012-11-05 DIAGNOSIS — D518 Other vitamin B12 deficiency anemias: Secondary | ICD-10-CM | POA: Diagnosis not present

## 2012-11-05 DIAGNOSIS — M6281 Muscle weakness (generalized): Secondary | ICD-10-CM | POA: Diagnosis not present

## 2012-11-05 DIAGNOSIS — E785 Hyperlipidemia, unspecified: Secondary | ICD-10-CM | POA: Diagnosis present

## 2012-11-05 DIAGNOSIS — I69998 Other sequelae following unspecified cerebrovascular disease: Secondary | ICD-10-CM | POA: Diagnosis not present

## 2012-11-05 DIAGNOSIS — Z96649 Presence of unspecified artificial hip joint: Secondary | ICD-10-CM | POA: Diagnosis not present

## 2012-11-05 DIAGNOSIS — M129 Arthropathy, unspecified: Secondary | ICD-10-CM | POA: Diagnosis not present

## 2012-11-05 DIAGNOSIS — R269 Unspecified abnormalities of gait and mobility: Secondary | ICD-10-CM | POA: Diagnosis not present

## 2012-11-05 DIAGNOSIS — R5381 Other malaise: Secondary | ICD-10-CM | POA: Diagnosis present

## 2012-11-05 DIAGNOSIS — I10 Essential (primary) hypertension: Secondary | ICD-10-CM | POA: Diagnosis present

## 2012-11-05 DIAGNOSIS — E119 Type 2 diabetes mellitus without complications: Secondary | ICD-10-CM | POA: Diagnosis not present

## 2012-11-05 DIAGNOSIS — M1990 Unspecified osteoarthritis, unspecified site: Secondary | ICD-10-CM | POA: Diagnosis not present

## 2012-11-05 DIAGNOSIS — Y831 Surgical operation with implant of artificial internal device as the cause of abnormal reaction of the patient, or of later complication, without mention of misadventure at the time of the procedure: Secondary | ICD-10-CM | POA: Diagnosis present

## 2012-11-05 DIAGNOSIS — T8484XA Pain due to internal orthopedic prosthetic devices, implants and grafts, initial encounter: Secondary | ICD-10-CM

## 2012-11-05 DIAGNOSIS — M25559 Pain in unspecified hip: Secondary | ICD-10-CM | POA: Diagnosis not present

## 2012-11-05 DIAGNOSIS — N19 Unspecified kidney failure: Secondary | ICD-10-CM | POA: Diagnosis not present

## 2012-11-05 DIAGNOSIS — T84019A Broken internal joint prosthesis, unspecified site, initial encounter: Secondary | ICD-10-CM | POA: Diagnosis not present

## 2012-11-05 HISTORY — PX: TOTAL HIP REVISION: SHX763

## 2012-11-05 LAB — BASIC METABOLIC PANEL
GFR calc Af Amer: >90 mL/min (ref >90)
GFR calc non Af Amer: 81 mL/min — ABNORMAL LOW (ref >90)
Potassium: 4.6 mEq/L (ref 3.5–5.1)
Sodium: 140 mEq/L (ref 135–145)

## 2012-11-05 LAB — TYPE AND SCREEN: Antibody Screen: NEGATIVE

## 2012-11-05 LAB — GLUCOSE, CAPILLARY: Glucose-Capillary: 143 mg/dL — ABNORMAL HIGH (ref 70–99)

## 2012-11-05 SURGERY — TOTAL HIP REVISION
Anesthesia: General | Site: Hip | Laterality: Left | Wound class: Clean

## 2012-11-05 MED ORDER — MEPERIDINE HCL 50 MG/ML IJ SOLN: 6.2500 mg | INTRAMUSCULAR | Status: DC | PRN

## 2012-11-05 MED ORDER — NEOSTIGMINE METHYLSULFATE 1 MG/ML IJ SOLN
INTRAMUSCULAR | Status: DC | PRN
  Administered 2012-11-05: 4 mg via INTRAVENOUS

## 2012-11-05 MED ORDER — 0.9 % SODIUM CHLORIDE (POUR BTL) OPTIME
TOPICAL | Status: DC | PRN
  Administered 2012-11-05: 1000 mL

## 2012-11-05 MED ORDER — LIDOCAINE HCL (CARDIAC) 20 MG/ML IV SOLN
INTRAVENOUS | Status: DC | PRN
  Administered 2012-11-05: 80 mg via INTRAVENOUS

## 2012-11-05 MED ORDER — PROPOFOL 10 MG/ML IV BOLUS
INTRAVENOUS | Status: DC | PRN
  Administered 2012-11-05: 140 mg via INTRAVENOUS

## 2012-11-05 MED ORDER — SODIUM CHLORIDE 0.9 % IV SOLN
INTRAVENOUS | Status: DC
  Administered 2012-11-05: 1000 mL via INTRAVENOUS
  Administered 2012-11-06 – 2012-11-07 (×2): via INTRAVENOUS

## 2012-11-05 MED ORDER — LACTATED RINGERS IV SOLN
INTRAVENOUS | Status: DC
  Administered 2012-11-05: 1000 mL via INTRAVENOUS

## 2012-11-05 MED ORDER — ONDANSETRON HCL 4 MG/2ML IJ SOLN
INTRAMUSCULAR | Status: DC | PRN
  Administered 2012-11-05: 4 mg via INTRAVENOUS

## 2012-11-05 MED ORDER — INSULIN ASPART 100 UNIT/ML ~~LOC~~ SOLN
0.0000 [IU] | Freq: Three times a day (TID) | SUBCUTANEOUS | Status: DC
  Administered 2012-11-06: 5 [IU] via SUBCUTANEOUS
  Administered 2012-11-06: 3 [IU] via SUBCUTANEOUS
  Administered 2012-11-06: 2 [IU] via SUBCUTANEOUS
  Administered 2012-11-07 (×2): 3 [IU] via SUBCUTANEOUS
  Administered 2012-11-08: 5 [IU] via SUBCUTANEOUS

## 2012-11-05 MED ORDER — ACETAMINOPHEN 325 MG PO TABS: 650.0000 mg | ORAL_TABLET | Freq: Four times a day (QID) | ORAL | Status: DC | PRN

## 2012-11-05 MED ORDER — PROMETHAZINE HCL 25 MG/ML IJ SOLN: 6.2500 mg | INTRAMUSCULAR | Status: DC | PRN

## 2012-11-05 MED ORDER — METOCLOPRAMIDE HCL 10 MG PO TABS: 5.0000 mg | ORAL_TABLET | Freq: Three times a day (TID) | ORAL | Status: DC | PRN

## 2012-11-05 MED ORDER — ACETAMINOPHEN 10 MG/ML IV SOLN
1000.0000 mg | Freq: Four times a day (QID) | INTRAVENOUS | Status: AC
  Administered 2012-11-05 – 2012-11-06 (×4): 1000 mg via INTRAVENOUS
  Filled 2012-11-05 (×7): qty 100

## 2012-11-05 MED ORDER — STERILE WATER FOR IRRIGATION IR SOLN
Status: DC | PRN
  Administered 2012-11-05: 3000 mL

## 2012-11-05 MED ORDER — HYDROMORPHONE HCL PF 1 MG/ML IJ SOLN
INTRAMUSCULAR | Status: DC | PRN
  Administered 2012-11-05 (×2): 0.5 mg via INTRAVENOUS

## 2012-11-05 MED ORDER — CYCLOBENZAPRINE HCL 10 MG PO TABS
10.0000 mg | ORAL_TABLET | Freq: Three times a day (TID) | ORAL | Status: DC | PRN
  Administered 2012-11-05 – 2012-11-07 (×4): 10 mg via ORAL
  Filled 2012-11-05 (×4): qty 1

## 2012-11-05 MED ORDER — FENTANYL CITRATE 0.05 MG/ML IJ SOLN
INTRAMUSCULAR | Status: DC | PRN
  Administered 2012-11-05: 50 ug via INTRAVENOUS
  Administered 2012-11-05: 100 ug via INTRAVENOUS
  Administered 2012-11-05 (×2): 50 ug via INTRAVENOUS

## 2012-11-05 MED ORDER — VANCOMYCIN HCL IN DEXTROSE 1-5 GM/200ML-% IV SOLN
1000.0000 mg | Freq: Two times a day (BID) | INTRAVENOUS | Status: AC
  Administered 2012-11-06: 1000 mg via INTRAVENOUS
  Filled 2012-11-05: qty 200

## 2012-11-05 MED ORDER — GABAPENTIN 300 MG PO CAPS
300.0000 mg | ORAL_CAPSULE | Freq: Four times a day (QID) | ORAL | Status: DC
  Administered 2012-11-05 – 2012-11-08 (×10): 300 mg via ORAL
  Filled 2012-11-05 (×13): qty 1

## 2012-11-05 MED ORDER — FENTANYL CITRATE 0.05 MG/ML IJ SOLN
25.0000 ug | INTRAMUSCULAR | Status: AC | PRN
  Administered 2012-11-05: 50 ug via INTRAVENOUS
  Administered 2012-11-05 (×3): 25 ug via INTRAVENOUS
  Administered 2012-11-05 (×3): 50 ug via INTRAVENOUS

## 2012-11-05 MED ORDER — BUPIVACAINE LIPOSOME 1.3 % IJ SUSP
20.0000 mL | Freq: Once | INTRAMUSCULAR | Status: DC
  Filled 2012-11-05: qty 20

## 2012-11-05 MED ORDER — LOSARTAN POTASSIUM 50 MG PO TABS
50.0000 mg | ORAL_TABLET | Freq: Every morning | ORAL | Status: DC
  Administered 2012-11-06 – 2012-11-08 (×3): 50 mg via ORAL
  Filled 2012-11-05 (×3): qty 1

## 2012-11-05 MED ORDER — POLYETHYLENE GLYCOL 3350 17 GM/SCOOP PO POWD
17.0000 g | Freq: Every day | ORAL | Status: DC
  Administered 2012-11-08: 17 g via ORAL
  Filled 2012-11-05: qty 255

## 2012-11-05 MED ORDER — ACETAMINOPHEN 650 MG RE SUPP: 650.0000 mg | Freq: Four times a day (QID) | RECTAL | Status: DC | PRN

## 2012-11-05 MED ORDER — FINASTERIDE 5 MG PO TABS
5.0000 mg | ORAL_TABLET | Freq: Every day | ORAL | Status: DC
  Administered 2012-11-05 – 2012-11-08 (×4): 5 mg via ORAL
  Filled 2012-11-05 (×4): qty 1

## 2012-11-05 MED ORDER — POLYETHYLENE GLYCOL 3350 17 G PO PACK
17.0000 g | PACK | Freq: Every day | ORAL | Status: DC | PRN
  Administered 2012-11-06 – 2012-11-07 (×3): 17 g via ORAL

## 2012-11-05 MED ORDER — MENTHOL 3 MG MT LOZG: 1.0000 | LOZENGE | OROMUCOSAL | Status: DC | PRN

## 2012-11-05 MED ORDER — RIVAROXABAN 10 MG PO TABS
10.0000 mg | ORAL_TABLET | Freq: Every day | ORAL | Status: DC
  Administered 2012-11-06 – 2012-11-08 (×3): 10 mg via ORAL
  Filled 2012-11-05 (×4): qty 1

## 2012-11-05 MED ORDER — ONDANSETRON HCL 4 MG PO TABS: 4.0000 mg | ORAL_TABLET | Freq: Four times a day (QID) | ORAL | Status: DC | PRN

## 2012-11-05 MED ORDER — SIMVASTATIN 40 MG PO TABS
40.0000 mg | ORAL_TABLET | Freq: Every evening | ORAL | Status: DC
  Administered 2012-11-05 – 2012-11-07 (×3): 40 mg via ORAL
  Filled 2012-11-05 (×4): qty 1

## 2012-11-05 MED ORDER — ONDANSETRON HCL 4 MG/2ML IJ SOLN: 4.0000 mg | Freq: Four times a day (QID) | INTRAMUSCULAR | Status: DC | PRN

## 2012-11-05 MED ORDER — ROCURONIUM BROMIDE 100 MG/10ML IV SOLN
INTRAVENOUS | Status: DC | PRN
  Administered 2012-11-05: 35 mg via INTRAVENOUS

## 2012-11-05 MED ORDER — GLIPIZIDE 10 MG PO TABS
10.0000 mg | ORAL_TABLET | Freq: Two times a day (BID) | ORAL | Status: DC
  Administered 2012-11-06 – 2012-11-08 (×5): 10 mg via ORAL
  Filled 2012-11-05 (×7): qty 1

## 2012-11-05 MED ORDER — METFORMIN HCL 500 MG PO TABS
500.0000 mg | ORAL_TABLET | Freq: Two times a day (BID) | ORAL | Status: DC
  Administered 2012-11-06 – 2012-11-08 (×5): 500 mg via ORAL
  Filled 2012-11-05 (×7): qty 1

## 2012-11-05 MED ORDER — FENTANYL CITRATE 0.05 MG/ML IJ SOLN: 25.0000 ug | INTRAMUSCULAR | Status: DC | PRN

## 2012-11-05 MED ORDER — CHLORHEXIDINE GLUCONATE 4 % EX LIQD: 60.0000 mL | Freq: Once | CUTANEOUS | Status: DC

## 2012-11-05 MED ORDER — ACETAMINOPHEN 10 MG/ML IV SOLN
1000.0000 mg | Freq: Once | INTRAVENOUS | Status: AC
  Administered 2012-11-05: 1000 mg via INTRAVENOUS

## 2012-11-05 MED ORDER — BISACODYL 10 MG RE SUPP: 10.0000 mg | Freq: Every day | RECTAL | Status: DC | PRN

## 2012-11-05 MED ORDER — TRAMADOL HCL 50 MG PO TABS
50.0000 mg | ORAL_TABLET | Freq: Four times a day (QID) | ORAL | Status: DC | PRN
  Administered 2012-11-05 – 2012-11-08 (×7): 100 mg via ORAL
  Filled 2012-11-05 (×7): qty 2

## 2012-11-05 MED ORDER — DOCUSATE SODIUM 100 MG PO CAPS
100.0000 mg | ORAL_CAPSULE | Freq: Two times a day (BID) | ORAL | Status: DC
  Administered 2012-11-05 – 2012-11-06 (×3): 100 mg via ORAL

## 2012-11-05 MED ORDER — METHOCARBAMOL 500 MG PO TABS
500.0000 mg | ORAL_TABLET | Freq: Four times a day (QID) | ORAL | Status: DC | PRN
  Administered 2012-11-06 (×3): 500 mg via ORAL
  Filled 2012-11-05 (×3): qty 1

## 2012-11-05 MED ORDER — METHOCARBAMOL 100 MG/ML IJ SOLN
500.0000 mg | Freq: Four times a day (QID) | INTRAVENOUS | Status: DC | PRN
  Administered 2012-11-05: 500 mg via INTRAVENOUS
  Filled 2012-11-05: qty 5

## 2012-11-05 MED ORDER — TRANEXAMIC ACID 100 MG/ML IV SOLN
1000.0000 mg | INTRAVENOUS | Status: AC
  Administered 2012-11-05: 1000 mg via INTRAVENOUS
  Filled 2012-11-05: qty 10

## 2012-11-05 MED ORDER — SODIUM CHLORIDE 0.9 % IV SOLN: INTRAVENOUS | Status: DC

## 2012-11-05 MED ORDER — METOCLOPRAMIDE HCL 5 MG/ML IJ SOLN: 5.0000 mg | Freq: Three times a day (TID) | INTRAMUSCULAR | Status: DC | PRN

## 2012-11-05 MED ORDER — DEXAMETHASONE 6 MG PO TABS
10.0000 mg | ORAL_TABLET | Freq: Every day | ORAL | Status: AC
  Administered 2012-11-06: 10 mg via ORAL
  Filled 2012-11-05: qty 1

## 2012-11-05 MED ORDER — SODIUM CHLORIDE 0.9 % IJ SOLN
INTRAMUSCULAR | Status: DC | PRN
  Administered 2012-11-05: 17:00:00

## 2012-11-05 MED ORDER — DIPHENHYDRAMINE HCL 12.5 MG/5ML PO ELIX: 12.5000 mg | ORAL_SOLUTION | ORAL | Status: DC | PRN

## 2012-11-05 MED ORDER — MORPHINE SULFATE 2 MG/ML IJ SOLN: 1.0000 mg | INTRAMUSCULAR | Status: DC | PRN

## 2012-11-05 MED ORDER — OXYCODONE HCL 5 MG PO TABS
5.0000 mg | ORAL_TABLET | ORAL | Status: DC | PRN
  Administered 2012-11-06 – 2012-11-07 (×2): 10 mg via ORAL
  Administered 2012-11-08: 5 mg via ORAL
  Filled 2012-11-05: qty 1
  Filled 2012-11-05 (×2): qty 2

## 2012-11-05 MED ORDER — DEXAMETHASONE SODIUM PHOSPHATE 10 MG/ML IJ SOLN: 10.0000 mg | Freq: Every day | INTRAMUSCULAR | Status: AC

## 2012-11-05 MED ORDER — GLYCOPYRROLATE 0.2 MG/ML IJ SOLN
INTRAMUSCULAR | Status: DC | PRN
  Administered 2012-11-05: .6 mg via INTRAVENOUS

## 2012-11-05 MED ORDER — FLEET ENEMA 7-19 GM/118ML RE ENEM: 1.0000 | ENEMA | Freq: Once | RECTAL | Status: AC | PRN

## 2012-11-05 MED ORDER — PHENOL 1.4 % MT LIQD: 1.0000 | OROMUCOSAL | Status: DC | PRN

## 2012-11-05 SURGICAL SUPPLY — 60 items
ARTICULEZE HEAD 36 12 (Hips) ×2 IMPLANT
BAG ZIPLOCK 12X15 (MISCELLANEOUS) ×6 IMPLANT
BIT DRILL 2.8X128 (BIT) ×2 IMPLANT
BLADE EXTENDED COATED 6.5IN (ELECTRODE) ×2 IMPLANT
BLADE SAW SAG 73X25 THK (BLADE) ×1
BLADE SAW SGTL 73X25 THK (BLADE) ×1 IMPLANT
CLOTH BEACON ORANGE TIMEOUT ST (SAFETY) ×2 IMPLANT
CONT SPECI 4OZ STER CLIK (MISCELLANEOUS) IMPLANT
CUP SECTOR GRIPTON 58MM (Orthopedic Implant) ×2 IMPLANT
DRAPE INCISE IOBAN 66X45 STRL (DRAPES) ×2 IMPLANT
DRAPE ORTHO SPLIT 77X108 STRL (DRAPES) ×2
DRAPE POUCH INSTRU U-SHP 10X18 (DRAPES) ×2 IMPLANT
DRAPE SURG ORHT 6 SPLT 77X108 (DRAPES) ×2 IMPLANT
DRAPE U-SHAPE 47X51 STRL (DRAPES) ×2 IMPLANT
DRSG EMULSION OIL 3X16 NADH (GAUZE/BANDAGES/DRESSINGS) ×2 IMPLANT
DRSG MEPILEX BORDER 4X4 (GAUZE/BANDAGES/DRESSINGS) ×2 IMPLANT
DRSG MEPILEX BORDER 4X8 (GAUZE/BANDAGES/DRESSINGS) ×2 IMPLANT
DURAPREP 26ML APPLICATOR (WOUND CARE) ×2 IMPLANT
ELECT REM PT RETURN 9FT ADLT (ELECTROSURGICAL) ×2
ELECTRODE REM PT RTRN 9FT ADLT (ELECTROSURGICAL) ×1 IMPLANT
EVACUATOR 1/8 PVC DRAIN (DRAIN) ×2 IMPLANT
FACESHIELD LNG OPTICON STERILE (SAFETY) ×8 IMPLANT
GLOVE BIO SURGEON STRL SZ7.5 (GLOVE) ×2 IMPLANT
GLOVE BIO SURGEON STRL SZ8 (GLOVE) ×2 IMPLANT
GLOVE BIOGEL PI IND STRL 8 (GLOVE) ×2 IMPLANT
GLOVE BIOGEL PI INDICATOR 8 (GLOVE) ×2
GLOVE SURG SS PI 6.5 STRL IVOR (GLOVE) ×4 IMPLANT
GOWN STRL NON-REIN LRG LVL3 (GOWN DISPOSABLE) ×4 IMPLANT
GOWN STRL REIN XL XLG (GOWN DISPOSABLE) ×2 IMPLANT
HEAD ARTICULEZE 36 12 (Hips) ×1 IMPLANT
IMMOBILIZER KNEE 20 (SOFTGOODS)
IMMOBILIZER KNEE 20 THIGH 36 (SOFTGOODS) IMPLANT
KIT BASIN OR (CUSTOM PROCEDURE TRAY) ×2 IMPLANT
LINER MARATHON NEUT +4X58X36 (Hips) ×2 IMPLANT
MANIFOLD NEPTUNE II (INSTRUMENTS) ×2 IMPLANT
NDL SAFETY ECLIPSE 18X1.5 (NEEDLE) IMPLANT
NEEDLE HYPO 18GX1.5 SHARP (NEEDLE)
NS IRRIG 1000ML POUR BTL (IV SOLUTION) ×2 IMPLANT
PACK TOTAL JOINT (CUSTOM PROCEDURE TRAY) ×2 IMPLANT
PASSER SUT SWANSON 36MM LOOP (INSTRUMENTS) ×2 IMPLANT
POSITIONER SURGICAL ARM (MISCELLANEOUS) ×2 IMPLANT
SCREW 6.5MMX25MM (Screw) ×2 IMPLANT
SCREW 6.5MMX30MM (Screw) ×2 IMPLANT
SPONGE GAUZE 4X4 12PLY (GAUZE/BANDAGES/DRESSINGS) ×2 IMPLANT
SPONGE LAP 18X18 X RAY DECT (DISPOSABLE) ×2 IMPLANT
STAPLER VISISTAT 35W (STAPLE) ×2 IMPLANT
STRIP CLOSURE SKIN 1/2X4 (GAUZE/BANDAGES/DRESSINGS) ×2 IMPLANT
SUCTION FRAZIER TIP 10 FR DISP (SUCTIONS) ×2 IMPLANT
SUT ETHIBOND NAB CT1 #1 30IN (SUTURE) ×4 IMPLANT
SUT VIC AB 1 CT1 27 (SUTURE) ×1
SUT VIC AB 1 CT1 27XBRD ANTBC (SUTURE) ×1 IMPLANT
SUT VIC AB 2-0 CT1 27 (SUTURE) ×2
SUT VIC AB 2-0 CT1 TAPERPNT 27 (SUTURE) ×2 IMPLANT
SUT VLOC 180 0 24IN GS25 (SUTURE) ×2 IMPLANT
SWAB COLLECTION DEVICE MRSA (MISCELLANEOUS) IMPLANT
SYR 50ML LL SCALE MARK (SYRINGE) IMPLANT
TOWEL OR 17X26 10 PK STRL BLUE (TOWEL DISPOSABLE) ×4 IMPLANT
TRAY FOLEY CATH 14FRSI W/METER (CATHETERS) ×2 IMPLANT
TUBE ANAEROBIC SPECIMEN COL (MISCELLANEOUS) IMPLANT
WATER STERILE IRR 1500ML POUR (IV SOLUTION) ×4 IMPLANT

## 2012-11-05 NOTE — Anesthesia Postprocedure Evaluation (Signed)
  Anesthesia Post-op Note  Patient: Andrew Norton  Procedure(s) Performed: Procedure(s) (LRB): Conversion of a Bipolar to a Left Total Hip Arthroplasty (Left)  Patient Location: PACU  Anesthesia Type: General  Level of Consciousness: awake and alert   Airway and Oxygen Therapy: Patient Spontanous Breathing  Post-op Pain: mild  Post-op Assessment: Post-op Vital signs reviewed, Patient's Cardiovascular Status Stable, Respiratory Function Stable, Patent Airway and No signs of Nausea or vomiting  Last Vitals:  Filed Vitals:   11/05/12 1815  BP: 124/47  Pulse: 78  Temp: 36.2 C  Resp: 18    Post-op Vital Signs: stable   Complications: No apparent anesthesia complications

## 2012-11-05 NOTE — Preoperative (Signed)
Beta Blockers   Reason not to administer Beta Blockers:Not Applicable 

## 2012-11-05 NOTE — Brief Op Note (Signed)
11/05/2012  4:48 PM  PATIENT:  Drucie Ip  79 y.o. male  PRE-OPERATIVE DIAGNOSIS:  failed left hip bipolar hemi arthroplasty  POST-OPERATIVE DIAGNOSIS:  failed left hip bipolar hemi arthroplasty  PROCEDURE:  Procedure(s) with comments: Conversion of a Bipolar to a Left Total Hip Arthroplasty (Left) - Conversion of a Bipolar to a Left Total Hip Arthroplasty  SURGEON:  Surgeon(s) and Role:    * Loanne Drilling, MD - Primary  PHYSICIAN ASSISTANT:   ASSISTANTS: Leilani Able, PA-C   ANESTHESIA:   general  EBL:  Total I/O In: 1000 [I.V.:1000] Out: 1050 [Urine:625; Blood:425]  BLOOD ADMINISTERED:none  DRAINS: (Medium) Hemovact drain(s) in the left hip with  Suction Open   LOCAL MEDICATIONS USED:  OTHER Exparel  COUNTS:  YES  TOURNIQUET:  * No tourniquets in log *  DICTATION: .Other Dictation: Dictation Number [id number]  PLAN OF CARE: Admit to inpatient   PATIENT DISPOSITION:  PACU - hemodynamically stable.

## 2012-11-05 NOTE — Interval H&P Note (Signed)
History and Physical Interval Note:  11/05/2012 2:43 PM  Andrew Norton  has presented today for surgery, with the diagnosis of failed left hip bipolar hemi arthroplasty  The various methods of treatment have been discussed with the patient and family. After consideration of risks, benefits and other options for treatment, the patient has consented to  Procedure(s) with comments: Conversion of a Bipolar to a Left Total Hip Arthroplasty (Left) - Conversion of a Bipolar to a Left Total Hip Arthroplasty as a surgical intervention .  The patient's history has been reviewed, patient examined, no change in status, stable for surgery.  I have reviewed the patient's chart and labs.  Questions were answered to the patient's satisfaction.     Loanne Drilling

## 2012-11-05 NOTE — Transfer of Care (Signed)
Immediate Anesthesia Transfer of Care Note  Patient: Andrew Norton  Procedure(s) Performed: Procedure(s) with comments: Conversion of a Bipolar to a Left Total Hip Arthroplasty (Left) - Conversion of a Bipolar to a Left Total Hip Arthroplasty  Patient Location: 2PACU  Anesthesia Type:General  Level of Consciousness: awake, alert , oriented and patient cooperative  Airway & Oxygen Therapy: Patient Spontanous Breathing and Patient connected to face mask oxygen  Post-op Assessment: Report given to PACU RN, Post -op Vital signs reviewed and stable and Patient moving all extremities  Post vital signs: Reviewed and stable  Complications: No apparent anesthesia complications

## 2012-11-05 NOTE — Anesthesia Preprocedure Evaluation (Addendum)
Anesthesia Evaluation  Patient identified by MRN, date of birth, ID band Patient awake    Reviewed: Allergy & Precautions, H&P , NPO status , Patient's Chart, lab work & pertinent test results  History of Anesthesia Complications (+) AWARENESS UNDER ANESTHESIA  Airway Mallampati: II TM Distance: >3 FB Neck ROM: full    Dental no notable dental hx.    Pulmonary neg pulmonary ROS,  breath sounds clear to auscultation  Pulmonary exam normal       Cardiovascular hypertension, Pt. 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  Also cervical fusion. negative psych ROS   GI/Hepatic negative GI ROS, Neg liver ROS,   Endo/Other  diabetes, Oral Hypoglycemic Agents  Renal/GU Renal InsufficiencyRenal disease  negative genitourinary   Musculoskeletal   Abdominal   Peds  Hematology negative hematology ROS (+)   Anesthesia Other Findings   Reproductive/Obstetrics negative OB ROS                          Anesthesia Physical  Anesthesia Plan  ASA: III  Anesthesia Plan: General   Post-op Pain Management:    Induction: Intravenous  Airway Management Planned: Oral ETT  Additional Equipment:   Intra-op Plan:   Post-operative Plan:   Informed Consent: I have reviewed the patients History and Physical, chart, labs and discussed the procedure including the risks, benefits and alternatives for the proposed anesthesia with the patient or authorized representative who has indicated his/her understanding and acceptance.   Dental Advisory Given  Plan Discussed with: CRNA  Anesthesia Plan Comments:         Anesthesia Quick Evaluation

## 2012-11-06 ENCOUNTER — Encounter (HOSPITAL_COMMUNITY): Payer: Self-pay | Admitting: Orthopedic Surgery

## 2012-11-06 LAB — CBC
HCT: 26.3 % — ABNORMAL LOW (ref 39.0–52.0)
Platelets: 176 10*3/uL (ref 150–400)
RBC: 2.98 MIL/uL — ABNORMAL LOW (ref 4.22–5.81)
RDW: 13 % (ref 11.5–15.5)
WBC: 6.2 10*3/uL (ref 4.0–10.5)

## 2012-11-06 LAB — BASIC METABOLIC PANEL
CO2: 28 mEq/L (ref 19–32)
Chloride: 102 mEq/L (ref 96–112)
GFR calc Af Amer: >90 mL/min (ref >90)
Potassium: 4.6 mEq/L (ref 3.5–5.1)

## 2012-11-06 LAB — GLUCOSE, CAPILLARY
Glucose-Capillary: 136 mg/dL — ABNORMAL HIGH (ref 70–99)
Glucose-Capillary: 210 mg/dL — ABNORMAL HIGH (ref 70–99)
Glucose-Capillary: 237 mg/dL — ABNORMAL HIGH (ref 70–99)

## 2012-11-06 MED ORDER — TRAMADOL HCL 50 MG PO TABS: 50.0000 mg | ORAL_TABLET | Freq: Four times a day (QID) | ORAL | Status: DC | PRN

## 2012-11-06 MED ORDER — BISACODYL 10 MG RE SUPP: 10.0000 mg | Freq: Every day | RECTAL | Status: DC | PRN

## 2012-11-06 MED ORDER — RIVAROXABAN 10 MG PO TABS: 10.0000 mg | ORAL_TABLET | Freq: Every day | ORAL | Status: DC

## 2012-11-06 MED ORDER — METHOCARBAMOL 500 MG PO TABS: 500.0000 mg | ORAL_TABLET | Freq: Four times a day (QID) | ORAL | Status: DC | PRN

## 2012-11-06 MED ORDER — OXYCODONE HCL 5 MG PO TABS: 5.0000 mg | ORAL_TABLET | ORAL | Status: DC | PRN

## 2012-11-06 NOTE — Evaluation (Signed)
Physical Therapy Evaluation Patient Details Name: Andrew Norton MRN: [id number] DOB: 05-06-1927 Today's Date: 11/06/2012 Time: 1209-1230 PT Time Calculation (min): 21 min  PT Assessment / Plan / Recommendation Clinical Impression  79 yo male admitted 4/23/14for conversion of ORIF to THA-posterior. Pt. has h/o peripheral neuropathy. Pt. plans for snf rehab. Pt. will benefit from PT while in acute care to improve function.  Pt. Did ambulate 6 ft.    PT Assessment  Patient needs continued PT services    Follow Up Recommendations  SNF    Does the patient have the potential to tolerate intense rehabilitation      Barriers to Discharge        Equipment Recommendations  None recommended by PT    Recommendations for Other Services     Frequency 7X/week    Precautions / Restrictions Precautions Precautions: Posterior Hip Precaution Booklet Issued: Yes (comment)   Pertinent Vitals/Pain Reports L hip  Is painful, has had meds, ice applied.      Mobility  Bed Mobility Bed Mobility: Supine to Sit;Sitting - Scoot to Edge of Bed Supine to Sit: HOB elevated;With rails;2: Max assist;1: +2 Total assist Supine to Sit: Patient Percentage: 40% Sitting - Scoot to Edge of Bed: 2: Max assist Details for Bed Mobility Assistance: cues for posterior hip precautions, for turning body to side to optimize sitting up. Transfers Transfers: Sit to Stand;Stand to Sit Sit to Stand: 1: +2 Total assist;From elevated surface;With upper extremity assist;From bed Sit to Stand: Patient Percentage: 60% Stand to Sit: To chair/3-in-1;With armrests;1: +2 Total assist Stand to Sit: Patient Percentage: 60% Details for Transfer Assistance: cues for hand and LLE placement, posterior precautions. Ambulation/Gait Ambulation/Gait Assistance: 1: +2 Total assist Ambulation/Gait: Patient Percentage: 60% Ambulation Distance (Feet): 6 Feet Assistive device: Rolling walker Ambulation/Gait Assistance Details: cues for  sequence, manual assistance to advace LLE  Gait Pattern: Step-to pattern;Trunk flexed;Decreased step length - left;Decreased stance time - left    Exercises     PT Diagnosis: Difficulty walking;Generalized weakness;Acute pain  PT Problem List: Decreased strength;Decreased range of motion;Decreased activity tolerance;Decreased mobility;Decreased knowledge of use of DME;Decreased safety awareness;Decreased knowledge of precautions;Impaired sensation;Pain PT Treatment Interventions: DME instruction;Gait training;Functional mobility training;Therapeutic activities;Therapeutic exercise;Patient/family education   PT Goals Acute Rehab PT Goals PT Goal Formulation: With patient/family Time For Goal Achievement: 11/20/12 Potential to Achieve Goals: Good Pt will go Supine/Side to Sit: with min assist PT Goal: Supine/Side to Sit - Progress: Goal set today Pt will go Sit to Supine/Side: with min assist PT Goal: Sit to Supine/Side - Progress: Goal set today Pt will go Sit to Stand: with min assist PT Goal: Sit to Stand - Progress: Goal set today Pt will go Stand to Sit: with min assist PT Goal: Stand to Sit - Progress: Goal set today Pt will Ambulate: 51 - 150 feet;with min assist;with rolling walker PT Goal: Ambulate - Progress: Goal set today Pt will Perform Home Exercise Program: with min assist PT Goal: Perform Home Exercise Program - Progress: Goal set today  Visit Information  Last PT Received On: 11/06/12 Assistance Needed: +2    Subjective Data  Subjective: what are we going to do Patient Stated Goal:  to go to rehab.   Prior Functioning  Home Living Lives With: Spouse Available Help at Discharge: Family Type of Home: Skilled Nursing Facility Home Adaptive Equipment: Straight cane;Walker - rolling;Bedside commode/3-in-1 Prior Function Level of Independence: Independent with assistive device(s) Communication Communication: No difficulties    Cognition  Cognition Arousal/Alertness: Awake/alert Behavior During Therapy: WFL for tasks assessed/performed Overall Cognitive Status: Within Functional Limits for tasks assessed    Extremity/Trunk Assessment Right Lower Extremity Assessment RLE ROM/Strength/Tone: WFL for tasks assessed RLE Sensation: History of peripheral neuropathy Left Lower Extremity Assessment LLE ROM/Strength/Tone: Deficits LLE ROM/Strength/Tone Deficits: L hip is internally rotated. LLE Sensation: History of peripheral neuropathy   Balance Balance Balance Assessed: Yes Static Sitting Balance Static Sitting - Balance Support: Bilateral upper extremity supported Static Sitting - Level of Assistance: 5: Stand by assistance  End of Session PT - End of Session Activity Tolerance: Patient limited by fatigue;Patient limited by pain Patient left: in chair;with call bell/phone within reach;with family/visitor present Nurse Communication: Mobility status  GP     Rada Hay 11/06/2012, 3:13 PM  Blanchard Kelch PT [phone]

## 2012-11-06 NOTE — Op Note (Signed)
NAMEHARLIE, RAGLE NO.:  [id number]  MEDICAL RECORD NO.:  [id number]  LOCATION:  1620                         FACILITY:  [facility]  PHYSICIAN:  Ollen Gross, M.D.    DATE OF BIRTH:  03-30-1927  DATE OF PROCEDURE:  11/05/2012 DATE OF DISCHARGE:                              OPERATIVE REPORT   PREOPERATIVE DIAGNOSIS:  Failed left hip bipolar hemiarthroplasty.  POSTOPERATIVE DIAGNOSIS:  Failed left hip bipolar hemiarthroplasty.  PROCEDURE:  Conversion of previous hip surgery, left total hip arthroplasty.  SURGEON:  Ollen Gross, MD  ASSISTANT:  Jaquelyn Bitter. Chabon, P.A.  ANESTHESIA:  General.  ESTIMATED BLOOD LOSS:  425.  DRAINS:  Hemovac x1.  COMPLICATIONS:  None.  CONDITION:  Stable to recovery.  BRIEF CLINICAL NOTE:  Mr. Olander is an 79 year old male, who had a fractured left femoral neck approximately 2 years ago, treated with bipolar hemiarthroplasty by Dr. Ranell Patrick.  He has had persistently worsening groin pain on the left.  Exam and history suggest that his pain is coming from the acetabulum.  His x-ray showed that he has bipolar wear through the acetabular cartilage to where he has metal against bone.  Also has large spurs overlying the superior aspect of bipolar, which would be impinging.  He had a bone scan, which showed increased uptake in acetabulum consistent with degenerative change.  He presents now for conversion of the previous hip surgery to total hip arthroplasty.  PROCEDURE IN DETAIL:  After successful administration of general anesthetic, the patient was placed in the right lateral decubitus position with the left side up, and held with the hip positioner.  The left lower extremity was isolated from his perineum with plastic drapes and prepped and draped in the usual sterile fashion.  A posterolateral incision was made with a 10 blade through subcutaneous tissue to the fascia lata, which was incised in line with the skin incision.   Sciatic nerve was palpated and protected.  The hip joint could not be visualized because this was a 3 x 4 cm piece of heterotopic bone versus osteophyte off of the supra posterior acetabulum.  I removed this and then was able to gain access to the joint.  The bipolar component is dislocated and then the femoral head and bipolar removed.  We then gained circumferential acetabular exposure by retracting the femur anteriorly and placing the acetabular retractors.  The cartilage in the acetabulum was completely gone.  There is a large, superior cyst.  In addition, there is a lot of soft tissue metaplasia that occured along the acetabular surface and when that was removed, there was all exposed bone.  This is a 55 bipolar component.  I started reaming it 53 centrally and then reamed up to 57 mm for placement of a 58-mm pinnacle acetabular shell.  Shell was placed in anatomic position with outstanding purchase.  I placed 2 additional dome screws to transfix the cup.  Prior to placing the cup, I curetted out the contents of the superior acetabular cyst and packed that with the cancellous reamings. I was very happy with the position of the cup.  A 36-mm neutral +4 marathon liner was  then placed in the acetabular shell.  We then placed the trial heads on the femoral stem.  It was a Tri-Lock stem that was in excellent position, well fixed.  I started with 36+ 5, but that reduced too easily.  We used a 36+ 12 head, which had excellent stable reduction.  He has good stability with full extension, full external rotation, 70 degrees flexion, 40 degrees adduction, about 50 degrees internal rotation, and 90 degrees flexion, and 50 degrees internal rotation.  By placing the left leg on top of the right, the lengths were equal.  The hip was then dislocated.  Trial head was removed and the permanent 36+ 12 metal femoral head was placed.  The wound was copiously irrigated with saline solution and then the  posterior soft tissues reattached to the femur through drill holes with Ethibond suture.  This was a very stable pseudocapsule repair.  The fascia lata was then closed with Hemovac drain.  Prior to closing this, we placed 20 mL of Exparel with 50 mL of saline into the fascia lata, gluteal muscles, and subcu tissues.  The fascia lata was then closed with a running #1 V-Loc suture.  Subcu was closed with interrupted 2-0 Vicryl, and subcuticular running 4-0 Monocryl.  The drain was hooked to suction.  Incision cleaned and dried and Steri-Strips and a bulky sterile dressing applied. He was then placed into a knee immobilizer, awakened, and transported to recovery in stable condition.  Please note that a surgical assistant was a medical necessity for this procedure in order to perform it in a safe and expeditious manner. Surgical assistant was necessary for retraction of vital neurovascular structures, retraction of the implant and femur to prevent fracture of the femur, well placed in the acetabular component and for proper exposure of the acetabulum to prevent eccentric reaming or acetabular fracture.     Ollen Gross, M.D.     FA/MEDQ  D:  11/05/2012  T:  11/06/2012  Job:  [id number]

## 2012-11-06 NOTE — Progress Notes (Signed)
OT Cancellation Note  Patient Details Name: Andrew Norton MRN: [id number] DOB: [birth year]   Cancelled Treatment:    Reason Eval/Treat Not Completed:   Met with pt and wife.  Educated on the scope of OT.  Pt stating he prefers the focus of acute therapy to be on early mobility. Defer OT to SNF.  Evern Bio 11/06/2012, 1:31 PM

## 2012-11-06 NOTE — Progress Notes (Signed)
Clinical Social Work Department BRIEF PSYCHOSOCIAL ASSESSMENT 11/06/2012  Patient:  Andrew Norton, Andrew Norton     Account Number:  [id number]     Admit date:  11/05/2012  Clinical Social Worker:  Jacelyn Grip  Date/Time:  11/06/2012 11:00 AM  Referred by:  Physician  Date Referred:  11/06/2012 Referred for  SNF Placement   Other Referral:   Interview type:  Patient Other interview type:    PSYCHOSOCIAL DATA Living Status:  WIFE Admitted from facility:   Level of care:   Primary support name:  Andrew Norton/[phone] Primary support relationship to patient:  SPOUSE Degree of support available:   adequate    CURRENT CONCERNS Current Concerns  Post-Acute Placement   Other Concerns:    SOCIAL WORK ASSESSMENT / PLAN CSW received referral for New SNF.    CSW met with pt at bedside to discuss. Pt agreeable to SNF for rehab and hopeful for [facility] and [facility].    CSW spoke with [facility] who feels that facility can meet pt medical needs, but facility does not have any bed availability at this time and will keep CSW updated on if bed becomes available.    CSW discussed with pt and pt is hopeful that [facility] will have bed become available, but agreeable to [facility] search to have secondary options available.    CSW completed FL2 and initiated SNF search to [facility].    CSW to follow up with pt re: bed offers and update about [facility].    CSW to continue to follow and facilitate pt discharge needs when pt medically ready for discharge.   Assessment/plan status:  Psychosocial Support/Ongoing Assessment of Needs Other assessment/ plan:   discharge planning   Information/referral to community resources:   [facility] list    PATIENT'S/FAMILY'S RESPONSE TO PLAN OF CARE: Pt alert and oriented x 4. Pt is very hopeful that a bed will become available at [facility] as that is pt ideal choice for SNF, but  agreeable to SNF search in [facility] to have other options in case [facility] does not have bed to become available.     Jacklynn Lewis, MSW, LCSWA  Clinical Social Work [phone]

## 2012-11-06 NOTE — Progress Notes (Signed)
Physical Therapy Treatment Patient Details Name: Andrew Norton MRN: [id number] DOB: February 19, 1927 Today's Date: 11/06/2012 Time: 5621-3086 PT Time Calculation (min): 17 min  PT Assessment / Plan / Recommendation Comments on Treatment Session  Pt. states less pain this session, only walked a few steps to get to the bed.    Follow Up Recommendations  SNF     Does the patient have the potential to tolerate intense rehabilitation     Barriers to Discharge        Equipment Recommendations  None recommended by PT    Recommendations for Other Services    Frequency 7X/week   Plan      Precautions / Restrictions Precautions Precautions: Posterior Hip Precaution Booklet Issued: Yes (comment)   Pertinent Vitals/Pain     Mobility  Bed Mobility Bed Mobility: Sit to Supine Supine to Sit: HOB elevated;With rails;2: Max assist;1: +2 Total assist Supine to Sit: Patient Percentage: 40% Sitting - Scoot to Edge of Bed: 2: Max assist Sit to Supine: 1: +2 Total assist;HOB flat Sit to Supine: Patient Percentage: 10% Details for Bed Mobility Assistance: assistance for legs onto bed and for trunk to lie down. cues for hip precautions. Transfers Transfers: Sit to Stand;Stand to Sit Sit to Stand: 1: +2 Total assist;From elevated surface;With upper extremity assist;From chair/3-in-1 Sit to Stand: Patient Percentage: 60% Stand to Sit: With armrests;1: +2 Total assist;To bed Stand to Sit: Patient Percentage: 60% Details for Transfer Assistance: cues for hand and LLE placement, posterior precautions. Ambulation/Gait Ambulation/Gait Assistance: 1: +2 Total assist Ambulation/Gait: Patient Percentage: 70% Ambulation Distance (Feet): 6 Feet Assistive device: Rolling walker Ambulation/Gait Assistance Details: cues for sequence, manual assistance to advace LLE  Gait Pattern: Step-to pattern;Trunk flexed;Decreased step length - left;Decreased stance time - left    Exercises     PT Diagnosis: Difficulty  walking;Generalized weakness;Acute pain  PT Problem List: Decreased strength;Decreased range of motion;Decreased activity tolerance;Decreased mobility;Decreased knowledge of use of DME;Decreased safety awareness;Decreased knowledge of precautions;Impaired sensation;Pain PT Treatment Interventions: DME instruction;Gait training;Functional mobility training;Therapeutic activities;Therapeutic exercise;Patient/family education   PT Goals Acute Rehab PT Goals PT Goal Formulation: With patient/family Time For Goal Achievement: 11/20/12 Potential to Achieve Goals: Good Pt will go Supine/Side to Sit: with min assist PT Goal: Supine/Side to Sit - Progress: Goal set today Pt will go Sit to Supine/Side: with min assist PT Goal: Sit to Supine/Side - Progress: Progressing toward goal Pt will go Sit to Stand: with min assist PT Goal: Sit to Stand - Progress: Progressing toward goal Pt will go Stand to Sit: with min assist PT Goal: Stand to Sit - Progress: Progressing toward goal Pt will Ambulate: 51 - 150 feet;with min assist;with rolling walker PT Goal: Ambulate - Progress: Progressing toward goal Pt will Perform Home Exercise Program: with min assist PT Goal: Perform Home Exercise Program - Progress: Goal set today  Visit Information  Last PT Received On: 11/06/12 Assistance Needed: +2    Subjective Data  Subjective: It is not hurting as bad. Patient Stated Goal:  to go to rehab.   Cognition  Cognition Arousal/Alertness: Awake/alert Behavior During Therapy: WFL for tasks assessed/performed Overall Cognitive Status: Within Functional Limits for tasks assessed    Balance  Balance Balance Assessed: Yes Static Sitting Balance Static Sitting - Balance Support: Bilateral upper extremity supported Static Sitting - Level of Assistance: 5: Stand by assistance  End of Session PT - End of Session Activity Tolerance: Patient tolerated treatment well Patient left: with call bell/phone within  reach;with  family/visitor present;in bed Nurse Communication: Mobility status   GP     Rada Hay 11/06/2012, 3:20 PM

## 2012-11-06 NOTE — Progress Notes (Signed)
   Subjective: 1 Day Post-Op Procedure(s) (LRB): Conversion of a Bipolar to a Left Total Hip Arthroplasty (Left) Patient reports pain as moderate.   We will start therapy today.  Plan is to go Skilled nursing facility after hospital stay.  Objective: Vital signs in last 24 hours: Temp:  [96.1 F (35.6 C)-97.7 F (36.5 C)] 97.7 F (36.5 C) (04/24 0621) Pulse Rate:  [60-78] 65 (04/24 0621) Resp:  [12-31] 14 (04/24 0621) BP: (124-166)/(47-99) 166/76 mmHg (04/24 0621) SpO2:  [93 %-100 %] 100 % (04/24 0621) FiO2 (%):  [100 %] 100 % (04/23 1900) Weight:  [173 lb (78.472 kg)] 173 lb (78.472 kg) (04/23 1900)  Intake/Output from previous day:  Intake/Output Summary (Last 24 hours) at 11/06/12 0707 Last data filed at 11/06/12 0700  Gross per 24 hour  Intake 4658.33 ml  Output   2880 ml  Net 1778.33 ml    Intake/Output this shift:    Labs:  Recent Labs  11/06/12 0502  HGB 9.1*    Recent Labs  11/06/12 0502  WBC 6.2  RBC 2.98*  HCT 26.3*  PLT 176    Recent Labs  11/05/12 1235 11/06/12 0502  NA 140 134*  K 4.6 4.6  CL 104 102  CO2 27 28  BUN 16 12  CREATININE 0.77 0.74  GLUCOSE 126* 163*  CALCIUM 8.9 8.2*   No results found for this basename: LABPT, INR,  in the last 72 hours  EXAM General - Patient is Alert, Appropriate and Oriented Extremity - Neurologically intact Neurovascular intact No cellulitis present Dressing - dressing C/D/I Motor Function - intact, moving foot and toes well on exam.  Hemovac pulled without difficulty.  Past Medical History  Diagnosis Date  . Anemia   . Diabetes mellitus   . Hyperlipidemia   . Renal insufficiency   . Paget's disease     scrotum  . Positive PPD, treated   . Hypertension     EKG, chest x ray 11/15/10 EPIC,  clearance Dr Abran Cantor on chart/ pt states on meds to "prevent hypertension from diabetes"  . Carpal tunnel syndrome     left leg sciatica  . Easy bruising   . Stroke     SPINAL CORD STROKE PER OV note  3/13  DR LOVE CLEARANCE NOTE_  ON CHART WITH RECOMMENDATIONS-   has weakness  upper extremities  . Arthritis     hips  . Hearing loss     left ear greater loss  . Cancer     skin can lesions, and non cancer skin lesions-follwed by Dr. Margo Aye  . History of shingles     past hx. left hand    Assessment/Plan: 1 Day Post-Op Procedure(s) (LRB): Conversion of a Bipolar to a Left Total Hip Arthroplasty (Left) Principal Problem:   Pain with hip hemiarthroplasty   Advance diet Up with therapy D/C IV fluids Discharge to SNF. Prefers [facility]. CSW consulted  DVT Prophylaxis - Xarelto Weight-Bearing as tolerated to left leg D/C O2 and Pulse OX and try on Room Air  Butler Vegh V 11/06/2012, 7:07 AM

## 2012-11-06 NOTE — Progress Notes (Signed)
Utilization review completed.  

## 2012-11-06 NOTE — Progress Notes (Addendum)
Clinical Social Work Department CLINICAL SOCIAL WORK PLACEMENT NOTE 11/06/2012  Patient:  Andrew Norton, Andrew Norton  Account Number:  [id number] Admit date:  11/05/2012  Clinical Social Worker:  Jacelyn Grip  Date/time:  11/06/2012 11:15 AM  Clinical Social Work is seeking post-discharge placement for this patient at the following level of care:   SKILLED NURSING   (*CSW will update this form in Epic as items are completed)   11/06/2012  Patient/family provided with [facility] Department of Clinical Social Work's list of facilities offering this level of care within the geographic area requested by the patient (or if unable, by the patient's family).  11/06/2012  Patient/family informed of their freedom to choose among providers that offer the needed level of care, that participate in Medicare, Medicaid or managed care program needed by the patient, have an available bed and are willing to accept the patient.  11/06/2012  Patient/family informed of [facility]' ownership interest in [facility], as well as of the fact that they are under no obligation to receive care at this facility.  PASARR submitted to EDS on 11/06/2012 PASARR number received from EDS on 11/06/2012- existing  FL2 transmitted to all facilities in geographic area requested by pt/family on  11/06/2012 FL2 transmitted to all facilities within larger geographic area on   Patient informed that his/her managed care company has contracts with or will negotiate with  certain facilities, including the following:     Patient/family informed of bed offers received:  11/06/2012 Patient chooses bed at [facility] Physician recommends and patient chooses bed at    Patient to be transferred to  on  [facility] on  Patient to be transferred to facility by   The following physician request were entered in Epic:   Additional Comments: Pt preference is [facility] and [facility].  Addendum 4/25: [facility]  does not have bed availability, pt agreeable to [facility].    Jacklynn Lewis, MSW, LCSWA (coverage for Cori Razor, LCSW) Clinical Social Work

## 2012-11-06 NOTE — Progress Notes (Signed)
CSW met with pt and pt spouse to provide SNF bed offers in order for pt to have secondary choices for if [facility] continues to not have bed available.  Pt and pt spouse stated that [facility] would be second choice if [facility] does not have a bed to become available.  CSW to follow up with [facility] tomorrow morning to get update on bed availability.   CSW to continue to follow and facilitate pt discharge needs when pt medically ready for discharge.  Jacklynn Lewis, MSW, LCSWA  Clinical Social Work [phone]

## 2012-11-07 LAB — CBC
Platelets: 196 10*3/uL (ref 150–400)
RBC: 2.82 MIL/uL — ABNORMAL LOW (ref 4.22–5.81)
RDW: 12.7 % (ref 11.5–15.5)
WBC: 9.9 10*3/uL (ref 4.0–10.5)

## 2012-11-07 LAB — BASIC METABOLIC PANEL
CO2: 26 mEq/L (ref 19–32)
Chloride: 98 mEq/L (ref 96–112)
GFR calc Af Amer: >90 mL/min (ref >90)
Potassium: 4.2 mEq/L (ref 3.5–5.1)

## 2012-11-07 LAB — GLUCOSE, CAPILLARY
Glucose-Capillary: 159 mg/dL — ABNORMAL HIGH (ref 70–99)
Glucose-Capillary: 120 mg/dL — ABNORMAL HIGH (ref 70–99)

## 2012-11-07 MED ORDER — SENNA 8.6 MG PO TABS
1.0000 | ORAL_TABLET | Freq: Every day | ORAL | Status: DC
  Administered 2012-11-07 – 2012-11-08 (×2): 8.6 mg via ORAL
  Filled 2012-11-07 (×2): qty 1

## 2012-11-07 NOTE — Discharge Summary (Signed)
Physician Discharge Summary   Patient ID: Andrew Norton MRN: [id number] DOB/AGE: [birth year] 79 y.o.  Admit date: 11/05/2012 Discharge date: 11/08/2012  Primary Diagnosis: Failed hemiarthroplasty left hip  Admission Diagnoses:  Past Medical History  Diagnosis Date  . Anemia   . Diabetes mellitus   . Hyperlipidemia   . Renal insufficiency   . Paget's disease     scrotum  . Positive PPD, treated   . Hypertension     EKG, chest x ray 11/15/10 [facility],  clearance Dr Abran Cantor on chart/ pt states on meds to "prevent hypertension from diabetes"  . Carpal tunnel syndrome     left leg sciatica  . Easy bruising   . Stroke     SPINAL CORD STROKE PER OV note 3/13  DR LOVE CLEARANCE NOTE_  ON CHART WITH RECOMMENDATIONS-   has weakness  upper extremities  . Arthritis     hips  . Hearing loss     left ear greater loss  . Cancer     skin can lesions, and non cancer skin lesions-follwed by Dr. Margo Aye  . History of shingles     past hx. left hand   Discharge Diagnoses:   Principal Problem:   Pain with hip hemiarthroplasty  Estimated body mass index is 23.51 kg/(m^2) as calculated from the following:   Height as of this encounter: 6' (1.829 m).   Weight as of this encounter: 78.642 kg (173 lb 6 oz).  Procedure(s) (LRB): Conversion of a Bipolar to a Left Total Hip Arthroplasty (Left)   Consults: None  HPI: Andrew Norton, 79 y.o. male, has a history of pain and functional disability in the left hip due to arthritis and patient has failed non-surgical conservative treatments for greater than 12 weeks to include NSAID's and/or analgesics, use of assistive devices and activity modification. The indications for the revision total hip arthroplasty are severe degenerative changes within the acetabulum since the left hip hemiarthroplasty. Onset of symptoms was gradual starting 2 years ago with gradually worsening course since that time. Prior procedures on the left hip include hemi-arthroplasty. Patient currently  rates pain in the left hip at 6 out of 10 with activity. There is night pain, worsening of pain with activity and weight bearing, pain that interfers with activities of daily living and pain with passive range of motion. Patient has evidence of periarticular osteophytes and joint space narrowing by imaging studies. This condition presents safety issues increasing the risk of falls. This patient has had failure of hemi-arthroplasty. There is no current active infection.   Laboratory Data: Admission on 11/05/2012  Component Date Value Range Status  . Sodium 11/05/2012 140  135 - 145 mEq/L Final  . Potassium 11/05/2012 4.6  3.5 - 5.1 mEq/L Final  . Chloride 11/05/2012 104  96 - 112 mEq/L Final  . CO2 11/05/2012 27  19 - 32 mEq/L Final  . Glucose, Bld 11/05/2012 126* 70 - 99 mg/dL Final  . BUN 78/29/5621 16  6 - 23 mg/dL Final  . Creatinine, Ser 11/05/2012 0.77  0.50 - 1.35 mg/dL Final  . Calcium 30/86/5784 8.9  8.4 - 10.5 mg/dL Final  . GFR calc non Af Amer 11/05/2012 81* >90 mL/min Final  . GFR calc Af Amer 11/05/2012 >90  >90 mL/min Final   Comment:                                 The  eGFR has been calculated                          using the CKD EPI equation.                          This calculation has not been                          validated in all clinical                          situations.                          eGFR's persistently                          <90 mL/min signify                          possible Chronic Kidney Disease.  . ABO/RH(D) 11/05/2012 A POS   Final  . Antibody Screen 11/05/2012 NEG   Final  . Sample Expiration 11/05/2012 11/08/2012   Final  . Glucose-Capillary 11/05/2012 117* 70 - 99 mg/dL Final  . Glucose-Capillary 11/05/2012 119* 70 - 99 mg/dL Final  . Comment 1 16/04/9603 Documented in Chart   Final  . Comment 2 11/05/2012 Notify RN   Final  . WBC 11/06/2012 6.2  4.0 - 10.5 K/uL Final  . RBC 11/06/2012 2.98* 4.22 - 5.81 MIL/uL Final  . Hemoglobin  11/06/2012 9.1* 13.0 - 17.0 g/dL Final  . HCT 54/03/8118 26.3* 39.0 - 52.0 % Final  . MCV 11/06/2012 88.3  78.0 - 100.0 fL Final  . MCH 11/06/2012 30.5  26.0 - 34.0 pg Final  . MCHC 11/06/2012 34.6  30.0 - 36.0 g/dL Final  . RDW 14/78/2956 13.0  11.5 - 15.5 % Final  . Platelets 11/06/2012 176  150 - 400 K/uL Final  . Sodium 11/06/2012 134* 135 - 145 mEq/L Final  . Potassium 11/06/2012 4.6  3.5 - 5.1 mEq/L Final  . Chloride 11/06/2012 102  96 - 112 mEq/L Final  . CO2 11/06/2012 28  19 - 32 mEq/L Final  . Glucose, Bld 11/06/2012 163* 70 - 99 mg/dL Final  . BUN 21/30/8657 12  6 - 23 mg/dL Final  . Creatinine, Ser 11/06/2012 0.74  0.50 - 1.35 mg/dL Final  . Calcium 84/69/6295 8.2* 8.4 - 10.5 mg/dL Final  . GFR calc non Af Amer 11/06/2012 82* >90 mL/min Final  . GFR calc Af Amer 11/06/2012 >90  >90 mL/min Final   Comment:                                 The eGFR has been calculated                          using the CKD EPI equation.                          This calculation has not been  validated in all clinical                          situations.                          eGFR's persistently                          <90 mL/min signify                          possible Chronic Kidney Disease.  . Glucose-Capillary 11/05/2012 143* 70 - 99 mg/dL Final  . Glucose-Capillary 11/06/2012 136* 70 - 99 mg/dL Final  . Glucose-Capillary 11/06/2012 197* 70 - 99 mg/dL Final  . Glucose-Capillary 11/06/2012 210* 70 - 99 mg/dL Final  . WBC 11/91/4782 9.9  4.0 - 10.5 K/uL Final  . RBC 11/07/2012 2.82* 4.22 - 5.81 MIL/uL Final  . Hemoglobin 11/07/2012 8.7* 13.0 - 17.0 g/dL Final  . HCT 95/62/1308 24.2* 39.0 - 52.0 % Final  . MCV 11/07/2012 85.8  78.0 - 100.0 fL Final  . MCH 11/07/2012 30.9  26.0 - 34.0 pg Final  . MCHC 11/07/2012 36.0  30.0 - 36.0 g/dL Final  . RDW 65/78/4696 12.7  11.5 - 15.5 % Final  . Platelets 11/07/2012 196  150 - 400 K/uL Final  . Sodium 11/07/2012 131*  135 - 145 mEq/L Final  . Potassium 11/07/2012 4.2  3.5 - 5.1 mEq/L Final  . Chloride 11/07/2012 98  96 - 112 mEq/L Final  . CO2 11/07/2012 26  19 - 32 mEq/L Final  . Glucose, Bld 11/07/2012 186* 70 - 99 mg/dL Final  . BUN 29/52/8413 13  6 - 23 mg/dL Final  . Creatinine, Ser 11/07/2012 0.69  0.50 - 1.35 mg/dL Final  . Calcium 24/40/1027 8.5  8.4 - 10.5 mg/dL Final  . GFR calc non Af Amer 11/07/2012 85* >90 mL/min Final  . GFR calc Af Amer 11/07/2012 >90  >90 mL/min Final   Comment:                                 The eGFR has been calculated                          using the CKD EPI equation.                          This calculation has not been                          validated in all clinical                          situations.                          eGFR's persistently                          <90 mL/min signify                          possible Chronic  Kidney Disease.  . Glucose-Capillary 11/06/2012 237* 70 - 99 mg/dL Final  . Glucose-Capillary 11/07/2012 159* 70 - 99 mg/dL Final  Hospital Outpatient Visit on 10/23/2012  Component Date Value Range Status  . WBC 10/23/2012 5.6  4.0 - 10.5 K/uL Final  . RBC 10/23/2012 3.56* 4.22 - 5.81 MIL/uL Final  . Hemoglobin 10/23/2012 10.7* 13.0 - 17.0 g/dL Final  . HCT 16/04/9603 32.0* 39.0 - 52.0 % Final  . MCV 10/23/2012 89.9  78.0 - 100.0 fL Final  . MCH 10/23/2012 30.1  26.0 - 34.0 pg Final  . MCHC 10/23/2012 33.4  30.0 - 36.0 g/dL Final  . RDW 54/03/8118 13.0  11.5 - 15.5 % Final  . Platelets 10/23/2012 258  150 - 400 K/uL Final  . Sodium 10/23/2012 139  135 - 145 mEq/L Final  . Potassium 10/23/2012 6.0* 3.5 - 5.1 mEq/L Final  . Chloride 10/23/2012 104  96 - 112 mEq/L Final  . CO2 10/23/2012 28  19 - 32 mEq/L Final  . Glucose, Bld 10/23/2012 185* 70 - 99 mg/dL Final  . BUN 14/78/2956 18  6 - 23 mg/dL Final  . Creatinine, Ser 10/23/2012 0.82  0.50 - 1.35 mg/dL Final  . Calcium 21/30/8657 9.3  8.4 - 10.5 mg/dL Final  . Total  Protein 10/23/2012 7.0  6.0 - 8.3 g/dL Final  . Albumin 84/69/6295 3.7  3.5 - 5.2 g/dL Final  . AST 28/41/3244 20  0 - 37 U/L Final  . ALT 10/23/2012 13  0 - 53 U/L Final  . Alkaline Phosphatase 10/23/2012 108  39 - 117 U/L Final  . Total Bilirubin 10/23/2012 0.3  0.3 - 1.2 mg/dL Final  . GFR calc non Af Amer 10/23/2012 79* >90 mL/min Final  . GFR calc Af Amer 10/23/2012 >90  >90 mL/min Final   Comment:                                 The eGFR has been calculated                          using the CKD EPI equation.                          This calculation has not been                          validated in all clinical                          situations.                          eGFR's persistently                          <90 mL/min signify                          possible Chronic Kidney Disease.  Marland Kitchen Prothrombin Time 10/23/2012 14.1  11.6 - 15.2 seconds Final  . INR 10/23/2012 1.10  0.00 - 1.49 Final  . aPTT 10/23/2012 35  24 - 37 seconds Final  . Color, Urine 10/23/2012 YELLOW  YELLOW Final  . APPearance 10/23/2012 CLEAR  CLEAR  Final  . Specific Gravity, Urine 10/23/2012 1.023  1.005 - 1.030 Final  . pH 10/23/2012 6.0  5.0 - 8.0 Final  . Glucose, UA 10/23/2012 >1000* NEGATIVE mg/dL Final  . Hgb urine dipstick 10/23/2012 NEGATIVE  NEGATIVE Final  . Bilirubin Urine 10/23/2012 NEGATIVE  NEGATIVE Final  . Ketones, ur 10/23/2012 NEGATIVE  NEGATIVE mg/dL Final  . Protein, ur 16/04/9603 NEGATIVE  NEGATIVE mg/dL Final  . Urobilinogen, UA 10/23/2012 0.2  0.0 - 1.0 mg/dL Final  . Nitrite 54/03/8118 NEGATIVE  NEGATIVE Final  . Leukocytes, UA 10/23/2012 NEGATIVE  NEGATIVE Final  . MRSA, PCR 10/23/2012 NEGATIVE  NEGATIVE Final  . Staphylococcus aureus 10/23/2012 NEGATIVE  NEGATIVE Final   Comment:                                 The Xpert SA Assay (FDA                          approved for NASAL specimens                          in patients over 43 years of age),                           is one component of                          a comprehensive surveillance                          program.  Test performance has                          been validated by [employer] for patients greater                          than or equal to 17 year old.                          It is not intended                          to diagnose infection nor to                          guide or monitor treatment.  . Squamous Epithelial / LPF 10/23/2012 RARE  RARE Final     X-Rays:Dg Chest 2 View  10/23/2012  *RADIOLOGY REPORT*  Clinical Data: Preoperative respiratory films.  CHEST - 2 VIEW  Comparison: PA and lateral chest 11/15/2010.  Findings: There is unchanged mild scarring in the lung bases. Lungs otherwise clear.  Heart size is normal.  No pneumothorax or pleural effusion.  IMPRESSION: No acute disease.  No compared to prior exam.   Original Report Authenticated By: Holley Dexter, M.D.    X-ray Hip Left Ap And Lateral  10/23/2012  *RADIOLOGY REPORT*  Clinical Data: Hip pain.  History of hip replacement.  Preoperative film.  LEFT HIP - COMPLETE 2+ VIEW  Comparison: Plain films 11/24/2010.  Findings: The patient has a bipolar left hip hemiarthroplasty.  The device is located.  There is some heterotopic ossification about the femoral neck.  No fracture is identified.  Moderate right hip degenerative disease is unchanged. A large stool burden in all visualized colon is noted.  IMPRESSION:  1.  Left hip hemiarthroplasty without acute abnormality. 2.  Moderate right hip degenerative change. 3.  Constipation.   Original Report Authenticated By: Holley Dexter, M.D.    Dg Pelvis Portable  11/05/2012  *RADIOLOGY REPORT*  Clinical Data: 79 year old male postop left hip.  PORTABLE PELVIS  Comparison: 11/26/2010.  Findings: Portable AP view of the pelvis.  Previous left hip arthroplasty revised with total hip hardware.  Hardware components appear intact normally aligned on this  single view.  No unexpected osseous changes.  Degenerative changes in lumbar spine.  Calcified atherosclerosis.  Postoperative drain in place about the left hip.  IMPRESSION: Left hip arthroplasty revision.  No adverse features identified.   Original Report Authenticated By: Erskine Speed, M.D.     EKG: Orders placed in visit on 04/10/12  . EKG 12-LEAD     Hospital Course: Patient was admitted to [facility] and taken to the OR and underwent the above state procedure without complications.  Patient tolerated the procedure well and was later transferred to the recovery room and then to the orthopaedic floor for postoperative care.  They were given PO and IV analgesics for pain control following their surgery.  They were given 24 hours of postoperative antibiotics of  Anti-infectives   Start     Dose/Rate Route Frequency Ordered Stop   11/06/12 0330  vancomycin (VANCOCIN) IVPB 1000 mg/200 mL premix     1,000 mg 200 mL/hr over 60 Minutes Intravenous Every 12 hours 11/05/12 1913 11/06/12 0410   11/05/12 0600  vancomycin (VANCOCIN) 1,500 mg in sodium chloride 0.9 % 500 mL IVPB     1,500 mg 250 mL/hr over 120 Minutes Intravenous On call to O.R. 11/04/12 1907 11/05/12 1556     and started on DVT prophylaxis in the form of Xarelto.   PT and OT were ordered for total hip protocol.  The patient was allowed to be WBAT with therapy. Discharge planning was consulted to help with postop disposition and equipment needs.  Patient had a fair night on the evening of surgery.  They started to get up OOB with therapy on day one.  Hemovac drain was pulled without difficulty.  The knee immobilizer was removed and discontinued.  Continued to work with therapy into day two.  Dressing was changed on day two and the incision was clean and dry.  By day three, the patient had progressed with therapy but needed more assistance from SNF.  Incision was healing well.  Patient was seen in rounds and was ready to go  SNF.   Discharge Medications: Prior to Admission medications   Medication Sig Start Date End Date Taking? Authorizing Provider  finasteride (PROSCAR) 5 MG tablet Take 1 tablet (5 mg total) by mouth daily. 06/27/12  Yes Nelwyn Salisbury, MD  gabapentin (NEURONTIN) 100 MG capsule Take 3 capsules (300 mg total) by mouth 4 (four) times daily. 04/10/12  Yes Nelwyn Salisbury, MD  glipiZIDE (GLUCOTROL) 10 MG tablet Take 1 tablet (10 mg total) by mouth 2 (two) times daily before a meal. 04/10/12  Yes Nelwyn Salisbury, MD  losartan (COZAAR) 50 MG tablet  Take 1 tablet (50 mg total) by mouth every morning. 04/10/12  Yes Nelwyn Salisbury, MD  metFORMIN (GLUCOPHAGE) 500 MG tablet Take 500 mg by mouth 2 (two) times daily with a meal.   Yes Historical Provider, MD  polyethylene glycol powder (GLYCOLAX/MIRALAX) powder Take 17 g by mouth daily.  08/27/11  Yes Nelwyn Salisbury, MD  simvastatin (ZOCOR) 40 MG tablet Take 40 mg by mouth every evening.   Yes Historical Provider, MD  bisacodyl (DULCOLAX) 10 MG suppository Place 1 suppository (10 mg total) rectally daily as needed. 11/06/12   Zoi Devine Tamala Ser, PA-C  cyanocobalamin (,VITAMIN B-12,) 1000 MCG/ML injection Inject 1,000 mcg into the muscle every 30 (thirty) days.    Historical Provider, MD  lidocaine (LIDODERM) 5 % Place 1 patch onto the skin daily. Applies to lower back 05/30/11   Nelwyn Salisbury, MD  methocarbamol (ROBAXIN) 500 MG tablet Take 1 tablet (500 mg total) by mouth every 6 (six) hours as needed. 11/06/12   Natnael Biederman Tamala Ser, PA-C  oxyCODONE (OXY IR/ROXICODONE) 5 MG immediate release tablet Take 1-2 tablets (5-10 mg total) by mouth every 3 (three) hours as needed. 11/06/12   Jamil Armwood Tamala Ser, PA-C  rivaroxaban (XARELTO) 10 MG TABS tablet Take 1 tablet (10 mg total) by mouth daily with breakfast. 11/06/12   London Tarnowski Tamala Ser, PA-C  traMADol (ULTRAM) 50 MG tablet Take 1-2 tablets (50-100 mg total) by mouth every 6 (six) hours as needed. 11/06/12   Sender Rueb  Tamala Ser, PA-C    Diet: Cardiac diet and Diabetic diet Activity:WBAT No bending hip over 90 degrees- A "L" Angle Do not cross legs Do not let foot roll inward When turning these patients a pillow should be placed between the patient's legs to prevent crossing. Patients should have the affected knee fully extended when trying to sit or stand from all surfaces to prevent excessive hip flexion. When ambulating and turning toward the affected side the affected leg should have the toes turned out prior to moving the walker and the rest of patient's body as to prevent internal rotation/ turning in of the leg. Abduction pillows are the most effective way to prevent a patient from not crossing legs or turning toes in at rest. If an abduction pillow is not ordered placing a regular pillow length wise between the patient's legs is also an effective reminder. It is imperative that these precautions be maintained so that the surgical hip does not dislocate. Follow-up:in 2 weeks Disposition - Skilled nursing facility Discharged Condition: good   Discharge Orders   Future Appointments Provider Department Dept Phone   12/22/2012 11:00 AM Huston Foley, MD [facility] [phone]   Future Orders Complete By Expires     Call MD / Call 911  As directed     Comments:      If you experience chest pain or shortness of breath, CALL 911 and be transported to the hospital emergency room.  If you develope a fever above 101 F, pus (white drainage) or increased drainage or redness at the wound, or calf pain, call your surgeon's office.    Change dressing  As directed     Comments:      You may change your dressing daily with sterile 4 x 4 inch gauze dressing and paper tape.    Constipation Prevention  As directed     Comments:      Drink plenty of fluids.  Prune juice may be helpful.  You may use  a stool softener, such as Colace (over the counter) 100 mg twice a day.  Use MiraLax (over the  counter) for constipation as needed.    Diet Carb Modified  As directed     Driving restrictions  As directed     Comments:      No driving    Follow the hip precautions as taught in Physical Therapy  As directed     Increase activity slowly as tolerated  As directed         Medication List    STOP taking these medications       aspirin 81 MG chewable tablet     cyclobenzaprine 10 MG tablet  Commonly known as:  FLEXERIL     ferrous sulfate 220 (44 FE) MG/5ML solution     HYDROcodone-acetaminophen 5-325 MG per tablet  Commonly known as:  NORCO/VICODIN      TAKE these medications       bisacodyl 10 MG suppository  Commonly known as:  DULCOLAX  Place 1 suppository (10 mg total) rectally daily as needed.     cyanocobalamin 1000 MCG/ML injection  Commonly known as:  (VITAMIN B-12)  Inject 1,000 mcg into the muscle every 30 (thirty) days.     finasteride 5 MG tablet  Commonly known as:  PROSCAR  Take 1 tablet (5 mg total) by mouth daily.     gabapentin 100 MG capsule  Commonly known as:  NEURONTIN  Take 3 capsules (300 mg total) by mouth 4 (four) times daily.     glipiZIDE 10 MG tablet  Commonly known as:  GLUCOTROL  Take 1 tablet (10 mg total) by mouth 2 (two) times daily before a meal.     lidocaine 5 %  Commonly known as:  LIDODERM  Place 1 patch onto the skin daily. Applies to lower back     losartan 50 MG tablet  Commonly known as:  COZAAR  Take 1 tablet (50 mg total) by mouth every morning.     metFORMIN 500 MG tablet  Commonly known as:  GLUCOPHAGE  Take 500 mg by mouth 2 (two) times daily with a meal.     methocarbamol 500 MG tablet  Commonly known as:  ROBAXIN  Take 1 tablet (500 mg total) by mouth every 6 (six) hours as needed.     oxyCODONE 5 MG immediate release tablet  Commonly known as:  Oxy IR/ROXICODONE  Take 1-2 tablets (5-10 mg total) by mouth every 3 (three) hours as needed.     polyethylene glycol powder powder  Commonly known as:   GLYCOLAX/MIRALAX  Take 17 g by mouth daily.     rivaroxaban 10 MG Tabs tablet  Commonly known as:  XARELTO  Take 1 tablet (10 mg total) by mouth daily with breakfast.     simvastatin 40 MG tablet  Commonly known as:  ZOCOR  Take 40 mg by mouth every evening.     traMADol 50 MG tablet  Commonly known as:  ULTRAM  Take 1-2 tablets (50-100 mg total) by mouth every 6 (six) hours as needed.           Follow-up Information   Follow up with Loanne Drilling, MD. Schedule an appointment as soon as possible for a visit on 11/20/2012.   Contact information:   [address] [place] [address] [phone]       Signed: Celedonio Savage Alyjah Lovingood LAUREN 11/07/2012, 7:24 AM

## 2012-11-07 NOTE — Progress Notes (Signed)
CSW received notification from [facility] and [facility] that facility does not have any bed availability for patient and do not anticipate bed will become available.  CSW met with pt at bedside to discuss. Pt is agreeable to [facility] for rehab since [facility] is unable to accept.   CSW notified [facility] who confirmed bed availability when pt medically ready and can accept pt during weekend with preliminary discharge summary if pt medically ready during the weekend.  Preliminary Discharge Summary faxed via [facility].  Facility plans to complete admission paperwork with pt and pt wife today.  CSW to continue to follow and facilitate pt discharge needs when pt medically ready for discharge.  Jacklynn Lewis, MSW, LCSWA  Clinical Social Work [phone]

## 2012-11-07 NOTE — Progress Notes (Signed)
Physical Therapy Treatment Patient Details Name: AYOMIDE PURDY MRN: [id number] DOB: 04-08-1927 Today's Date: 11/07/2012 Time: 4098-1191 PT Time Calculation (min): 29 min  PT Assessment / Plan / Recommendation Comments on Treatment Session  POD # 2 L THR converted from ORIF.  PM session.  Amb in hallway then assisted back to bed.  Spouse present.    Follow Up Recommendations  SNF     Does the patient have the potential to tolerate intense rehabilitation     Barriers to Discharge        Equipment Recommendations  None recommended by PT    Recommendations for Other Services    Frequency 7X/week   Plan      Precautions / Restrictions Precautions Precautions: Posterior Hip;Fall Precaution Booklet Issued: Yes (comment) Precaution Comments: Pt recalled 1/3 THP so re educated/demonstrated   Pertinent Vitals/Pain C/o fatigue and "some' hip pain during gait ICE applied    Mobility  Bed Mobility Bed Mobility: Sit to Supine Sit to Supine: 3: Mod assist Details for Bed Mobility Assistance: Mod assist to support B LE onto bed and scoot to middle of bed using trapeze and rails Transfers Transfers: Stand to Sit;Sit to Stand Sit to Stand: 3: Mod assist;From chair/3-in-1 Stand to Sit: 3: Mod assist;To bed Details for Transfer Assistance: 50% VC's on proper tech to aviod hip flex > 90' Ambulation/Gait Ambulation/Gait Assistance: 4: Min assist;3: Mod assist Ambulation Distance (Feet): 95 Feet Assistive device: Rolling walker Ambulation/Gait Assistance Details: 25% VC's on proper walker to self distance and increased time.  Also noted mild posterior lean. Gait Pattern: Step-to pattern;Trunk flexed;Decreased step length - left;Decreased stance time - left Gait velocity: decreased    Exercises     PT Goals                                                Progressing     Visit Information  Last PT Received On: 11/07/12 Assistance Needed: +1    Subjective Data      Cognition       Balance   fair +  End of Session PT - End of Session Equipment Utilized During Treatment: Gait belt Activity Tolerance: Patient tolerated treatment well Patient left: in bed;with call bell/phone within reach   Felecia Shelling  PTA [facility]  Acute  Rehab Pager      [phone]

## 2012-11-07 NOTE — Progress Notes (Signed)
Physical Therapy Treatment Patient Details Name: DIYARI CHERNE MRN: [id number] DOB: [birth year] Today's Date: 11/07/2012 Time: 7829-5621 PT Time Calculation (min): 27 min  PT Assessment / Plan / Recommendation Comments on Treatment Session  POD # 2 L THR converted from ORIF planning to D/C to SNF for ST Rehab.  Amb pt in hallway then performed TE's.  Positioned to comfort then applied ICE.    Follow Up Recommendations  SNF     Does the patient have the potential to tolerate intense rehabilitation     Barriers to Discharge        Equipment Recommendations  None recommended by PT    Recommendations for Other Services    Frequency 7X/week   Plan      Precautions / Restrictions Precautions Precautions: Posterior Hip;Fall Precaution Booklet Issued: Yes (comment) Precaution Comments: Pt recalled 1/3 THP so re educated/demonstrated   Pertinent Vitals/Pain C/o 8/10 L hip pain ICE applied    Mobility  Bed Mobility Bed Mobility: Not assessed Details for Bed Mobility Assistance: Pt standing at bed side with son on arrival Transfers Transfers: Stand to Sit Stand to Sit: 4: Min assist Details for Transfer Assistance: 50% VC's on proper tech to aviod hip flex > 90' Ambulation/Gait Ambulation/Gait Assistance: 4: Min assist;3: Mod assist Ambulation Distance (Feet): 90 Feet Assistive device: Rolling walker Ambulation/Gait Assistance Details: 25% VC's on proper sequencing and upright posture.  Increased time and 50% VC's with safety with turns. Gait Pattern: Step-to pattern;Trunk flexed;Decreased step length - left;Decreased stance time - left Gait velocity: decreased    Exercises   Total Hip Replacement TE's 10 reps ankle pumps 10 reps knee presses 10 reps heel slides 10 reps SAQ's 2 reps ABD Followed by ICE    PT Goals                                           progressing    Visit Information  Last PT Received On: 11/07/12 Assistance Needed: +1    Subjective Data       Cognition    fair+   Balance   fair  End of Session PT - End of Session Equipment Utilized During Treatment: Gait belt Activity Tolerance: Patient tolerated treatment well Patient left: with call bell/phone within reach;in bed   Felecia Shelling  PTA [facility]  Acute  Rehab Pager      [phone]

## 2012-11-08 DIAGNOSIS — M545 Low back pain: Secondary | ICD-10-CM | POA: Diagnosis not present

## 2012-11-08 DIAGNOSIS — D649 Anemia, unspecified: Secondary | ICD-10-CM | POA: Diagnosis not present

## 2012-11-08 DIAGNOSIS — Z96649 Presence of unspecified artificial hip joint: Secondary | ICD-10-CM | POA: Diagnosis not present

## 2012-11-08 DIAGNOSIS — E1149 Type 2 diabetes mellitus with other diabetic neurological complication: Secondary | ICD-10-CM | POA: Diagnosis not present

## 2012-11-08 DIAGNOSIS — E538 Deficiency of other specified B group vitamins: Secondary | ICD-10-CM | POA: Diagnosis not present

## 2012-11-08 DIAGNOSIS — H9190 Unspecified hearing loss, unspecified ear: Secondary | ICD-10-CM | POA: Diagnosis not present

## 2012-11-08 DIAGNOSIS — D518 Other vitamin B12 deficiency anemias: Secondary | ICD-10-CM | POA: Diagnosis not present

## 2012-11-08 DIAGNOSIS — I10 Essential (primary) hypertension: Secondary | ICD-10-CM | POA: Diagnosis not present

## 2012-11-08 DIAGNOSIS — Z471 Aftercare following joint replacement surgery: Secondary | ICD-10-CM | POA: Diagnosis not present

## 2012-11-08 DIAGNOSIS — D62 Acute posthemorrhagic anemia: Secondary | ICD-10-CM | POA: Diagnosis not present

## 2012-11-08 DIAGNOSIS — M1610 Unilateral primary osteoarthritis, unspecified hip: Secondary | ICD-10-CM | POA: Diagnosis not present

## 2012-11-08 DIAGNOSIS — M129 Arthropathy, unspecified: Secondary | ICD-10-CM | POA: Diagnosis not present

## 2012-11-08 DIAGNOSIS — M1990 Unspecified osteoarthritis, unspecified site: Secondary | ICD-10-CM | POA: Diagnosis not present

## 2012-11-08 DIAGNOSIS — G609 Hereditary and idiopathic neuropathy, unspecified: Secondary | ICD-10-CM | POA: Diagnosis not present

## 2012-11-08 DIAGNOSIS — N19 Unspecified kidney failure: Secondary | ICD-10-CM | POA: Diagnosis not present

## 2012-11-08 DIAGNOSIS — R269 Unspecified abnormalities of gait and mobility: Secondary | ICD-10-CM | POA: Diagnosis not present

## 2012-11-08 DIAGNOSIS — E785 Hyperlipidemia, unspecified: Secondary | ICD-10-CM | POA: Diagnosis not present

## 2012-11-08 DIAGNOSIS — M25559 Pain in unspecified hip: Secondary | ICD-10-CM | POA: Diagnosis not present

## 2012-11-08 DIAGNOSIS — I69998 Other sequelae following unspecified cerebrovascular disease: Secondary | ICD-10-CM | POA: Diagnosis not present

## 2012-11-08 DIAGNOSIS — M6281 Muscle weakness (generalized): Secondary | ICD-10-CM | POA: Diagnosis not present

## 2012-11-08 LAB — CBC
MCV: 87.3 fL (ref 78.0–100.0)
Platelets: 213 10*3/uL (ref 150–400)
RBC: 2.99 MIL/uL — ABNORMAL LOW (ref 4.22–5.81)
RDW: 13.3 % (ref 11.5–15.5)
WBC: 10 10*3/uL (ref 4.0–10.5)

## 2012-11-08 LAB — GLUCOSE, CAPILLARY
Glucose-Capillary: 53 mg/dL — ABNORMAL LOW (ref 70–99)
Glucose-Capillary: 69 mg/dL — ABNORMAL LOW (ref 70–99)
Glucose-Capillary: 115 mg/dL — ABNORMAL HIGH (ref 70–99)

## 2012-11-08 NOTE — Progress Notes (Signed)
Physical Therapy Treatment Patient Details Name: Andrew Norton MRN: [id number] DOB: September 12, 1926 Today's Date: 11/08/2012 Time: 0922-1002 PT Time Calculation (min): 40 min  PT Assessment / Plan / Recommendation Comments on Treatment Session  Pt assisted with donning pants at EOB    Follow Up Recommendations  SNF     Does the patient have the potential to tolerate intense rehabilitation     Barriers to Discharge        Equipment Recommendations  None recommended by PT    Recommendations for Other Services    Frequency 7X/week   Plan Discharge plan remains appropriate    Precautions / Restrictions Precautions Precautions: Posterior Hip;Fall Precaution Booklet Issued: Yes (comment) Precaution Comments: Pt recalled 1/3 THP so re educated/demonstrated Restrictions Weight Bearing Restrictions: No   Pertinent Vitals/Pain 7/10; premed, ice packs provided    Mobility  Bed Mobility Bed Mobility: Supine to Sit Supine to Sit: HOB elevated;1: +2 Total assist Supine to Sit: Patient Percentage: 50% Sitting - Scoot to Edge of Bed: 4: Min assist;3: Mod assist Details for Bed Mobility Assistance: cues for sequence, THP and use of R LE to self assist Transfers Transfers: Stand to Sit;Sit to Stand Sit to Stand: 3: Mod assist;From bed;With upper extremity assist;From elevated surface Stand to Sit: 3: Mod assist;To chair/3-in-1;With armrests Details for Transfer Assistance: cues for LE management, use of UEs to self assist and THP Ambulation/Gait Ambulation/Gait Assistance: 4: Min assist;3: Mod assist Ambulation Distance (Feet): 188 Feet Assistive device: Rolling walker Ambulation/Gait Assistance Details: cues for posture, position from RW, stride length and ER on L Gait Pattern: Step-to pattern;Decreased step length - left;Decreased stance time - left;Decreased stance time - right;Step-through pattern    Exercises Total Joint Exercises Ankle Circles/Pumps: AROM;20 reps;Supine;Both Quad  Sets: AROM;15 reps;Both;Supine Gluteal Sets: AROM;15 reps;Supine;Both Heel Slides: AAROM;15 reps;Supine;Left Hip ABduction/ADduction: AAROM;15 reps;Left;Supine   PT Diagnosis:    PT Problem List:   PT Treatment Interventions:     PT Goals Acute Rehab PT Goals PT Goal Formulation: With patient/family Time For Goal Achievement: 11/20/12 Potential to Achieve Goals: Good Pt will go Supine/Side to Sit: with min assist PT Goal: Supine/Side to Sit - Progress: Progressing toward goal Pt will go Sit to Supine/Side: with min assist PT Goal: Sit to Supine/Side - Progress: Progressing toward goal Pt will go Sit to Stand: with min assist PT Goal: Sit to Stand - Progress: Progressing toward goal Pt will go Stand to Sit: with min assist PT Goal: Stand to Sit - Progress: Progressing toward goal Pt will Ambulate: 51 - 150 feet;with min assist;with rolling walker PT Goal: Ambulate - Progress: Progressing toward goal Pt will Perform Home Exercise Program: with min assist PT Goal: Perform Home Exercise Program - Progress: Progressing toward goal  Visit Information  Last PT Received On: 11/08/12 Assistance Needed: +1    Subjective Data  Subjective: I sure would like to get my pants on Patient Stated Goal:  to go to rehab.   Cognition  Cognition Arousal/Alertness: Awake/alert Behavior During Therapy: WFL for tasks assessed/performed Overall Cognitive Status: Within Functional Limits for tasks assessed    Balance     End of Session PT - End of Session Equipment Utilized During Treatment: Gait belt Activity Tolerance: Patient tolerated treatment well Patient left: in chair;with call bell/phone within reach Nurse Communication: Mobility status   GP     Andrew Norton 11/08/2012, 12:42 PM

## 2012-11-08 NOTE — Progress Notes (Signed)
Per MD, Pt ready for d/c.  Notified RN, Pt, family and facility.  Per facility, they have everything they need to receive Pt.  Arranged for transportation.  Providence Crosby, LCSWA Clinical Social Work [phone]

## 2012-11-08 NOTE — Progress Notes (Signed)
CBG: 53  Treatment: 15 GM carbohydrate snack (Orange Juice)  Symptoms: None  Follow-up CBG: Time: 0844 CBG Result: 69  Possible Reasons for Event: Unknown   Comments: Pt has no symptoms. Repeat blood sugar still slightly low, but patient has just received breakfast. Will continue to monitor patient for symptoms and will recheck sugar after breakfast.

## 2012-11-08 NOTE — Progress Notes (Signed)
Per MD, Pt ready for d/c.  Notified RN, Pt and facility.  Pt to notify his wife.  Per Scarlette Calico, RN supervisor at facility, facility ready to receive Pt.  Arranged for transportation.  Pt to be d/c'd.  Providence Crosby, LCSWA Clinical Social Work [phone]

## 2012-11-08 NOTE — Progress Notes (Signed)
   Subjective: 3 Days Post-Op Procedure(s) (LRB): Conversion of a Bipolar to a Left Total Hip Arthroplasty (Left) Patient reports pain as mild.   Patient seen in rounds with Dr. Darrelyn Hillock. Patient is well, and has had no acute complaints or problems. Patient reports that he is doing well with therapy.No issues overnight. No complaints of shortness of breath or chest pain  Plan is to go Skilled nursing facility after hospital stay.  Objective: Vital signs in last 24 hours: Temp:  [97.4 F (36.3 C)-97.8 F (36.6 C)] 97.8 F (36.6 C) (04/26 0600) Pulse Rate:  [74-79] 74 (04/26 0600) Resp:  [16-18] 16 (04/26 0759) BP: (132-178)/(62-75) 132/62 mmHg (04/26 0600) SpO2:  [96 %-98 %] 96 % (04/26 0600)  Intake/Output from previous day:  Intake/Output Summary (Last 24 hours) at 11/08/12 0948 Last data filed at 11/08/12 0800  Gross per 24 hour  Intake 1262.5 ml  Output   1075 ml  Net  187.5 ml    Intake/Output this shift: Total I/O In: -  Out: 225 [Urine:225]  Labs:  Recent Labs  11/06/12 0502 11/07/12 0442 11/08/12 0457  HGB 9.1* 8.7* 9.2*    Recent Labs  11/07/12 0442 11/08/12 0457  WBC 9.9 10.0  RBC 2.82* 2.99*  HCT 24.2* 26.1*  PLT 196 213    Recent Labs  11/06/12 0502 11/07/12 0442  NA 134* 131*  K 4.6 4.2  CL 102 98  CO2 28 26  BUN 12 13  CREATININE 0.74 0.69  GLUCOSE 163* 186*  CALCIUM 8.2* 8.5    EXAM General - Patient is Alert and Oriented Extremity - Neurologically intact Intact pulses distally Dorsiflexion/Plantar flexion intact Dressing/Incision - clean, dry Motor Function - intact, moving foot and toes well on exam.   Past Medical History  Diagnosis Date  . Anemia   . Diabetes mellitus   . Hyperlipidemia   . Renal insufficiency   . Paget's disease     scrotum  . Positive PPD, treated   . Hypertension     EKG, chest x ray 11/15/10 EPIC,  clearance Dr Abran Cantor on chart/ pt states on meds to "prevent hypertension from diabetes"  . Carpal  tunnel syndrome     left leg sciatica  . Easy bruising   . Stroke     SPINAL CORD STROKE PER OV note 3/13  DR LOVE CLEARANCE NOTE_  ON CHART WITH RECOMMENDATIONS-   has weakness  upper extremities  . Arthritis     hips  . Hearing loss     left ear greater loss  . Cancer     skin can lesions, and non cancer skin lesions-follwed by Dr. Margo Aye  . History of shingles     past hx. left hand    Assessment/Plan: 3 Days Post-Op Procedure(s) (LRB): Conversion of a Bipolar to a Left Total Hip Arthroplasty (Left) Principal Problem:   Pain with hip hemiarthroplasty  Estimated body mass index is 23.51 kg/(m^2) as calculated from the following:   Height as of this encounter: 6' (1.829 m).   Weight as of this encounter: 78.642 kg (173 lb 6 oz). Advance diet Up with therapy D/C IV fluids Discharge to SNF  DVT Prophylaxis - Xarelto Weight Bearing As Tolerated left Leg  DC to SNF today. Follow up in office in 2 weeks.   Ralph Benavidez LAUREN 11/08/2012, 9:48 AM

## 2012-11-10 ENCOUNTER — Other Ambulatory Visit: Payer: Self-pay | Admitting: *Deleted

## 2012-11-10 MED ORDER — OXYCODONE HCL 5 MG PO TABS: ORAL_TABLET | ORAL | Status: DC

## 2012-11-11 ENCOUNTER — Non-Acute Institutional Stay (SKILLED_NURSING_FACILITY): Payer: Medicare Other | Admitting: Internal Medicine

## 2012-11-11 DIAGNOSIS — G609 Hereditary and idiopathic neuropathy, unspecified: Secondary | ICD-10-CM | POA: Diagnosis not present

## 2012-11-11 DIAGNOSIS — E1149 Type 2 diabetes mellitus with other diabetic neurological complication: Secondary | ICD-10-CM

## 2012-11-11 DIAGNOSIS — M1610 Unilateral primary osteoarthritis, unspecified hip: Secondary | ICD-10-CM

## 2012-11-11 DIAGNOSIS — D62 Acute posthemorrhagic anemia: Secondary | ICD-10-CM

## 2012-11-20 ENCOUNTER — Non-Acute Institutional Stay (SKILLED_NURSING_FACILITY): Payer: Medicare Other | Admitting: Adult Health

## 2012-11-20 ENCOUNTER — Encounter: Payer: Self-pay | Admitting: Adult Health

## 2012-11-20 DIAGNOSIS — E538 Deficiency of other specified B group vitamins: Secondary | ICD-10-CM

## 2012-11-20 DIAGNOSIS — E785 Hyperlipidemia, unspecified: Secondary | ICD-10-CM | POA: Diagnosis not present

## 2012-11-20 DIAGNOSIS — I10 Essential (primary) hypertension: Secondary | ICD-10-CM | POA: Diagnosis not present

## 2012-11-20 DIAGNOSIS — M545 Low back pain, unspecified: Secondary | ICD-10-CM

## 2012-11-20 DIAGNOSIS — T889XXS Complication of surgical and medical care, unspecified, sequela: Secondary | ICD-10-CM

## 2012-11-20 DIAGNOSIS — T8484XS Pain due to internal orthopedic prosthetic devices, implants and grafts, sequela: Secondary | ICD-10-CM

## 2012-11-20 DIAGNOSIS — E119 Type 2 diabetes mellitus without complications: Secondary | ICD-10-CM

## 2012-11-20 NOTE — Progress Notes (Signed)
  Subjective:    Patient ID: Andrew Norton, male    DOB: 1927-01-12, 79 y.o.   MRN: [id number]  HPI This is an 79 year old male who is for discharge home with Home health Nursing, PT, and OT. He has been admitted to [facility] on 11/08/12 from [facility] with failed hemiarthroplasty left hip S/P conversion of left total hip arthroplasty. He has been admitted for a short-term rehabilitation.    Review of Systems  Constitutional: Negative.   HENT: Negative.   Eyes: Negative.   Respiratory: Negative.   Cardiovascular: Negative for leg swelling.  Gastrointestinal: Negative.   Endocrine: Negative.   Genitourinary: Negative.   Neurological: Negative.   Hematological: Negative for adenopathy. Does not bruise/bleed easily.  Psychiatric/Behavioral: Negative.        Objective:   Physical Exam  Nursing note and vitals reviewed. Constitutional: He is oriented to person, place, and time. He appears well-developed and well-nourished.  HENT:  Head: Normocephalic.  Right Ear: External ear normal.  Left Ear: External ear normal.  Eyes: Conjunctivae are normal. Pupils are equal, round, and reactive to light.  Neck: Normal range of motion. Neck supple.  Cardiovascular: Normal rate, regular rhythm and normal heart sounds.   Pulmonary/Chest: Effort normal and breath sounds normal.  Abdominal: Soft. Bowel sounds are normal.  Musculoskeletal: Normal range of motion. He exhibits no edema and no tenderness.  Neurological: He is alert and oriented to person, place, and time.  Skin: Skin is warm and dry.  Psychiatric: He has a normal mood and affect. His behavior is normal. Judgment and thought content normal.   Medications reviewed per [facility]       Assessment & Plan:  Pain with hip hemiarthroplasty S/P Conversion of left total hip arthroplasty - for Home health Nursing, PT and OT   VITAMIN B12 DEFICIENCY  -  Continue supplementation  BACK PAIN, LUMBAR - stable  HYPERTENSION  -   Well-controlled  HYPERLIPIDEMIA - stable  DIABETES MELLITUS, TYPE II - well-controlled

## 2012-11-21 ENCOUNTER — Telehealth: Payer: Self-pay | Admitting: Family Medicine

## 2012-11-21 NOTE — Telephone Encounter (Signed)
Pt aware/kh 

## 2012-11-21 NOTE — Telephone Encounter (Signed)
Pt was discharged from rehab today and prior to that he was in the hospital. Pt would like to come in Monday mid morning to go over new meds, ect. Pt said he really needs to see you Monday. 8:30 is too early. Only same day appts for a 30 available. Is it ok to work him in then?

## 2012-11-21 NOTE — Telephone Encounter (Signed)
OK to work in, thanks

## 2012-11-22 DIAGNOSIS — E119 Type 2 diabetes mellitus without complications: Secondary | ICD-10-CM | POA: Diagnosis not present

## 2012-11-22 DIAGNOSIS — Z96649 Presence of unspecified artificial hip joint: Secondary | ICD-10-CM | POA: Diagnosis not present

## 2012-11-22 DIAGNOSIS — D649 Anemia, unspecified: Secondary | ICD-10-CM | POA: Diagnosis not present

## 2012-11-22 DIAGNOSIS — Z471 Aftercare following joint replacement surgery: Secondary | ICD-10-CM | POA: Diagnosis not present

## 2012-11-22 DIAGNOSIS — I129 Hypertensive chronic kidney disease with stage 1 through stage 4 chronic kidney disease, or unspecified chronic kidney disease: Secondary | ICD-10-CM | POA: Diagnosis not present

## 2012-11-24 ENCOUNTER — Ambulatory Visit (INDEPENDENT_AMBULATORY_CARE_PROVIDER_SITE_OTHER): Payer: Medicare Other | Admitting: Family Medicine

## 2012-11-24 ENCOUNTER — Encounter: Payer: Self-pay | Admitting: Family Medicine

## 2012-11-24 VITALS — BP 120/58 | HR 78 | Temp 97.7°F | Wt 166.0 lb

## 2012-11-24 DIAGNOSIS — E538 Deficiency of other specified B group vitamins: Secondary | ICD-10-CM

## 2012-11-24 DIAGNOSIS — I129 Hypertensive chronic kidney disease with stage 1 through stage 4 chronic kidney disease, or unspecified chronic kidney disease: Secondary | ICD-10-CM | POA: Diagnosis not present

## 2012-11-24 DIAGNOSIS — M25559 Pain in unspecified hip: Secondary | ICD-10-CM | POA: Diagnosis not present

## 2012-11-24 DIAGNOSIS — D649 Anemia, unspecified: Secondary | ICD-10-CM | POA: Diagnosis not present

## 2012-11-24 DIAGNOSIS — I10 Essential (primary) hypertension: Secondary | ICD-10-CM

## 2012-11-24 DIAGNOSIS — G8929 Other chronic pain: Secondary | ICD-10-CM

## 2012-11-24 DIAGNOSIS — M25552 Pain in left hip: Secondary | ICD-10-CM

## 2012-11-24 DIAGNOSIS — Z96649 Presence of unspecified artificial hip joint: Secondary | ICD-10-CM | POA: Diagnosis not present

## 2012-11-24 DIAGNOSIS — Z471 Aftercare following joint replacement surgery: Secondary | ICD-10-CM | POA: Diagnosis not present

## 2012-11-24 DIAGNOSIS — E119 Type 2 diabetes mellitus without complications: Secondary | ICD-10-CM | POA: Diagnosis not present

## 2012-11-24 MED ORDER — ASPIRIN 81 MG PO CHEW: 81.0000 mg | CHEWABLE_TABLET | Freq: Every day | ORAL | Status: DC

## 2012-11-24 MED ORDER — CYANOCOBALAMIN 1000 MCG/ML IJ SOLN
1000.0000 ug | Freq: Once | INTRAMUSCULAR | Status: AC
  Administered 2012-11-24: 1000 ug via INTRAMUSCULAR

## 2012-11-24 NOTE — Progress Notes (Signed)
  Subjective:    Patient ID: Andrew Norton, male    DOB: 20-Dec-1926, 79 y.o.   MRN: [id number]  HPI Here to follow up after a left total hip replacement on 11-05-12 per Dr. Lequita Halt. He then had a rehab stay at [facility] from 11-08-12 to 11-21-12. He has done quite well, and he no longer has the severe hip pain that he had prior to the surgery. He will begin twice weekly PT and OT later this week. He had been on Xarelto for DVT prophylaxis, and he just ran out of this. He is tired and his appetite had been poor, but it is now picking up again. He has not had a B12 shot in several months.    Review of Systems  Constitutional: Negative.   Respiratory: Negative.   Cardiovascular: Negative.   Gastrointestinal: Negative.   Neurological: Negative.        Objective:   Physical Exam  Constitutional: He is oriented to person, place, and time. He appears well-developed and well-nourished.  Walking quite well with his walker   Cardiovascular: Normal rate, regular rhythm, normal heart sounds and intact distal pulses.   Pulmonary/Chest: Effort normal and breath sounds normal.  Musculoskeletal: He exhibits no edema and no tenderness.  Neurological: He is alert and oriented to person, place, and time.          Assessment & Plan:  He has done well with his recovery. Continue therapy.  He will resume daily aspirin now that the Xarelto is finished. Given a B12 shot today. We will plan on giving B12 shots weekly for 4 weeks, then check a level.

## 2012-11-25 DIAGNOSIS — E119 Type 2 diabetes mellitus without complications: Secondary | ICD-10-CM | POA: Diagnosis not present

## 2012-11-25 DIAGNOSIS — D649 Anemia, unspecified: Secondary | ICD-10-CM | POA: Diagnosis not present

## 2012-11-25 DIAGNOSIS — I129 Hypertensive chronic kidney disease with stage 1 through stage 4 chronic kidney disease, or unspecified chronic kidney disease: Secondary | ICD-10-CM | POA: Diagnosis not present

## 2012-11-25 DIAGNOSIS — Z471 Aftercare following joint replacement surgery: Secondary | ICD-10-CM | POA: Diagnosis not present

## 2012-11-25 DIAGNOSIS — Z96649 Presence of unspecified artificial hip joint: Secondary | ICD-10-CM | POA: Diagnosis not present

## 2012-11-27 DIAGNOSIS — I129 Hypertensive chronic kidney disease with stage 1 through stage 4 chronic kidney disease, or unspecified chronic kidney disease: Secondary | ICD-10-CM | POA: Diagnosis not present

## 2012-11-27 DIAGNOSIS — M1610 Unilateral primary osteoarthritis, unspecified hip: Secondary | ICD-10-CM | POA: Insufficient documentation

## 2012-11-27 DIAGNOSIS — E119 Type 2 diabetes mellitus without complications: Secondary | ICD-10-CM | POA: Diagnosis not present

## 2012-11-27 DIAGNOSIS — Z96649 Presence of unspecified artificial hip joint: Secondary | ICD-10-CM | POA: Diagnosis not present

## 2012-11-27 DIAGNOSIS — Z471 Aftercare following joint replacement surgery: Secondary | ICD-10-CM | POA: Diagnosis not present

## 2012-11-27 DIAGNOSIS — G609 Hereditary and idiopathic neuropathy, unspecified: Secondary | ICD-10-CM | POA: Insufficient documentation

## 2012-11-27 DIAGNOSIS — D649 Anemia, unspecified: Secondary | ICD-10-CM | POA: Diagnosis not present

## 2012-11-27 NOTE — Progress Notes (Signed)
Patient ID: Andrew Norton, male   DOB: 09-25-1926, 79 y.o.   MRN: [id number]        HISTORY & PHYSICAL  DATE: 11/11/2012   FACILITY: [facility]  LEVEL OF CARE: SNF (31)  ALLERGIES:  Allergies  Allergen Reactions  . Penicillins Hives    CHIEF COMPLAINT:  Manage left hip osteoarthritis, diabetes mellitus and peripheral neuropathy.     HISTORY OF PRESENT ILLNESS:  The patient is an 79 year-old, Caucasian male.    HIP OSTEOARTHRITIS: Patient had a history of hemiarthroplasty with failure.  X-rays revealed evidence of osteophytes and joint space narrowing.   patient had advanced end stage OA of the hip with progressively worsening pain & dysfunction.  Pt failed non-surgical conservative management.  Therefore pt underwent total hip arthroplasty & tolerated the procedure well.  Pt denies hip pain currently.  Pt was admitted to this facility for short term rehabilitation.   DM:pt's DM remains stable.  Pt denies polyuria, polydipsia, polyphagia, changes in vision or hypoglycemic episodes.  No complications noted from the medication presently being used.  Last hemoglobin A1c is: A recent  hemoglobin A1C is not available.   PERIPHERAL NEUROPATHY: The peripheral neuropathy is stable. The patient denies pain in the feet, tingling, and numbness. No complications noted from the medication presently being used.   PAST MEDICAL HISTORY :  Past Medical History  Diagnosis Date  . Anemia   . Diabetes mellitus   . Hyperlipidemia   . Renal insufficiency   . Paget's disease     scrotum  . Positive PPD, treated   . Hypertension     EKG, chest x ray 11/15/10 EPIC,  clearance Dr Abran Cantor on chart/ pt states on meds to "prevent hypertension from diabetes"  . Carpal tunnel syndrome     left leg sciatica  . Easy bruising   . Stroke     SPINAL CORD STROKE PER OV note 3/13  DR LOVE CLEARANCE NOTE_  ON CHART WITH RECOMMENDATIONS-   has weakness  upper extremities  . Arthritis     hips  .  Hearing loss     left ear greater loss  . Cancer     skin can lesions, and non cancer skin lesions-follwed by Dr. Margo Aye  . History of shingles     past hx. left hand    PAST SURGICAL HISTORY: Past Surgical History  Procedure Laterality Date  . Tonsillectomy    . Partial removal of scrotum    . Cervical fusion      dr [name] 2009- retained hardware  . Hip fracture surgery  11-24-10    left hip per Dr, Malon Kindle  . Colonoscopy  2-08    per Dr. Randa Evens, normal   . Eye surgery      bilateral cataract extraction with IOL  . Lumbar laminectomy/decompression microdiscectomy  11/07/2011    Procedure: LUMBAR LAMINECTOMY/DECOMPRESSION MICRODISCECTOMY;  Surgeon: Javier Docker, MD;  Location: [facility];  Service: Orthopedics;  Laterality: N/A;  Decompression of the L4 - L5 Central (X-ray)   . Total hip revision Left 11/05/2012    Procedure: Conversion of a Bipolar to a Left Total Hip Arthroplasty;  Surgeon: Loanne Drilling, MD;  Location: [facility];  Service: Orthopedics;  Laterality: Left;  Conversion of a Bipolar to a Left Total Hip Arthroplasty    SOCIAL HISTORY:  reports that he has never smoked. He has never used smokeless tobacco. He reports that he does not drink  alcohol or use illicit drugs.  FAMILY HISTORY:  Family History  Problem Relation Age of Onset  . Tuberculosis      CURRENT MEDICATIONS: Reviewed per [facility]  REVIEW OF SYSTEMS:  See HPI otherwise 14 point ROS is negative.  PHYSICAL EXAMINATION  VS:  T 97.3       P 93      RR 18      BP 140/70       POX 91% room air        WT (Lb)  GENERAL: no acute distress, normal body habitus SKIN: left hip incision clean and dry, Steri-Strips in place, warm & dry, no suspicious lesions or rashes, no excessive dryness EYES: conjunctivae normal, sclerae normal, normal eye lids MOUTH/THROAT: lips without lesions,no lesions in the mouth,tongue is without lesions,uvula elevates in midline NECK: supple, trachea midline, no neck masses, no  thyroid tenderness, no thyromegaly LYMPHATICS: no LAN in the neck, no supraclavicular LAN RESPIRATORY: breathing is even & unlabored, BS CTAB CARDIAC: RRR, no murmur,no extra heart sounds EDEMA/VARICOSITIES:  +1 bilateral lower extremity edema  ARTERIAL:  pedal pulses +1  GI:  ABDOMEN: abdomen soft, normal BS, no masses, no tenderness  LIVER/SPLEEN: no hepatomegaly, no splenomegaly MUSCULOSKELETAL: HEAD: normal to inspection & palpation BACK: no kyphosis, scoliosis or spinal processes tenderness EXTREMITIES: LEFT UPPER EXTREMITY: full range of motion, normal strength & tone RIGHT UPPER EXTREMITY:  full range of motion, normal strength & tone LEFT LOWER EXTREMITY: strength intact, range of motion not tested due to surgery  RIGHT LOWER EXTREMITY:  full range of motion, normal strength & tone PSYCHIATRIC: the patient is alert & oriented to person, affect & behavior appropriate  LABS/RADIOLOGY: Urinalysis negative.   PT 14.1, INR 1.1, PTT 35.  Glucose 185, otherwise CMP normal.    Hemoglobin 10.7, MCV  89.9, otherwise CBC normal.    Chest x-ray:  No acute disease.   Left hip x-ray showed left hip hemiarthroplasty without acute abnormality.    Left hip x-ray postsurgically showed left hip arthroplasty revision.    ASSESSMENT/PLAN:  Left hip osteoarthritis.  Status post total hip arthroplasty secondary to failure of hemiarthroplasty.  Continue rehabilitation.  Diabetes mellitus with neuropathy.  Check  hemoglobin A1C.   Peripheral neuropathy.  Well controlled.   Acute blood loss anemia.  Reassess hemoglobin level.   Hypertension.  Well controlled.   Constipation.  Well controlled.   Check CBC and BMP.   I have reviewed patient's medical records received at admission/from hospitalization.  CPT CODE: 40981

## 2012-11-28 DIAGNOSIS — Z96649 Presence of unspecified artificial hip joint: Secondary | ICD-10-CM | POA: Diagnosis not present

## 2012-11-28 DIAGNOSIS — D649 Anemia, unspecified: Secondary | ICD-10-CM | POA: Diagnosis not present

## 2012-11-28 DIAGNOSIS — I129 Hypertensive chronic kidney disease with stage 1 through stage 4 chronic kidney disease, or unspecified chronic kidney disease: Secondary | ICD-10-CM | POA: Diagnosis not present

## 2012-11-28 DIAGNOSIS — Z471 Aftercare following joint replacement surgery: Secondary | ICD-10-CM | POA: Diagnosis not present

## 2012-11-28 DIAGNOSIS — E119 Type 2 diabetes mellitus without complications: Secondary | ICD-10-CM | POA: Diagnosis not present

## 2012-12-01 DIAGNOSIS — E119 Type 2 diabetes mellitus without complications: Secondary | ICD-10-CM | POA: Diagnosis not present

## 2012-12-01 DIAGNOSIS — I129 Hypertensive chronic kidney disease with stage 1 through stage 4 chronic kidney disease, or unspecified chronic kidney disease: Secondary | ICD-10-CM | POA: Diagnosis not present

## 2012-12-01 DIAGNOSIS — Z96649 Presence of unspecified artificial hip joint: Secondary | ICD-10-CM | POA: Diagnosis not present

## 2012-12-01 DIAGNOSIS — D649 Anemia, unspecified: Secondary | ICD-10-CM | POA: Diagnosis not present

## 2012-12-01 DIAGNOSIS — Z471 Aftercare following joint replacement surgery: Secondary | ICD-10-CM | POA: Diagnosis not present

## 2012-12-02 DIAGNOSIS — D649 Anemia, unspecified: Secondary | ICD-10-CM | POA: Diagnosis not present

## 2012-12-02 DIAGNOSIS — Z471 Aftercare following joint replacement surgery: Secondary | ICD-10-CM | POA: Diagnosis not present

## 2012-12-02 DIAGNOSIS — I129 Hypertensive chronic kidney disease with stage 1 through stage 4 chronic kidney disease, or unspecified chronic kidney disease: Secondary | ICD-10-CM | POA: Diagnosis not present

## 2012-12-02 DIAGNOSIS — E119 Type 2 diabetes mellitus without complications: Secondary | ICD-10-CM | POA: Diagnosis not present

## 2012-12-02 DIAGNOSIS — Z96649 Presence of unspecified artificial hip joint: Secondary | ICD-10-CM | POA: Diagnosis not present

## 2012-12-03 DIAGNOSIS — Z96649 Presence of unspecified artificial hip joint: Secondary | ICD-10-CM | POA: Diagnosis not present

## 2012-12-03 DIAGNOSIS — I129 Hypertensive chronic kidney disease with stage 1 through stage 4 chronic kidney disease, or unspecified chronic kidney disease: Secondary | ICD-10-CM | POA: Diagnosis not present

## 2012-12-03 DIAGNOSIS — D649 Anemia, unspecified: Secondary | ICD-10-CM | POA: Diagnosis not present

## 2012-12-03 DIAGNOSIS — Z471 Aftercare following joint replacement surgery: Secondary | ICD-10-CM | POA: Diagnosis not present

## 2012-12-03 DIAGNOSIS — E119 Type 2 diabetes mellitus without complications: Secondary | ICD-10-CM | POA: Diagnosis not present

## 2012-12-10 DIAGNOSIS — I129 Hypertensive chronic kidney disease with stage 1 through stage 4 chronic kidney disease, or unspecified chronic kidney disease: Secondary | ICD-10-CM | POA: Diagnosis not present

## 2012-12-10 DIAGNOSIS — E119 Type 2 diabetes mellitus without complications: Secondary | ICD-10-CM | POA: Diagnosis not present

## 2012-12-10 DIAGNOSIS — D649 Anemia, unspecified: Secondary | ICD-10-CM | POA: Diagnosis not present

## 2012-12-10 DIAGNOSIS — Z96649 Presence of unspecified artificial hip joint: Secondary | ICD-10-CM | POA: Diagnosis not present

## 2012-12-10 DIAGNOSIS — Z471 Aftercare following joint replacement surgery: Secondary | ICD-10-CM | POA: Diagnosis not present

## 2012-12-15 DIAGNOSIS — Z471 Aftercare following joint replacement surgery: Secondary | ICD-10-CM | POA: Diagnosis not present

## 2012-12-15 DIAGNOSIS — Z96649 Presence of unspecified artificial hip joint: Secondary | ICD-10-CM | POA: Diagnosis not present

## 2012-12-15 DIAGNOSIS — I129 Hypertensive chronic kidney disease with stage 1 through stage 4 chronic kidney disease, or unspecified chronic kidney disease: Secondary | ICD-10-CM | POA: Diagnosis not present

## 2012-12-15 DIAGNOSIS — D649 Anemia, unspecified: Secondary | ICD-10-CM | POA: Diagnosis not present

## 2012-12-15 DIAGNOSIS — E119 Type 2 diabetes mellitus without complications: Secondary | ICD-10-CM | POA: Diagnosis not present

## 2012-12-16 ENCOUNTER — Other Ambulatory Visit: Payer: Self-pay | Admitting: Family Medicine

## 2012-12-16 DIAGNOSIS — Z96649 Presence of unspecified artificial hip joint: Secondary | ICD-10-CM | POA: Diagnosis not present

## 2012-12-18 DIAGNOSIS — I129 Hypertensive chronic kidney disease with stage 1 through stage 4 chronic kidney disease, or unspecified chronic kidney disease: Secondary | ICD-10-CM | POA: Diagnosis not present

## 2012-12-18 DIAGNOSIS — E119 Type 2 diabetes mellitus without complications: Secondary | ICD-10-CM | POA: Diagnosis not present

## 2012-12-18 DIAGNOSIS — Z96649 Presence of unspecified artificial hip joint: Secondary | ICD-10-CM | POA: Diagnosis not present

## 2012-12-18 DIAGNOSIS — Z471 Aftercare following joint replacement surgery: Secondary | ICD-10-CM | POA: Diagnosis not present

## 2012-12-18 DIAGNOSIS — D649 Anemia, unspecified: Secondary | ICD-10-CM | POA: Diagnosis not present

## 2012-12-19 ENCOUNTER — Ambulatory Visit (INDEPENDENT_AMBULATORY_CARE_PROVIDER_SITE_OTHER): Payer: Medicare Other | Admitting: Neurology

## 2012-12-19 ENCOUNTER — Encounter: Payer: Self-pay | Admitting: Neurology

## 2012-12-19 VITALS — BP 130/58 | HR 77 | Temp 97.0°F | Ht 68.5 in | Wt 167.0 lb

## 2012-12-19 DIAGNOSIS — M48061 Spinal stenosis, lumbar region without neurogenic claudication: Secondary | ICD-10-CM | POA: Diagnosis not present

## 2012-12-19 DIAGNOSIS — G609 Hereditary and idiopathic neuropathy, unspecified: Secondary | ICD-10-CM

## 2012-12-19 DIAGNOSIS — M25559 Pain in unspecified hip: Secondary | ICD-10-CM | POA: Diagnosis not present

## 2012-12-19 DIAGNOSIS — G8929 Other chronic pain: Secondary | ICD-10-CM

## 2012-12-19 DIAGNOSIS — M4712 Other spondylosis with myelopathy, cervical region: Secondary | ICD-10-CM | POA: Diagnosis not present

## 2012-12-19 NOTE — Progress Notes (Signed)
Subjective:    Patient ID: Andrew Norton is a 79 y.o. male.  HPI  Interim history:   Andrew Norton is a 79 year old right-handed gentleman who presents for follow-up consultation of his hip and leg pain. He is accompanied by his wife today. This is his first visit with me and he previously followed with Dr. Fayrene Fearing love and was last seen by him on 08/13/2012, and which time Dr. Sandria Manly felt that his symptoms were do to orthopedic problems. He did order an EMG and nerve conduction test to look for left L5 radiculopathy but he felt that his local left leg weakness including in the hip flexor and hip extensor were do to help related issues.He had EMG and nerve conduction studies on 08/15/2012: Nerve conduction studies performed on both lower extremities show evidence of a primarily axonal peripheral neuropathy of moderate severity. EMG evaluation of the left lower extremity shows mild distal acute and chronic denervation consistent with the diagnosis of peripheral neuropathy. Mild denervation of the lumbosacral paraspinal muscles was seen, and the possibility of an overlying S1 radiculopathy is suggested, but diagnostic certainty is lessened by the presence of the peripheral neuropathy. Dr. love called him back with the results explaining that his findings were in keeping with diabetic neuropathy and less likely due to radiculopathy.   He has an underlying medical history of diabetes, shingles on the right upper extremity, memory loss, tremor, depression, right lumbar radiculopathy with status post 2 epidural injections and nerve blocks. He is status post anterior cervical discectomy with fusion in October 2009 complicated by quadriparesis. He had lumbar decompression surgery in April 2013 and left hip replacement in May 2012 as well as bilateral cataract surgeries. He is currently on hydrocodone, Lidoderm patch, Flexeril, glipizide, metformin, Neurontin and milligram 3 times a day and 300 mg at night, simvastatin,  MiraLax, B12 injections monthly, baby aspirin, ferrous sulfate, losartan.  I reviewed Dr. Imagene Gurney prior notes and the patient's records and below is a summary for review:  79 year old right-handed gentleman with a history of severe cervical spinal stenosis causing cord compression status post surgery in October 2009 and multilevel fusion with allograft, bone graft and plate. He developed postoperatively cardiac arrhythmias and myelopathy with slow improvement of his weakness, gait disorder and dysphagia. He had burning pain as well. He has paresthesias. He has been on gabapentin. He has had bladder urgency and rare bowel incontinence. MRI C-spine from January 2011 showed status post surgery and hyperintensity in the cord at C5-6 compatible with chronic myelomalacia. MRI from September 2011 showed similar findings but question of cord edema. While in rehabilitation he fell and fractured his left hip with open reduction and internal fixation in May 2012. CT myelogram in December 2012 showedspinal stenosis. He has received physical therapy and had lumbar decompression surgery in April 2013.  He had L THR in 4/14. He has done fairly well, he was in inpt rehab and has Home Health therapy today and will start outpt therapy soon. Since his neck surgery in 10/09 he has had tingling in his hands.    His Past Medical History Is Significant For: Past Medical History  Diagnosis Date  . Anemia   . Diabetes mellitus   . Hyperlipidemia   . Renal insufficiency   . Paget's disease     scrotum  . Positive PPD, treated   . Hypertension     EKG, chest x ray 11/15/10 [facility],  clearance Dr Abran Cantor on chart/ pt states on meds  to "prevent hypertension from diabetes"  . Carpal tunnel syndrome     left leg sciatica  . Easy bruising   . Stroke     SPINAL CORD STROKE PER OV note 3/13  DR LOVE CLEARANCE NOTE_  ON CHART WITH RECOMMENDATIONS-   has weakness  upper extremities  . Arthritis     hips  . Hearing loss     left ear  greater loss  . Cancer     skin can lesions, and non cancer skin lesions-follwed by Dr. Margo Aye  . History of shingles     past hx. left hand    His Past Surgical History Is Significant For: Past Surgical History  Procedure Laterality Date  . Tonsillectomy    . Partial removal of scrotum    . Cervical fusion      dr [name] 2009- retained hardware  . Hip fracture surgery  11-24-10    left hip per Dr, Malon Kindle  . Colonoscopy  2-08    per Dr. Randa Evens, normal   . Eye surgery      bilateral cataract extraction with IOL  . Lumbar laminectomy/decompression microdiscectomy  11/07/2011    Procedure: LUMBAR LAMINECTOMY/DECOMPRESSION MICRODISCECTOMY;  Surgeon: Javier Docker, MD;  Location: [facility];  Service: Orthopedics;  Laterality: N/A;  Decompression of the L4 - L5 Central (X-ray)   . Total hip revision Left 11/05/2012    Procedure: Conversion of a Bipolar to a Left Total Hip Arthroplasty;  Surgeon: Loanne Drilling, MD;  Location: [facility];  Service: Orthopedics;  Laterality: Left;  Conversion of a Bipolar to a Left Total Hip Arthroplasty    His Family History Is Significant For: Family History  Problem Relation Age of Onset  . Tuberculosis      His Social History Is Significant For: History   Social History  . Marital Status: Married    Spouse Name: N/A    Number of Children: N/A  . Years of Education: N/A   Social History Main Topics  . Smoking status: Never Smoker   . Smokeless tobacco: Never Used  . Alcohol Use: No  . Drug Use: No  . Sexually Active: No   Other Topics Concern  . None   Social History Narrative  . None    His Allergies Are:  Allergies  Allergen Reactions  . Penicillins Hives  :   His Current Medications Are:  Outpatient Encounter Prescriptions as of 12/19/2012  Medication Sig Dispense Refill  . aspirin 81 MG chewable tablet Chew 1 tablet (81 mg total) by mouth daily.  1 tablet  0  . bisacodyl (DULCOLAX) 10 MG suppository Place 1 suppository (10  mg total) rectally daily as needed.  12 suppository  0  . clobetasol cream (TEMOVATE) 0.05 %       . cyanocobalamin (,VITAMIN B-12,) 1000 MCG/ML injection Inject 1,000 mcg into the muscle every 30 (thirty) days.      . finasteride (PROSCAR) 5 MG tablet Take 1 tablet (5 mg total) by mouth daily.  90 tablet  3  . gabapentin (NEURONTIN) 100 MG capsule Take 3 capsules (300 mg total) by mouth 4 (four) times daily.  12 capsule  0  . glipiZIDE (GLUCOTROL) 10 MG tablet Take 1 tablet (10 mg total) by mouth 2 (two) times daily before a meal.  180 tablet  3  . HYDROcodone-acetaminophen (NORCO/VICODIN) 5-325 MG per tablet       . lidocaine (LIDODERM) 5 % APPLY 1 PATCH DAILY REMOVE AND  DISCARD PATCH WITHIN 12HOURS OR AS DIRECTED  90 patch  0  . losartan (COZAAR) 50 MG tablet Take 1 tablet (50 mg total) by mouth every morning.  90 tablet  3  . metFORMIN (GLUCOPHAGE) 500 MG tablet Take 500 mg by mouth 2 (two) times daily with a meal.      . methocarbamol (ROBAXIN) 500 MG tablet Take 1 tablet (500 mg total) by mouth every 6 (six) hours as needed.  60 tablet  1  . polyethylene glycol powder (GLYCOLAX/MIRALAX) powder Take 17 g by mouth daily.       . simvastatin (ZOCOR) 40 MG tablet Take 40 mg by mouth every evening.      . traMADol (ULTRAM) 50 MG tablet Take 1-2 tablets (50-100 mg total) by mouth every 6 (six) hours as needed.  80 tablet  1  . [DISCONTINUED] lidocaine (LIDODERM) 5 % Place 1 patch onto the skin daily. Applies to lower back      . [DISCONTINUED] oxyCODONE (OXY IR/ROXICODONE) 5 MG immediate release tablet Take 1 tablet every 8 hours 6 AM, 2 PM, and 10 PM, Take 1-2 tablets every 3 hours as needed  330 tablet  0   No facility-administered encounter medications on file as of 12/19/2012.    Review of Systems  HENT: Positive for hearing loss.        Ringing in Ear    Objective:  Neurologic Exam  Physical Exam Physical Examination:   Filed Vitals:   12/19/12 1148  BP: 130/58  Pulse: 77  Temp:  97 F (36.1 C)    General Examination: The patient is a very pleasant 79 y.o. male in no acute distress. He appears well-developed and well-nourished and well groomed.   HEENT: Normocephalic, atraumatic, pupils are equal, round and reactive to light and accommodation. Funduscopic exam is normal with sharp disc margins noted. Extraocular tracking is good without limitation to gaze excursion or nystagmus noted. Normal smooth pursuit is noted. Hearing is grossly intact. Tympanic membranes are clear bilaterally. Face is symmetric with normal facial animation and normal facial sensation. Speech is clear with no dysarthria noted. There is no hypophonia. There is no lip, neck/head, jaw or voice tremor. Neck is supple with full range of passive and active motion. There are no carotid bruits on auscultation. Oropharynx exam reveals: mild mouth dryness, adequate dental hygiene and mild airway crowding. Mallampati is class II. Tongue protrudes centrally and palate elevates symmetrically.   Chest: Clear to auscultation without wheezing, rhonchi or crackles noted.  Heart: S1+S2+0, regular and normal without murmurs, rubs or gallops noted.   Abdomen: Soft, non-tender and non-distended with normal bowel sounds appreciated on auscultation.  Extremities: There is 1+ pitting edema in the distal lower extremities bilaterally. Pedal pulses are intact.  Skin: Warm and dry without trophic changes noted. There are no varicose veins.  Musculoskeletal: exam reveals no obvious joint deformities, tenderness or joint swelling or erythema.   Neurologically:  Mental status: The patient is awake, alert and oriented in all 4 spheres. His memory, attention, language and knowledge are appropriate. There is no aphasia, agnosia, apraxia or anomia. Speech is clear with normal prosody and enunciation. Thought process is linear. Mood is congruent and affect is normal.  Cranial nerves are as described above under HEENT exam. In  addition, shoulder shrug is normal with equal shoulder height noted. Motor exam: Normal bulk, strength and tone is noted, with slight weakness in both intrinsic finger muscles, but also some give away phenomenon. There  is no drift, tremor or rebound. Romberg is negative. Reflexes are 1+ in the UEs and absent in the LEs. Fine motor skills are fair.  Cerebellar testing shows no dysmetria or intention tremor. There is no truncal or gait ataxia.  Sensory exam is intact to light touch, pinprick, vibration, temperature sense in the UEs and with slight decrease in vibration and PP in the distal LEs.  Gait, station and balance: He stands up with mild difficulty and pushes himself up. He walks slowly with slight limp on the left with his cane. He turns in 3 steps.               Assessment and Plan:    In summary, Andrew Norton is a very pleasant 79 y.o.-year old male with a history of cervical spine disease including Hx of cervical myelopathy, lumbar spine disease and orthopedic problems, s/p recent L THR. His exam is stable and is also c/w mild peripheral neuropathy.   I had a long chat with the patient and his wife about his symptoms and my findings. I am not sure that we are offering whole lot at this time from a neurological standpoint. I suggested a ongoing followup with orthopedics and his PCP. We can do a six-month checkup with neurology and I suggested a followup with one of my associates that are more specialized with regards to neuromuscular diseases. He and his wife were in agreement. I did not make any medication changes today. He is encouraged to continue with physical therapy. He is advised to continue to use his cane.

## 2012-12-19 NOTE — Patient Instructions (Addendum)
We will arrange for a follow up in 6 months. Our nurse, Andrey Campanile, will call you for your appointment with one of my associates.

## 2012-12-22 ENCOUNTER — Encounter: Payer: Self-pay | Admitting: Neurology

## 2012-12-22 ENCOUNTER — Other Ambulatory Visit: Payer: Self-pay | Admitting: Family Medicine

## 2012-12-22 NOTE — Telephone Encounter (Signed)
Can we refill this? 

## 2012-12-23 DIAGNOSIS — M25559 Pain in unspecified hip: Secondary | ICD-10-CM | POA: Diagnosis not present

## 2012-12-24 ENCOUNTER — Ambulatory Visit (INDEPENDENT_AMBULATORY_CARE_PROVIDER_SITE_OTHER): Payer: Medicare Other | Admitting: Family Medicine

## 2012-12-24 DIAGNOSIS — E538 Deficiency of other specified B group vitamins: Secondary | ICD-10-CM | POA: Diagnosis not present

## 2012-12-24 MED ORDER — CYANOCOBALAMIN 1000 MCG/ML IJ SOLN
1000.0000 ug | Freq: Once | INTRAMUSCULAR | Status: AC
  Administered 2012-12-24: 1000 ug via INTRAMUSCULAR

## 2012-12-25 DIAGNOSIS — M25559 Pain in unspecified hip: Secondary | ICD-10-CM | POA: Diagnosis not present

## 2012-12-30 DIAGNOSIS — M25559 Pain in unspecified hip: Secondary | ICD-10-CM | POA: Diagnosis not present

## 2013-01-01 DIAGNOSIS — M25559 Pain in unspecified hip: Secondary | ICD-10-CM | POA: Diagnosis not present

## 2013-01-02 DIAGNOSIS — Z85820 Personal history of malignant melanoma of skin: Secondary | ICD-10-CM | POA: Diagnosis not present

## 2013-01-02 DIAGNOSIS — L570 Actinic keratosis: Secondary | ICD-10-CM | POA: Diagnosis not present

## 2013-01-02 DIAGNOSIS — D235 Other benign neoplasm of skin of trunk: Secondary | ICD-10-CM | POA: Diagnosis not present

## 2013-01-06 DIAGNOSIS — M25559 Pain in unspecified hip: Secondary | ICD-10-CM | POA: Diagnosis not present

## 2013-01-08 DIAGNOSIS — M25559 Pain in unspecified hip: Secondary | ICD-10-CM | POA: Diagnosis not present

## 2013-01-13 DIAGNOSIS — M25559 Pain in unspecified hip: Secondary | ICD-10-CM | POA: Diagnosis not present

## 2013-01-15 DIAGNOSIS — M25559 Pain in unspecified hip: Secondary | ICD-10-CM | POA: Diagnosis not present

## 2013-01-20 DIAGNOSIS — M25559 Pain in unspecified hip: Secondary | ICD-10-CM | POA: Diagnosis not present

## 2013-01-23 ENCOUNTER — Ambulatory Visit (INDEPENDENT_AMBULATORY_CARE_PROVIDER_SITE_OTHER): Payer: Medicare Other | Admitting: Family Medicine

## 2013-01-23 DIAGNOSIS — D649 Anemia, unspecified: Secondary | ICD-10-CM

## 2013-01-23 MED ORDER — CYANOCOBALAMIN 1000 MCG/ML IJ SOLN
1000.0000 ug | Freq: Once | INTRAMUSCULAR | Status: AC
  Administered 2013-01-23: 1000 ug via INTRAMUSCULAR

## 2013-02-03 DIAGNOSIS — M76899 Other specified enthesopathies of unspecified lower limb, excluding foot: Secondary | ICD-10-CM | POA: Diagnosis not present

## 2013-02-27 ENCOUNTER — Ambulatory Visit (INDEPENDENT_AMBULATORY_CARE_PROVIDER_SITE_OTHER): Payer: Medicare Other | Admitting: Family Medicine

## 2013-02-27 DIAGNOSIS — E538 Deficiency of other specified B group vitamins: Secondary | ICD-10-CM | POA: Diagnosis not present

## 2013-02-27 MED ORDER — CYANOCOBALAMIN 1000 MCG/ML IJ SOLN
1000.0000 ug | Freq: Once | INTRAMUSCULAR | Status: AC
  Administered 2013-02-27: 1000 ug via INTRAMUSCULAR

## 2013-03-16 ENCOUNTER — Other Ambulatory Visit: Payer: Self-pay | Admitting: Family Medicine

## 2013-03-20 ENCOUNTER — Other Ambulatory Visit: Payer: Self-pay | Admitting: Family Medicine

## 2013-03-24 ENCOUNTER — Ambulatory Visit (INDEPENDENT_AMBULATORY_CARE_PROVIDER_SITE_OTHER): Payer: Medicare Other | Admitting: Family Medicine

## 2013-03-24 DIAGNOSIS — Z23 Encounter for immunization: Secondary | ICD-10-CM | POA: Diagnosis not present

## 2013-03-24 DIAGNOSIS — E538 Deficiency of other specified B group vitamins: Secondary | ICD-10-CM | POA: Diagnosis not present

## 2013-03-24 MED ORDER — CYANOCOBALAMIN 1000 MCG/ML IJ SOLN
1000.0000 ug | Freq: Once | INTRAMUSCULAR | Status: AC
  Administered 2013-03-24: 1000 ug via INTRAMUSCULAR

## 2013-04-15 ENCOUNTER — Telehealth: Payer: Self-pay | Admitting: Family Medicine

## 2013-04-15 MED ORDER — METFORMIN HCL 500 MG PO TABS: 500.0000 mg | ORAL_TABLET | Freq: Two times a day (BID) | ORAL | Status: DC

## 2013-04-15 NOTE — Telephone Encounter (Signed)
I sent script e-scribe to [facility] and I spoke with pt.  

## 2013-04-15 NOTE — Telephone Encounter (Signed)
Pt states he is completely out of metFORMIN (GLUCOPHAGE) 500 MG tablet.  [facility] has informed the pt they have requested a refill twice with no response.  Since pt is out of medication he is requesting a 90 day refill sent to [facility] [employer]  Please notify pt when this has been done.

## 2013-04-18 ENCOUNTER — Other Ambulatory Visit: Payer: Self-pay | Admitting: Family Medicine

## 2013-04-27 IMAGING — CR DG CHEST 2V
2 series · 2 of 2 positions shown · non-contrast
Comparison: PA and lateral chest 11/15/2010.

CLINICAL DATA: Preoperative respiratory films.

CHEST - 2 VIEW

[w chest pa]
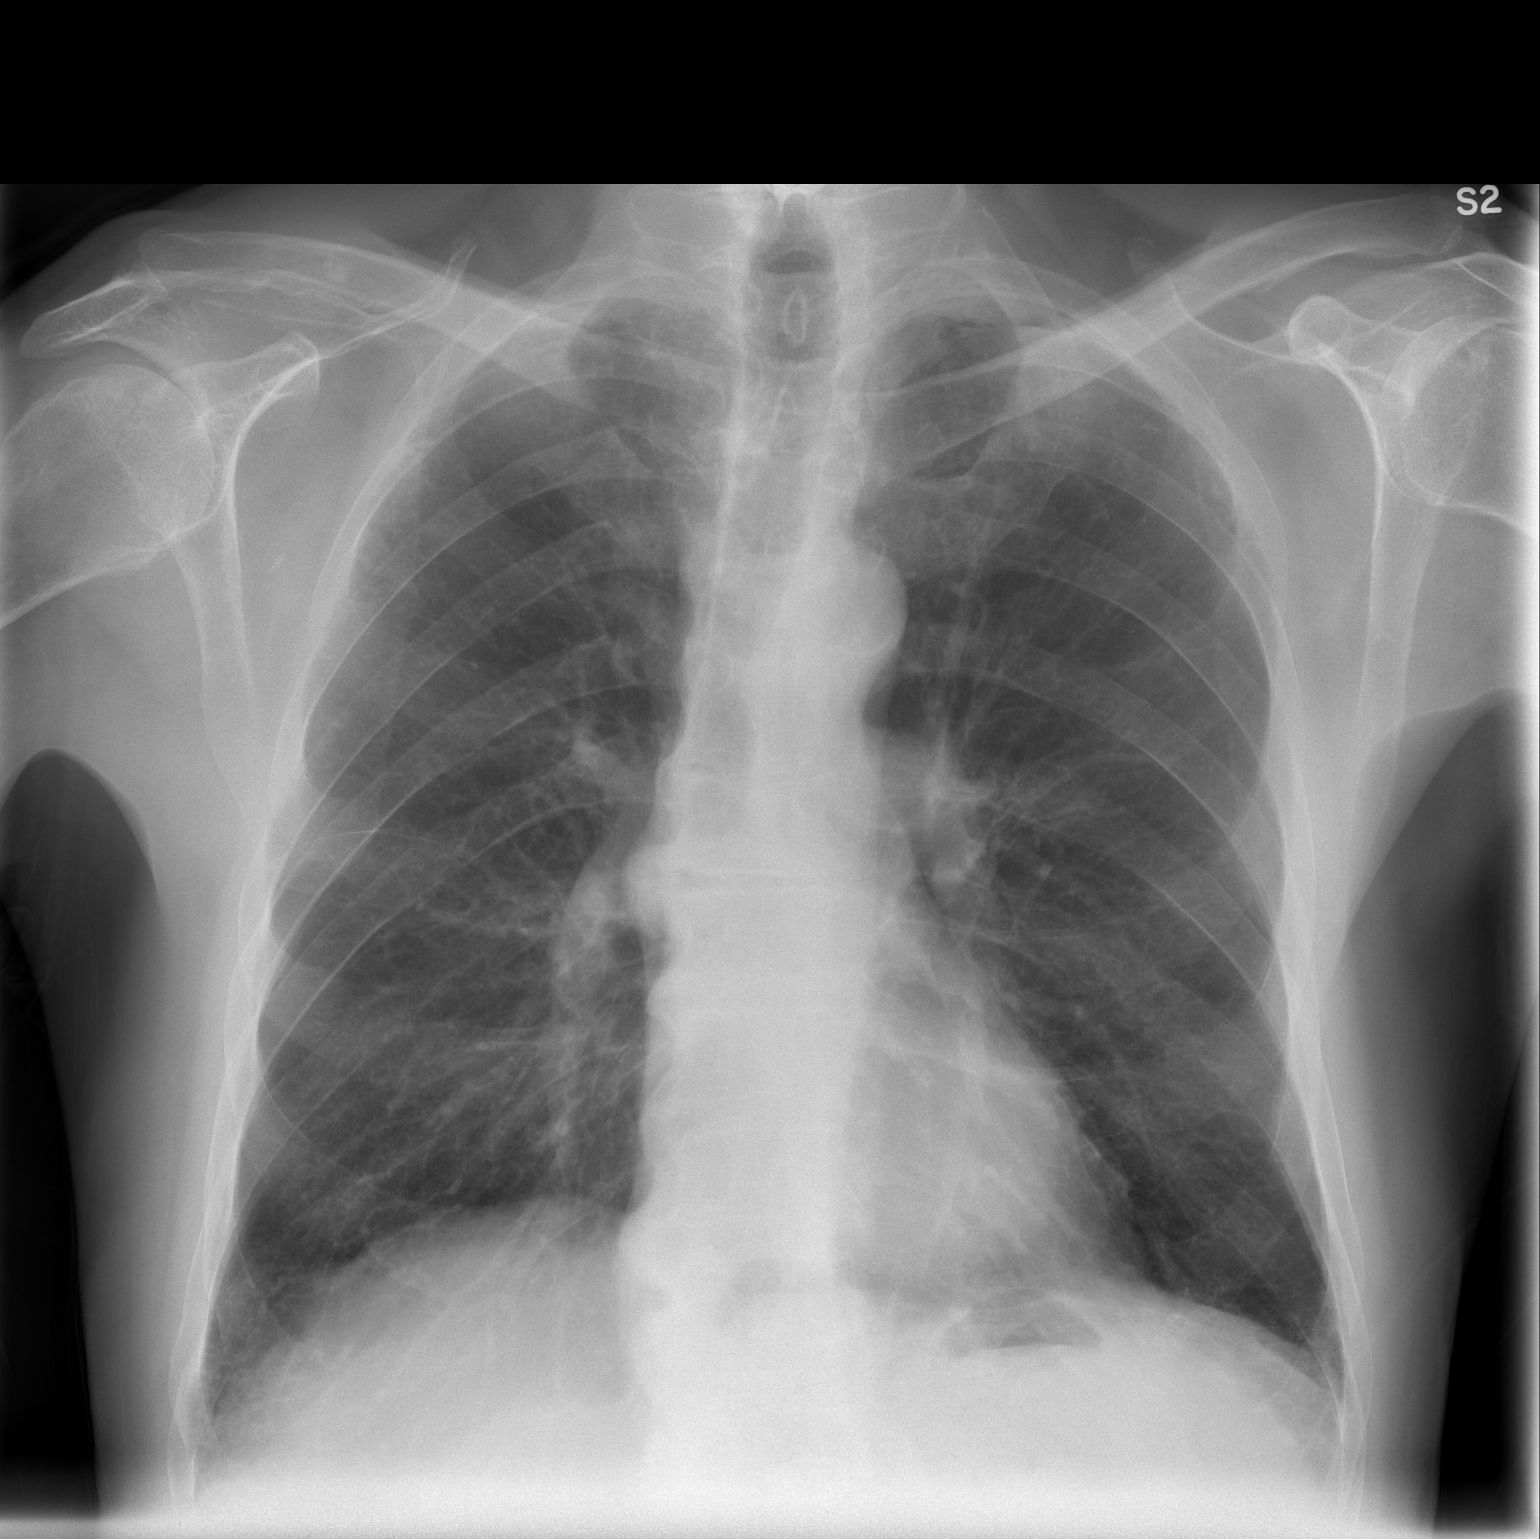

[w chest lat]
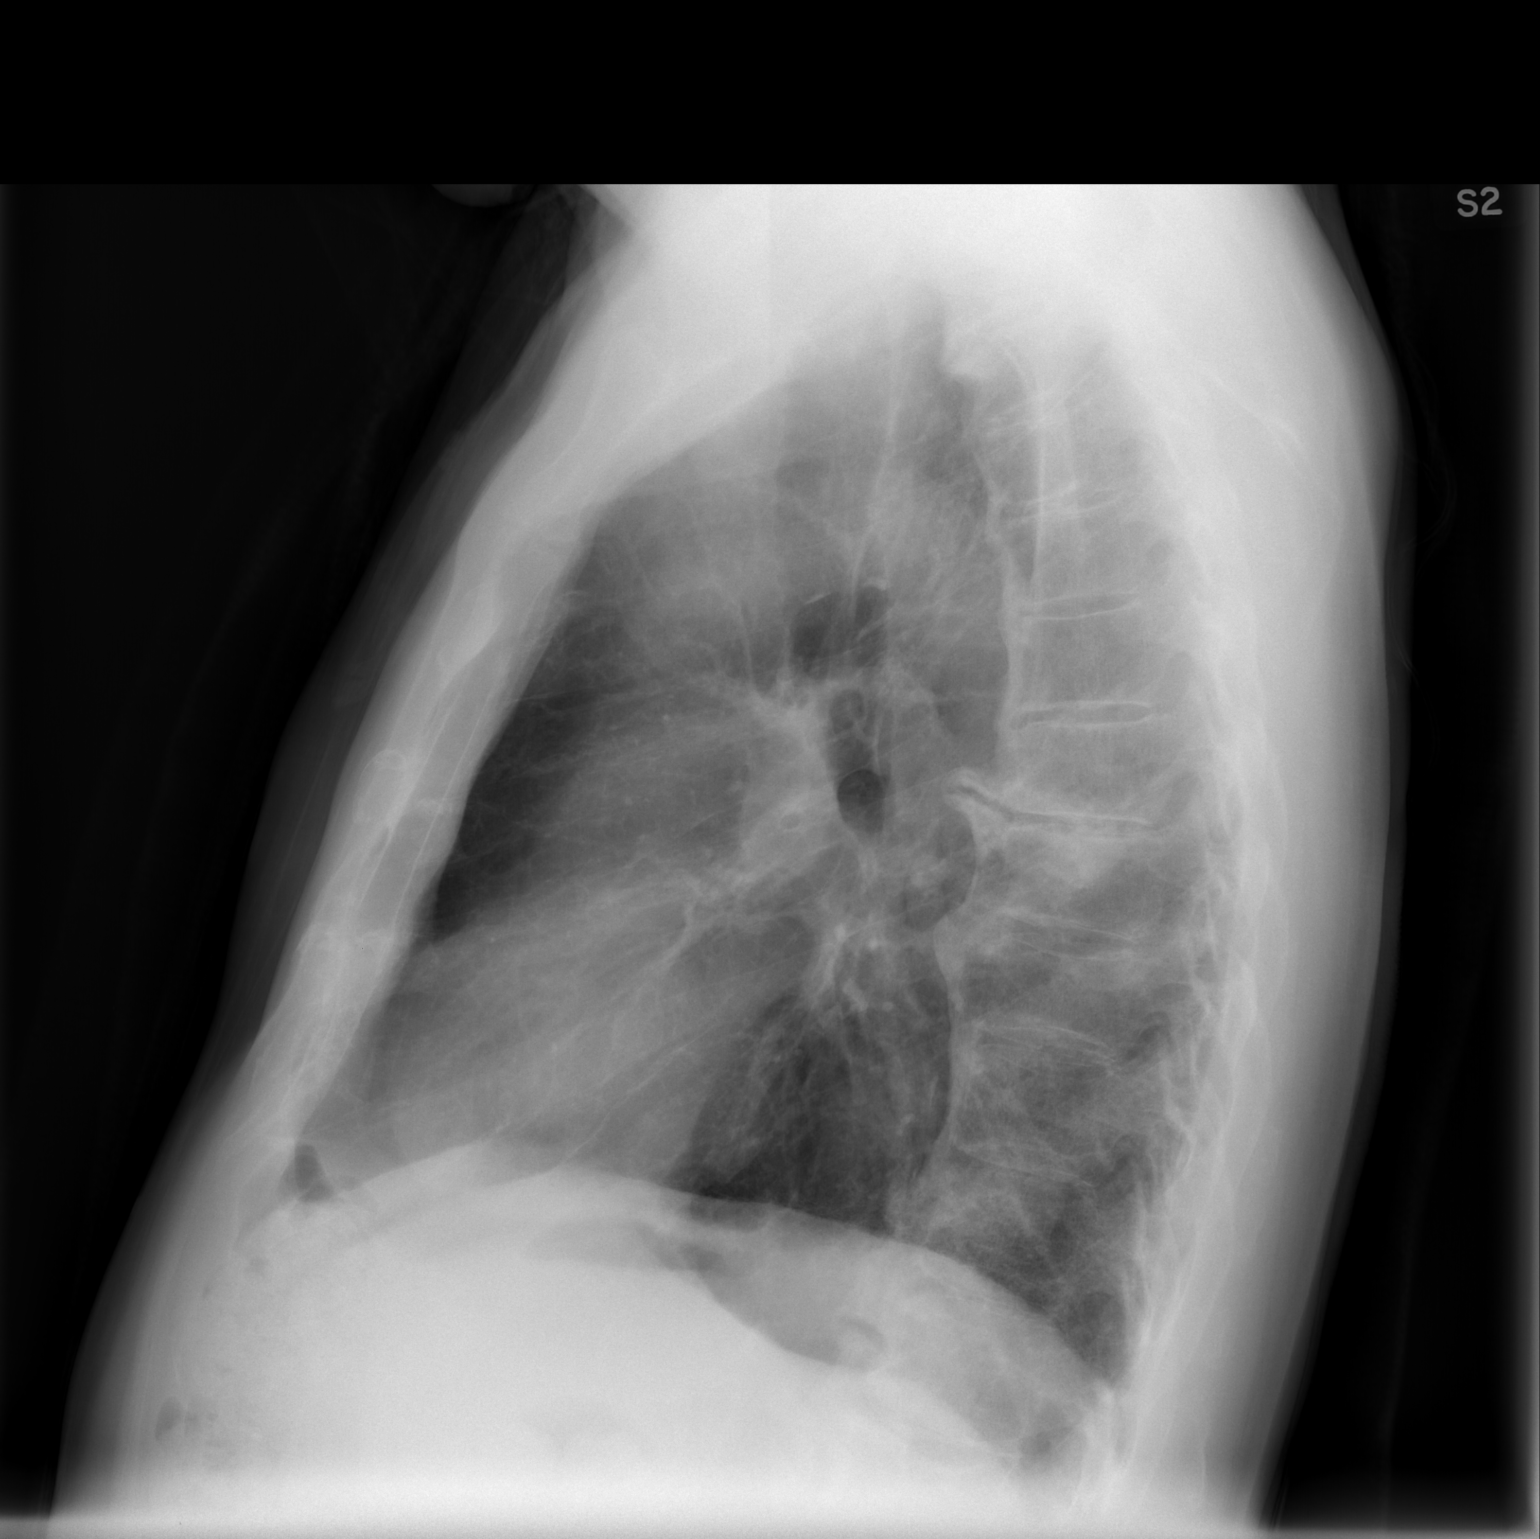

[2 of 2 positions shown; findings below may reference images not displayed]

FINDINGS: There is unchanged mild scarring in the lung bases.
Lungs otherwise clear.  Heart size is normal.  No pneumothorax or
pleural effusion.
IMPRESSION: No acute disease.  No compared to prior exam.

## 2013-04-28 ENCOUNTER — Encounter: Payer: Self-pay | Admitting: Family Medicine

## 2013-04-28 ENCOUNTER — Ambulatory Visit (INDEPENDENT_AMBULATORY_CARE_PROVIDER_SITE_OTHER): Payer: Medicare Other | Admitting: Family Medicine

## 2013-04-28 VITALS — BP 130/80 | HR 67 | Temp 98.0°F | Wt 167.0 lb

## 2013-04-28 DIAGNOSIS — E538 Deficiency of other specified B group vitamins: Secondary | ICD-10-CM

## 2013-04-28 DIAGNOSIS — K5900 Constipation, unspecified: Secondary | ICD-10-CM | POA: Diagnosis not present

## 2013-04-28 MED ORDER — CYANOCOBALAMIN 1000 MCG/ML IJ SOLN
1000.0000 ug | Freq: Once | INTRAMUSCULAR | Status: AC
  Administered 2013-04-28: 1000 ug via INTRAMUSCULAR

## 2013-04-28 NOTE — Progress Notes (Signed)
  Subjective:    Patient ID: Andrew Norton, male    DOB: 1926/12/01, 79 y.o.   MRN: [id number]  HPI Here for constipation. He takes Miralax once a day but he admits to drinking very little water. He averages 1-2 BMs a week. No nausea or abdominal pain.   Review of Systems  Constitutional: Negative.   Gastrointestinal: Positive for constipation. Negative for nausea, vomiting, abdominal pain, diarrhea, blood in stool, abdominal distention, anal bleeding and rectal pain.       Objective:   Physical Exam  Constitutional: He appears well-developed and well-nourished. No distress.  Pulmonary/Chest: Effort normal and breath sounds normal.  Abdominal: Soft. Bowel sounds are normal. He exhibits no distension and no mass. There is no tenderness. There is no rebound and no guarding.          Assessment & Plan:  Increase Miralax to bid and drink more water. Add MOM prn

## 2013-05-04 DIAGNOSIS — L820 Inflamed seborrheic keratosis: Secondary | ICD-10-CM | POA: Diagnosis not present

## 2013-05-04 DIAGNOSIS — L570 Actinic keratosis: Secondary | ICD-10-CM | POA: Diagnosis not present

## 2013-05-07 ENCOUNTER — Telehealth: Payer: Self-pay | Admitting: Family Medicine

## 2013-05-07 MED ORDER — HYDROCODONE-ACETAMINOPHEN 5-325 MG PO TABS: 1.0000 | ORAL_TABLET | Freq: Two times a day (BID) | ORAL | Status: DC

## 2013-05-07 NOTE — Telephone Encounter (Signed)
Pt needs new hydrocodone °

## 2013-05-07 NOTE — Telephone Encounter (Signed)
done

## 2013-05-08 NOTE — Telephone Encounter (Signed)
Script is ready and pt is here to pick up. 

## 2013-05-18 ENCOUNTER — Other Ambulatory Visit: Payer: Self-pay | Admitting: Family Medicine

## 2013-05-19 DIAGNOSIS — M76899 Other specified enthesopathies of unspecified lower limb, excluding foot: Secondary | ICD-10-CM | POA: Diagnosis not present

## 2013-05-29 ENCOUNTER — Encounter: Payer: Self-pay | Admitting: *Deleted

## 2013-06-01 ENCOUNTER — Encounter: Payer: Self-pay | Admitting: Family Medicine

## 2013-06-01 ENCOUNTER — Ambulatory Visit (INDEPENDENT_AMBULATORY_CARE_PROVIDER_SITE_OTHER): Payer: Medicare Other | Admitting: Family Medicine

## 2013-06-01 VITALS — BP 144/70 | HR 90 | Temp 97.8°F | Ht 70.5 in | Wt 168.0 lb

## 2013-06-01 DIAGNOSIS — E119 Type 2 diabetes mellitus without complications: Secondary | ICD-10-CM | POA: Diagnosis not present

## 2013-06-01 DIAGNOSIS — I10 Essential (primary) hypertension: Secondary | ICD-10-CM

## 2013-06-01 DIAGNOSIS — N401 Enlarged prostate with lower urinary tract symptoms: Secondary | ICD-10-CM | POA: Diagnosis not present

## 2013-06-01 DIAGNOSIS — E538 Deficiency of other specified B group vitamins: Secondary | ICD-10-CM | POA: Diagnosis not present

## 2013-06-01 DIAGNOSIS — N139 Obstructive and reflux uropathy, unspecified: Secondary | ICD-10-CM | POA: Diagnosis not present

## 2013-06-01 DIAGNOSIS — N138 Other obstructive and reflux uropathy: Secondary | ICD-10-CM

## 2013-06-01 DIAGNOSIS — E785 Hyperlipidemia, unspecified: Secondary | ICD-10-CM

## 2013-06-01 DIAGNOSIS — N259 Disorder resulting from impaired renal tubular function, unspecified: Secondary | ICD-10-CM

## 2013-06-01 DIAGNOSIS — M542 Cervicalgia: Secondary | ICD-10-CM

## 2013-06-01 LAB — CBC WITH DIFFERENTIAL/PLATELET
Basophils Absolute: 0 10*3/uL (ref 0.0–0.1)
Eosinophils Absolute: 0.3 10*3/uL (ref 0.0–0.7)
HCT: 31.4 % — ABNORMAL LOW (ref 39.0–52.0)
Hemoglobin: 10.6 g/dL — ABNORMAL LOW (ref 13.0–17.0)
Lymphs Abs: 1.1 10*3/uL (ref 0.7–4.0)
MCHC: 33.8 g/dL (ref 30.0–36.0)
MCV: 91.1 fl (ref 78.0–100.0)
Monocytes Absolute: 0.5 10*3/uL (ref 0.1–1.0)
Monocytes Relative: 9.3 % (ref 3.0–12.0)
Neutro Abs: 3.8 10*3/uL (ref 1.4–7.7)
RDW: 14.2 % (ref 11.5–14.6)
WBC: 5.8 10*3/uL (ref 4.5–10.5)

## 2013-06-01 LAB — LIPID PANEL
Cholesterol: 91 mg/dL (ref 0–200)
LDL Cholesterol: 34 mg/dL (ref 0–99)
Total CHOL/HDL Ratio: 2
Triglycerides: 78 mg/dL (ref 0.0–149.0)

## 2013-06-01 LAB — BASIC METABOLIC PANEL
BUN: 15 mg/dL (ref 6–23)
CO2: 26 mEq/L (ref 19–32)
Chloride: 106 mEq/L (ref 96–112)
Creatinine, Ser: 0.8 mg/dL (ref 0.4–1.5)
Potassium: 5.8 mEq/L — ABNORMAL HIGH (ref 3.5–5.1)

## 2013-06-01 LAB — TSH: TSH: 1.95 u[IU]/mL (ref 0.35–5.50)

## 2013-06-01 LAB — PSA: PSA: 0.51 ng/mL (ref 0.10–4.00)

## 2013-06-01 LAB — HEPATIC FUNCTION PANEL
ALT: 16 U/L (ref 0–53)
AST: 22 U/L (ref 0–37)
Albumin: 4.2 g/dL (ref 3.5–5.2)
Bilirubin, Direct: 0.1 mg/dL (ref 0.0–0.3)
Total Protein: 6.8 g/dL (ref 6.0–8.3)

## 2013-06-01 MED ORDER — GABAPENTIN 100 MG PO CAPS: 300.0000 mg | ORAL_CAPSULE | Freq: Four times a day (QID) | ORAL | Status: DC

## 2013-06-01 MED ORDER — GLIPIZIDE 10 MG PO TABS: 10.0000 mg | ORAL_TABLET | Freq: Two times a day (BID) | ORAL | Status: DC

## 2013-06-01 MED ORDER — CYANOCOBALAMIN 1000 MCG/ML IJ SOLN
1000.0000 ug | Freq: Once | INTRAMUSCULAR | Status: AC
  Administered 2013-06-01: 1000 ug via INTRAMUSCULAR

## 2013-06-01 MED ORDER — SIMVASTATIN 40 MG PO TABS: 40.0000 mg | ORAL_TABLET | Freq: Every evening | ORAL | Status: DC

## 2013-06-01 MED ORDER — LOSARTAN POTASSIUM 50 MG PO TABS: 50.0000 mg | ORAL_TABLET | Freq: Every morning | ORAL | Status: DC

## 2013-06-01 MED ORDER — FINASTERIDE 5 MG PO TABS: 5.0000 mg | ORAL_TABLET | Freq: Every day | ORAL | Status: DC

## 2013-06-01 MED ORDER — METFORMIN HCL 500 MG PO TABS: 500.0000 mg | ORAL_TABLET | Freq: Two times a day (BID) | ORAL | Status: DC

## 2013-06-01 NOTE — Progress Notes (Signed)
Pre visit review using our clinic review tool, if applicable. No additional management support is needed unless otherwise documented below in the visit note. 

## 2013-06-01 NOTE — Progress Notes (Signed)
  Subjective:    Patient ID: Andrew Norton, male    DOB: Apr 27, 1927, 79 y.o.   MRN: [id number]  HPI 79 yr old male for a cpx. He is doing well in general but he does have some sharp pains that radiate down the left leg. He recently had a follow up with Dr. Lequita Halt after his hip surgery, and this was doing well. Dr. Lequita Halt thought the pain was coming from his spine, so he referred him back to Dr. Shelle Iron to check this.    Review of Systems  Constitutional: Negative.   HENT: Negative.   Eyes: Negative.   Respiratory: Negative.   Cardiovascular: Negative.   Gastrointestinal: Negative.   Genitourinary: Negative.   Musculoskeletal: Negative.   Skin: Negative.   Neurological: Negative.   Psychiatric/Behavioral: Negative.        Objective:   Physical Exam  Constitutional: He is oriented to person, place, and time. He appears well-developed and well-nourished. No distress.  HENT:  Head: Normocephalic and atraumatic.  Right Ear: External ear normal.  Left Ear: External ear normal.  Nose: Nose normal.  Mouth/Throat: Oropharynx is clear and moist. No oropharyngeal exudate.  Eyes: Conjunctivae and EOM are normal. Pupils are equal, round, and reactive to light. Right eye exhibits no discharge. Left eye exhibits no discharge. No scleral icterus.  Neck: Neck supple. No JVD present. No tracheal deviation present. No thyromegaly present.  Cardiovascular: Normal rate, regular rhythm, normal heart sounds and intact distal pulses.  Exam reveals no gallop and no friction rub.   No murmur heard. EKG normal   Pulmonary/Chest: Effort normal and breath sounds normal. No respiratory distress. He has no wheezes. He has no rales. He exhibits no tenderness.  Abdominal: Soft. Bowel sounds are normal. He exhibits no distension and no mass. There is no tenderness. There is no rebound and no guarding.  Genitourinary: Rectum normal, prostate normal and penis normal. Guaiac negative stool. No penile tenderness.   Musculoskeletal: Normal range of motion. He exhibits no edema and no tenderness.  Lymphadenopathy:    He has no cervical adenopathy.  Neurological: He is alert and oriented to person, place, and time. He has normal reflexes. No cranial nerve deficit. He exhibits normal muscle tone. Coordination normal.  Skin: Skin is warm and dry. No rash noted. He is not diaphoretic. No erythema. No pallor.  Psychiatric: He has a normal mood and affect. His behavior is normal. Judgment and thought content normal.          Assessment & Plan:  Well exam. Get fasting labs

## 2013-06-08 ENCOUNTER — Other Ambulatory Visit: Payer: Self-pay | Admitting: Family Medicine

## 2013-06-08 DIAGNOSIS — M48061 Spinal stenosis, lumbar region without neurogenic claudication: Secondary | ICD-10-CM | POA: Diagnosis not present

## 2013-06-08 DIAGNOSIS — M5137 Other intervertebral disc degeneration, lumbosacral region: Secondary | ICD-10-CM | POA: Diagnosis not present

## 2013-06-24 ENCOUNTER — Ambulatory Visit (INDEPENDENT_AMBULATORY_CARE_PROVIDER_SITE_OTHER): Payer: Medicare Other | Admitting: Family Medicine

## 2013-06-24 DIAGNOSIS — E538 Deficiency of other specified B group vitamins: Secondary | ICD-10-CM | POA: Diagnosis not present

## 2013-06-24 MED ORDER — CYANOCOBALAMIN 1000 MCG/ML IJ SOLN
1000.0000 ug | Freq: Once | INTRAMUSCULAR | Status: AC
  Administered 2013-06-24: 1000 ug via INTRAMUSCULAR

## 2013-06-27 ENCOUNTER — Other Ambulatory Visit: Payer: Self-pay | Admitting: Family Medicine

## 2013-06-29 ENCOUNTER — Encounter: Payer: Self-pay | Admitting: Neurology

## 2013-07-01 ENCOUNTER — Encounter (INDEPENDENT_AMBULATORY_CARE_PROVIDER_SITE_OTHER): Payer: Self-pay

## 2013-07-01 ENCOUNTER — Encounter: Payer: Self-pay | Admitting: Neurology

## 2013-07-01 ENCOUNTER — Ambulatory Visit (INDEPENDENT_AMBULATORY_CARE_PROVIDER_SITE_OTHER): Payer: Medicare Other | Admitting: Neurology

## 2013-07-01 VITALS — BP 144/64 | HR 87 | Ht 71.0 in | Wt 171.0 lb

## 2013-07-01 DIAGNOSIS — R269 Unspecified abnormalities of gait and mobility: Secondary | ICD-10-CM | POA: Insufficient documentation

## 2013-07-01 DIAGNOSIS — M5010 Cervical disc disorder with radiculopathy, unspecified cervical region: Secondary | ICD-10-CM

## 2013-07-01 DIAGNOSIS — M5412 Radiculopathy, cervical region: Secondary | ICD-10-CM | POA: Diagnosis not present

## 2013-07-01 MED ORDER — DULOXETINE HCL 20 MG PO CPEP: 20.0000 mg | ORAL_CAPSULE | Freq: Every day | ORAL | Status: DC

## 2013-07-02 NOTE — Progress Notes (Signed)
[facility]  HISTORICAL Andrew Norton is 79 years old right-handed Caucasian male, accompanied by his wife, primary care physician is Dr. Gershon Crane, he was a previous patient of Dr. love, last clinical visit was August 13 2012  He had a past medical history of diabetes, shingles involving right C7 dermatome, mild memory loss, essential tremor   He had a history of acute on chronic cervical cord compression at decompression surgery October 2009, by neurosurgeon Dr. [name], anterior cervical decompression, fusion at C3-4, C4-5, C5-6, with allograft, and anterior cervical plate, post surgically, he developed cardiac arrhythmia, myelopathy, with profound  left arm, leg weakness, had slow recovery,  Since the surgery, he continues to notice burning and numbness in both hands, left greater than right, mild gait difficulty,   He was treated with gabapentin 300 mg for his neuropathic pain involving left arm, and leg  He also suffered bladder urgency, occasionally bladder spasm, bowel incontinence,  Past surgical MRI cervical January 2011 showed ACDF at C3, 4, C4-5, C5-6, interbody anterior plate and screw, hypodensity in the cord at C5-6, that is cystic, compatible with chronic myelomalacia, at C6 and 7, there is progression of central disc protrusion, disc osteophyte complex on the left, moderate  large extruded disc fragment at T1-2, extending cranially  He had a repeat cervical MRI September 2011, which showed similar findings, but also questionable cord edema vs. myelomalacia at C4-5,  He also complained of severe right lower extremity shooting pain, fell, and broken his left hip, had open reduction internal fixation 2011 by Dr.Allusio,  He had worsening low back pain, CT myelogram December 2012 showed mild multifactorial spinal stenosis at L1-2, bilateral foraminal stenosis, mild multifactorial spinal stenosis at L3, moderate disc bulging at L4-5, with bilateral subarticular  recess, and foraminal stenosis, right greater than left, compression deformity of L1 and 2.  He eventually underwent lumbar decompression in April 2013 by Dr. Jillyn Hidden,  Today he complains almost constant and mild to moderate left hand and left leg neuropathic pain, despite Neurontin treatment, mild worsening gait difficulty, and he is taking hydrocodone 5/325 mg twice a day, receiving epidural injection by Dr. Ethelene Hal  REVIEW OF SYSTEMS: Full 14 system review of systems performed and notable only for  low back pain, hearing loss, ringing years, shortness of breath, difficulty urination, achy muscles, muscle cramps, dizziness, numbness,  ALLERGIES: Allergies  Allergen Reactions  . Penicillins Hives    HOME MEDICATIONS: Outpatient Prescriptions Prior to Visit  Medication Sig Dispense Refill  . aspirin 81 MG chewable tablet Chew 1 tablet (81 mg total) by mouth daily.  1 tablet  0  . cyanocobalamin (,VITAMIN B-12,) 1000 MCG/ML injection Inject 1,000 mcg into the muscle every 30 (thirty) days.      . cyclobenzaprine (FLEXERIL) 10 MG tablet       . Ferrous Sulfate 220 (44 FE) MG/5ML LIQD TAKE 7 MILLILITERS BY MOUTH DAILY  473 mL  0  . finasteride (PROSCAR) 5 MG tablet Take 1 tablet (5 mg total) by mouth daily.  90 tablet  3  . gabapentin (NEURONTIN) 100 MG capsule Take 3 capsules (300 mg total) by mouth 4 (four) times daily.  1080 capsule  1  . glipiZIDE (GLUCOTROL) 10 MG tablet Take 1 tablet (10 mg total) by mouth 2 (two) times daily before a meal.  180 tablet  3  . HYDROcodone-acetaminophen (NORCO/VICODIN) 5-325 MG per tablet Take 1 tablet by mouth 2 (two) times daily.  60 tablet  0  .  lidocaine (LIDODERM) 5 % APPLY 1 PATCH DAILY, REMOVEAND DISCARD PATCH WITHIN 12HOURS OR AS DIRECTED  90 patch  0  . losartan (COZAAR) 50 MG tablet Take 1 tablet (50 mg total) by mouth every morning.  90 tablet  3  . metFORMIN (GLUCOPHAGE) 500 MG tablet Take 1 tablet (500 mg total) by mouth 2 (two) times daily with a  meal.  180 tablet  3  . polyethylene glycol powder (GLYCOLAX/MIRALAX) powder TAKE 17 GRAMS ONCE DAILY AS DIRECTED  527 g  3  . simvastatin (ZOCOR) 40 MG tablet Take 1 tablet (40 mg total) by mouth every evening.  90 tablet  3   No facility-administered medications prior to visit.    PAST MEDICAL HISTORY: Past Medical History  Diagnosis Date  . Anemia   . Diabetes mellitus   . Hyperlipidemia   . Renal insufficiency   . Paget's disease     scrotum  . Positive PPD, treated   . Hypertension     EKG, chest x ray 11/15/10 [facility],  clearance Dr Abran Cantor on chart/ pt states on meds to "prevent hypertension from diabetes"  . Carpal tunnel syndrome     left leg sciatica  . Easy bruising   . Stroke     SPINAL CORD STROKE PER OV note 3/13  DR LOVE CLEARANCE NOTE_  ON CHART WITH RECOMMENDATIONS-   has weakness  upper extremities  . Arthritis     hips  . Hearing loss     left ear greater loss  . Cancer     skin can lesions, and non cancer skin lesions-follwed by Dr. Margo Aye  . History of shingles     past hx. left hand    PAST SURGICAL HISTORY: Past Surgical History  Procedure Laterality Date  . Tonsillectomy    . Partial removal of scrotum    . Cervical fusion      dr [name] 2009- retained hardware  . Hip fracture surgery  11-24-10    left hip per Dr, Malon Kindle  . Colonoscopy  2-08    per Dr. Randa Evens, normal   . Eye surgery      bilateral cataract extraction with IOL  . Lumbar laminectomy/decompression microdiscectomy  11/07/2011    Procedure: LUMBAR LAMINECTOMY/DECOMPRESSION MICRODISCECTOMY;  Surgeon: Javier Docker, MD;  Location: [facility];  Service: Orthopedics;  Laterality: N/A;  Decompression of the L4 - L5 Central (X-ray)   . Total hip revision Left 11/05/2012    Procedure: Conversion of a Bipolar to a Left Total Hip Arthroplasty;  Surgeon: Loanne Drilling, MD;  Location: [facility];  Service: Orthopedics;  Laterality: Left;  Conversion of a Bipolar to a Left Total Hip Arthroplasty     FAMILY HISTORY: Family History  Problem Relation Age of Onset  . Tuberculosis      SOCIAL HISTORY:  History   Social History  . Marital Status: Married    Spouse Name: Myriam Jacobson    Number of Children: 3  . Years of Education: College-BS   Occupational History  . retired    Social History Main Topics  . Smoking status: Never Smoker   . Smokeless tobacco: Never Used  . Alcohol Use: No  . Drug Use: No  . Sexual Activity: No   Other Topics Concern  . Not on file   Social History Narrative   Pt lives at home with wife Myriam Jacobson)   Pt is right handed   Education college-BS   Pt states that he drinks  1 cup of coffee daily, and occ may have a cup of tea or soda     PHYSICAL EXAM   Filed Vitals:   07/01/13 1156  BP: 144/64  Pulse: 87  Height: 5\' 11"  (1.803 m)  Weight: 171 lb (77.565 kg)    Not recorded    Body mass index is 23.86 kg/(m^2).   Generalized: In no acute distress  Neck: Supple, no carotid bruits   Cardiac: Regular rate rhythm  Pulmonary: Clear to auscultation bilaterally  Musculoskeletal: No deformity  Neurological examination  Mentation: Alert oriented to time, place, history taking, and causual conversation  Cranial nerve II-XII: Pupils were equal round reactive to light extraocular movements were full, Visual field were full on confrontational test. Bilateral fundi were sharp.  Facial sensation and strength were normal. Hearing was intact to finger rubbing bilaterally. Uvula tongue midline.  head turning and shoulder shrug and were normal and symmetric.Tongue protrusion into cheek strength was normal.  Motor: he has mild left hip flexion, weakness, mild left shoulder abduction, external rotation, and the left hand grip weakness,  Sensory: length dependent decreased fine touch, pinprick to knee level,, decreased vibratory sensation to knee level  Coordination: Normal finger to nose, heel-to-shin bilaterally there was no truncal  ataxia  Gait: Rising up from seated position using cane, but pushing on a chair arm, cautious, hematology, dragging left leg some,   Romberg signs: Negative  Deep tendon reflexes: Brachioradialis 2/2, biceps 2/2, triceps 2/2, patellar 2/2, Achilles trace,, plantar responses were flexor bilaterally.   DIAGNOSTIC DATA (LABS, IMAGING, TESTING) - I reviewed patient records, labs, notes, testing and imaging myself where available.  Lab Results  Component Value Date   WBC 5.8 06/01/2013   HGB 10.6* 06/01/2013   HCT 31.4* 06/01/2013   MCV 91.1 06/01/2013   PLT 233.0 06/01/2013      Component Value Date/Time   NA 139 06/01/2013 1144   K 5.8* 06/01/2013 1144   CL 106 06/01/2013 1144   CO2 26 06/01/2013 1144   GLUCOSE 112* 06/01/2013 1144   BUN 15 06/01/2013 1144   CREATININE 0.8 06/01/2013 1144   CALCIUM 9.2 06/01/2013 1144   PROT 6.8 06/01/2013 1144   ALBUMIN 4.2 06/01/2013 1144   AST 22 06/01/2013 1144   ALT 16 06/01/2013 1144   ALKPHOS 75 06/01/2013 1144   BILITOT 0.8 06/01/2013 1144   GFRNONAA 85* 11/07/2012 0442   GFRAA >90 11/07/2012 0442   Lab Results  Component Value Date   CHOL 91 06/01/2013   HDL 41.90 06/01/2013   LDLCALC 34 06/01/2013   TRIG 78.0 06/01/2013   CHOLHDL 2 06/01/2013   Lab Results  Component Value Date   HGBA1C 7.7* 06/01/2013   Lab Results  Component Value Date   VITAMINB12 384 03/01/2011   Lab Results  Component Value Date   TSH 1.95 06/01/2013      ASSESSMENT AND PLAN   79 years old right-handed Caucasian male, with past medical history of cervical myelopathy, lumbar radiculopathy, status post decompression surgery, continued to have left arm, leg neuropathic pain, slow worsening gait difficulty, left-sided weakness,  1.  Will repeat MRI cervical spine 2. refer him to outpatient physical therapy 3 add on low dose Cymbalta 20 mg every day. 4. RTC in 2 -3 months         Levert Feinstein, M.D. Ph.D.  [facility] [address] [place], [place] [address] [phone]

## 2013-07-07 DIAGNOSIS — M5137 Other intervertebral disc degeneration, lumbosacral region: Secondary | ICD-10-CM | POA: Diagnosis not present

## 2013-07-21 ENCOUNTER — Ambulatory Visit
Admission: RE | Admit: 2013-07-21 | Discharge: 2013-07-21 | Disposition: A | Discharge status: Home / Self Care | Payer: Medicare Other | Source: Ambulatory Visit | Attending: Neurology | Admitting: Neurology

## 2013-07-21 DIAGNOSIS — M542 Cervicalgia: Secondary | ICD-10-CM | POA: Diagnosis not present

## 2013-07-21 DIAGNOSIS — R269 Unspecified abnormalities of gait and mobility: Secondary | ICD-10-CM | POA: Diagnosis not present

## 2013-07-21 DIAGNOSIS — M5010 Cervical disc disorder with radiculopathy, unspecified cervical region: Secondary | ICD-10-CM

## 2013-07-22 DIAGNOSIS — M545 Low back pain, unspecified: Secondary | ICD-10-CM | POA: Diagnosis not present

## 2013-07-22 DIAGNOSIS — M5137 Other intervertebral disc degeneration, lumbosacral region: Secondary | ICD-10-CM | POA: Diagnosis not present

## 2013-07-23 ENCOUNTER — Telehealth: Payer: Self-pay | Admitting: Neurology

## 2013-07-23 ENCOUNTER — Ambulatory Visit: Payer: Medicare Other | Attending: Neurology | Admitting: Physical Therapy

## 2013-07-23 DIAGNOSIS — Z96649 Presence of unspecified artificial hip joint: Secondary | ICD-10-CM | POA: Insufficient documentation

## 2013-07-23 DIAGNOSIS — M5412 Radiculopathy, cervical region: Secondary | ICD-10-CM | POA: Diagnosis not present

## 2013-07-23 DIAGNOSIS — R269 Unspecified abnormalities of gait and mobility: Secondary | ICD-10-CM | POA: Diagnosis not present

## 2013-07-23 DIAGNOSIS — IMO0001 Reserved for inherently not codable concepts without codable children: Secondary | ICD-10-CM | POA: Diagnosis not present

## 2013-07-23 NOTE — Telephone Encounter (Signed)
I have called him, MRI of cervical spine showed bone fusion and metal hardware from C3-C6 levels.  Spinal cord atrophy and gliosis at C6 level.  C6-7: disc bulging, uncovertebral joint hypertrophy, and facet hypertrophy with moderate spinal stenosis and severe biforaminal foraminal stenosis.  C3-4, C4-5 and C5-6 there is severe biforaminal foraminal stenosis.   At T1-2: disc protrusion with superior migration of disc material with slight deformation of spinal cord. No spinal stenosis or biforaminal foraminal stenosis. Compared to MRI on 10/26/10, there T1-2 disc protrusion has slightly increased. Otherwise no change.   Hinton Dyer, please mail a copy of this MRI cervical report.

## 2013-07-24 NOTE — Telephone Encounter (Signed)
Done

## 2013-07-27 ENCOUNTER — Ambulatory Visit (INDEPENDENT_AMBULATORY_CARE_PROVIDER_SITE_OTHER): Payer: Medicare Other | Admitting: Family Medicine

## 2013-07-27 DIAGNOSIS — E538 Deficiency of other specified B group vitamins: Secondary | ICD-10-CM | POA: Diagnosis not present

## 2013-07-27 MED ORDER — CYANOCOBALAMIN 1000 MCG/ML IJ SOLN
1000.0000 ug | Freq: Once | INTRAMUSCULAR | Status: AC
  Administered 2013-07-27: 1000 ug via INTRAMUSCULAR

## 2013-07-28 ENCOUNTER — Ambulatory Visit: Payer: Medicare Other | Admitting: Physical Therapy

## 2013-07-28 DIAGNOSIS — R269 Unspecified abnormalities of gait and mobility: Secondary | ICD-10-CM | POA: Diagnosis not present

## 2013-07-28 DIAGNOSIS — Z96649 Presence of unspecified artificial hip joint: Secondary | ICD-10-CM | POA: Diagnosis not present

## 2013-07-28 DIAGNOSIS — IMO0001 Reserved for inherently not codable concepts without codable children: Secondary | ICD-10-CM | POA: Diagnosis not present

## 2013-07-28 DIAGNOSIS — M5412 Radiculopathy, cervical region: Secondary | ICD-10-CM | POA: Diagnosis not present

## 2013-08-04 ENCOUNTER — Ambulatory Visit: Payer: Medicare Other | Admitting: Physical Therapy

## 2013-08-04 DIAGNOSIS — M5412 Radiculopathy, cervical region: Secondary | ICD-10-CM | POA: Diagnosis not present

## 2013-08-04 DIAGNOSIS — Z96649 Presence of unspecified artificial hip joint: Secondary | ICD-10-CM | POA: Diagnosis not present

## 2013-08-04 DIAGNOSIS — R269 Unspecified abnormalities of gait and mobility: Secondary | ICD-10-CM | POA: Diagnosis not present

## 2013-08-04 DIAGNOSIS — IMO0001 Reserved for inherently not codable concepts without codable children: Secondary | ICD-10-CM | POA: Diagnosis not present

## 2013-08-05 DIAGNOSIS — M545 Low back pain, unspecified: Secondary | ICD-10-CM | POA: Diagnosis not present

## 2013-08-05 DIAGNOSIS — M48061 Spinal stenosis, lumbar region without neurogenic claudication: Secondary | ICD-10-CM | POA: Diagnosis not present

## 2013-08-05 DIAGNOSIS — M5137 Other intervertebral disc degeneration, lumbosacral region: Secondary | ICD-10-CM | POA: Diagnosis not present

## 2013-08-05 DIAGNOSIS — IMO0002 Reserved for concepts with insufficient information to code with codable children: Secondary | ICD-10-CM | POA: Diagnosis not present

## 2013-08-06 ENCOUNTER — Ambulatory Visit: Payer: Medicare Other | Admitting: Physical Therapy

## 2013-08-06 DIAGNOSIS — M5412 Radiculopathy, cervical region: Secondary | ICD-10-CM | POA: Diagnosis not present

## 2013-08-06 DIAGNOSIS — R269 Unspecified abnormalities of gait and mobility: Secondary | ICD-10-CM | POA: Diagnosis not present

## 2013-08-06 DIAGNOSIS — Z96649 Presence of unspecified artificial hip joint: Secondary | ICD-10-CM | POA: Diagnosis not present

## 2013-08-06 DIAGNOSIS — IMO0001 Reserved for inherently not codable concepts without codable children: Secondary | ICD-10-CM | POA: Diagnosis not present

## 2013-08-10 ENCOUNTER — Ambulatory Visit: Payer: Medicare Other | Admitting: Physical Therapy

## 2013-08-10 ENCOUNTER — Telehealth: Payer: Self-pay | Admitting: Family Medicine

## 2013-08-10 DIAGNOSIS — M5412 Radiculopathy, cervical region: Secondary | ICD-10-CM | POA: Diagnosis not present

## 2013-08-10 DIAGNOSIS — R269 Unspecified abnormalities of gait and mobility: Secondary | ICD-10-CM | POA: Diagnosis not present

## 2013-08-10 DIAGNOSIS — Z96649 Presence of unspecified artificial hip joint: Secondary | ICD-10-CM | POA: Diagnosis not present

## 2013-08-10 DIAGNOSIS — IMO0001 Reserved for inherently not codable concepts without codable children: Secondary | ICD-10-CM | POA: Diagnosis not present

## 2013-08-10 NOTE — Telephone Encounter (Signed)
Pt requesting refill of HYDROcodone-acetaminophen (NORCO/VICODIN) 5-325 MG per tablet. Please call when ready for pick up.

## 2013-08-11 MED ORDER — HYDROCODONE-ACETAMINOPHEN 5-325 MG PO TABS: 1.0000 | ORAL_TABLET | Freq: Two times a day (BID) | ORAL | Status: DC

## 2013-08-11 NOTE — Telephone Encounter (Signed)
done

## 2013-08-11 NOTE — Telephone Encounter (Signed)
Script is ready for pick up and I left a message. 

## 2013-08-12 ENCOUNTER — Encounter: Payer: Self-pay | Admitting: Neurology

## 2013-08-12 ENCOUNTER — Ambulatory Visit: Payer: Medicare Other | Admitting: Physical Therapy

## 2013-08-12 DIAGNOSIS — R269 Unspecified abnormalities of gait and mobility: Secondary | ICD-10-CM | POA: Diagnosis not present

## 2013-08-12 DIAGNOSIS — IMO0001 Reserved for inherently not codable concepts without codable children: Secondary | ICD-10-CM | POA: Diagnosis not present

## 2013-08-12 DIAGNOSIS — Z96649 Presence of unspecified artificial hip joint: Secondary | ICD-10-CM | POA: Diagnosis not present

## 2013-08-12 DIAGNOSIS — M5412 Radiculopathy, cervical region: Secondary | ICD-10-CM | POA: Diagnosis not present

## 2013-08-17 ENCOUNTER — Ambulatory Visit: Payer: Medicare Other | Attending: Neurology | Admitting: Physical Therapy

## 2013-08-17 ENCOUNTER — Ambulatory Visit: Payer: Medicare Other | Admitting: Physical Therapy

## 2013-08-17 DIAGNOSIS — Z96649 Presence of unspecified artificial hip joint: Secondary | ICD-10-CM | POA: Diagnosis not present

## 2013-08-17 DIAGNOSIS — R269 Unspecified abnormalities of gait and mobility: Secondary | ICD-10-CM | POA: Diagnosis not present

## 2013-08-17 DIAGNOSIS — IMO0001 Reserved for inherently not codable concepts without codable children: Secondary | ICD-10-CM | POA: Insufficient documentation

## 2013-08-17 DIAGNOSIS — M5412 Radiculopathy, cervical region: Secondary | ICD-10-CM | POA: Diagnosis not present

## 2013-08-19 ENCOUNTER — Ambulatory Visit: Payer: Medicare Other | Admitting: Physical Therapy

## 2013-08-24 ENCOUNTER — Ambulatory Visit: Payer: Medicare Other | Admitting: Physical Therapy

## 2013-08-26 ENCOUNTER — Ambulatory Visit: Payer: Medicare Other | Admitting: Physical Therapy

## 2013-08-26 DIAGNOSIS — Z0000 Encounter for general adult medical examination without abnormal findings: Secondary | ICD-10-CM | POA: Diagnosis not present

## 2013-08-27 ENCOUNTER — Ambulatory Visit (INDEPENDENT_AMBULATORY_CARE_PROVIDER_SITE_OTHER): Payer: Medicare Other | Admitting: Family Medicine

## 2013-08-27 DIAGNOSIS — E538 Deficiency of other specified B group vitamins: Secondary | ICD-10-CM

## 2013-08-27 MED ORDER — CYANOCOBALAMIN 1000 MCG/ML IJ SOLN
1000.0000 ug | Freq: Once | INTRAMUSCULAR | Status: AC
  Administered 2013-08-27: 1000 ug via INTRAMUSCULAR

## 2013-08-28 ENCOUNTER — Other Ambulatory Visit: Payer: Self-pay | Admitting: Family Medicine

## 2013-08-29 ENCOUNTER — Other Ambulatory Visit: Payer: Self-pay | Admitting: Family Medicine

## 2013-09-11 ENCOUNTER — Other Ambulatory Visit: Payer: Self-pay | Admitting: Family Medicine

## 2013-09-11 NOTE — Telephone Encounter (Signed)
Per Dr. Sarajane Jews, okay to refill lidoderm patch and I did send script e-scribe.

## 2013-09-22 ENCOUNTER — Ambulatory Visit (INDEPENDENT_AMBULATORY_CARE_PROVIDER_SITE_OTHER): Payer: Medicare Other | Admitting: Family Medicine

## 2013-09-22 DIAGNOSIS — E538 Deficiency of other specified B group vitamins: Secondary | ICD-10-CM | POA: Diagnosis not present

## 2013-09-22 MED ORDER — CYANOCOBALAMIN 1000 MCG/ML IJ SOLN
1000.0000 ug | Freq: Once | INTRAMUSCULAR | Status: AC
  Administered 2013-09-22: 1000 ug via INTRAMUSCULAR

## 2013-10-22 ENCOUNTER — Other Ambulatory Visit: Payer: Self-pay

## 2013-10-22 MED ORDER — DULOXETINE HCL 20 MG PO CPEP: 20.0000 mg | ORAL_CAPSULE | Freq: Every day | ORAL | Status: DC

## 2013-10-26 ENCOUNTER — Ambulatory Visit (INDEPENDENT_AMBULATORY_CARE_PROVIDER_SITE_OTHER): Payer: Medicare Other | Admitting: Family Medicine

## 2013-10-26 ENCOUNTER — Ambulatory Visit: Payer: PRIVATE HEALTH INSURANCE | Admitting: Neurology

## 2013-10-26 DIAGNOSIS — E538 Deficiency of other specified B group vitamins: Secondary | ICD-10-CM | POA: Diagnosis not present

## 2013-10-26 MED ORDER — CYANOCOBALAMIN 1000 MCG/ML IJ SOLN
1000.0000 ug | Freq: Once | INTRAMUSCULAR | Status: AC
  Administered 2013-10-26: 1000 ug via INTRAMUSCULAR

## 2013-10-29 DIAGNOSIS — E119 Type 2 diabetes mellitus without complications: Secondary | ICD-10-CM | POA: Diagnosis not present

## 2013-10-29 DIAGNOSIS — H18829 Corneal disorder due to contact lens, unspecified eye: Secondary | ICD-10-CM | POA: Diagnosis not present

## 2013-10-29 DIAGNOSIS — H353190 Nonexudative age-related macular degeneration, unspecified eye, stage unspecified: Secondary | ICD-10-CM | POA: Diagnosis not present

## 2013-11-04 ENCOUNTER — Other Ambulatory Visit: Payer: Self-pay | Admitting: Family Medicine

## 2013-11-09 ENCOUNTER — Ambulatory Visit (INDEPENDENT_AMBULATORY_CARE_PROVIDER_SITE_OTHER): Payer: Medicare Other | Admitting: Neurology

## 2013-11-09 ENCOUNTER — Encounter: Payer: Self-pay | Admitting: Neurology

## 2013-11-09 VITALS — BP 126/69 | HR 69 | Ht 71.0 in | Wt 175.0 lb

## 2013-11-09 DIAGNOSIS — R269 Unspecified abnormalities of gait and mobility: Secondary | ICD-10-CM | POA: Diagnosis not present

## 2013-11-09 DIAGNOSIS — I10 Essential (primary) hypertension: Secondary | ICD-10-CM

## 2013-11-09 DIAGNOSIS — G959 Disease of spinal cord, unspecified: Secondary | ICD-10-CM

## 2013-11-09 DIAGNOSIS — M4712 Other spondylosis with myelopathy, cervical region: Secondary | ICD-10-CM

## 2013-11-09 DIAGNOSIS — G5600 Carpal tunnel syndrome, unspecified upper limb: Secondary | ICD-10-CM | POA: Diagnosis not present

## 2013-11-09 DIAGNOSIS — M542 Cervicalgia: Secondary | ICD-10-CM

## 2013-11-09 DIAGNOSIS — E785 Hyperlipidemia, unspecified: Secondary | ICD-10-CM | POA: Diagnosis not present

## 2013-11-09 DIAGNOSIS — E1149 Type 2 diabetes mellitus with other diabetic neurological complication: Secondary | ICD-10-CM

## 2013-11-09 MED ORDER — GABAPENTIN 400 MG PO CAPS: 400.0000 mg | ORAL_CAPSULE | Freq: Three times a day (TID) | ORAL | Status: DC

## 2013-11-09 MED ORDER — GABAPENTIN 400 MG PO CAPS: 400.0000 mg | ORAL_CAPSULE | Freq: Four times a day (QID) | ORAL | Status: DC

## 2013-11-09 NOTE — Progress Notes (Signed)
[facility]  HISTORICAL Andrew Norton is 80 years old right-handed Caucasian male, accompanied by his wife, primary care physician is Dr. Alysia Penna, he was a previous patient of Dr. love, last clinical visit was August 13 2012  He had a past medical history of diabetes, shingles involving right C7 dermatome, mild memory loss, essential tremor   He had a history of acute on chronic cervical cord compression at decompression surgery October 2009, by neurosurgeon Dr. Vertell Limber, anterior cervical decompression, fusion at C3-4, C4-5, C5-6, with allograft, and anterior cervical plate, post surgically, he developed cardiac arrhythmia, myelopathy, with profound  left arm, leg weakness, had slow recovery,  Since the surgery, he continues to notice burning and numbness in both hands, left greater than right, mild gait difficulty,   He was treated with gabapentin 300 mg for his neuropathic pain involving left arm, and leg  He also suffered bladder urgency, occasionally bladder spasm, bowel incontinence,  Post surgical MRI cervical January 2011 showed ACDF at C3, 4, C4-5, C5-6, interbody anterior plate and screw, hypodensity in the cord at C5-6, that is cystic, compatible with chronic myelomalacia, at C6 and 7, there is progression of central disc protrusion, disc osteophyte complex on the left, moderate  large extruded disc fragment at T1-2, extending cranially.  He had a repeat cervical MRI September 2011, which showed similar findings, but also questionable cord edema vs. myelomalacia at C4-5,  He also complained of severe right lower extremity shooting pain, fell, and broken his left hip, had open reduction internal fixation 2011 by Dr.Allusio,  He had worsening low back pain, CT myelogram December 2012 showed mild multifactorial spinal stenosis at L1-2, bilateral foraminal stenosis, mild multifactorial spinal stenosis at L3, moderate disc bulging at L4-5, with bilateral subarticular  recess, and foraminal stenosis, right greater than left, compression deformity of L1 and 2.  He eventually underwent lumbar decompression in April 2013 by Dr. Maxie Better,  Today he complains almost constant and mild to moderate left hand and left leg neuropathic pain, despite Neurontin treatment, mild worsening gait difficulty, and he is taking hydrocodone 5/325 mg twice a day, receiving epidural injection by Dr. Nelva Bush  UPDATE April 27th 2015: He continues to have gait difficulty, he has left hand and leg pain, as long as he is sitting, he relaxes well, if he walks a lot, he has pain in his left leg, going down to his left ankle and left foot   He has bladder urgency. He has bladder accident occasionally, mild constipation, taking miralex daily.  He has left 4th and 5th finger paresthesia, he is taking gabapentin 100 mg 3 tablets 4 times a day, Cymbalta 20 mg was added on in his last visit in January 2015, he did not notice any difference.  We have reviewed MRI cervical together: bone fusion and metal hardware from C3-C6 levels.  Spinal cord atrophy and gliosis at C6 level.  C6-7: disc bulging, uncovertebral joint hypertrophy, and facet hypertrophy with moderate spinal stenosis and severe biforaminal foraminal stenosis.  C3-4, C4-5 and C5-6 there is severe biforaminal foraminal stenosis.  T1-2: disc protrusion with superior migration of disc material with slight deformation of spinal cord.    Compared to MRI on 10/26/10, there T1-2 disc protrusion has slightly increased. Otherwise no change.   REVIEW OF SYSTEMS: Full 14 system review of systems performed and notable only for  low back pain, hearing loss, ringing years, shortness of breath, difficulty urination, achy muscles, muscle cramps, dizziness, numbness,  ALLERGIES: Allergies  Allergen Reactions  . Penicillins Hives    HOME MEDICATIONS: Outpatient Prescriptions Prior to Visit  Medication Sig Dispense Refill  . aspirin 81 MG chewable  tablet Chew 1 tablet (81 mg total) by mouth daily.  1 tablet  0  . cyanocobalamin (,VITAMIN B-12,) 1000 MCG/ML injection Inject 1,000 mcg into the muscle every 30 (thirty) days.      . cyclobenzaprine (FLEXERIL) 10 MG tablet       . DULoxetine (CYMBALTA) 20 MG capsule Take 1 capsule (20 mg total) by mouth daily.  90 capsule  2  . ferrous sulfate 220 (44 FE) MG/5ML solution TAKE 7ML BY MOUTH DAILY  473 mL  0  . finasteride (PROSCAR) 5 MG tablet Take 1 tablet (5 mg total) by mouth daily.  90 tablet  3  . gabapentin (NEURONTIN) 100 MG capsule Take 3 capsules (300 mg total) by mouth 4 (four) times daily.  1080 capsule  1  . glipiZIDE (GLUCOTROL) 10 MG tablet Take 1 tablet (10 mg total) by mouth 2 (two) times daily before a meal.  180 tablet  3  . HYDROcodone-acetaminophen (NORCO/VICODIN) 5-325 MG per tablet Take 1 tablet by mouth 2 (two) times daily.  60 tablet  0  . lidocaine (LIDODERM) 5 % APPLY 1 PATCH DAILY, REMOVEAND DISCARD PATCH WITHIN 12HOURS OR AS DIRECTED  90 patch  0  . losartan (COZAAR) 50 MG tablet Take 1 tablet (50 mg total) by mouth every morning.  90 tablet  3  . metFORMIN (GLUCOPHAGE) 500 MG tablet Take 1 tablet (500 mg total) by mouth 2 (two) times daily with a meal.  180 tablet  3  . polyethylene glycol powder (GLYCOLAX/MIRALAX) powder MIX 17 GRAMS( 1 CAPFUL) IN LIQUID OF CHOICE BY MOUTH ONCE DAILY AS DIRECTED  527 g  1  . simvastatin (ZOCOR) 40 MG tablet Take 1 tablet (40 mg total) by mouth every evening.  90 tablet  3  . Ferrous Sulfate 220 (44 FE) MG/5ML LIQD TAKE 7 MLS BY MOUTH DAILY  473 mL  0   No facility-administered medications prior to visit.    PAST MEDICAL HISTORY: Past Medical History  Diagnosis Date  . Anemia   . Diabetes mellitus   . Hyperlipidemia   . Renal insufficiency   . Paget's disease     scrotum  . Positive PPD, treated   . Hypertension     EKG, chest x ray 11/15/10 EPIC,  clearance Dr Sharlene Motts on chart/ pt states on meds to "prevent hypertension from  diabetes"  . Carpal tunnel syndrome     left leg sciatica  . Easy bruising   . Stroke     SPINAL CORD STROKE PER OV note 3/13  DR LOVE CLEARANCE NOTE_  ON CHART WITH RECOMMENDATIONS-   has weakness  upper extremities  . Arthritis     hips  . Hearing loss     left ear greater loss  . Cancer     skin can lesions, and non cancer skin lesions-follwed by Dr. [place] Crane  . History of shingles     past hx. left hand    PAST SURGICAL HISTORY: Past Surgical History  Procedure Laterality Date  . Tonsillectomy    . Partial removal of scrotum    . Cervical fusion      dr Vertell Limber 2009- retained hardware  . Hip fracture surgery  11-24-10    left hip per Dr, [name]  . Colonoscopy  2-08  per Dr. Oletta Lamas, normal   . Eye surgery      bilateral cataract extraction with IOL  . Lumbar laminectomy/decompression microdiscectomy  11/07/2011    Procedure: LUMBAR LAMINECTOMY/DECOMPRESSION MICRODISCECTOMY;  Surgeon: Johnn Hai, MD;  Location: [facility];  Service: Orthopedics;  Laterality: N/A;  Decompression of the L4 - L5 Central (X-ray)   . Total hip revision Left 11/05/2012    Procedure: Conversion of a Bipolar to a Left Total Hip Arthroplasty;  Surgeon: [name], MD;  Location: [facility];  Service: Orthopedics;  Laterality: Left;  Conversion of a Bipolar to a Left Total Hip Arthroplasty    FAMILY HISTORY: Family History  Problem Relation Age of Onset  . Tuberculosis      SOCIAL HISTORY:  History   Social History  . Marital Status: Married    Spouse Name: [name]    Number of Children: 3  . Years of Education: College-BS   Occupational History  . retired    Social History Main Topics  . Smoking status: Never Smoker   . Smokeless tobacco: Never Used  . Alcohol Use: No  . Drug Use: No  . Sexual Activity: No   Other Topics Concern  . Not on file   Social History Narrative   Pt lives at home with wife [name])   Pt is right handed   Education college-BS   Pt states that he  drinks 1 cup of coffee daily, and occ may have a cup of tea or soda     PHYSICAL EXAM   Filed Vitals:   11/09/13 1553  BP: 126/69  Pulse: 69  Height: 5\' 11"  (1.803 m)  Weight: 175 lb (79.379 kg)    Not recorded    Body mass index is 24.42 kg/(m^2).   Generalized: In no acute distress  Neck: Supple, no carotid bruits   Cardiac: Regular rate rhythm  Pulmonary: Clear to auscultation bilaterally  Musculoskeletal: No deformity  Neurological examination  Mentation: Alert oriented to time, place, history taking, and causual conversation  Cranial nerve II-XII: Pupils were equal round reactive to light extraocular movements were full, Visual field were full on confrontational test. Bilateral fundi were sharp.  Facial sensation and strength were normal. Hearing was intact to finger rubbing bilaterally. Uvula tongue midline.  head turning and shoulder shrug and were normal and symmetric.Tongue protrusion into cheek strength was normal.  Motor: he has mild left hip flexion, weakness, mild left shoulder abduction, external rotation, and the left hand grip weakness,  Sensory: length dependent decreased fine touch, pinprick to knee level,, decreased vibratory sensation to knee level  Coordination: Normal finger to nose, heel-to-shin bilaterally there was no truncal ataxia  Gait: Rising up from seated position using cane, but pushing on a chair arm, cautious, atalgic, dragging left leg some,   Romberg signs: Negative  Deep tendon reflexes: Brachioradialis 2/2, biceps 2/2, triceps 2/2, patellar 2/2, Achilles trace,, plantar responses were flexor bilaterally.   DIAGNOSTIC DATA (LABS, IMAGING, TESTING) - I reviewed patient records, labs, notes, testing and imaging myself where available.  Lab Results  Component Value Date   WBC 5.8 06/01/2013   HGB 10.6* 06/01/2013   HCT 31.4* 06/01/2013   MCV 91.1 06/01/2013   PLT 233.0 06/01/2013      Component Value Date/Time   NA 139  06/01/2013 1144   K 5.8* 06/01/2013 1144   CL 106 06/01/2013 1144   CO2 26 06/01/2013 1144   GLUCOSE 112* 06/01/2013 1144   BUN 15  06/01/2013 1144   CREATININE 0.8 06/01/2013 1144   CALCIUM 9.2 06/01/2013 1144   PROT 6.8 06/01/2013 1144   ALBUMIN 4.2 06/01/2013 1144   AST 22 06/01/2013 1144   ALT 16 06/01/2013 1144   ALKPHOS 75 06/01/2013 1144   BILITOT 0.8 06/01/2013 1144   GFRNONAA 85* 11/07/2012 0442   GFRAA >90 11/07/2012 0442   Lab Results  Component Value Date   CHOL 91 06/01/2013   HDL 41.90 06/01/2013   LDLCALC 34 06/01/2013   TRIG 78.0 06/01/2013   CHOLHDL 2 06/01/2013   Lab Results  Component Value Date   HGBA1C 7.7* 06/01/2013   Lab Results  Component Value Date   VITAMINB12 384 03/01/2011   Lab Results  Component Value Date   TSH 1.95 06/01/2013      ASSESSMENT AND PLAN   80 years old right-handed Caucasian male, with past medical history of cervical myelopathy, lumbar radiculopathy, status post decompression surgery, continued to have left arm, leg neuropathic pain, slow worsening gait difficulty, left-sided weakness, MRI cervical showed chronic changes, including spinal cord atrophy, gliosis at C6 level  Increase gabapentin to 401 3 times a day, return to clinic in 6 months.           Marcial Pacas, M.D. Ph.D.  [facility] [address] [place], [place] [address] [phone]

## 2013-11-10 DIAGNOSIS — G959 Disease of spinal cord, unspecified: Secondary | ICD-10-CM | POA: Insufficient documentation

## 2013-11-11 ENCOUNTER — Telehealth: Payer: Self-pay | Admitting: Family Medicine

## 2013-11-11 MED ORDER — HYDROCODONE-ACETAMINOPHEN 5-325 MG PO TABS: 1.0000 | ORAL_TABLET | Freq: Two times a day (BID) | ORAL | Status: DC

## 2013-11-11 NOTE — Telephone Encounter (Signed)
done

## 2013-11-11 NOTE — Telephone Encounter (Signed)
Script is ready for pick up and I left a voice message.  

## 2013-11-11 NOTE — Telephone Encounter (Signed)
Pt needs new rx hydrocodone. Pt is taking his last pill. Pt is aware may take up to 3 business day

## 2013-11-23 ENCOUNTER — Ambulatory Visit (INDEPENDENT_AMBULATORY_CARE_PROVIDER_SITE_OTHER): Payer: Medicare Other | Admitting: Family Medicine

## 2013-11-23 DIAGNOSIS — E538 Deficiency of other specified B group vitamins: Secondary | ICD-10-CM

## 2013-11-23 MED ORDER — CYANOCOBALAMIN 1000 MCG/ML IJ SOLN
1000.0000 ug | Freq: Once | INTRAMUSCULAR | Status: AC
  Administered 2013-11-23: 1000 ug via INTRAMUSCULAR

## 2013-12-16 ENCOUNTER — Other Ambulatory Visit: Payer: Self-pay | Admitting: Family Medicine

## 2013-12-23 ENCOUNTER — Ambulatory Visit (INDEPENDENT_AMBULATORY_CARE_PROVIDER_SITE_OTHER): Payer: Medicare Other | Admitting: Family Medicine

## 2013-12-23 DIAGNOSIS — E538 Deficiency of other specified B group vitamins: Secondary | ICD-10-CM | POA: Diagnosis not present

## 2013-12-23 MED ORDER — CYANOCOBALAMIN 1000 MCG/ML IJ SOLN
1000.0000 ug | Freq: Once | INTRAMUSCULAR | Status: AC
Start: 2013-12-23 — End: 2013-12-23
  Administered 2013-12-23: 1000 ug via INTRAMUSCULAR

## 2013-12-29 DIAGNOSIS — D0430 Carcinoma in situ of skin of unspecified part of face: Secondary | ICD-10-CM | POA: Diagnosis not present

## 2013-12-29 DIAGNOSIS — D0439 Carcinoma in situ of skin of other parts of face: Secondary | ICD-10-CM | POA: Diagnosis not present

## 2013-12-29 DIAGNOSIS — D235 Other benign neoplasm of skin of trunk: Secondary | ICD-10-CM | POA: Diagnosis not present

## 2013-12-29 DIAGNOSIS — L570 Actinic keratosis: Secondary | ICD-10-CM | POA: Diagnosis not present

## 2013-12-29 DIAGNOSIS — Z85820 Personal history of malignant melanoma of skin: Secondary | ICD-10-CM | POA: Diagnosis not present

## 2014-01-14 ENCOUNTER — Other Ambulatory Visit: Payer: Self-pay | Admitting: Family Medicine

## 2014-01-19 ENCOUNTER — Other Ambulatory Visit: Payer: Self-pay | Admitting: Family Medicine

## 2014-01-22 ENCOUNTER — Ambulatory Visit (INDEPENDENT_AMBULATORY_CARE_PROVIDER_SITE_OTHER): Payer: Medicare Other | Admitting: Family Medicine

## 2014-01-22 DIAGNOSIS — E538 Deficiency of other specified B group vitamins: Secondary | ICD-10-CM | POA: Diagnosis not present

## 2014-01-22 MED ORDER — CYANOCOBALAMIN 1000 MCG/ML IJ SOLN
1000.0000 ug | Freq: Once | INTRAMUSCULAR | Status: AC
  Administered 2014-01-22: 1000 ug via INTRAMUSCULAR

## 2014-01-27 ENCOUNTER — Other Ambulatory Visit: Payer: Self-pay | Admitting: Family Medicine

## 2014-02-08 ENCOUNTER — Telehealth: Payer: Self-pay | Admitting: Family Medicine

## 2014-02-08 MED ORDER — HYDROCODONE-ACETAMINOPHEN 5-325 MG PO TABS: 1.0000 | ORAL_TABLET | Freq: Two times a day (BID) | ORAL | Status: DC

## 2014-02-08 NOTE — Telephone Encounter (Signed)
Pt request refill HYDROcodone-acetaminophen (NORCO/VICODIN) 5-325 MG per tablet °

## 2014-02-08 NOTE — Telephone Encounter (Signed)
done

## 2014-02-09 NOTE — Telephone Encounter (Signed)
Script is ready for pick up and I spoke with pt.  

## 2014-02-22 ENCOUNTER — Ambulatory Visit (INDEPENDENT_AMBULATORY_CARE_PROVIDER_SITE_OTHER): Payer: Medicare Other | Admitting: Family Medicine

## 2014-02-22 DIAGNOSIS — E538 Deficiency of other specified B group vitamins: Secondary | ICD-10-CM | POA: Diagnosis not present

## 2014-02-22 MED ORDER — CYANOCOBALAMIN 1000 MCG/ML IJ SOLN
1000.0000 ug | Freq: Once | INTRAMUSCULAR | Status: AC
  Administered 2014-02-22: 1000 ug via INTRAMUSCULAR

## 2014-03-25 ENCOUNTER — Ambulatory Visit (INDEPENDENT_AMBULATORY_CARE_PROVIDER_SITE_OTHER): Payer: Medicare Other | Admitting: Family Medicine

## 2014-03-25 DIAGNOSIS — E538 Deficiency of other specified B group vitamins: Secondary | ICD-10-CM

## 2014-03-25 DIAGNOSIS — Z23 Encounter for immunization: Secondary | ICD-10-CM | POA: Diagnosis not present

## 2014-03-25 MED ORDER — CYANOCOBALAMIN 1000 MCG/ML IJ SOLN
1000.0000 ug | Freq: Once | INTRAMUSCULAR | Status: AC
  Administered 2014-03-25: 1000 ug via INTRAMUSCULAR

## 2014-04-12 ENCOUNTER — Telehealth: Payer: Self-pay | Admitting: Family Medicine

## 2014-04-12 NOTE — Telephone Encounter (Signed)
Pt request refill HYDROcodone-acetaminophen (NORCO/VICODIN) 5-325 MG per tablet Pt states he will be out of med tomorrow.

## 2014-04-13 NOTE — Telephone Encounter (Signed)
He has refills available until 05-12-14

## 2014-04-14 NOTE — Telephone Encounter (Signed)
I spoke with pt and he found script.

## 2014-04-21 ENCOUNTER — Other Ambulatory Visit: Payer: Self-pay | Admitting: Family Medicine

## 2014-04-23 ENCOUNTER — Ambulatory Visit (INDEPENDENT_AMBULATORY_CARE_PROVIDER_SITE_OTHER): Payer: Medicare Other | Admitting: Family Medicine

## 2014-04-23 DIAGNOSIS — E538 Deficiency of other specified B group vitamins: Secondary | ICD-10-CM | POA: Diagnosis not present

## 2014-04-23 DIAGNOSIS — E519 Thiamine deficiency, unspecified: Secondary | ICD-10-CM

## 2014-04-23 MED ORDER — CYANOCOBALAMIN 1000 MCG/ML IJ SOLN
1000.0000 ug | Freq: Once | INTRAMUSCULAR | Status: AC
  Administered 2014-04-23: 1000 ug via INTRAMUSCULAR

## 2014-05-12 ENCOUNTER — Telehealth: Payer: Self-pay | Admitting: Family Medicine

## 2014-05-12 MED ORDER — HYDROCODONE-ACETAMINOPHEN 5-325 MG PO TABS: 1.0000 | ORAL_TABLET | Freq: Two times a day (BID) | ORAL | Status: DC

## 2014-05-12 NOTE — Telephone Encounter (Signed)
done

## 2014-05-12 NOTE — Telephone Encounter (Signed)
Pt request refill HYDROcodone-acetaminophen (NORCO/VICODIN) 5-325 MG per tablet Pt states dr fry gives him 3 rx at a time

## 2014-05-13 NOTE — Telephone Encounter (Signed)
Pt called checking the status of the below re-fill request.

## 2014-05-14 NOTE — Telephone Encounter (Signed)
Script is ready for pick up and I spoke with pt.  

## 2014-05-25 DIAGNOSIS — Z85820 Personal history of malignant melanoma of skin: Secondary | ICD-10-CM | POA: Diagnosis not present

## 2014-05-25 DIAGNOSIS — L570 Actinic keratosis: Secondary | ICD-10-CM | POA: Diagnosis not present

## 2014-05-25 DIAGNOSIS — X32XXXA Exposure to sunlight, initial encounter: Secondary | ICD-10-CM | POA: Diagnosis not present

## 2014-05-25 DIAGNOSIS — Z08 Encounter for follow-up examination after completed treatment for malignant neoplasm: Secondary | ICD-10-CM | POA: Diagnosis not present

## 2014-05-25 DIAGNOSIS — C44329 Squamous cell carcinoma of skin of other parts of face: Secondary | ICD-10-CM | POA: Diagnosis not present

## 2014-05-25 DIAGNOSIS — C44320 Squamous cell carcinoma of skin of unspecified parts of face: Secondary | ICD-10-CM | POA: Diagnosis not present

## 2014-05-27 ENCOUNTER — Other Ambulatory Visit: Payer: Self-pay | Admitting: Family Medicine

## 2014-05-27 ENCOUNTER — Ambulatory Visit: Payer: Medicare Other | Admitting: Family Medicine

## 2014-05-28 ENCOUNTER — Ambulatory Visit (INDEPENDENT_AMBULATORY_CARE_PROVIDER_SITE_OTHER): Payer: Medicare Other | Admitting: Family Medicine

## 2014-05-28 DIAGNOSIS — E538 Deficiency of other specified B group vitamins: Secondary | ICD-10-CM

## 2014-05-28 MED ORDER — CYANOCOBALAMIN 1000 MCG/ML IJ SOLN
1000.0000 ug | Freq: Once | INTRAMUSCULAR | Status: AC
  Administered 2014-05-28: 1000 ug via INTRAMUSCULAR

## 2014-05-31 ENCOUNTER — Other Ambulatory Visit: Payer: Self-pay | Admitting: Family Medicine

## 2014-05-31 NOTE — Telephone Encounter (Signed)
Pt needs refill on losartan 50 mg #90 w/refills [facility].

## 2014-06-02 MED ORDER — LOSARTAN POTASSIUM 50 MG PO TABS: 50.0000 mg | ORAL_TABLET | Freq: Every morning | ORAL | Status: DC

## 2014-06-02 NOTE — Telephone Encounter (Signed)
Rx sent to pharmacy for 90 days.  Pt has upcoming psychical appt.

## 2014-06-09 ENCOUNTER — Other Ambulatory Visit: Payer: Self-pay | Admitting: Family Medicine

## 2014-06-24 ENCOUNTER — Ambulatory Visit (INDEPENDENT_AMBULATORY_CARE_PROVIDER_SITE_OTHER): Payer: Medicare Other | Admitting: Family Medicine

## 2014-06-24 DIAGNOSIS — E538 Deficiency of other specified B group vitamins: Secondary | ICD-10-CM | POA: Diagnosis not present

## 2014-06-24 MED ORDER — CYANOCOBALAMIN 1000 MCG/ML IJ SOLN
1000.0000 ug | Freq: Once | INTRAMUSCULAR | Status: AC
  Administered 2014-06-24: 1000 ug via INTRAMUSCULAR

## 2014-07-04 ENCOUNTER — Other Ambulatory Visit: Payer: Self-pay | Admitting: Family Medicine

## 2014-07-05 NOTE — Telephone Encounter (Signed)
Patient called to check the status of the below re-fill request.  He said he has 2 weeks left and it is going mail order.

## 2014-07-05 NOTE — Telephone Encounter (Signed)
Sent to the pharmacy for 90 days.  Pt has upcoming appt on 07/14/14

## 2014-07-06 DIAGNOSIS — Z85828 Personal history of other malignant neoplasm of skin: Secondary | ICD-10-CM | POA: Diagnosis not present

## 2014-07-06 DIAGNOSIS — Z08 Encounter for follow-up examination after completed treatment for malignant neoplasm: Secondary | ICD-10-CM | POA: Diagnosis not present

## 2014-07-14 ENCOUNTER — Ambulatory Visit (INDEPENDENT_AMBULATORY_CARE_PROVIDER_SITE_OTHER): Payer: Medicare Other | Admitting: Family Medicine

## 2014-07-14 ENCOUNTER — Encounter: Payer: Self-pay | Admitting: Family Medicine

## 2014-07-14 VITALS — BP 143/65 | HR 81 | Temp 98.0°F | Ht 71.0 in | Wt 173.0 lb

## 2014-07-14 DIAGNOSIS — N528 Other male erectile dysfunction: Secondary | ICD-10-CM

## 2014-07-14 DIAGNOSIS — M5440 Lumbago with sciatica, unspecified side: Secondary | ICD-10-CM

## 2014-07-14 DIAGNOSIS — M25572 Pain in left ankle and joints of left foot: Secondary | ICD-10-CM

## 2014-07-14 DIAGNOSIS — E785 Hyperlipidemia, unspecified: Secondary | ICD-10-CM | POA: Diagnosis not present

## 2014-07-14 DIAGNOSIS — Z23 Encounter for immunization: Secondary | ICD-10-CM | POA: Diagnosis not present

## 2014-07-14 DIAGNOSIS — N401 Enlarged prostate with lower urinary tract symptoms: Secondary | ICD-10-CM | POA: Diagnosis not present

## 2014-07-14 DIAGNOSIS — N259 Disorder resulting from impaired renal tubular function, unspecified: Secondary | ICD-10-CM | POA: Diagnosis not present

## 2014-07-14 DIAGNOSIS — E119 Type 2 diabetes mellitus without complications: Secondary | ICD-10-CM

## 2014-07-14 DIAGNOSIS — N138 Other obstructive and reflux uropathy: Secondary | ICD-10-CM

## 2014-07-14 DIAGNOSIS — N529 Male erectile dysfunction, unspecified: Secondary | ICD-10-CM

## 2014-07-14 LAB — MICROALBUMIN / CREATININE URINE RATIO
Creatinine,U: 156.9 mg/dL
Microalb Creat Ratio: 1.7 mg/g (ref 0.0–30.0)
Microalb, Ur: 2.6 mg/dL — ABNORMAL HIGH (ref 0.0–1.9)

## 2014-07-14 LAB — CBC WITH DIFFERENTIAL/PLATELET
BASOS PCT: 0.6 % (ref 0.0–3.0)
Basophils Absolute: 0 10*3/uL (ref 0.0–0.1)
Eosinophils Absolute: 0.4 10*3/uL (ref 0.0–0.7)
Eosinophils Relative: 5.1 % — ABNORMAL HIGH (ref 0.0–5.0)
HEMATOCRIT: 32.9 % — AB (ref 39.0–52.0)
HEMOGLOBIN: 11.1 g/dL — AB (ref 13.0–17.0)
LYMPHS ABS: 1.4 10*3/uL (ref 0.7–4.0)
Lymphocytes Relative: 17.9 % (ref 12.0–46.0)
MCHC: 33.8 g/dL (ref 30.0–36.0)
MCV: 90.8 fl (ref 78.0–100.0)
Monocytes Absolute: 0.6 10*3/uL (ref 0.1–1.0)
Monocytes Relative: 7.8 % (ref 3.0–12.0)
Neutro Abs: 5.2 10*3/uL (ref 1.4–7.7)
Neutrophils Relative %: 68.6 % (ref 43.0–77.0)
Platelets: 266 10*3/uL (ref 150.0–400.0)
RBC: 3.62 Mil/uL — AB (ref 4.22–5.81)
RDW: 14.3 % (ref 11.5–15.5)
WBC: 7.6 10*3/uL (ref 4.0–10.5)

## 2014-07-14 LAB — LIPID PANEL
Cholesterol: 87 mg/dL (ref 0–200)
HDL: 35.3 mg/dL — ABNORMAL LOW (ref >39.00)
LDL Cholesterol: 27 mg/dL (ref 0–99)
NonHDL: 51.7
Total CHOL/HDL Ratio: 2
Triglycerides: 123 mg/dL (ref 0.0–149.0)
VLDL: 24.6 mg/dL (ref 0.0–40.0)

## 2014-07-14 LAB — HEPATIC FUNCTION PANEL
ALT: 18 U/L (ref 0–53)
AST: 22 U/L (ref 0–37)
Albumin: 4.2 g/dL (ref 3.5–5.2)
Alkaline Phosphatase: 86 U/L (ref 39–117)
BILIRUBIN DIRECT: 0.2 mg/dL (ref 0.0–0.3)
TOTAL PROTEIN: 7.1 g/dL (ref 6.0–8.3)
Total Bilirubin: 0.5 mg/dL (ref 0.2–1.2)

## 2014-07-14 LAB — BASIC METABOLIC PANEL
BUN: 19 mg/dL (ref 6–23)
CALCIUM: 9.2 mg/dL (ref 8.4–10.5)
CO2: 25 mEq/L (ref 19–32)
Chloride: 104 mEq/L (ref 96–112)
Creatinine, Ser: 0.9 mg/dL (ref 0.4–1.5)
GFR: 88.47 mL/min (ref >60.00)
Glucose, Bld: 159 mg/dL — ABNORMAL HIGH (ref 70–99)
Potassium: 5.9 mEq/L — ABNORMAL HIGH (ref 3.5–5.1)
Sodium: 138 mEq/L (ref 135–145)

## 2014-07-14 LAB — POCT URINALYSIS DIPSTICK
Bilirubin, UA: NEGATIVE
Blood, UA: NEGATIVE
Glucose, UA: NEGATIVE
Leukocytes, UA: NEGATIVE
Nitrite, UA: NEGATIVE
PH UA: 6
SPEC GRAV UA: 1.02
Urobilinogen, UA: 1

## 2014-07-14 LAB — TSH: TSH: 3.29 u[IU]/mL (ref 0.35–4.50)

## 2014-07-14 LAB — PSA: PSA: 0.31 ng/mL (ref 0.10–4.00)

## 2014-07-14 LAB — HEMOGLOBIN A1C: Hgb A1c MFr Bld: 9.4 % — ABNORMAL HIGH (ref 4.6–6.5)

## 2014-07-14 MED ORDER — FINASTERIDE 5 MG PO TABS: 5.0000 mg | ORAL_TABLET | Freq: Every day | ORAL | Status: DC

## 2014-07-14 MED ORDER — CYCLOBENZAPRINE HCL 10 MG PO TABS: ORAL_TABLET | ORAL | Status: DC

## 2014-07-14 MED ORDER — POLYETHYLENE GLYCOL 3350 17 GM/SCOOP PO POWD: ORAL | Status: DC

## 2014-07-14 MED ORDER — GLIPIZIDE 10 MG PO TABS: ORAL_TABLET | ORAL | Status: DC

## 2014-07-14 MED ORDER — SIMVASTATIN 40 MG PO TABS: 40.0000 mg | ORAL_TABLET | Freq: Every evening | ORAL | Status: DC

## 2014-07-14 MED ORDER — FERROUS SULFATE 220 (44 FE) MG/5ML PO LIQD: ORAL | Status: DC

## 2014-07-14 MED ORDER — LOSARTAN POTASSIUM 50 MG PO TABS: 50.0000 mg | ORAL_TABLET | Freq: Every morning | ORAL | Status: DC

## 2014-07-14 MED ORDER — METFORMIN HCL 500 MG PO TABS: ORAL_TABLET | ORAL | Status: DC

## 2014-07-14 NOTE — Addendum Note (Signed)
Addended by: Aggie Hacker A on: 07/14/2014 12:31 PM   Modules accepted: Orders

## 2014-07-14 NOTE — Progress Notes (Signed)
   Subjective:    Patient ID: Andrew Norton, male    DOB: 21-May-1927, 80 y.o.   MRN: [id number]  HPI 80 yr old male for a cpx. His main complaint today is pain in the legs, ankles and feet. The left ankle is the most painful. He takes Norco twice daily along with Gabapentin.    Review of Systems  Constitutional: Negative.   HENT: Negative.   Eyes: Negative.   Respiratory: Negative.   Cardiovascular: Negative.   Gastrointestinal: Negative.   Genitourinary: Negative.   Musculoskeletal: Negative.   Skin: Negative.   Neurological: Negative.   Psychiatric/Behavioral: Negative.        Objective:   Physical Exam  Constitutional: He is oriented to person, place, and time. He appears well-developed and well-nourished. No distress.  HENT:  Head: Normocephalic and atraumatic.  Right Ear: External ear normal.  Left Ear: External ear normal.  Nose: Nose normal.  Mouth/Throat: Oropharynx is clear and moist. No oropharyngeal exudate.  Eyes: Conjunctivae and EOM are normal. Pupils are equal, round, and reactive to light. Right eye exhibits no discharge. Left eye exhibits no discharge. No scleral icterus.  Neck: Neck supple. No JVD present. No tracheal deviation present. No thyromegaly present.  Cardiovascular: Normal rate, regular rhythm, normal heart sounds and intact distal pulses.  Exam reveals no gallop and no friction rub.   No murmur heard. EKG normal   Pulmonary/Chest: Effort normal and breath sounds normal. No respiratory distress. He has no wheezes. He has no rales. He exhibits no tenderness.  Abdominal: Soft. Bowel sounds are normal. He exhibits no distension and no mass. There is no tenderness. There is no rebound and no guarding.  Genitourinary: Rectum normal, prostate normal and penis normal. Guaiac negative stool. No penile tenderness.  Musculoskeletal: Normal range of motion. He exhibits no edema or tenderness.  Lymphadenopathy:    He has no cervical adenopathy.  Neurological:  He is alert and oriented to person, place, and time. He has normal reflexes. No cranial nerve deficit. He exhibits normal muscle tone. Coordination normal.  Skin: Skin is warm and dry. No rash noted. He is not diaphoretic. No erythema. No pallor.  Psychiatric: He has a normal mood and affect. His behavior is normal. Judgment and thought content normal.          Assessment & Plan:  Well exam. Get fasting labs. Refer to Orthopedics for the ankle pain. Increase Norco to tid.

## 2014-07-14 NOTE — Progress Notes (Signed)
Pre visit review using our clinic review tool, if applicable. No additional management support is needed unless otherwise documented below in the visit note. 

## 2014-07-15 ENCOUNTER — Other Ambulatory Visit: Payer: Self-pay | Admitting: Family Medicine

## 2014-07-20 ENCOUNTER — Other Ambulatory Visit: Payer: Self-pay | Admitting: *Deleted

## 2014-07-20 MED ORDER — METFORMIN HCL 1000 MG PO TABS: 1000.0000 mg | ORAL_TABLET | Freq: Two times a day (BID) | ORAL | Status: DC

## 2014-07-28 ENCOUNTER — Ambulatory Visit (INDEPENDENT_AMBULATORY_CARE_PROVIDER_SITE_OTHER): Payer: Medicare Other | Admitting: Family Medicine

## 2014-07-28 ENCOUNTER — Telehealth: Payer: Self-pay | Admitting: Family Medicine

## 2014-07-28 DIAGNOSIS — E538 Deficiency of other specified B group vitamins: Secondary | ICD-10-CM

## 2014-07-28 DIAGNOSIS — Z23 Encounter for immunization: Secondary | ICD-10-CM

## 2014-07-28 MED ORDER — GABAPENTIN 400 MG PO CAPS: 400.0000 mg | ORAL_CAPSULE | Freq: Four times a day (QID) | ORAL | Status: DC

## 2014-07-28 MED ORDER — CYANOCOBALAMIN 1000 MCG/ML IJ SOLN
1000.0000 ug | Freq: Once | INTRAMUSCULAR | Status: AC
  Administered 2014-07-28: 1000 ug via INTRAMUSCULAR

## 2014-07-28 MED ORDER — LIDOCAINE 5 % EX PTCH: 1.0000 | MEDICATED_PATCH | CUTANEOUS | Status: DC

## 2014-07-28 NOTE — Telephone Encounter (Signed)
Pt requested a refill on Lidoderm 5 % patch & Gabapentin. Per Dr. Sarajane Jews okay to send in refills and I did this e-scribe.

## 2014-08-04 MED ORDER — HYDROCODONE-ACETAMINOPHEN 5-325 MG PO TABS: 1.0000 | ORAL_TABLET | Freq: Three times a day (TID) | ORAL | Status: DC | PRN

## 2014-08-04 NOTE — Addendum Note (Signed)
Addended by: Alysia Penna A on: 08/04/2014 01:36 PM   Modules accepted: Orders

## 2014-08-26 ENCOUNTER — Other Ambulatory Visit: Payer: Self-pay | Admitting: Family Medicine

## 2014-09-01 ENCOUNTER — Ambulatory Visit (INDEPENDENT_AMBULATORY_CARE_PROVIDER_SITE_OTHER): Payer: Medicare Other | Admitting: Family Medicine

## 2014-09-01 DIAGNOSIS — E538 Deficiency of other specified B group vitamins: Secondary | ICD-10-CM

## 2014-09-01 MED ORDER — CYANOCOBALAMIN 1000 MCG/ML IJ SOLN
1000.0000 ug | Freq: Once | INTRAMUSCULAR | Status: AC
  Administered 2014-09-01: 1000 ug via INTRAMUSCULAR

## 2014-09-10 DIAGNOSIS — E1142 Type 2 diabetes mellitus with diabetic polyneuropathy: Secondary | ICD-10-CM | POA: Diagnosis not present

## 2014-09-23 ENCOUNTER — Ambulatory Visit (INDEPENDENT_AMBULATORY_CARE_PROVIDER_SITE_OTHER): Payer: Medicare Other

## 2014-09-23 ENCOUNTER — Ambulatory Visit: Payer: No Typology Code available for payment source | Admitting: Family Medicine

## 2014-09-23 DIAGNOSIS — E538 Deficiency of other specified B group vitamins: Secondary | ICD-10-CM | POA: Diagnosis not present

## 2014-09-23 DIAGNOSIS — L84 Corns and callosities: Secondary | ICD-10-CM | POA: Diagnosis not present

## 2014-09-23 DIAGNOSIS — E119 Type 2 diabetes mellitus without complications: Secondary | ICD-10-CM | POA: Diagnosis not present

## 2014-09-23 DIAGNOSIS — R601 Generalized edema: Secondary | ICD-10-CM | POA: Diagnosis not present

## 2014-09-23 DIAGNOSIS — M2040 Other hammer toe(s) (acquired), unspecified foot: Secondary | ICD-10-CM | POA: Diagnosis not present

## 2014-09-23 DIAGNOSIS — L602 Onychogryphosis: Secondary | ICD-10-CM | POA: Diagnosis not present

## 2014-09-23 MED ORDER — CYANOCOBALAMIN 1000 MCG/ML IJ SOLN
1000.0000 ug | Freq: Once | INTRAMUSCULAR | Status: AC
  Administered 2014-09-23: 1000 ug via INTRAMUSCULAR

## 2014-09-29 ENCOUNTER — Ambulatory Visit (INDEPENDENT_AMBULATORY_CARE_PROVIDER_SITE_OTHER): Payer: Medicare Other | Admitting: Family Medicine

## 2014-09-29 ENCOUNTER — Encounter: Payer: Self-pay | Admitting: Family Medicine

## 2014-09-29 VITALS — BP 154/66 | HR 82 | Temp 98.4°F | Ht 71.0 in | Wt 179.0 lb

## 2014-09-29 DIAGNOSIS — L309 Dermatitis, unspecified: Secondary | ICD-10-CM

## 2014-09-29 MED ORDER — TRIAMCINOLONE ACETONIDE 0.1 % EX CREA: 1.0000 "application " | TOPICAL_CREAM | Freq: Two times a day (BID) | CUTANEOUS | Status: DC

## 2014-09-29 NOTE — Progress Notes (Signed)
   Subjective:    Patient ID: Andrew Norton, male    DOB: Apr 05, 1927, 81 y.o.   MRN: [id number]  HPI Here to look at a rash on his arms that started about 3 months ago. He has continued to develop new red spots since then. They itch and get tender at times.    Review of Systems  Constitutional: Negative.   Respiratory: Negative.   Cardiovascular: Negative.   Skin: Positive for rash.       Objective:   Physical Exam  Constitutional: He appears well-developed and well-nourished.  Skin:  Both arms have multiple macular red areas, some smooth and some scaly           Assessment & Plan:  This appears to be eczematous. Try Triamcinolone cream bid

## 2014-09-29 NOTE — Progress Notes (Signed)
Pre visit review using our clinic review tool, if applicable. No additional management support is needed unless otherwise documented below in the visit note. 

## 2014-10-06 DIAGNOSIS — L570 Actinic keratosis: Secondary | ICD-10-CM | POA: Diagnosis not present

## 2014-10-06 DIAGNOSIS — Z85828 Personal history of other malignant neoplasm of skin: Secondary | ICD-10-CM | POA: Diagnosis not present

## 2014-10-06 DIAGNOSIS — C44612 Basal cell carcinoma of skin of right upper limb, including shoulder: Secondary | ICD-10-CM | POA: Diagnosis not present

## 2014-10-06 DIAGNOSIS — X32XXXD Exposure to sunlight, subsequent encounter: Secondary | ICD-10-CM | POA: Diagnosis not present

## 2014-10-06 DIAGNOSIS — Z08 Encounter for follow-up examination after completed treatment for malignant neoplasm: Secondary | ICD-10-CM | POA: Diagnosis not present

## 2014-10-26 ENCOUNTER — Ambulatory Visit (INDEPENDENT_AMBULATORY_CARE_PROVIDER_SITE_OTHER): Payer: Medicare Other | Admitting: Family Medicine

## 2014-10-26 DIAGNOSIS — E538 Deficiency of other specified B group vitamins: Secondary | ICD-10-CM

## 2014-10-26 MED ORDER — CYANOCOBALAMIN 1000 MCG/ML IJ SOLN
1000.0000 ug | Freq: Once | INTRAMUSCULAR | Status: AC
  Administered 2014-10-26: 1000 ug via INTRAMUSCULAR

## 2014-11-02 ENCOUNTER — Telehealth: Payer: Self-pay | Admitting: Family Medicine

## 2014-11-02 MED ORDER — HYDROCODONE-ACETAMINOPHEN 5-325 MG PO TABS: 1.0000 | ORAL_TABLET | Freq: Three times a day (TID) | ORAL | Status: DC | PRN

## 2014-11-02 NOTE — Telephone Encounter (Signed)
Patient would like a re-fill on HYDROcodone-acetaminophen (NORCO/VICODIN) 5-325 MG per tablet. °

## 2014-11-02 NOTE — Telephone Encounter (Signed)
done

## 2014-11-03 NOTE — Telephone Encounter (Signed)
Called and spoke with pt and pt is aware.  

## 2014-11-09 ENCOUNTER — Ambulatory Visit (INDEPENDENT_AMBULATORY_CARE_PROVIDER_SITE_OTHER): Payer: Medicare Other | Admitting: Family Medicine

## 2014-11-09 ENCOUNTER — Encounter: Payer: Self-pay | Admitting: Family Medicine

## 2014-11-09 VITALS — BP 136/66 | HR 82 | Temp 98.7°F | Ht 71.0 in | Wt 175.0 lb

## 2014-11-09 DIAGNOSIS — J0100 Acute maxillary sinusitis, unspecified: Secondary | ICD-10-CM

## 2014-11-09 DIAGNOSIS — E119 Type 2 diabetes mellitus without complications: Secondary | ICD-10-CM

## 2014-11-09 LAB — HEMOGLOBIN A1C: HEMOGLOBIN A1C: 8.3 % — AB (ref 4.6–6.5)

## 2014-11-09 MED ORDER — AZITHROMYCIN 250 MG PO TABS: ORAL_TABLET | ORAL | Status: DC

## 2014-11-09 NOTE — Progress Notes (Signed)
Pre visit review using our clinic review tool, if applicable. No additional management support is needed unless otherwise documented below in the visit note. 

## 2014-11-09 NOTE — Progress Notes (Signed)
   Subjective:    Patient ID: Andrew Norton, male    DOB: 30-Mar-1927, 81 y.o.   MRN: [id number]  HPI Here for 4 days of low grade fevers, sinus pressure, a ST, and dry cough. Taking Mucinex and using salt water gargles.    Review of Systems  Constitutional: Positive for fever.  HENT: Positive for congestion, postnasal drip and sinus pressure.   Eyes: Negative.   Respiratory: Positive for cough. Negative for shortness of breath and wheezing.        Objective:   Physical Exam  Constitutional: He appears well-developed and well-nourished.  HENT:  Right Ear: External ear normal.  Left Ear: External ear normal.  Nose: Nose normal.  Mouth/Throat: Oropharynx is clear and moist.  Eyes: Conjunctivae are normal.  Pulmonary/Chest: Effort normal and breath sounds normal. No respiratory distress. He has no wheezes. He has no rales.  Lymphadenopathy:    He has no cervical adenopathy.          Assessment & Plan:  Sinusitis. Treat with a Zpack.

## 2014-11-10 ENCOUNTER — Ambulatory Visit: Payer: Medicare Other | Admitting: Nurse Practitioner

## 2014-11-11 ENCOUNTER — Encounter: Payer: Self-pay | Admitting: Nurse Practitioner

## 2014-11-12 MED ORDER — SITAGLIPTIN PHOSPHATE 100 MG PO TABS: 100.0000 mg | ORAL_TABLET | Freq: Every day | ORAL | Status: DC

## 2014-11-12 NOTE — Addendum Note (Signed)
Addended by: Aggie Hacker A on: 11/12/2014 05:04 PM   Modules accepted: Orders

## 2014-11-19 ENCOUNTER — Telehealth: Payer: Self-pay | Admitting: Family Medicine

## 2014-11-19 NOTE — Telephone Encounter (Signed)
I spoke with pt and he wanted to let us know that he has not started on Januvia due to cost. He will discuss this with Dr. Sarajane Jews next week during office visit, however I did give this information to Dr. Sarajane Jews, he said okay.

## 2014-11-23 ENCOUNTER — Telehealth: Payer: Self-pay | Admitting: Family Medicine

## 2014-11-23 ENCOUNTER — Ambulatory Visit (INDEPENDENT_AMBULATORY_CARE_PROVIDER_SITE_OTHER): Payer: Medicare Other | Admitting: Family Medicine

## 2014-11-23 DIAGNOSIS — L570 Actinic keratosis: Secondary | ICD-10-CM | POA: Diagnosis not present

## 2014-11-23 DIAGNOSIS — Z08 Encounter for follow-up examination after completed treatment for malignant neoplasm: Secondary | ICD-10-CM | POA: Diagnosis not present

## 2014-11-23 DIAGNOSIS — X32XXXD Exposure to sunlight, subsequent encounter: Secondary | ICD-10-CM | POA: Diagnosis not present

## 2014-11-23 DIAGNOSIS — E538 Deficiency of other specified B group vitamins: Secondary | ICD-10-CM

## 2014-11-23 DIAGNOSIS — Z85828 Personal history of other malignant neoplasm of skin: Secondary | ICD-10-CM | POA: Diagnosis not present

## 2014-11-23 DIAGNOSIS — D0461 Carcinoma in situ of skin of right upper limb, including shoulder: Secondary | ICD-10-CM | POA: Diagnosis not present

## 2014-11-23 MED ORDER — CYANOCOBALAMIN 1000 MCG/ML IJ SOLN
1000.0000 ug | Freq: Once | INTRAMUSCULAR | Status: AC
  Administered 2014-11-23: 1000 ug via INTRAMUSCULAR

## 2014-11-23 NOTE — Telephone Encounter (Signed)
Pt wanted to let Dr. Sarajane Jews know that he did not pick up script for Januvia due to cost. He will continue with Metformin & Glipizide and recheck A1c in 3 months.

## 2014-11-24 DIAGNOSIS — E1151 Type 2 diabetes mellitus with diabetic peripheral angiopathy without gangrene: Secondary | ICD-10-CM | POA: Diagnosis not present

## 2014-11-24 DIAGNOSIS — L84 Corns and callosities: Secondary | ICD-10-CM | POA: Diagnosis not present

## 2014-11-24 DIAGNOSIS — L602 Onychogryphosis: Secondary | ICD-10-CM | POA: Diagnosis not present

## 2014-11-24 NOTE — Telephone Encounter (Signed)
noted 

## 2014-12-23 ENCOUNTER — Ambulatory Visit: Payer: Medicare Other | Admitting: Nurse Practitioner

## 2014-12-24 ENCOUNTER — Encounter: Payer: Self-pay | Admitting: Nurse Practitioner

## 2014-12-24 ENCOUNTER — Ambulatory Visit (INDEPENDENT_AMBULATORY_CARE_PROVIDER_SITE_OTHER): Payer: Medicare Other | Admitting: Nurse Practitioner

## 2014-12-24 VITALS — BP 148/54 | HR 80 | Ht 70.5 in | Wt 171.0 lb

## 2014-12-24 DIAGNOSIS — R269 Unspecified abnormalities of gait and mobility: Secondary | ICD-10-CM

## 2014-12-24 DIAGNOSIS — M5010 Cervical disc disorder with radiculopathy, unspecified cervical region: Secondary | ICD-10-CM | POA: Diagnosis not present

## 2014-12-24 DIAGNOSIS — G959 Disease of spinal cord, unspecified: Secondary | ICD-10-CM

## 2014-12-24 NOTE — Patient Instructions (Signed)
Continue gabapentin 400 mg as directed does not need refills Continue to walk daily for exercise, use cane at all times for safe ambulation Follow-up yearly and when necessary

## 2014-12-24 NOTE — Progress Notes (Signed)
[facility]  PATIENT: Andrew CHAVARIN DOB: [birth year]   REASON FOR VISIT: Follow-up for cervical myelopathy, lumbar radiculopathy status post decompression surgery, neuropathic pain. gait abnormality  HISTORY FROM: Patient and wife    HISTORY OF PRESENT ILLNESS:  HISTORY:Andrew Norton is 81 years old right-handed Caucasian male, accompanied by his wife, primary care physician is Dr. Alysia Penna, he was a previous patient of Dr. love, last clinical visit was August 13 2012 He had a past medical history of diabetes, shingles involving right C7 dermatome, mild memory loss, essential tremor  He had a history of acute on chronic cervical cord compression at decompression surgery October 2009, by neurosurgeon Dr. Vertell Limber, anterior cervical decompression, fusion at C3-4, C4-5, C5-6, with allograft, and anterior cervical plate, post surgically, he developed cardiac arrhythmia, myelopathy, with profound left arm, leg weakness, had slow recovery, Since the surgery, he continues to notice burning and numbness in both hands, left greater than right, mild gait difficulty,  He was treated with gabapentin 300 mg for his neuropathic pain involving left arm, and leg He also suffered bladder urgency, occasionally bladder spasm, bowel incontinence, Post surgical MRI cervical January 2011 showed ACDF at C3, 4, C4-5, C5-6, interbody anterior plate and screw, hypodensity in the cord at C5-6, that is cystic, compatible with chronic myelomalacia, at C6 and 7, there is progression of central disc protrusion, disc osteophyte complex on the left, moderate large extruded disc fragment at T1-2, extending cranially. He had a repeat cervical MRI September 2011, which showed similar findings, but also questionable cord edema vs. myelomalacia at C4-5, He also complained of severe right lower extremity shooting pain, fell, and broken his left hip, had open reduction internal fixation 2011 by Dr.Allusio, He had  worsening low back pain, CT myelogram December 2012 showed mild multifactorial spinal stenosis at L1-2, bilateral foraminal stenosis, mild multifactorial spinal stenosis at L3, moderate disc bulging at L4-5, with bilateral subarticular recess, and foraminal stenosis, right greater than left, compression deformity of L1 and 2. He eventually underwent lumbar decompression in April 2013 by Dr. Maxie Better, Today he complains almost constant and mild to moderate left hand and left leg neuropathic pain, despite Neurontin treatment, mild worsening gait difficulty, and he is taking hydrocodone 5/325 mg twice a day, receiving epidural injection by Dr. Nelva Bush UPDATE April 27th 2015: [name] continues to have gait difficulty, he has left hand and leg pain, as long as he is sitting, he relaxes well, if he walks a lot, he has pain in his left leg, going down to his left ankle and left foot He has bladder urgency. He has bladder accident occasionally, mild constipation, taking miralex daily. He has left 4th and 5th finger paresthesia, he is taking gabapentin 100 mg 3 tablets 4 times a day, Cymbalta 20 mg was added on in his last visit in January 2015, he did not notice any difference. We have reviewed MRI cervical together: bone fusion and metal hardware from C3-C6 levels. Spinal cord atrophy and gliosis at C6 level.  C6-7: disc bulging, uncovertebral joint hypertrophy, and facet hypertrophy with moderate spinal stenosis and severe biforaminal foraminal stenosis.  C3-4, C4-5 and C5-6 there is severe biforaminal foraminal stenosis.  T1-2: disc protrusion with superior migration of disc material with slight deformation of spinal cord.  Compared to MRI on 10/26/10, there T1-2 disc protrusion has slightly increased. Otherwise no change. UPDATE 12/24/14 Andrew Norton, 81 year old male returns for follow-up. He was last seen 11/09/2013 by Dr. Krista Blue. He continues to  have mild gait difficulty left hand and left arm pain. He denies any  bladder or bowel incontinence. He is using a single-point cane to ambulate. He has not had any recent falls. His gabapentin dose was increased at his last visit and his symptoms are well controlled. In addition he is on narcotic medication through Dr. Sarajane Jews. He has no new neurologic complaints. He says he is stable.  REVIEW OF SYSTEMS: Full 14 system review of systems performed and notable only for those listed, all others are neg:  Constitutional: Fatigue  Cardiovascular: neg Ear/Nose/Throat: neg  Skin: neg Eyes: neg Respiratory: Shortness of breath with exertion Gastroitestinal: Urinary frequency  Hematology/Lymphatic: Easy bruising  Endocrine: neg Musculoskeletal: Neck pain, neck stiffness, muscle cramps walking difficulty Allergy/Immunology: neg Neurological: neg Psychiatric: neg Sleep : neg   ALLERGIES: Allergies  Allergen Reactions  . Penicillins Hives    HOME MEDICATIONS: Outpatient Prescriptions Prior to Visit  Medication Sig Dispense Refill  . aspirin 81 MG chewable tablet Chew 1 tablet (81 mg total) by mouth daily. 1 tablet 0  . cyanocobalamin (,VITAMIN B-12,) 1000 MCG/ML injection Inject 1,000 mcg into the muscle every 30 (thirty) days.    . cyclobenzaprine (FLEXERIL) 10 MG tablet TAKE 1 TABLET 3 TIMES DAILYAS NEEDED FOR MUSCLE SPASMS 270 tablet 3  . Ferrous Sulfate 220 (44 FE) MG/5ML LIQD TAKE 7 MLS BY MOUTH DAILY 473 mL 3  . finasteride (PROSCAR) 5 MG tablet Take 1 tablet (5 mg total) by mouth daily. 90 tablet 3  . gabapentin (NEURONTIN) 400 MG capsule Take 1 capsule (400 mg total) by mouth 4 (four) times daily. 360 capsule 3  . glipiZIDE (GLUCOTROL) 10 MG tablet TAKE 1 TABLET TWICE A DAY  BEFORE A MEAL 180 tablet 3  . HYDROcodone-acetaminophen (NORCO/VICODIN) 5-325 MG per tablet Take 1 tablet by mouth every 8 (eight) hours as needed for moderate pain. 90 tablet 0  . lidocaine (LIDODERM) 5 % Place 1 patch onto the skin daily. Remove & Discard patch within 12 hours or as  directed by MD 90 patch 3  . losartan (COZAAR) 50 MG tablet Take 1 tablet (50 mg total) by mouth every morning. 90 tablet 3  . metFORMIN (GLUCOPHAGE) 1000 MG tablet Take 1 tablet (1,000 mg total) by mouth 2 (two) times daily with a meal. 180 tablet 3  . polyethylene glycol powder (GLYCOLAX/MIRALAX) powder MIX 1 GRAM(1 CAPFUL) IN LIQUID OF CHOICE AND DRINK ONCE DAILY AS DIRECTED 527 g 3  . simvastatin (ZOCOR) 40 MG tablet Take 1 tablet (40 mg total) by mouth every evening. 90 tablet 3  . triamcinolone cream (KENALOG) 0.1 % Apply 1 application topically 2 (two) times daily. 30 g 0  . azithromycin (ZITHROMAX) 250 MG tablet As directed 6 tablet 0   No facility-administered medications prior to visit.    PAST MEDICAL HISTORY: Past Medical History  Diagnosis Date  . Anemia   . Diabetes mellitus   . Hyperlipidemia   . Renal insufficiency   . Paget's disease     scrotum  . Positive PPD, treated   . Hypertension     EKG, chest x ray 11/15/10 [facility],  clearance Dr Sharlene Motts on chart/ pt states on meds to "prevent hypertension from diabetes"  . Carpal tunnel syndrome     left leg sciatica  . Easy bruising   . Stroke     SPINAL CORD STROKE PER OV note 3/13  DR LOVE CLEARANCE NOTE_  ON CHART WITH RECOMMENDATIONS-   has weakness  upper extremities  . Arthritis     hips  . Hearing loss     left ear greater loss  . Cancer     skin can lesions, and non cancer skin lesions-follwed by Dr. Nevada Crane  . History of shingles     past hx. left hand    PAST SURGICAL HISTORY: Past Surgical History  Procedure Laterality Date  . Tonsillectomy    . Partial removal of scrotum    . Cervical fusion      dr Vertell Limber 2009- retained hardware  . Hip fracture surgery  11-24-10    left hip per Dr, [name]  . Colonoscopy  2-08    per Dr. Oletta Lamas, normal   . Eye surgery      bilateral cataract extraction with IOL  . Lumbar laminectomy/decompression microdiscectomy  11/07/2011    Procedure: LUMBAR  LAMINECTOMY/DECOMPRESSION MICRODISCECTOMY;  Surgeon: Johnn Hai, MD;  Location: [facility];  Service: Orthopedics;  Laterality: N/A;  Decompression of the L4 - L5 Central (X-ray)   . Total hip revision Left 11/05/2012    Procedure: Conversion of a Bipolar to a Left Total Hip Arthroplasty;  Surgeon: [name], MD;  Location: [facility];  Service: Orthopedics;  Laterality: Left;  Conversion of a Bipolar to a Left Total Hip Arthroplasty    FAMILY HISTORY: Family History  Problem Relation Age of Onset  . Tuberculosis      SOCIAL HISTORY: History   Social History  . Marital Status: Married    Spouse Name: [name]  . Number of Children: 3  . Years of Education: College-BS   Occupational History  . retired    Social History Main Topics  . Smoking status: Never Smoker   . Smokeless tobacco: Never Used  . Alcohol Use: No  . Drug Use: No  . Sexual Activity: No   Other Topics Concern  . Not on file   Social History Narrative   Pt lives at home with wife [name])   Pt is right handed   Education college-BS   Pt states that he drinks 1 cup of coffee daily, and occ may have a cup of tea or soda     PHYSICAL EXAM  Filed Vitals:   12/24/14 1005  BP: 148/54  Pulse: 80  Height: 5' 10.5" (1.791 m)  Weight: 171 lb (77.565 kg)   Body mass index is 24.18 kg/(m^2). Generalized: In no acute distress Neck: Supple, no carotid bruits mild decreased range of motion Musculoskeletal: No deformity  Neurological examination Mentation: Alert oriented to time, place, history taking, and causual conversation Cranial nerve II-XII: Pupils were equal round reactive to light extraocular movements were full, Visual field were full on confrontational test. Bilateral fundi were sharp. Facial sensation and strength were normal. Hearing was intact to finger rubbing bilaterally. Uvula tongue midline. head turning and shoulder shrug and were normal and symmetric.Tongue protrusion into cheek strength  was normal. Motor: he has mild left hip flexion weakness  and left hand grip weakness, Sensory: length dependent decreased fine touch, pinprick to knee level,,  vibratory normal  Coordination: Normal finger to nose, heel-to-shin bilaterally there was no truncal ataxia Gait: Rising up from seated position using cane, but pushing on a chair arm, cautious, atalgic, ambulates with single-point cane  Deep tendon reflexes: Brachioradialis 2/2, biceps 2/2, triceps 2/2, patellar 2/2, Achilles trace,, plantar responses were flexor bilaterally.    DIAGNOSTIC DATA (LABS, IMAGING, TESTING) - I reviewed patient records, labs, notes, testing and imaging  myself where available.  Lab Results  Component Value Date   WBC 7.6 07/14/2014   HGB 11.1* 07/14/2014   HCT 32.9* 07/14/2014   MCV 90.8 07/14/2014   PLT 266.0 07/14/2014      Component Value Date/Time   NA 138 07/14/2014 1034   K 5.9* 07/14/2014 1034   CL 104 07/14/2014 1034   CO2 25 07/14/2014 1034   GLUCOSE 159* 07/14/2014 1034   BUN 19 07/14/2014 1034   CREATININE 0.9 07/14/2014 1034   CALCIUM 9.2 07/14/2014 1034   PROT 7.1 07/14/2014 1034   ALBUMIN 4.2 07/14/2014 1034   AST 22 07/14/2014 1034   ALT 18 07/14/2014 1034   ALKPHOS 86 07/14/2014 1034   BILITOT 0.5 07/14/2014 1034   GFRNONAA 85* 11/07/2012 0442   GFRAA >90 11/07/2012 0442   Lab Results  Component Value Date   CHOL 87 07/14/2014   HDL 35.30* 07/14/2014   LDLCALC 27 07/14/2014   TRIG 123.0 07/14/2014   CHOLHDL 2 07/14/2014   Lab Results  Component Value Date   HGBA1C 8.3* 11/09/2014       ASSESSMENT AND PLAN  81 y.o. year old male  has a past medical history of cervical myelopathy, lumbar radiculopathy status post decompression surgery, continues to have intermittent left arm leg neuropathic pain, gait abnormality but symptoms are currently controlled on gabapentin  Continue gabapentin 400 mg as directed does not need refills Continue to walk daily for  exercise, use cane at all times for safe ambulation Follow-up yearly and when necessary Dennie Bible, [facility], [facility], [facility] [address] [place], [place] [address] [phone]

## 2014-12-27 ENCOUNTER — Ambulatory Visit (INDEPENDENT_AMBULATORY_CARE_PROVIDER_SITE_OTHER): Payer: Medicare Other | Admitting: Family Medicine

## 2014-12-27 DIAGNOSIS — E538 Deficiency of other specified B group vitamins: Secondary | ICD-10-CM

## 2014-12-27 MED ORDER — CYANOCOBALAMIN 1000 MCG/ML IJ SOLN
1000.0000 ug | Freq: Once | INTRAMUSCULAR | Status: AC
  Administered 2014-12-27: 1000 ug via INTRAMUSCULAR

## 2015-01-05 ENCOUNTER — Other Ambulatory Visit: Payer: Self-pay | Admitting: Family Medicine

## 2015-01-05 NOTE — Telephone Encounter (Signed)
Pt request refill HYDROcodone-acetaminophen (NORCO/VICODIN) 5-325 MG per tablet °

## 2015-01-06 NOTE — Telephone Encounter (Signed)
Pt found Rx to fill 01/02/15. He apologizes

## 2015-01-06 NOTE — Telephone Encounter (Signed)
NO he is not due until 02-01-15

## 2015-01-24 ENCOUNTER — Ambulatory Visit (INDEPENDENT_AMBULATORY_CARE_PROVIDER_SITE_OTHER): Payer: Medicare Other | Admitting: Family Medicine

## 2015-01-24 ENCOUNTER — Other Ambulatory Visit: Payer: Self-pay | Admitting: Family Medicine

## 2015-01-24 DIAGNOSIS — E538 Deficiency of other specified B group vitamins: Secondary | ICD-10-CM | POA: Diagnosis not present

## 2015-01-24 MED ORDER — CYANOCOBALAMIN 1000 MCG/ML IJ SOLN
1000.0000 ug | Freq: Once | INTRAMUSCULAR | Status: AC
  Administered 2015-01-24: 1000 ug via INTRAMUSCULAR

## 2015-01-25 ENCOUNTER — Telehealth: Payer: Self-pay | Admitting: Family Medicine

## 2015-01-25 NOTE — Telephone Encounter (Signed)
Patient gave letter to [facility] requesting a PA for Lidocaine patch.  I called [facility] and was advised by customer service that I will have to wait until 03/17/15 to submit PA.  I called and spoke to patient and verbalized understanding.

## 2015-01-28 ENCOUNTER — Telehealth: Payer: Self-pay | Admitting: Family Medicine

## 2015-01-28 MED ORDER — HYDROCODONE-ACETAMINOPHEN 5-325 MG PO TABS: 1.0000 | ORAL_TABLET | Freq: Three times a day (TID) | ORAL | Status: DC | PRN

## 2015-01-28 NOTE — Telephone Encounter (Signed)
Script is ready for pick up and I spoke with pt.  

## 2015-01-28 NOTE — Telephone Encounter (Signed)
Pt request refill HYDROcodone-acetaminophen (NORCO/VICODIN) 5-325 MG per   [place] is going to the beach in the [place]. Pt does not have enough to get through the week and this RX  Is 4 days early  Pt would like that refill today please.

## 2015-01-28 NOTE — Telephone Encounter (Signed)
done

## 2015-02-14 DIAGNOSIS — E1151 Type 2 diabetes mellitus with diabetic peripheral angiopathy without gangrene: Secondary | ICD-10-CM | POA: Diagnosis not present

## 2015-02-14 DIAGNOSIS — L602 Onychogryphosis: Secondary | ICD-10-CM | POA: Diagnosis not present

## 2015-02-24 DIAGNOSIS — H26491 Other secondary cataract, right eye: Secondary | ICD-10-CM | POA: Diagnosis not present

## 2015-02-24 DIAGNOSIS — H1851 Endothelial corneal dystrophy: Secondary | ICD-10-CM | POA: Diagnosis not present

## 2015-02-24 DIAGNOSIS — H4011X4 Primary open-angle glaucoma, indeterminate stage: Secondary | ICD-10-CM | POA: Diagnosis not present

## 2015-02-24 DIAGNOSIS — H3531 Nonexudative age-related macular degeneration: Secondary | ICD-10-CM | POA: Diagnosis not present

## 2015-02-24 DIAGNOSIS — H524 Presbyopia: Secondary | ICD-10-CM | POA: Diagnosis not present

## 2015-02-25 ENCOUNTER — Ambulatory Visit (INDEPENDENT_AMBULATORY_CARE_PROVIDER_SITE_OTHER): Payer: Medicare Other | Admitting: Family Medicine

## 2015-02-25 DIAGNOSIS — E538 Deficiency of other specified B group vitamins: Secondary | ICD-10-CM

## 2015-02-25 MED ORDER — CYANOCOBALAMIN 1000 MCG/ML IJ SOLN
1000.0000 ug | Freq: Once | INTRAMUSCULAR | Status: AC
  Administered 2015-02-25: 1000 ug via INTRAMUSCULAR

## 2015-03-16 DIAGNOSIS — H1851 Endothelial corneal dystrophy: Secondary | ICD-10-CM | POA: Diagnosis not present

## 2015-03-16 DIAGNOSIS — H524 Presbyopia: Secondary | ICD-10-CM | POA: Diagnosis not present

## 2015-03-16 DIAGNOSIS — H3531 Nonexudative age-related macular degeneration: Secondary | ICD-10-CM | POA: Diagnosis not present

## 2015-03-16 DIAGNOSIS — H26491 Other secondary cataract, right eye: Secondary | ICD-10-CM | POA: Diagnosis not present

## 2015-03-16 DIAGNOSIS — H4011X2 Primary open-angle glaucoma, moderate stage: Secondary | ICD-10-CM | POA: Diagnosis not present

## 2015-03-25 ENCOUNTER — Encounter: Payer: Self-pay | Admitting: Family Medicine

## 2015-03-25 ENCOUNTER — Ambulatory Visit (INDEPENDENT_AMBULATORY_CARE_PROVIDER_SITE_OTHER): Payer: Medicare Other | Admitting: Family Medicine

## 2015-03-25 VITALS — BP 137/95 | HR 82 | Temp 98.5°F | Ht 70.5 in | Wt 169.0 lb

## 2015-03-25 DIAGNOSIS — S39011A Strain of muscle, fascia and tendon of abdomen, initial encounter: Secondary | ICD-10-CM

## 2015-03-25 DIAGNOSIS — S39012A Strain of muscle, fascia and tendon of lower back, initial encounter: Secondary | ICD-10-CM

## 2015-03-25 DIAGNOSIS — Z23 Encounter for immunization: Secondary | ICD-10-CM | POA: Diagnosis not present

## 2015-03-25 DIAGNOSIS — E538 Deficiency of other specified B group vitamins: Secondary | ICD-10-CM

## 2015-03-25 MED ORDER — HYDROCODONE-ACETAMINOPHEN 5-325 MG PO TABS: 1.0000 | ORAL_TABLET | Freq: Three times a day (TID) | ORAL | Status: DC | PRN

## 2015-03-25 MED ORDER — CYANOCOBALAMIN 1000 MCG/ML IJ SOLN
1000.0000 ug | Freq: Once | INTRAMUSCULAR | Status: AC
  Administered 2015-03-25: 1000 ug via INTRAMUSCULAR

## 2015-03-25 NOTE — Progress Notes (Signed)
   Subjective:    Patient ID: Andrew Norton, male    DOB: 10-Nov-1926, 81 y.o.   MRN: [id number]  HPI Here to check on injuries after a fall on 03-18-15 when he lost his balance and fell in a parking lot. As he was getting into his car he slipped and fell, landing in a sitting position. No LOC or head trauma. Since then he has been stiff and sore around the mid section and the lower back. He feels fine sitting still or when walking, but twisting motions or getting into or out of bed is painful. He gets good relief with a Norco dose. No change in urinations or BMs.    Review of Systems  Constitutional: Negative.   Respiratory: Negative.   Cardiovascular: Negative.   Gastrointestinal: Negative.   Genitourinary: Negative.   Musculoskeletal: Positive for back pain.  Neurological: Negative.        Objective:   Physical Exam  Constitutional: He is oriented to person, place, and time. He appears well-developed and well-nourished.  Cardiovascular: Normal rate, regular rhythm, normal heart sounds and intact distal pulses.   Pulmonary/Chest: Effort normal and breath sounds normal.  Abdominal: Soft. Bowel sounds are normal. He exhibits no distension and no mass. There is no tenderness. There is no rebound and no guarding.  Musculoskeletal:  Mildly tender in both flanks and around both sides of the lower back. Full ROM of the spine. No ecchymoses.   Neurological: He is alert and oriented to person, place, and time.          Assessment & Plan:  He has some muscular strains of the trunk muscles and the lower back. These should heal fairly quickly. Use heat and Norco prn

## 2015-03-25 NOTE — Progress Notes (Signed)
Pre visit review using our clinic review tool, if applicable. No additional management support is needed unless otherwise documented below in the visit note. 

## 2015-04-12 ENCOUNTER — Emergency Department (HOSPITAL_COMMUNITY): Payer: Medicare Other

## 2015-04-12 ENCOUNTER — Inpatient Hospital Stay (HOSPITAL_COMMUNITY)
Admission: EM | Admit: 2015-04-12 | Discharge: 2015-04-16 | DRG: 871 | Disposition: A | Discharge status: Home Health | Payer: Medicare Other | Attending: Internal Medicine | Admitting: Internal Medicine

## 2015-04-12 ENCOUNTER — Encounter (HOSPITAL_COMMUNITY): Payer: Self-pay | Admitting: Emergency Medicine

## 2015-04-12 DIAGNOSIS — K760 Fatty (change of) liver, not elsewhere classified: Secondary | ICD-10-CM | POA: Diagnosis present

## 2015-04-12 DIAGNOSIS — E872 Acidosis, unspecified: Secondary | ICD-10-CM

## 2015-04-12 DIAGNOSIS — H9193 Unspecified hearing loss, bilateral: Secondary | ICD-10-CM | POA: Diagnosis present

## 2015-04-12 DIAGNOSIS — E118 Type 2 diabetes mellitus with unspecified complications: Secondary | ICD-10-CM | POA: Insufficient documentation

## 2015-04-12 DIAGNOSIS — N189 Chronic kidney disease, unspecified: Secondary | ICD-10-CM | POA: Diagnosis present

## 2015-04-12 DIAGNOSIS — A419 Sepsis, unspecified organism: Secondary | ICD-10-CM | POA: Diagnosis not present

## 2015-04-12 DIAGNOSIS — R7989 Other specified abnormal findings of blood chemistry: Secondary | ICD-10-CM | POA: Diagnosis present

## 2015-04-12 DIAGNOSIS — R918 Other nonspecific abnormal finding of lung field: Secondary | ICD-10-CM | POA: Diagnosis not present

## 2015-04-12 DIAGNOSIS — Z79899 Other long term (current) drug therapy: Secondary | ICD-10-CM

## 2015-04-12 DIAGNOSIS — I10 Essential (primary) hypertension: Secondary | ICD-10-CM | POA: Diagnosis not present

## 2015-04-12 DIAGNOSIS — R945 Abnormal results of liver function studies: Secondary | ICD-10-CM

## 2015-04-12 DIAGNOSIS — I129 Hypertensive chronic kidney disease with stage 1 through stage 4 chronic kidney disease, or unspecified chronic kidney disease: Secondary | ICD-10-CM | POA: Diagnosis present

## 2015-04-12 DIAGNOSIS — J181 Lobar pneumonia, unspecified organism: Secondary | ICD-10-CM | POA: Diagnosis not present

## 2015-04-12 DIAGNOSIS — M13859 Other specified arthritis, unspecified hip: Secondary | ICD-10-CM | POA: Diagnosis present

## 2015-04-12 DIAGNOSIS — Z880 Allergy status to penicillin: Secondary | ICD-10-CM

## 2015-04-12 DIAGNOSIS — Z96642 Presence of left artificial hip joint: Secondary | ICD-10-CM | POA: Diagnosis present

## 2015-04-12 DIAGNOSIS — R55 Syncope and collapse: Secondary | ICD-10-CM | POA: Diagnosis present

## 2015-04-12 DIAGNOSIS — Z79891 Long term (current) use of opiate analgesic: Secondary | ICD-10-CM | POA: Diagnosis not present

## 2015-04-12 DIAGNOSIS — R251 Tremor, unspecified: Secondary | ICD-10-CM | POA: Diagnosis present

## 2015-04-12 DIAGNOSIS — E119 Type 2 diabetes mellitus without complications: Secondary | ICD-10-CM | POA: Diagnosis not present

## 2015-04-12 DIAGNOSIS — Z8673 Personal history of transient ischemic attack (TIA), and cerebral infarction without residual deficits: Secondary | ICD-10-CM | POA: Diagnosis not present

## 2015-04-12 DIAGNOSIS — J690 Pneumonitis due to inhalation of food and vomit: Secondary | ICD-10-CM

## 2015-04-12 DIAGNOSIS — E1122 Type 2 diabetes mellitus with diabetic chronic kidney disease: Secondary | ICD-10-CM | POA: Diagnosis present

## 2015-04-12 DIAGNOSIS — Z7982 Long term (current) use of aspirin: Secondary | ICD-10-CM

## 2015-04-12 DIAGNOSIS — J189 Pneumonia, unspecified organism: Secondary | ICD-10-CM | POA: Diagnosis present

## 2015-04-12 DIAGNOSIS — E785 Hyperlipidemia, unspecified: Secondary | ICD-10-CM | POA: Diagnosis present

## 2015-04-12 DIAGNOSIS — J90 Pleural effusion, not elsewhere classified: Secondary | ICD-10-CM | POA: Diagnosis not present

## 2015-04-12 DIAGNOSIS — R600 Localized edema: Secondary | ICD-10-CM | POA: Diagnosis not present

## 2015-04-12 DIAGNOSIS — D649 Anemia, unspecified: Secondary | ICD-10-CM | POA: Diagnosis present

## 2015-04-12 LAB — BASIC METABOLIC PANEL
ANION GAP: 8 (ref 5–15)
BUN: 22 mg/dL — ABNORMAL HIGH (ref 6–20)
CHLORIDE: 105 mmol/L (ref 101–111)
CO2: 22 mmol/L (ref 22–32)
Calcium: 8.3 mg/dL — ABNORMAL LOW (ref 8.9–10.3)
Creatinine, Ser: 1 mg/dL (ref 0.61–1.24)
GFR calc Af Amer: >60 mL/min (ref >60)
GFR calc non Af Amer: >60 mL/min (ref >60)
GLUCOSE: 257 mg/dL — AB (ref 65–99)
POTASSIUM: 4.3 mmol/L (ref 3.5–5.1)
Sodium: 135 mmol/L (ref 135–145)

## 2015-04-12 LAB — TSH: TSH: 2.379 u[IU]/mL (ref 0.350–4.500)

## 2015-04-12 LAB — CBC WITH DIFFERENTIAL/PLATELET
BASOS ABS: 0 10*3/uL (ref 0.0–0.1)
Basophils Relative: 0 %
Eosinophils Absolute: 0 10*3/uL (ref 0.0–0.7)
Eosinophils Relative: 0 %
HEMATOCRIT: 31.3 % — AB (ref 39.0–52.0)
Hemoglobin: 10.4 g/dL — ABNORMAL LOW (ref 13.0–17.0)
LYMPHS ABS: 0.2 10*3/uL — AB (ref 0.7–4.0)
LYMPHS PCT: 4 %
MCH: 30.9 pg (ref 26.0–34.0)
MCHC: 33.2 g/dL (ref 30.0–36.0)
MCV: 92.9 fL (ref 78.0–100.0)
Monocytes Absolute: 0.1 10*3/uL (ref 0.1–1.0)
Monocytes Relative: 2 %
NEUTROS ABS: 4.1 10*3/uL (ref 1.7–7.7)
Neutrophils Relative %: 94 %
Platelets: 182 10*3/uL (ref 150–400)
RBC: 3.37 MIL/uL — AB (ref 4.22–5.81)
RDW: 13.3 % (ref 11.5–15.5)
WBC: 4.4 10*3/uL (ref 4.0–10.5)

## 2015-04-12 LAB — I-STAT CG4 LACTIC ACID, ED: Lactic Acid, Venous: 4.05 mmol/L (ref 0.5–2.0)

## 2015-04-12 MED ORDER — SODIUM CHLORIDE 0.9 % IV SOLN
3.0000 g | Freq: Once | INTRAVENOUS | Status: DC
  Administered 2015-04-13: 3 g via INTRAVENOUS
  Filled 2015-04-12: qty 3

## 2015-04-12 MED ORDER — SODIUM CHLORIDE 0.9 % IV BOLUS (SEPSIS)
1000.0000 mL | Freq: Once | INTRAVENOUS | Status: AC
  Administered 2015-04-13: 1000 mL via INTRAVENOUS

## 2015-04-12 NOTE — ED Notes (Signed)
Lactic Acid given to MD. 

## 2015-04-12 NOTE — ED Provider Notes (Signed)
CSN: [id number]     Arrival date & time 04/12/15  2132 History   First MD Initiated Contact with Patient 04/12/15 2148     Chief Complaint  Patient presents with  . Tremors     (Consider location/radiation/quality/duration/timing/severity/associated sxs/prior Treatment) Patient is a 81 y.o. male presenting with neurologic complaint. The history is provided by the patient.  Neurologic Problem This is a new problem. The current episode started 1 to 2 hours ago. The problem occurs constantly. Associated symptoms include shortness of breath (mild tonight). Pertinent negatives include no chest pain and no abdominal pain. Nothing aggravates the symptoms. Nothing relieves the symptoms. He has tried nothing for the symptoms.    2x syncopal episodes per family who state 1 week ago Pt passed out after exertion at the beach, 3 weeks ago lost consciousness briefly at a restaurant.  Past Medical History  Diagnosis Date  . Anemia   . Diabetes mellitus   . Hyperlipidemia   . Renal insufficiency   . Paget's disease     scrotum  . Positive PPD, treated   . Hypertension     EKG, chest x ray 11/15/10 EPIC,  clearance Dr Sharlene Motts on chart/ pt states on meds to "prevent hypertension from diabetes"  . Carpal tunnel syndrome     left leg sciatica  . Easy bruising   . Stroke     SPINAL CORD STROKE PER OV note 3/13  DR LOVE CLEARANCE NOTE_  ON CHART WITH RECOMMENDATIONS-   has weakness  upper extremities  . Arthritis     hips  . Hearing loss     left ear greater loss  . Cancer     skin can lesions, and non cancer skin lesions-follwed by Dr. Nevada Crane  . History of shingles     past hx. left hand   Past Surgical History  Procedure Laterality Date  . Tonsillectomy    . Partial removal of scrotum    . Cervical fusion      dr Vertell Limber 2009- retained hardware  . Hip fracture surgery  11-24-10    left hip per Dr, [name]  . Colonoscopy  2-08    per Dr. Oletta Lamas, normal   . Eye surgery      bilateral  cataract extraction with IOL  . Lumbar laminectomy/decompression microdiscectomy  11/07/2011    Procedure: LUMBAR LAMINECTOMY/DECOMPRESSION MICRODISCECTOMY;  Surgeon: Johnn Hai, MD;  Location: [facility];  Service: Orthopedics;  Laterality: N/A;  Decompression of the L4 - L5 Central (X-ray)   . Total hip revision Left 11/05/2012    Procedure: Conversion of a Bipolar to a Left Total Hip Arthroplasty;  Surgeon: [name], MD;  Location: [facility];  Service: Orthopedics;  Laterality: Left;  Conversion of a Bipolar to a Left Total Hip Arthroplasty   Family History  Problem Relation Age of Onset  . Tuberculosis     Social History  Substance Use Topics  . Smoking status: Never Smoker   . Smokeless tobacco: Never Used  . Alcohol Use: No    Review of Systems  Respiratory: Positive for shortness of breath (mild tonight).   Cardiovascular: Negative for chest pain.  Gastrointestinal: Negative for abdominal pain.  All other systems reviewed and are negative.     Allergies  Penicillins  Home Medications   Prior to Admission medications   Medication Sig Start Date End Date Taking? Authorizing Provider  aspirin 81 MG chewable tablet Chew 1 tablet (81 mg total) by mouth daily.  11/24/12   Laurey Morale, MD  cyanocobalamin (,VITAMIN B-12,) 1000 MCG/ML injection Inject 1,000 mcg into the muscle every 30 (thirty) days.    Historical Provider, MD  cyclobenzaprine (FLEXERIL) 10 MG tablet TAKE 1 TABLET 3 TIMES DAILYAS NEEDED FOR MUSCLE SPASMS 07/14/14   Laurey Morale, MD  Ferrous Sulfate 220 (44 FE) MG/5ML LIQD TAKE 7 MLS BY MOUTH DAILY 07/14/14   Laurey Morale, MD  finasteride (PROSCAR) 5 MG tablet Take 1 tablet (5 mg total) by mouth daily. 07/14/14   Laurey Morale, MD  gabapentin (NEURONTIN) 400 MG capsule Take 1 capsule (400 mg total) by mouth 4 (four) times daily. 07/28/14   Laurey Morale, MD  glipiZIDE (GLUCOTROL) 10 MG tablet TAKE 1 TABLET TWICE A DAY  BEFORE A MEAL 07/14/14   Laurey Morale,  MD  HYDROcodone-acetaminophen (NORCO/VICODIN) 5-325 MG per tablet Take 1 tablet by mouth every 8 (eight) hours as needed for moderate pain. 03/25/15   Laurey Morale, MD  lidocaine (LIDODERM) 5 % Place 1 patch onto the skin daily. Remove & Discard patch within 12 hours or as directed by MD 07/28/14   Laurey Morale, MD  losartan (COZAAR) 50 MG tablet Take 1 tablet (50 mg total) by mouth every morning. 07/14/14   Laurey Morale, MD  metFORMIN (GLUCOPHAGE) 1000 MG tablet Take 1 tablet (1,000 mg total) by mouth 2 (two) times daily with a meal. 07/20/14   Laurey Morale, MD  polyethylene glycol powder (GLYCOLAX/MIRALAX) powder MIX 1 GRAM(1 CAPFUL) IN LIQUID OF CHOICE AND DRINK ONCE DAILY AS DIRECTED 07/14/14   Laurey Morale, MD  simvastatin (ZOCOR) 40 MG tablet Take 1 tablet (40 mg total) by mouth every evening. 07/14/14   Laurey Morale, MD  triamcinolone cream (KENALOG) 0.1 % Apply 1 application topically 2 (two) times daily. 09/29/14   Laurey Morale, MD   BP 151/66 mmHg  Pulse 112  Temp(Src) 98.2 F (36.8 C) (Oral)  Resp 18  SpO2 92% Physical Exam  Constitutional: He is oriented to person, place, and time. He appears well-developed and well-nourished. No distress.  HENT:  Head: Normocephalic and atraumatic.  Eyes: Conjunctivae are normal.  Neck: Neck supple. No tracheal deviation present.  Cardiovascular: Normal rate and regular rhythm.   Pulmonary/Chest: Effort normal. No respiratory distress.  Abdominal: Soft. He exhibits no distension.  Neurological: He is alert and oriented to person, place, and time. He displays tremor (fine motor worse with volitional movement ). No cranial nerve deficit. GCS eye subscore is 4. GCS verbal subscore is 5. GCS motor subscore is 6.  Skin: Skin is warm and dry.  Psychiatric: He has a normal mood and affect.    ED Course  Procedures (including critical care time) Labs Review Labs Reviewed  CBC WITH DIFFERENTIAL/PLATELET - Abnormal; Notable for the following:     RBC 3.37 (*)    Hemoglobin 10.4 (*)    HCT 31.3 (*)    Lymphs Abs 0.2 (*)    All other components within normal limits  BASIC METABOLIC PANEL - Abnormal; Notable for the following:    Glucose, Bld 257 (*)    BUN 22 (*)    Calcium 8.3 (*)    All other components within normal limits  I-STAT CG4 LACTIC ACID, ED - Abnormal; Notable for the following:    Lactic Acid, Venous 4.05 (*)    All other components within normal limits  TSH  URINALYSIS, ROUTINE W REFLEX MICROSCOPIC (  NOT AT [facility])  Randolm Idol, ED    Imaging Review Dg Chest 2 View  04/12/2015   CLINICAL DATA:  The patient was working around the house and started feeling very shaky today.  EXAM: CHEST  2 VIEW  COMPARISON:  October 23, 2012  FINDINGS: The heart size and mediastinal contours are within normal limits. There is patchy opacity of right lung base. There is no pulmonary edema or pleural effusion. The visualized skeletal structures are stable.  IMPRESSION: Patchy opacity of the right lung base suspicious for developing pneumonia.   Electronically Signed   By: Abelardo Diesel M.D.   On: 04/12/2015 22:15   I have personally reviewed and evaluated these images and lab results as part of my medical decision-making.   EKG Interpretation   Date/Time:  Tuesday April 12 2015 22:50:48 EDT Ventricular Rate:  103 PR Interval:  154 QRS Duration: 104 QT Interval:  331 QTC Calculation: 433 R Axis:   -26 Text Interpretation:  Sinus tachycardia Borderline left axis deviation  Minimal ST depression, lateral leads Confirmed by KNOTT MD, Quillian Quince ([id number])  on 04/12/2015 10:57:40 PM      MDM   Final diagnoses:  Tremor of both hands  Lactic acidosis  Syncope and collapse  Aspiration pneumonia, unspecified aspiration pneumonia type    81 year old male presents with onset of tremors 2 hours prior to arrival in emergency department. He was loading the dishwasher after dinner and began shaking uncontrollably worse with moving  around. Initial screening labs were initiated for evaluation of infection, metabolic or hematologic derangement as patient is tachycardic on arrival. After initial exam the patient's family recalled that he had 2 syncopal episodes in the last month. The last episode occurred while he was overexerted at the beach and he syncopized, the prior one was after eating while at a restaurant. The episodes resolve spontaneously. No focus of arrhythmia on EKG, patient has signs of dehydration with mild uremia and lactic acidosis, evidence of aspiration pneumonia on chest x-ray with stated history of syncope recently. No syncope tonight.  Patient has stated history of rash allergy to penicillins but has not received this since 1940s so was given Unasyn to cover aspiration pneumonia, fluids to resuscitate likely dehydration and will plan admission to the hospital for further observation and monitoring. CT head was ordered for evaluation of intracranial lesion as etiology of syncopal episodes.    Leo Grosser, MD 04/13/15 [phone]

## 2015-04-12 NOTE — ED Notes (Signed)
Pt states that he was working around the house and started getting very shakey, particularly in his arms. Neuro intact. Alert and oriented.

## 2015-04-12 NOTE — H&P (Addendum)
[facility] History and Physical  Andrew Norton RSW:[id number] DOB: August 31, 1926 DOA: 04/12/2015  Referring physician: Laurin Coder, MD PCP: Laurey Morale, MD   Chief Complaint: Shaking tremors  HPI: Andrew Norton is a 81 y.o. male with history of DM Type II HTN HLD CKD Anemia presents with weakness. The patients son states that he had an episode of trembling. It seemed to be present in the arms. He was fully conscious at that time. He is not sure if he felt feverish or hot. He had no sweating. This episode lasted from around 8PM to 10PM. He came into the ED for this reason. In the ED he wass noted to have some difficulty breaething and has been having some dizziness also. He states that he has not been dizzy before. He did not have any syncope but he has had two spells of syncope in the last 3 weeks. No chest pain noted. He has no cough and no congstion noted. He has no headahce noted. He does have some issues with his prostate and is on medication for this. In the ED he had a CXR which does show pneumonia with an elevated lactate.   Review of Systems:  12 point ROS performed and is unremarkable other than HPI.   Past Medical History  Diagnosis Date  . Anemia   . Diabetes mellitus   . Hyperlipidemia   . Renal insufficiency   . Paget's disease     scrotum  . Positive PPD, treated   . Hypertension     EKG, chest x ray 11/15/10 EPIC,  clearance Dr Sharlene Motts on chart/ pt states on meds to "prevent hypertension from diabetes"  . Carpal tunnel syndrome     left leg sciatica  . Easy bruising   . Stroke     SPINAL CORD STROKE PER OV note 3/13  DR LOVE CLEARANCE NOTE_  ON CHART WITH RECOMMENDATIONS-   has weakness  upper extremities  . Arthritis     hips  . Hearing loss     left ear greater loss  . Cancer     skin can lesions, and non cancer skin lesions-follwed by Dr. [place] Crane  . History of shingles     past hx. left hand   Past Surgical History  Procedure Laterality Date  . Tonsillectomy     . Partial removal of scrotum    . Cervical fusion      dr Vertell Limber 2009- retained hardware  . Hip fracture surgery  11-24-10    left hip per Dr, [name]  . Colonoscopy  2-08    per Dr. Oletta Lamas, normal   . Eye surgery      bilateral cataract extraction with IOL  . Lumbar laminectomy/decompression microdiscectomy  11/07/2011    Procedure: LUMBAR LAMINECTOMY/DECOMPRESSION MICRODISCECTOMY;  Surgeon: Johnn Hai, MD;  Location: [facility];  Service: Orthopedics;  Laterality: N/A;  Decompression of the L4 - L5 Central (X-ray)   . Total hip revision Left 11/05/2012    Procedure: Conversion of a Bipolar to a Left Total Hip Arthroplasty;  Surgeon: [name], MD;  Location: [facility];  Service: Orthopedics;  Laterality: Left;  Conversion of a Bipolar to a Left Total Hip Arthroplasty   Social History:  reports that he has never smoked. He has never used smokeless tobacco. He reports that he does not drink alcohol or use illicit drugs.  Allergies  Allergen Reactions  . Penicillins Hives    Has patient had a PCN reaction  causing immediate rash, facial/tongue/throat swelling, SOB or lightheadedness with hypotension: No Has patient had a PCN reaction causing severe rash involving mucus membranes or skin necrosis: No Has patient had a PCN reaction that required hospitalization No Has patient had a PCN reaction occurring within the last 10 years: Yes If all of the above answers are "NO", then may proceed with Cephalosporin use.     Family History  Problem Relation Age of Onset  . Tuberculosis       Prior to Admission medications   Medication Sig Start Date End Date Taking? Authorizing Provider  aspirin 81 MG chewable tablet Chew 1 tablet (81 mg total) by mouth daily. 11/24/12  Yes Laurey Morale, MD  cyclobenzaprine (FLEXERIL) 10 MG tablet TAKE 1 TABLET 3 TIMES DAILYAS NEEDED FOR MUSCLE SPASMS Patient taking differently: 10 mg at bedtime as needed for muscle spasms.  07/14/14  Yes Laurey Morale, MD  Ferrous Sulfate 220 (44 FE) MG/5ML LIQD TAKE 7 MLS BY MOUTH DAILY 07/14/14  Yes Laurey Morale, MD  finasteride (PROSCAR) 5 MG tablet Take 1 tablet (5 mg total) by mouth daily. 07/14/14  Yes Laurey Morale, MD  gabapentin (NEURONTIN) 400 MG capsule Take 1 capsule (400 mg total) by mouth 4 (four) times daily. 07/28/14  Yes Laurey Morale, MD  glipiZIDE (GLUCOTROL) 10 MG tablet TAKE 1 TABLET TWICE A DAY  BEFORE A MEAL 07/14/14  Yes Laurey Morale, MD  HYDROcodone-acetaminophen (NORCO/VICODIN) 5-325 MG per tablet Take 1 tablet by mouth every 8 (eight) hours as needed for moderate pain. 03/25/15  Yes Laurey Morale, MD  lidocaine (LIDODERM) 5 % Place 1 patch onto the skin daily. Remove & Discard patch within 12 hours or as directed by MD 07/28/14  Yes Laurey Morale, MD  losartan (COZAAR) 50 MG tablet Take 1 tablet (50 mg total) by mouth every morning. 07/14/14  Yes Laurey Morale, MD  metFORMIN (GLUCOPHAGE) 1000 MG tablet Take 1 tablet (1,000 mg total) by mouth 2 (two) times daily with a meal. 07/20/14  Yes Laurey Morale, MD  polyethylene glycol powder (GLYCOLAX/MIRALAX) powder MIX 1 GRAM(1 CAPFUL) IN LIQUID OF CHOICE AND DRINK ONCE DAILY AS DIRECTED 07/14/14  Yes Laurey Morale, MD  simvastatin (ZOCOR) 40 MG tablet Take 1 tablet (40 mg total) by mouth every evening. 07/14/14  Yes Laurey Morale, MD  cyanocobalamin (,VITAMIN B-12,) 1000 MCG/ML injection Inject 1,000 mcg into the muscle every 30 (thirty) days.    Historical Provider, MD  triamcinolone cream (KENALOG) 0.1 % Apply 1 application topically 2 (two) times daily. Patient not taking: Reported on 04/12/2015 09/29/14   Laurey Morale, MD   Physical Exam: Filed Vitals:   04/12/15 2138 04/12/15 2230 04/12/15 2300  BP: 151/66 145/50 140/55  Pulse: 112 103 103  Temp: 98.2 F (36.8 C)    TempSrc: Oral    Resp: 18 21 24   SpO2: 92% 98% 95%    Wt Readings from Last 3 Encounters:  03/25/15 76.658 kg (169 lb)  12/24/14 77.565 kg (171 lb)  11/09/14  79.379 kg (175 lb)    General:  Appears calm and comfortable Eyes: PERRL, normal lids, irises & conjunctiva ENT: grossly normal hearing, lips & tongue Neck: no LAD, masses or thyromegaly Cardiovascular: RRR, no m/r/g. No LE edema. Telemetry: SR, no arrhythmias  Respiratory: CTA bilaterally, no w/r/r Abdomen: soft, ntnd Skin: no rash or induration seen on limited exam Musculoskeletal: grossly normal tone BUE/BLE Psychiatric: grossly  normal mood and affect Neurologic: grossly non-focal.          Labs on Admission:  Basic Metabolic Panel:  Recent Labs Lab 04/12/15 2222  NA 135  K 4.3  CL 105  CO2 22  GLUCOSE 257*  BUN 22*  CREATININE 1.00  CALCIUM 8.3*   Liver Function Tests: No results for input(s): AST, ALT, ALKPHOS, BILITOT, PROT, ALBUMIN in the last 168 hours. No results for input(s): LIPASE, AMYLASE in the last 168 hours. No results for input(s): AMMONIA in the last 168 hours. CBC:  Recent Labs Lab 04/12/15 2222  WBC 4.4  NEUTROABS 4.1  HGB 10.4*  HCT 31.3*  MCV 92.9  PLT 182   Cardiac Enzymes: No results for input(s): CKTOTAL, CKMB, CKMBINDEX, TROPONINI in the last 168 hours.  BNP (last 3 results) No results for input(s): BNP in the last 8760 hours.  ProBNP (last 3 results) No results for input(s): PROBNP in the last 8760 hours.  CBG: No results for input(s): GLUCAP in the last 168 hours.  Radiological Exams on Admission: Dg Chest 2 View  04/12/2015   CLINICAL DATA:  The patient was working around the house and started feeling very shaky today.  EXAM: CHEST  2 VIEW  COMPARISON:  October 23, 2012  FINDINGS: The heart size and mediastinal contours are within normal limits. There is patchy opacity of right lung base. There is no pulmonary edema or pleural effusion. The visualized skeletal structures are stable.  IMPRESSION: Patchy opacity of the right lung base suspicious for developing pneumonia.   Electronically Signed   By: Abelardo Diesel M.D.   On:  04/12/2015 22:15      Assessment/Plan Active Problems:   Diabetes mellitus without complication   Essential hypertension   Aspiration pneumonia   Sepsis   Hyperlipidemia   1. Probable Sepsis/Pneumonia -the shaking he describes most likely were chills and shivering -will start on antibiotics rocephin vanc and azithromycin for now -will get blood cultures -will check urine strep and legionella antigen -will follow CXR as needed -fluid bolus -will follow lactate  2. HTN -will monitor pressures -continue with losartan   3. Hyperlipidemia -on statins  4. DM Type II -will continue baseline oral agents -check A1C -will start on FSBS with SSI coverage  5. Anemia -appears to be at baseline   Code Status: full code (must indicate code status--if unknown or must be presumed, indicate so) DVT Prophylaxis:heparin Family Communication: none (indicate person spoken with, if applicable, with phone number if by telephone) Disposition Plan: home (indicate anticipated LOS)    [facility] Pager [phone]

## 2015-04-13 ENCOUNTER — Encounter (HOSPITAL_COMMUNITY): Payer: Self-pay | Admitting: Emergency Medicine

## 2015-04-13 ENCOUNTER — Inpatient Hospital Stay (HOSPITAL_COMMUNITY): Payer: Medicare Other

## 2015-04-13 DIAGNOSIS — E119 Type 2 diabetes mellitus without complications: Secondary | ICD-10-CM

## 2015-04-13 DIAGNOSIS — J690 Pneumonitis due to inhalation of food and vomit: Secondary | ICD-10-CM

## 2015-04-13 DIAGNOSIS — J181 Lobar pneumonia, unspecified organism: Secondary | ICD-10-CM

## 2015-04-13 LAB — GLUCOSE, CAPILLARY
GLUCOSE-CAPILLARY: 128 mg/dL — AB (ref 65–99)
GLUCOSE-CAPILLARY: 66 mg/dL (ref 65–99)
GLUCOSE-CAPILLARY: 56 mg/dL — AB (ref 65–99)
GLUCOSE-CAPILLARY: 116 mg/dL — AB (ref 65–99)
Glucose-Capillary: 172 mg/dL — ABNORMAL HIGH (ref 65–99)
Glucose-Capillary: 163 mg/dL — ABNORMAL HIGH (ref 65–99)
Glucose-Capillary: 109 mg/dL — ABNORMAL HIGH (ref 65–99)

## 2015-04-13 LAB — URINALYSIS, ROUTINE W REFLEX MICROSCOPIC
Bilirubin Urine: NEGATIVE
HGB URINE DIPSTICK: NEGATIVE
KETONES UR: NEGATIVE mg/dL
Leukocytes, UA: NEGATIVE
Nitrite: NEGATIVE
PH: 6 (ref 5.0–8.0)
PROTEIN: NEGATIVE mg/dL
Specific Gravity, Urine: 1.021 (ref 1.005–1.030)
Urobilinogen, UA: 1 mg/dL (ref 0.0–1.0)

## 2015-04-13 LAB — CBC WITH DIFFERENTIAL/PLATELET
BASOS ABS: 0 10*3/uL (ref 0.0–0.1)
BASOS PCT: 0 %
EOS ABS: 0 10*3/uL (ref 0.0–0.7)
EOS PCT: 0 %
HCT: 28.1 % — ABNORMAL LOW (ref 39.0–52.0)
Hemoglobin: 9.3 g/dL — ABNORMAL LOW (ref 13.0–17.0)
Lymphocytes Relative: 4 %
Lymphs Abs: 0.3 10*3/uL — ABNORMAL LOW (ref 0.7–4.0)
MCH: 30.9 pg (ref 26.0–34.0)
MCHC: 33.1 g/dL (ref 30.0–36.0)
MCV: 93.4 fL (ref 78.0–100.0)
MONO ABS: 0.3 10*3/uL (ref 0.1–1.0)
Monocytes Relative: 4 %
NEUTROS ABS: 6.2 10*3/uL (ref 1.7–7.7)
Neutrophils Relative %: 92 %
PLATELETS: 167 10*3/uL (ref 150–400)
RBC: 3.01 MIL/uL — ABNORMAL LOW (ref 4.22–5.81)
RDW: 13.4 % (ref 11.5–15.5)
WBC: 6.8 10*3/uL (ref 4.0–10.5)

## 2015-04-13 LAB — COMPREHENSIVE METABOLIC PANEL
ALBUMIN: 3.5 g/dL (ref 3.5–5.0)
ALBUMIN: 3.2 g/dL — AB (ref 3.5–5.0)
ALK PHOS: 164 U/L — AB (ref 38–126)
ALT: 653 U/L — ABNORMAL HIGH (ref 17–63)
ALT: 597 U/L — ABNORMAL HIGH (ref 17–63)
AST: 1146 U/L — AB (ref 15–41)
AST: 820 U/L — AB (ref 15–41)
Alkaline Phosphatase: 191 U/L — ABNORMAL HIGH (ref 38–126)
Anion gap: 6 (ref 5–15)
Anion gap: 7 (ref 5–15)
BILIRUBIN TOTAL: 1.7 mg/dL — AB (ref 0.3–1.2)
BUN: 22 mg/dL — AB (ref 6–20)
BUN: 20 mg/dL (ref 6–20)
CHLORIDE: 107 mmol/L (ref 101–111)
CO2: 22 mmol/L (ref 22–32)
CO2: 22 mmol/L (ref 22–32)
Calcium: 8 mg/dL — ABNORMAL LOW (ref 8.9–10.3)
Calcium: 7.5 mg/dL — ABNORMAL LOW (ref 8.9–10.3)
Chloride: 106 mmol/L (ref 101–111)
Creatinine, Ser: 0.93 mg/dL (ref 0.61–1.24)
Creatinine, Ser: 0.81 mg/dL (ref 0.61–1.24)
GFR calc Af Amer: >60 mL/min (ref >60)
GFR calc Af Amer: >60 mL/min (ref >60)
GFR calc non Af Amer: >60 mL/min (ref >60)
GFR calc non Af Amer: >60 mL/min (ref >60)
GLUCOSE: 164 mg/dL — AB (ref 65–99)
GLUCOSE: 177 mg/dL — AB (ref 65–99)
POTASSIUM: 3.5 mmol/L (ref 3.5–5.1)
POTASSIUM: 3.8 mmol/L (ref 3.5–5.1)
SODIUM: 135 mmol/L (ref 135–145)
Sodium: 135 mmol/L (ref 135–145)
TOTAL PROTEIN: 5.7 g/dL — AB (ref 6.5–8.1)
Total Bilirubin: 1.4 mg/dL — ABNORMAL HIGH (ref 0.3–1.2)
Total Protein: 6.4 g/dL — ABNORMAL LOW (ref 6.5–8.1)

## 2015-04-13 LAB — CBC
HCT: 27.1 % — ABNORMAL LOW (ref 39.0–52.0)
Hemoglobin: 9.1 g/dL — ABNORMAL LOW (ref 13.0–17.0)
MCH: 30.8 pg (ref 26.0–34.0)
MCHC: 33.6 g/dL (ref 30.0–36.0)
MCV: 91.9 fL (ref 78.0–100.0)
Platelets: 158 10*3/uL (ref 150–400)
RBC: 2.95 MIL/uL — AB (ref 4.22–5.81)
RDW: 13.2 % (ref 11.5–15.5)
WBC: 9.4 10*3/uL (ref 4.0–10.5)

## 2015-04-13 LAB — APTT: aPTT: 29 seconds (ref 24–37)

## 2015-04-13 LAB — PROTIME-INR
INR: 1.21 (ref 0.00–1.49)
PROTHROMBIN TIME: 15.4 s — AB (ref 11.6–15.2)

## 2015-04-13 LAB — URINE MICROSCOPIC-ADD ON

## 2015-04-13 LAB — STREP PNEUMONIAE URINARY ANTIGEN: Strep Pneumo Urinary Antigen: NEGATIVE

## 2015-04-13 LAB — LACTIC ACID, PLASMA
Lactic Acid, Venous: 4 mmol/L (ref 0.5–2.0)
Lactic Acid, Venous: 2.6 mmol/L (ref 0.5–2.0)
Lactic Acid, Venous: 2.4 mmol/L (ref 0.5–2.0)
Lactic Acid, Venous: 2 mmol/L (ref 0.5–2.0)

## 2015-04-13 LAB — TSH: TSH: 2.086 u[IU]/mL (ref 0.350–4.500)

## 2015-04-13 LAB — PROCALCITONIN: PROCALCITONIN: 7.63 ng/mL

## 2015-04-13 MED ORDER — SIMVASTATIN 40 MG PO TABS: 40.0000 mg | ORAL_TABLET | Freq: Every evening | ORAL | Status: DC

## 2015-04-13 MED ORDER — ACETAMINOPHEN 325 MG PO TABS: 650.0000 mg | ORAL_TABLET | Freq: Four times a day (QID) | ORAL | Status: DC | PRN

## 2015-04-13 MED ORDER — FOLIC ACID 1 MG PO TABS
1.0000 mg | ORAL_TABLET | Freq: Every day | ORAL | Status: DC
  Administered 2015-04-13 – 2015-04-16 (×3): 1 mg via ORAL
  Filled 2015-04-13 (×3): qty 1

## 2015-04-13 MED ORDER — HEPARIN SODIUM (PORCINE) 5000 UNIT/ML IJ SOLN
5000.0000 [IU] | Freq: Three times a day (TID) | INTRAMUSCULAR | Status: DC
  Administered 2015-04-13 – 2015-04-16 (×10): 5000 [IU] via SUBCUTANEOUS
  Filled 2015-04-13 (×10): qty 1

## 2015-04-13 MED ORDER — SODIUM CHLORIDE 0.9 % IV SOLN
INTRAVENOUS | Status: DC
  Administered 2015-04-13 – 2015-04-15 (×3): via INTRAVENOUS

## 2015-04-13 MED ORDER — INSULIN ASPART 100 UNIT/ML ~~LOC~~ SOLN
0.0000 [IU] | Freq: Three times a day (TID) | SUBCUTANEOUS | Status: DC
  Administered 2015-04-13: 3 [IU] via SUBCUTANEOUS
  Administered 2015-04-13: 2 [IU] via SUBCUTANEOUS
  Administered 2015-04-14 (×2): 3 [IU] via SUBCUTANEOUS
  Administered 2015-04-15: 5 [IU] via SUBCUTANEOUS
  Administered 2015-04-15: 2 [IU] via SUBCUTANEOUS
  Administered 2015-04-15 – 2015-04-16 (×2): 3 [IU] via SUBCUTANEOUS

## 2015-04-13 MED ORDER — GLIPIZIDE 10 MG PO TABS
10.0000 mg | ORAL_TABLET | Freq: Two times a day (BID) | ORAL | Status: DC
  Administered 2015-04-13: 10 mg via ORAL
  Filled 2015-04-13: qty 1

## 2015-04-13 MED ORDER — SODIUM CHLORIDE 0.9 % IV BOLUS (SEPSIS)
1000.0000 mL | Freq: Once | INTRAVENOUS | Status: AC
Start: 2015-04-13 — End: 2015-04-13
  Administered 2015-04-13: 1000 mL via INTRAVENOUS

## 2015-04-13 MED ORDER — SODIUM CHLORIDE 0.9 % IV BOLUS (SEPSIS)
500.0000 mL | INTRAVENOUS | Status: AC
  Administered 2015-04-13: 500 mL via INTRAVENOUS

## 2015-04-13 MED ORDER — ACETAMINOPHEN 650 MG RE SUPP: 650.0000 mg | Freq: Four times a day (QID) | RECTAL | Status: DC | PRN

## 2015-04-13 MED ORDER — ONDANSETRON HCL 4 MG PO TABS: 4.0000 mg | ORAL_TABLET | Freq: Four times a day (QID) | ORAL | Status: DC | PRN

## 2015-04-13 MED ORDER — METFORMIN HCL 500 MG PO TABS
1000.0000 mg | ORAL_TABLET | Freq: Two times a day (BID) | ORAL | Status: DC
  Administered 2015-04-13: 1000 mg via ORAL
  Filled 2015-04-13: qty 2

## 2015-04-13 MED ORDER — ONDANSETRON HCL 4 MG/2ML IJ SOLN
4.0000 mg | Freq: Four times a day (QID) | INTRAMUSCULAR | Status: DC | PRN
  Administered 2015-04-14: 4 mg via INTRAVENOUS
  Filled 2015-04-13: qty 2

## 2015-04-13 MED ORDER — VANCOMYCIN HCL IN DEXTROSE 750-5 MG/150ML-% IV SOLN
750.0000 mg | Freq: Two times a day (BID) | INTRAVENOUS | Status: DC
Start: 2015-04-13 — End: 2015-04-13
  Administered 2015-04-13: 750 mg via INTRAVENOUS
  Filled 2015-04-13 (×2): qty 150

## 2015-04-13 MED ORDER — VANCOMYCIN HCL IN DEXTROSE 1-5 GM/200ML-% IV SOLN
1000.0000 mg | Freq: Two times a day (BID) | INTRAVENOUS | Status: DC
  Administered 2015-04-13: 1000 mg via INTRAVENOUS
  Filled 2015-04-13: qty 200

## 2015-04-13 MED ORDER — LOSARTAN POTASSIUM 50 MG PO TABS
50.0000 mg | ORAL_TABLET | Freq: Every morning | ORAL | Status: DC
  Administered 2015-04-13: 50 mg via ORAL
  Filled 2015-04-13: qty 1

## 2015-04-13 MED ORDER — DEXTROSE 5 % IV SOLN
2.0000 g | Freq: Every day | INTRAVENOUS | Status: DC
  Administered 2015-04-13: 2 g via INTRAVENOUS
  Filled 2015-04-13 (×2): qty 2

## 2015-04-13 MED ORDER — ADULT MULTIVITAMIN W/MINERALS CH
1.0000 | ORAL_TABLET | Freq: Every day | ORAL | Status: DC
  Administered 2015-04-13 – 2015-04-16 (×3): 1 via ORAL
  Filled 2015-04-13 (×3): qty 1

## 2015-04-13 MED ORDER — ASPIRIN 81 MG PO CHEW
81.0000 mg | CHEWABLE_TABLET | Freq: Every day | ORAL | Status: DC
  Administered 2015-04-13 – 2015-04-16 (×3): 81 mg via ORAL
  Filled 2015-04-13 (×3): qty 1

## 2015-04-13 MED ORDER — FINASTERIDE 5 MG PO TABS
5.0000 mg | ORAL_TABLET | Freq: Every day | ORAL | Status: DC
  Administered 2015-04-13 – 2015-04-16 (×3): 5 mg via ORAL
  Filled 2015-04-13 (×3): qty 1

## 2015-04-13 MED ORDER — SODIUM CHLORIDE 0.9 % IV SOLN
500.0000 mg | Freq: Three times a day (TID) | INTRAVENOUS | Status: DC
  Administered 2015-04-13 – 2015-04-16 (×9): 500 mg via INTRAVENOUS
  Filled 2015-04-13 (×11): qty 500

## 2015-04-13 MED ORDER — VITAMIN B-1 100 MG PO TABS
100.0000 mg | ORAL_TABLET | Freq: Every day | ORAL | Status: DC
  Administered 2015-04-13 – 2015-04-16 (×3): 100 mg via ORAL
  Filled 2015-04-13 (×3): qty 1

## 2015-04-13 MED ORDER — SODIUM CHLORIDE 0.9 % IV BOLUS (SEPSIS)
500.0000 mL | Freq: Once | INTRAVENOUS | Status: AC
  Administered 2015-04-13: 500 mL via INTRAVENOUS

## 2015-04-13 MED ORDER — GABAPENTIN 400 MG PO CAPS
400.0000 mg | ORAL_CAPSULE | Freq: Four times a day (QID) | ORAL | Status: DC
  Administered 2015-04-13 – 2015-04-16 (×9): 400 mg via ORAL
  Filled 2015-04-13 (×10): qty 1

## 2015-04-13 MED ORDER — SODIUM CHLORIDE 0.9 % IV BOLUS (SEPSIS)
1000.0000 mL | Freq: Once | INTRAVENOUS | Status: AC
  Administered 2015-04-13: 1000 mL via INTRAVENOUS

## 2015-04-13 MED ORDER — CYCLOBENZAPRINE HCL 10 MG PO TABS: 10.0000 mg | ORAL_TABLET | Freq: Three times a day (TID) | ORAL | Status: DC | PRN

## 2015-04-13 MED ORDER — OXYCODONE HCL 5 MG PO TABS
5.0000 mg | ORAL_TABLET | ORAL | Status: DC | PRN
  Administered 2015-04-13 – 2015-04-16 (×10): 5 mg via ORAL
  Filled 2015-04-13 (×10): qty 1

## 2015-04-13 MED ORDER — SODIUM CHLORIDE 0.9 % IV SOLN
INTRAVENOUS | Status: AC
  Filled 2015-04-13: qty 3

## 2015-04-13 MED ORDER — DEXTROSE 5 % IV SOLN
500.0000 mg | Freq: Every day | INTRAVENOUS | Status: DC
  Administered 2015-04-13: 500 mg via INTRAVENOUS
  Filled 2015-04-13: qty 500

## 2015-04-13 MED ORDER — DEXTROSE 50 % IV SOLN
INTRAVENOUS | Status: AC
  Administered 2015-04-13: 18:00:00
  Filled 2015-04-13: qty 50

## 2015-04-13 MED ORDER — CYANOCOBALAMIN 1000 MCG/ML IJ SOLN: 1000.0000 ug | INTRAMUSCULAR | Status: DC

## 2015-04-13 NOTE — Progress Notes (Signed)
ANTIBIOTIC CONSULT NOTE - INITIAL  Pharmacy Consult for Vancomycin, antibiotic renal dose adjustment  Indication: pneumonia  Allergies  Allergen Reactions  . Penicillins Hives    Has patient had a PCN reaction causing immediate rash, facial/tongue/throat swelling, SOB or lightheadedness with hypotension: No Has patient had a PCN reaction causing severe rash involving mucus membranes or skin necrosis: No Has patient had a PCN reaction that required hospitalization No Has patient had a PCN reaction occurring within the last 10 years:  If all of the above answers are "NO", then may proceed with Cephalosporin use.     Patient Measurements: Height: 5\' 10"  (177.8 cm) Weight: 173 lb 4.5 oz (78.6 kg) IBW/kg (Calculated) : 73  Vital Signs: Temp: 98.2 F (36.8 C) (09/28 0527) Temp Source: Oral (09/28 0527) BP: 148/49 mmHg (09/28 0527) Pulse Rate: 95 (09/28 0527) Intake/Output from previous day: 09/27 0701 - 09/28 0700 In: 570 [P.O.:120; IV Piggyback:450] Out: 800 [Urine:800]  Labs:  Recent Labs  04/12/15 2222 04/13/15 0137 04/13/15 0423  WBC 4.4 6.8 9.4  HGB 10.4* 9.3* 9.1*  PLT 182 167 158  CREATININE 1.00 0.93 0.81   Estimated Creatinine Clearance: 67.6 mL/min (by C-G formula based on Cr of 0.81). No results for input(s): VANCOTROUGH, VANCOPEAK, VANCORANDOM, GENTTROUGH, GENTPEAK, GENTRANDOM, TOBRATROUGH, TOBRAPEAK, TOBRARND, AMIKACINPEAK, AMIKACINTROU, AMIKACIN in the last 72 hours.   Medical History: Past Medical History  Diagnosis Date  . Anemia   . Diabetes mellitus   . Hyperlipidemia   . Renal insufficiency   . Paget's disease     scrotum  . Positive PPD, treated   . Hypertension     EKG, chest x ray 11/15/10 EPIC,  clearance Dr Sharlene Motts on chart/ pt states on meds to "prevent hypertension from diabetes"  . Carpal tunnel syndrome     left leg sciatica  . Easy bruising   . Stroke     SPINAL CORD STROKE PER OV note 3/13  DR LOVE CLEARANCE NOTE_  ON CHART WITH  RECOMMENDATIONS-   has weakness  upper extremities  . Arthritis     hips  . Hearing loss     left ear greater loss  . Cancer     skin can lesions, and non cancer skin lesions-follwed by Dr. Nevada Crane  . History of shingles     past hx. left hand    Assessment: 68 yoM presented to ED on 9/27 with shaking tremors.  PMH includes DMT2, HTN, HLD, CKD, stroke, and anemia.  CXR in ED shows opacity of RLL suspicious for pneumonia.  MD has started ceftriaxone and azithromycin, Pharmacy is consulted to dose vancomycin.  Today, 04/13/2015:  Lactic Acid: 4 > 2.6   PCT: 7.63  Tm 100.8  WC WNL  SCr 0.81 with CrCl ~ 68  Goal of Therapy:  Vancomycin trough level 15-20 mcg/ml  Plan:   Continue Azithromycin 500mg  IV q24h  Continue Ceftriaxone 1g IV q24h  Increase to Vancomycin 1g IV q12h.  Measure Vanc trough at steady state.  Follow up renal fxn, culture results, and clinical course.  Gretta Arab PharmD, BCPS Pager [phone] 04/13/2015 8:36 AM

## 2015-04-13 NOTE — Progress Notes (Signed)
[facility] PROGRESS NOTE  Andrew Norton GYK:[id number] DOB: [birth year] DOA: 04/12/2015 PCP: Laurey Morale, MD Interim summary: Andrew Norton is a 81 y.o. male with history of DM Type II HTN HLD CKD Anemia presents with weakness and trembling,. In the ED he had a CXR which does show pneumonia with an elevated lactate.   Assessment/Plan: 1. Sepsis from community acquired pneumonia vs aspiration pneumonia: - started on IV antibiotics and IV fluids.  - changed antibiotics to primaxin for aspiration pneumonitis.  - blood cultures ordered and negative so far.  Urine fore strep antigen negative. Legionella antigen pending.  - follow lactic acid.  - fluid boluses as needed.    Hypertension: Controlled.   Hyperlipidemia: Holding statins for elevated liver function tests.   Diabetes mellitus: Holding metformin.  hgba1c ordered.  Resume SSI.   Anemia: Normocytic , monitor. Get anemia panel.   Elevated lactic acid : Resolved  Fluid boluses to keep map>65.   Code Status: full code Family Communication: wife at bedside.  Disposition Plan: pending PT eval.    Consultants:  Speech and swallow eval   Procedures:  none  Antibiotics:  Vancomycin 9/28  Rocephin  zithromax  HPI/Subjective: Reports feeling good. No shaking or trembling.  Objective: Filed Vitals:   04/13/15 0527  BP: 148/49  Pulse: 95  Temp: 98.2 F (36.8 C)  Resp: 20    Intake/Output Summary (Last 24 hours) at 04/13/15 1405 Last data filed at 04/13/15 0900  Gross per 24 hour  Intake    930 ml  Output    800 ml  Net    130 ml   Filed Weights   04/13/15 0043  Weight: 78.6 kg (173 lb 4.5 oz)    Exam:   General:  Alert afebrile comfortable  Cardiovascular: s1s2  Respiratory: clear to auscultation bilaterally,   Abdomen: soft NT ND BS+  Musculoskeletal: no pedal edema.  Data Reviewed: Basic Metabolic Panel:  Recent Labs Lab 04/12/15 2222 04/13/15 0137 04/13/15 0423  NA 135  135 135  K 4.3 3.5 3.8  CL 105 107 106  CO2 22 22 22   GLUCOSE 257* 164* 177*  BUN 22* 22* 20  CREATININE 1.00 0.93 0.81  CALCIUM 8.3* 8.0* 7.5*   Liver Function Tests:  Recent Labs Lab 04/13/15 0137 04/13/15 0423  AST 1146* 820*  ALT 653* 597*  ALKPHOS 191* 164*  BILITOT 1.4* 1.7*  PROT 6.4* 5.7*  ALBUMIN 3.5 3.2*   No results for input(s): LIPASE, AMYLASE in the last 168 hours. No results for input(s): AMMONIA in the last 168 hours. CBC:  Recent Labs Lab 04/12/15 2222 04/13/15 0137 04/13/15 0423  WBC 4.4 6.8 9.4  NEUTROABS 4.1 6.2  --   HGB 10.4* 9.3* 9.1*  HCT 31.3* 28.1* 27.1*  MCV 92.9 93.4 91.9  PLT 182 167 158   Cardiac Enzymes: No results for input(s): CKTOTAL, CKMB, CKMBINDEX, TROPONINI in the last 168 hours. BNP (last 3 results) No results for input(s): BNP in the last 8760 hours.  ProBNP (last 3 results) No results for input(s): PROBNP in the last 8760 hours.  CBG:  Recent Labs Lab 04/13/15 0109 04/13/15 0728 04/13/15 1219  GLUCAP 172* 163* 128*    Recent Results (from the past 240 hour(s))  Culture, blood (x 2)     Status: None (Preliminary result)   Collection Time: 04/13/15  1:37 AM  Result Value Ref Range Status   Specimen Description BLOOD LEFT ANTECUBITAL  Final   Special Requests  Final    BOTTLES DRAWN AEROBIC AND ANAEROBIC [place] BLUE 6CC RED Performed at [facility]    Culture PENDING  Incomplete   Report Status PENDING  Incomplete  Culture, blood (x 2)     Status: None (Preliminary result)   Collection Time: 04/13/15  1:45 AM  Result Value Ref Range Status   Specimen Description BLOOD LEFT HAND  Final   Special Requests   Final    IN PEDIATRIC BOTTLE 3CC Performed at [facility]    Culture PENDING  Incomplete   Report Status PENDING  Incomplete     Studies: Dg Chest 2 View  04/12/2015   CLINICAL DATA:  The patient was working around the house and started feeling very shaky today.  EXAM: CHEST  2 VIEW   COMPARISON:  October 23, 2012  FINDINGS: The heart size and mediastinal contours are within normal limits. There is patchy opacity of right lung base. There is no pulmonary edema or pleural effusion. The visualized skeletal structures are stable.  IMPRESSION: Patchy opacity of the right lung base suspicious for developing pneumonia.   Electronically Signed   By: Abelardo Diesel M.D.   On: 04/12/2015 22:15   Ct Head Wo Contrast  04/13/2015   CLINICAL DATA:  Syncopal episodes  EXAM: CT HEAD WITHOUT CONTRAST  TECHNIQUE: Contiguous axial images were obtained from the base of the skull through the vertex without intravenous contrast.  COMPARISON:  01/13/2008  FINDINGS: Skull and Sinuses:Negative for fracture or destructive process. The mastoids, middle ears, and imaged paranasal sinuses are clear.  Orbits: Bilateral cataract resection.  No acute finding.  Brain: No evidence of acute infarction, hemorrhage, hydrocephalus, or mass lesion/mass effect.  Cortical atrophy which is especially prominent at the vertex, pattern similar to 2009.  Expected small-vessel ischemic changes in the deep cerebral white matter.  IMPRESSION: 1. No acute finding. 2. Cortical atrophy with posterior frontal and parietal predilection, pattern similar to 2009.   Electronically Signed   By: Monte Fantasia M.D.   On: 04/13/2015 00:24   Dg Chest Port 1 View  04/13/2015   CLINICAL DATA:  Weakness.  Elevated lactic acid.  EXAM: PORTABLE CHEST 1 VIEW  COMPARISON:  PA and lateral chest 04/12/2015.  FINDINGS: There is new mild interstitial pulmonary edema. Patchy bibasilar airspace opacities are again seen. Heart size is normal. No pneumothorax. There are likely trace pleural effusions.  IMPRESSION: Mild interstitial edema appears new since yesterday's examination.  No change in patchy bibasilar airspace opacities which could be atelectasis or pneumonia.  Trace pleural effusions.   Electronically Signed   By: Inge Rise M.D.   On: 04/13/2015  07:53    Scheduled Meds: . aspirin  81 mg Oral Daily  . azithromycin  500 mg Intravenous QHS  . cefTRIAXone (ROCEPHIN)  IV  2 g Intravenous QHS  . [START ON 04/25/2015] cyanocobalamin  1,000 mcg Intramuscular Q30 days  . finasteride  5 mg Oral Daily  . folic acid  1 mg Oral Daily  . gabapentin  400 mg Oral QID  . heparin  5,000 Units Subcutaneous 3 times per day  . insulin aspart  0-15 Units Subcutaneous TID WC  . losartan  50 mg Oral q morning - 10a  . multivitamin with minerals  1 tablet Oral Daily  . simvastatin  40 mg Oral QPM  . thiamine  100 mg Oral Daily  . vancomycin  1,000 mg Intravenous BID   Continuous Infusions: . sodium chloride 50  mL/hr at 04/13/15 5929    Active Problems:   Diabetes mellitus without complication   Essential hypertension   Aspiration pneumonia   Sepsis   Hyperlipidemia   Lobar pneumonia due to unspecified organism    Time spent: 25 minutes    [facility] Pager [phone]. If 7PM-7AM, please contact night-coverage at [email or web address], password [email or web address] 04/13/2015, 2:05 PM  LOS: 1 day

## 2015-04-13 NOTE — Progress Notes (Signed)
CRITICAL VALUE ALERT  Critical value received:  Lactic acid 4.0   Date of notification:  04/13/2015  Time of notification:  9191  Critical value read back:Yes.    Nurse who received alert:  J.Rimando  MD notified (1st page): K.schoor  Time of first page: 0240  MD notified (2nd page):  Time of second page:  Responding MD: K.Schoor  Time MD responded: [phone]

## 2015-04-13 NOTE — ED Notes (Signed)
Admitting MD at bedside.

## 2015-04-13 NOTE — Care Management Note (Signed)
Case Management Note  Patient Details  Name: Andrew Norton MRN: [id number] Date of Birth: [birth year]  Subjective/Objective:  81y/o m admitted w/PNA. From home. Would recommend PT cons if difficulty w/ambulation.                  Action/Plan:d/c plan home.   Expected Discharge Date:                  Expected Discharge Plan:  [place]  In-House Referral:     Discharge planning Services  CM Consult  Post Acute Care Choice:    Choice offered to:     DME Arranged:    DME Agency:     HH Arranged:    [place] Agency:     Status of Service:  In process, will continue to follow  Medicare Important Message Given:    Date Medicare IM Given:    Medicare IM give by:    Date Additional Medicare IM Given:    Additional Medicare Important Message give by:     If discussed at [place] of Stay Meetings, dates discussed:    Additional Comments:  Dessa Phi, RN 04/13/2015, 10:02 AM

## 2015-04-13 NOTE — Progress Notes (Signed)
CRITICAL VALUE ALERT  Critical value received:  Lactic acid 2.6  Date of notification:  04/13/2015  Time of notification:  3536  Critical value read back:Yes.    Nurse who received alert:  J.Rimando  MD notified (1st page):  K.Schoor  Time of first page:  0542  MD notified (2nd page):  Time of second page:  Responding MD:  K.Schoor  Time MD responded:  [phone]

## 2015-04-13 NOTE — Progress Notes (Signed)
Inpatient Diabetes Program Recommendations  AACE/ADA: New Consensus Statement on Inpatient Glycemic Control (2015)  Target Ranges:  Prepandial:   less than 140 mg/dL      Peak postprandial:   less than 180 mg/dL (1-2 hours)      Critically ill patients:  140 - 180 mg/dL    Admit with: Pneumonia/ Sepsis  History: DM  Home DM Meds: Metformin 1000 mg bid       Glipizide 10 mg bid  Current DM Orders:Metformin 1000 mg bid           Glipizide 10 mg bid            Novolog Moderate SSI (0-15 units) TID AC     MD- Please consider discontinuation of Metformin and Glipizide for now (especially in the setting of patient's elevated lactic acid level).  Please manage with insulin while patient hospitalized.     ----Will follow patient during hospitalization----  Wyn Quaker RN, MSN, CDE Diabetes Coordinator Inpatient Glycemic Control Team Team Pager: [phone] (8a-5p)

## 2015-04-14 ENCOUNTER — Inpatient Hospital Stay (HOSPITAL_COMMUNITY): Payer: Medicare Other

## 2015-04-14 DIAGNOSIS — I10 Essential (primary) hypertension: Secondary | ICD-10-CM

## 2015-04-14 LAB — CBC
HEMATOCRIT: 26.2 % — AB (ref 39.0–52.0)
Hemoglobin: 8.9 g/dL — ABNORMAL LOW (ref 13.0–17.0)
MCH: 30.5 pg (ref 26.0–34.0)
MCHC: 34 g/dL (ref 30.0–36.0)
MCV: 89.7 fL (ref 78.0–100.0)
PLATELETS: 152 10*3/uL (ref 150–400)
RBC: 2.92 MIL/uL — AB (ref 4.22–5.81)
RDW: 13.4 % (ref 11.5–15.5)
WBC: 10.9 10*3/uL — AB (ref 4.0–10.5)

## 2015-04-14 LAB — COMPREHENSIVE METABOLIC PANEL
ALBUMIN: 3.2 g/dL — AB (ref 3.5–5.0)
ALT: 377 U/L — AB (ref 17–63)
AST: 254 U/L — ABNORMAL HIGH (ref 15–41)
Alkaline Phosphatase: 149 U/L — ABNORMAL HIGH (ref 38–126)
Anion gap: 7 (ref 5–15)
BUN: 15 mg/dL (ref 6–20)
CHLORIDE: 105 mmol/L (ref 101–111)
CO2: 22 mmol/L (ref 22–32)
Calcium: 7.8 mg/dL — ABNORMAL LOW (ref 8.9–10.3)
Creatinine, Ser: 0.75 mg/dL (ref 0.61–1.24)
GFR calc Af Amer: >60 mL/min (ref >60)
GFR calc non Af Amer: >60 mL/min (ref >60)
GLUCOSE: 65 mg/dL (ref 65–99)
POTASSIUM: 4 mmol/L (ref 3.5–5.1)
Sodium: 134 mmol/L — ABNORMAL LOW (ref 135–145)
Total Bilirubin: 2.9 mg/dL — ABNORMAL HIGH (ref 0.3–1.2)
Total Protein: 5.8 g/dL — ABNORMAL LOW (ref 6.5–8.1)

## 2015-04-14 LAB — GLUCOSE, CAPILLARY
GLUCOSE-CAPILLARY: 168 mg/dL — AB (ref 65–99)
Glucose-Capillary: 63 mg/dL — ABNORMAL LOW (ref 65–99)
Glucose-Capillary: 104 mg/dL — ABNORMAL HIGH (ref 65–99)
Glucose-Capillary: 151 mg/dL — ABNORMAL HIGH (ref 65–99)
Glucose-Capillary: 145 mg/dL — ABNORMAL HIGH (ref 65–99)

## 2015-04-14 LAB — LEGIONELLA PNEUMOPHILA SEROGP 1 UR AG: L. pneumophila Serogp 1 Ur Ag: NEGATIVE

## 2015-04-14 LAB — HEMOGLOBIN A1C
Hgb A1c MFr Bld: 8.1 % — ABNORMAL HIGH (ref 4.8–5.6)
MEAN PLASMA GLUCOSE: 186 mg/dL

## 2015-04-14 NOTE — Evaluation (Signed)
Physical Therapy Evaluation Patient Details Name: Andrew Norton MRN: [id number] DOB: [birth year] Today's Date: 04/14/2015   History of Present Illness  81 y.o. male with h/o DM, HTN, CKD, anemia, L THA, spinal cord stroke admitted with aspiration PNA, sepsis.   Clinical Impression  Pt admitted with above diagnosis. Pt currently with functional limitations due to the deficits listed below (see PT Problem List). Pt ambulated 400' with RW, no loss of balance. SaO2 93% on RA, HR 144 with walking (RN notified). Pt is expected to return to baseline of independently walking using a cane.  Pt will benefit from skilled PT to increase their independence and safety with mobility to allow discharge to the venue listed below.       Follow Up Recommendations No PT follow up    Equipment Recommendations  None recommended by PT    Recommendations for Other Services       Precautions / Restrictions Precautions Precautions: Fall Precaution Comments: monitor HR Restrictions Weight Bearing Restrictions: No      Mobility  Bed Mobility Overal bed mobility: Modified Independent             General bed mobility comments: used rail  Transfers Overall transfer level: Modified independent Equipment used: Rolling walker (2 wheeled) Transfers: Sit to/from Stand Sit to Stand: Modified independent (Device/Increase time)         General transfer comment: good hand placement  Ambulation/Gait Ambulation/Gait assistance: Supervision Ambulation Distance (Feet): 400 Feet Assistive device: Rolling walker (2 wheeled) Gait Pattern/deviations: WFL(Within Functional Limits)   Gait velocity interpretation: at or above normal speed for age/gender General Gait Details: verbal cues to correct forward head posture, pt able to partially correct, steady with walking, no LOB, HR up to 144, SaO2 93% on RA, 2/4 dyspnea  Stairs            Wheelchair Mobility    Modified Rankin (Stroke Patients Only)        Balance Overall balance assessment: Modified Independent                                           Pertinent Vitals/Pain Pain Assessment: 0-10 Pain Location: chronic pain L hemibody  Pain Intervention(s): Monitored during session;Limited activity within patient's tolerance    Home Living Family/patient expects to be discharged to:: Private residence Living Arrangements: Spouse/significant other Available Help at Discharge: Family;Available 24 hours/day Type of Home: House Home Access: Stairs to enter Entrance Stairs-Rails: Psychiatric nurse of Steps: 4 Home Layout: Two level;Bed/bath upstairs Home Equipment: Cane - single point;Walker - 2 wheels;Shower seat      Prior Function Level of Independence: Independent with assistive device(s)         Comments: walked with SPC     Hand Dominance        Extremity/Trunk Assessment   Upper Extremity Assessment: Overall WFL for tasks assessed           Lower Extremity Assessment: Overall WFL for tasks assessed      Cervical / Trunk Assessment: Kyphotic (forward head)  Communication   Communication: No difficulties  Cognition Arousal/Alertness: Awake/alert Behavior During Therapy: WFL for tasks assessed/performed Overall Cognitive Status: Within Functional Limits for tasks assessed                      General Comments      Exercises  Assessment/Plan    PT Assessment Patient needs continued PT services  PT Diagnosis Generalized weakness   PT Problem List Cardiopulmonary status limiting activity  PT Treatment Interventions Gait training;Therapeutic exercise   PT Goals (Current goals can be found in the Care Plan section) Acute Rehab PT Goals Patient Stated Goal: to work in the yard PT Goal Formulation: With patient/family Time For Goal Achievement: 04/28/15 Potential to Achieve Goals: Good    Frequency Min 3X/week   Barriers to discharge         Co-evaluation               End of Session Equipment Utilized During Treatment: Gait belt Activity Tolerance: Patient limited by fatigue Patient left: in chair;with chair alarm set;with call bell/phone within reach;with family/visitor present Nurse Communication: Mobility status;Other (comment) (elevated HR with walking)         Time: 5456-2563 PT Time Calculation (min) (ACUTE ONLY): 27 min   Charges:   PT Evaluation $Initial PT Evaluation Tier I: 1 Procedure PT Treatments $Gait Training: 8-22 mins   PT G Codes:        Philomena Doheny 04/14/2015, 2:10 PM [phone]

## 2015-04-14 NOTE — Progress Notes (Signed)
[facility] PROGRESS NOTE  [place] Andrew Norton:[id number] DOB: [birth year] DOA: 04/12/2015 PCP: Laurey Morale, MD Interim summary: Andrew Norton is a 81 y.o. male with history of DM Type II HTN HLD CKD Anemia presents with weakness and trembling,. In the ED he had a CXR which does show pneumonia with an elevated lactate.   Assessment/Plan: 1. Sepsis from community acquired pneumonia vs aspiration pneumonia: - started on IV antibiotics and IV fluids.  - changed antibiotics to primaxin for aspiration pneumonitis.  - blood cultures ordered and negative so far.  Urine fore strep antigen negative. Legionella antigen pending. Lactic acid normalized.  - fluid boluses as needed.    Hypertension: Controlled.   Hyperlipidemia: Holding statins for elevated liver function tests.   Diabetes mellitus: Holding metformin.  hgba1c ordered.  Resume SSI.   Anemia: Normocytic , monitor. Get anemia panel.   Elevated lactic acid : Resolved  Fluid boluses to keep map>65.   Elevated liver function tests: He denies any abdominal pain. Appear to be improving. Holding the statins. Will get CK level too.    An episode of syncope after choking on pills one week ago, has a h/o dysphagia after neck surgery int he past.  SLP eval    Code Status: full code Family Communication: wife at bedside.  Disposition Plan: pending PT eval.    Consultants:  Speech and swallow eval   Procedures:  none  Antibiotics:  Vancomycin 9/28  Rocephin  zithromax  HPI/Subjective: Reports feeling good. No shaking or trembling.  Feels tired.  Objective: Filed Vitals:   04/14/15 0433  BP: 136/57  Pulse: 76  Temp: 98.4 F (36.9 C)  Resp: 18    Intake/Output Summary (Last 24 hours) at 04/14/15 1317 Last data filed at 04/14/15 1300  Gross per 24 hour  Intake 2622.5 ml  Output   2675 ml  Net  -52.5 ml   Filed Weights   04/13/15 0043  Weight: 78.6 kg (173 lb 4.5 oz)    Exam:   General:   Alert afebrile comfortable  Cardiovascular: s1s2  Respiratory: clear to auscultation bilaterally,   Abdomen: soft NT ND BS+  Musculoskeletal: no pedal edema.  Data Reviewed: Basic Metabolic Panel:  Recent Labs Lab 04/12/15 2222 04/13/15 0137 04/13/15 0423 04/14/15 0429  NA 135 135 135 134*  K 4.3 3.5 3.8 4.0  CL 105 107 106 105  CO2 22 22 22 22   GLUCOSE 257* 164* 177* 65  BUN 22* 22* 20 15  CREATININE 1.00 0.93 0.81 0.75  CALCIUM 8.3* 8.0* 7.5* 7.8*   Liver Function Tests:  Recent Labs Lab 04/13/15 0137 04/13/15 0423 04/14/15 0429  AST 1146* 820* 254*  ALT 653* 597* 377*  ALKPHOS 191* 164* 149*  BILITOT 1.4* 1.7* 2.9*  PROT 6.4* 5.7* 5.8*  ALBUMIN 3.5 3.2* 3.2*   No results for input(s): LIPASE, AMYLASE in the last 168 hours. No results for input(s): AMMONIA in the last 168 hours. CBC:  Recent Labs Lab 04/12/15 2222 04/13/15 0137 04/13/15 0423 04/14/15 0429  WBC 4.4 6.8 9.4 10.9*  NEUTROABS 4.1 6.2  --   --   HGB 10.4* 9.3* 9.1* 8.9*  HCT 31.3* 28.1* 27.1* 26.2*  MCV 92.9 93.4 91.9 89.7  PLT 182 167 158 152   Cardiac Enzymes: No results for input(s): CKTOTAL, CKMB, CKMBINDEX, TROPONINI in the last 168 hours. BNP (last 3 results) No results for input(s): BNP in the last 8760 hours.  ProBNP (last 3 results) No results for  input(s): PROBNP in the last 8760 hours.  CBG:  Recent Labs Lab 04/13/15 1832 04/13/15 2030 04/14/15 0720 04/14/15 0754 04/14/15 1154  GLUCAP 116* 109* 63* 104* 151*    Recent Results (from the past 240 hour(s))  Culture, blood (x 2)     Status: None (Preliminary result)   Collection Time: 04/13/15  1:37 AM  Result Value Ref Range Status   Specimen Description BLOOD LEFT ANTECUBITAL  Final   Special Requests   Final    BOTTLES DRAWN AEROBIC AND ANAEROBIC [place] RED Performed at [facility]    Culture PENDING  Incomplete   Report Status PENDING  Incomplete  Culture, blood (x 2)     Status: None  (Preliminary result)   Collection Time: 04/13/15  1:45 AM  Result Value Ref Range Status   Specimen Description BLOOD LEFT HAND  Final   Special Requests   Final    IN PEDIATRIC BOTTLE 3CC Performed at [facility]    Culture PENDING  Incomplete   Report Status PENDING  Incomplete     Studies: Dg Chest 2 View  04/12/2015   CLINICAL DATA:  The patient was working around the house and started feeling very shaky today.  EXAM: CHEST  2 VIEW  COMPARISON:  October 23, 2012  FINDINGS: The heart size and mediastinal contours are within normal limits. There is patchy opacity of right lung base. There is no pulmonary edema or pleural effusion. The visualized skeletal structures are stable.  IMPRESSION: Patchy opacity of the right lung base suspicious for developing pneumonia.   Electronically Signed   By: Abelardo Diesel M.D.   On: 04/12/2015 22:15   Ct Head Wo Contrast  04/13/2015   CLINICAL DATA:  Syncopal episodes  EXAM: CT HEAD WITHOUT CONTRAST  TECHNIQUE: Contiguous axial images were obtained from the base of the skull through the vertex without intravenous contrast.  COMPARISON:  01/13/2008  FINDINGS: Skull and Sinuses:Negative for fracture or destructive process. The mastoids, middle ears, and imaged paranasal sinuses are clear.  Orbits: Bilateral cataract resection.  No acute finding.  Brain: No evidence of acute infarction, hemorrhage, hydrocephalus, or mass lesion/mass effect.  Cortical atrophy which is especially prominent at the vertex, pattern similar to 2009.  Expected small-vessel ischemic changes in the deep cerebral white matter.  IMPRESSION: 1. No acute finding. 2. Cortical atrophy with posterior frontal and parietal predilection, pattern similar to 2009.   Electronically Signed   By: Monte Fantasia M.D.   On: 04/13/2015 00:24   Dg Chest Port 1 View  04/13/2015   CLINICAL DATA:  Weakness.  Elevated lactic acid.  EXAM: PORTABLE CHEST 1 VIEW  COMPARISON:  PA and lateral chest  04/12/2015.  FINDINGS: There is new mild interstitial pulmonary edema. Patchy bibasilar airspace opacities are again seen. Heart size is normal. No pneumothorax. There are likely trace pleural effusions.  IMPRESSION: Mild interstitial edema appears new since yesterday's examination.  No change in patchy bibasilar airspace opacities which could be atelectasis or pneumonia.  Trace pleural effusions.   Electronically Signed   By: Inge Rise M.D.   On: 04/13/2015 07:53    Scheduled Meds: . aspirin  81 mg Oral Daily  . [START ON 04/25/2015] cyanocobalamin  1,000 mcg Intramuscular Q30 days  . finasteride  5 mg Oral Daily  . folic acid  1 mg Oral Daily  . gabapentin  400 mg Oral QID  . heparin  5,000 Units Subcutaneous 3 times per day  .  imipenem-cilastatin  500 mg Intravenous Q8H  . insulin aspart  0-15 Units Subcutaneous TID WC  . multivitamin with minerals  1 tablet Oral Daily  . thiamine  100 mg Oral Daily   Continuous Infusions: . sodium chloride 50 mL/hr at 04/14/15 0509    Active Problems:   Diabetes mellitus without complication   Essential hypertension   Aspiration pneumonia   Sepsis   Hyperlipidemia   Lobar pneumonia due to unspecified organism    Time spent: 25 minutes    [facility] Pager [phone]. If 7PM-7AM, please contact night-coverage at [email or web address], password [facility] 04/14/2015, 1:17 PM  LOS: 2 days

## 2015-04-14 NOTE — Progress Notes (Signed)
SLP Cancellation Note  Patient Details Name: Andrew Norton MRN: [id number] DOB: May 20, 1927   Cancelled treatment:        Per RN, pt NPO for ultarsound at 1430 today. ST will be able to assess swallow function next date.    [name] 04/14/2015, 10:46 AM   [name].Ed [employer] [phone]

## 2015-04-14 NOTE — Progress Notes (Signed)
Inpatient Diabetes Program Recommendations  AACE/ADA: New Consensus Statement on Inpatient Glycemic Control (2015)  Target Ranges:  Prepandial:   less than 140 mg/dL      Peak postprandial:   less than 180 mg/dL (1-2 hours)      Critically ill patients:  140 - 180 mg/dL    Results for BREYON, SIGG (MRN [id number]) as of 04/14/2015 10:09  Ref. Range 04/13/2015 07:28 04/13/2015 12:19 04/13/2015 17:07 04/13/2015 17:39 04/13/2015 18:32 04/13/2015 20:30  Glucose-Capillary Latest Ref Range: 65-99 mg/dL 163 (H) 128 (H) 66 56 (L) 116 (H) 109 (H)    Results for OSHEA, PERCIVAL (MRN [id number]) as of 04/14/2015 10:09  Ref. Range 04/14/2015 07:20 04/14/2015 07:54  Glucose-Capillary Latest Ref Range: 65-99 mg/dL 63 (L) 104 (H)    Admit with: Pneumonia/ Sepsis  History: DM  Home DM Meds: Metformin 1000 mg bid  Glipizide 10 mg bid  Current DM Orders: Novolog Moderate SSI (0-15 units) TID AC     MD- Note patient had Hypoglycemia yesterday evening at 5pm and again this morning at 7am.  Please consider decreasing Novolog SSI to Sensitive scale (0-9 units) TID AC + HS (currently ordered as Moderate scale)     ----Will follow patient during hospitalization----  Wyn Quaker RN, MSN, CDE Diabetes Coordinator Inpatient Glycemic Control Team Team Pager: [phone] (8a-5p)

## 2015-04-15 LAB — GLUCOSE, CAPILLARY
GLUCOSE-CAPILLARY: 174 mg/dL — AB (ref 65–99)
GLUCOSE-CAPILLARY: 189 mg/dL — AB (ref 65–99)
Glucose-Capillary: 143 mg/dL — ABNORMAL HIGH (ref 65–99)
Glucose-Capillary: 243 mg/dL — ABNORMAL HIGH (ref 65–99)

## 2015-04-15 NOTE — Evaluation (Signed)
Clinical/Bedside Swallow Evaluation Patient Details  Name: Andrew Norton MRN: [id number] Date of Birth: 1926-12-26  Today's Date: 04/15/2015 Time: SLP Start Time (ACUTE ONLY): 0827 SLP Stop Time (ACUTE ONLY): 0842 SLP Time Calculation (min) (ACUTE ONLY): 15 min  Past Medical History:  Past Medical History  Diagnosis Date  . Anemia   . Diabetes mellitus   . Hyperlipidemia   . Renal insufficiency   . Paget's disease     scrotum  . Positive PPD, treated   . Hypertension     EKG, chest x ray 11/15/10 EP[facility],  clearance Dr Sharlene Motts on chart/ pt states on meds to "prevent hypertension from diabetes"  . Carpal tunnel syndrome     left leg sciatica  . Easy bruising   . Stroke     SPINAL CORD STROKE PER OV note 3/13  DR LOVE CLEARANCE NOTE_  ON CHART WITH RECOMMENDATIONS-   has weakness  upper extremities  . Arthritis     hips  . Hearing loss     left ear greater loss  . Cancer     skin can lesions, and non cancer skin lesions-follwed by Dr. Nevada Crane  . History of shingles     past hx. left hand   Past Surgical History:  Past Surgical History  Procedure Laterality Date  . Tonsillectomy    . Partial removal of scrotum    . Cervical fusion      dr Vertell Limber 2009- retained hardware  . Hip fracture surgery  11-24-10    left hip per Dr, [name]  . Colonoscopy  2-08    per Dr. Oletta Lamas, normal   . Eye surgery      bilateral cataract extraction with IOL  . Lumbar laminectomy/decompression microdiscectomy  11/07/2011    Procedure: LUMBAR LAMINECTOMY/DECOMPRESSION MICRODISCECTOMY;  Surgeon: Johnn Hai, MD;  Location: [facility];  Service: Orthopedics;  Laterality: N/A;  Decompression of the L4 - L5 Central (X-ray)   . Total hip revision Left 11/05/2012    Procedure: Conversion of a Bipolar to a Left Total Hip Arthroplasty;  Surgeon: [name], MD;  Location: [facility];  Service: Orthopedics;  Laterality: Left;  Conversion of a Bipolar to a Left Total Hip Arthroplasty   HPI:  81 y.o. with  history of DM Type II HTN, HLD, CKD Anemia, stroke 09/2011 (spinal cord) presents with weakness and trembling.  CT no acute finding. Found to have sepsis from community acquired pneumonia vs aspiration pneumonia. CXR no change in patchy bibasilar airspace opacities which could be atelectasis or pneumonia. MBS in 2009 and 2010, no results obtainable.    Assessment / Plan / Recommendation Clinical Impression  Pt has occasional second swallows but otherwise is without overt signs of dysphagia or aspiration. Pt's only subjective complaint is difficulty swallowing his pills, which he takes whole in puree, several at a time. Suggested that patient try pills one at a time in puree or to crush them as needed. SLP to f/u briefly for tolerance due to PNA and h/o dysphagia.    Aspiration Risk  Mild    Diet Recommendation Age appropriate regular solids;Thin   Medication Administration: Whole meds with puree Compensations: Minimize environmental distractions;Slow rate;Small sips/bites    Other  Recommendations Oral Care Recommendations: Oral care BID   Follow Up Recommendations   none    Frequency and Duration min 1 x/week  1 week   Pertinent Vitals/Pain n/a    SLP Swallow Goals     Swallow Study  Prior Functional Status       General Other Pertinent Information: 81 y.o. with history of DM Type II HTN, HLD, CKD Anemia, stroke 09/2011 (spinal cord) presents with weakness and trembling.  CT no acute finding. Found to have sepsis from community acquired pneumonia vs aspiration pneumonia. CXR no change in patchy bibasilar airspace opacities which could be atelectasis or pneumonia. MBS in 2009 and 2010, no results obtainable.  Type of Study: Bedside swallow evaluation Previous Swallow Assessment: see HPI Diet Prior to this Study: NPO Temperature Spikes Noted: No Respiratory Status: Room air History of Recent Intubation: No Behavior/Cognition: Alert;Cooperative;Requires cueing;Other (Comment)  ("nervous") Oral Cavity - Dentition: Adequate natural dentition/normal for age Self-Feeding Abilities: Needs assist Patient Positioning: Upright in bed Baseline Vocal Quality: Normal    Oral/Motor/Sensory Function Overall Oral Motor/Sensory Function: Appears within functional limits for tasks assessed   Ice Chips Ice chips: Not tested   Thin Liquid Thin Liquid: Impaired Presentation: Cup;Self Fed;Straw Pharyngeal  Phase Impairments: Multiple swallows    Nectar Thick Nectar Thick Liquid: Not tested   Honey Thick Honey Thick Liquid: Not tested   Puree Puree: Impaired Presentation: Self Fed;Spoon Pharyngeal Phase Impairments: Multiple swallows   Solid    Solid: Within functional limits Presentation: Self Fed      Germain Osgood, M.A. CCC-SLP [phone]  Germain Osgood 04/15/2015,8:48 AM

## 2015-04-15 NOTE — Progress Notes (Signed)
[facility] PROGRESS NOTE  KAYCEE MCGAUGH CZY:[id number] DOB: 29-Dec-1926 DOA: 04/12/2015 PCP: Laurey Morale, MD Interim summary: Andrew Norton is a 81 y.o. male with history of DM Type II HTN HLD CKD Anemia presents with weakness and trembling,. In the ED he had a CXR which does show pneumonia with an elevated lactate.   Assessment/Plan: 1. Sepsis from community acquired pneumonia vs aspiration pneumonia: - much improved.  - started on IV antibiotics and IV fluids.  - changed antibiotics to primaxin for aspiration pneumonitis.  - blood cultures ordered and negative so far.  Urine fore strep antigen negative. Legionella antigen negative. Lactic acid normalized.  - fluid boluses as needed.    Hypertension: Controlled.   Hyperlipidemia: Holding statins for elevated liver function tests.  Much improved.   Diabetes mellitus: Holding metformin.  hgba1c 8 Resume SSI.  CBG (last 3)   Recent Labs  04/15/15 0740 04/15/15 1147 04/15/15 1626  GLUCAP 143* 243* 174*      Anemia: Normocytic , monitor.  Stool for occult blood pending.  Anemia panel ordered for tomorrow.   Elevated lactic acid : Resolved  Fluid boluses to keep map>65.   Elevated liver function tests: He denies any abdominal pain. Appear to be improving. Holding the statins.  Much improved.  US abdomen showed fatty infiltration.   An episode of syncope after choking on pills one week ago, has a h/o dysphagia after neck surgery int he past.  SLP eval  Cleared him for regular diet.   Code Status: full code Family Communication: wife at bedside.  Disposition Plan: home tomorrow.    Consultants:  Speech and swallow eval   Procedures:  none  Antibiotics:  Vancomycin 9/28  Rocephin  zithromax  HPI/Subjective: Reports feeling good. No shaking or trembling.  Feels tired.  Objective: Filed Vitals:   04/15/15 1433  BP: 148/68  Pulse: 78  Temp: 97.7 F (36.5 C)  Resp: 18    Intake/Output  Summary (Last 24 hours) at 04/15/15 1437 Last data filed at 04/15/15 1300  Gross per 24 hour  Intake 2345.83 ml  Output   1775 ml  Net 570.83 ml   Filed Weights   04/13/15 0043  Weight: 78.6 kg (173 lb 4.5 oz)    Exam:   General:  Alert afebrile comfortable  Cardiovascular: s1s2  Respiratory: clear to auscultation bilaterally,   Abdomen: soft NT ND BS+  Musculoskeletal: no pedal edema.  Data Reviewed: Basic Metabolic Panel:  Recent Labs Lab 04/12/15 2222 04/13/15 0137 04/13/15 0423 04/14/15 0429  NA 135 135 135 134*  K 4.3 3.5 3.8 4.0  CL 105 107 106 105  CO2 22 22 22 22   GLUCOSE 257* 164* 177* 65  BUN 22* 22* 20 15  CREATININE 1.00 0.93 0.81 0.75  CALCIUM 8.3* 8.0* 7.5* 7.8*   Liver Function Tests:  Recent Labs Lab 04/13/15 0137 04/13/15 0423 04/14/15 0429  AST 1146* 820* 254*  ALT 653* 597* 377*  ALKPHOS 191* 164* 149*  BILITOT 1.4* 1.7* 2.9*  PROT 6.4* 5.7* 5.8*  ALBUMIN 3.5 3.2* 3.2*   No results for input(s): LIPASE, AMYLASE in the last 168 hours. No results for input(s): AMMONIA in the last 168 hours. CBC:  Recent Labs Lab 04/12/15 2222 04/13/15 0137 04/13/15 0423 04/14/15 0429  WBC 4.4 6.8 9.4 10.9*  NEUTROABS 4.1 6.2  --   --   HGB 10.4* 9.3* 9.1* 8.9*  HCT 31.3* 28.1* 27.1* 26.2*  MCV 92.9 93.4 91.9 89.7  PLT 182 167 158 152   Cardiac Enzymes: No results for input(s): CKTOTAL, CKMB, CKMBINDEX, TROPONINI in the last 168 hours. BNP (last 3 results) No results for input(s): BNP in the last 8760 hours.  ProBNP (last 3 results) No results for input(s): PROBNP in the last 8760 hours.  CBG:  Recent Labs Lab 05-14-2015 1154 2015/05/14 1637 2015-05-14 2148 04/15/15 0740 04/15/15 1147  GLUCAP 151* 168* 145* 143* 243*    Recent Results (from the past 240 hour(s))  Culture, blood (x 2)     Status: None (Preliminary result)   Collection Time: 04/13/15  1:37 AM  Result Value Ref Range Status   Specimen Description BLOOD LEFT  ANTECUBITAL  Final   Special Requests   Final    BOTTLES DRAWN AEROBIC AND ANAEROBIC [place] RED   Culture   Final    NO GROWTH 2 DAYS Performed at [facility]    Report Status PENDING  Incomplete  Culture, blood (x 2)     Status: None (Preliminary result)   Collection Time: 04/13/15  1:45 AM  Result Value Ref Range Status   Specimen Description BLOOD LEFT HAND  Final   Special Requests IN PEDIATRIC BOTTLE 3CC  Final   Culture   Final    NO GROWTH 2 DAYS Performed at [facility]    Report Status PENDING  Incomplete     Studies: US Abdomen Limited Ruq  05-14-2015   CLINICAL DATA:  Elevated liver function tests  EXAM: US ABDOMEN LIMITED - RIGHT UPPER QUADRANT  COMPARISON:  None.  FINDINGS: Gallbladder:  No gallstones or wall thickening visualized. No sonographic Murphy sign noted.  Common bile duct:  Diameter: 3.7 mm.  Liver:  Slight increased echogenicity is noted which may be related to fatty infiltration. A small pleural effusion is noted on the right.  IMPRESSION: Small right pleural effusion.  Mild fatty infiltration of the liver.   Electronically Signed   By: Inez Catalina M.D.   On: May 14, 2015 20:44    Scheduled Meds: . aspirin  81 mg Oral Daily  . [START ON 04/25/2015] cyanocobalamin  1,000 mcg Intramuscular Q30 days  . finasteride  5 mg Oral Daily  . folic acid  1 mg Oral Daily  . gabapentin  400 mg Oral QID  . heparin  5,000 Units Subcutaneous 3 times per day  . imipenem-cilastatin  500 mg Intravenous Q8H  . insulin aspart  0-15 Units Subcutaneous TID WC  . multivitamin with minerals  1 tablet Oral Daily  . thiamine  100 mg Oral Daily   Continuous Infusions:    Active Problems:   Diabetes mellitus without complication   Essential hypertension   Aspiration pneumonia   Sepsis   Hyperlipidemia   Lobar pneumonia due to unspecified organism    Time spent: 25 minutes    [facility] Pager [phone]. If 7PM-7AM, please  contact night-coverage at [email or web address], password [facility] 04/15/2015, 2:37 PM  LOS: 3 days

## 2015-04-16 LAB — HEPATIC FUNCTION PANEL
ALBUMIN: 3.2 g/dL — AB (ref 3.5–5.0)
ALK PHOS: 157 U/L — AB (ref 38–126)
ALT: 169 U/L — ABNORMAL HIGH (ref 17–63)
AST: 44 U/L — ABNORMAL HIGH (ref 15–41)
BILIRUBIN DIRECT: 0.7 mg/dL — AB (ref 0.1–0.5)
BILIRUBIN INDIRECT: 0.9 mg/dL (ref 0.3–0.9)
BILIRUBIN TOTAL: 1.6 mg/dL — AB (ref 0.3–1.2)
Total Protein: 6.4 g/dL — ABNORMAL LOW (ref 6.5–8.1)

## 2015-04-16 LAB — IRON AND TIBC
IRON: 30 ug/dL — AB (ref 45–182)
Saturation Ratios: 13 % — ABNORMAL LOW (ref 17.9–39.5)
TIBC: 225 ug/dL — AB (ref 250–450)
UIBC: 195 ug/dL

## 2015-04-16 LAB — CBC
HEMATOCRIT: 28.9 % — AB (ref 39.0–52.0)
HEMOGLOBIN: 10.1 g/dL — AB (ref 13.0–17.0)
MCH: 30.7 pg (ref 26.0–34.0)
MCHC: 34.9 g/dL (ref 30.0–36.0)
MCV: 87.8 fL (ref 78.0–100.0)
Platelets: 200 10*3/uL (ref 150–400)
RBC: 3.29 MIL/uL — ABNORMAL LOW (ref 4.22–5.81)
RDW: 13.1 % (ref 11.5–15.5)
WBC: 9.5 10*3/uL (ref 4.0–10.5)

## 2015-04-16 LAB — FOLATE: FOLATE: 21.1 ng/mL (ref >5.9)

## 2015-04-16 LAB — RETICULOCYTES
RBC.: 3.29 MIL/uL — ABNORMAL LOW (ref 4.22–5.81)
RETIC COUNT ABSOLUTE: 42.8 10*3/uL (ref 19.0–186.0)
Retic Ct Pct: 1.3 % (ref 0.4–3.1)

## 2015-04-16 LAB — LIPID PANEL
CHOLESTEROL: 112 mg/dL (ref 0–200)
HDL: 29 mg/dL — ABNORMAL LOW (ref >40)
LDL Cholesterol: 54 mg/dL (ref 0–99)
Total CHOL/HDL Ratio: 3.9 RATIO
Triglycerides: 147 mg/dL (ref <150)
VLDL: 29 mg/dL (ref 0–40)

## 2015-04-16 LAB — GLUCOSE, CAPILLARY
GLUCOSE-CAPILLARY: 171 mg/dL — AB (ref 65–99)
Glucose-Capillary: 255 mg/dL — ABNORMAL HIGH (ref 65–99)

## 2015-04-16 LAB — FERRITIN: Ferritin: 288 ng/mL (ref 24–336)

## 2015-04-16 LAB — VITAMIN B12: Vitamin B-12: 321 pg/mL (ref 180–914)

## 2015-04-16 MED ORDER — FOLIC ACID 1 MG PO TABS: 1.0000 mg | ORAL_TABLET | Freq: Every day | ORAL | Status: DC

## 2015-04-16 MED ORDER — LEVOFLOXACIN 500 MG PO TABS: 500.0000 mg | ORAL_TABLET | Freq: Every day | ORAL | Status: DC

## 2015-04-16 MED ORDER — ADULT MULTIVITAMIN W/MINERALS CH: 1.0000 | ORAL_TABLET | Freq: Every day | ORAL | Status: DC

## 2015-04-16 MED ORDER — THIAMINE HCL 100 MG PO TABS: 100.0000 mg | ORAL_TABLET | Freq: Every day | ORAL | Status: DC

## 2015-04-17 NOTE — Discharge Summary (Addendum)
Physician Discharge Summary  Andrew Norton:[id number] DOB: [birth year] DOA: 04/12/2015  PCP: Laurey Morale, MD  Admit date: 04/12/2015 Discharge date: 04/16/2015  Time spent: 30 minutes  Recommendations for Outpatient Follow-up:  1. Follow up with PCP in one week,  2. We have stopped your statin for elevated liver function tests.   Discharge Diagnoses:  Active Problems:   Diabetes mellitus without complication (Andrew Norton)   Essential hypertension   Aspiration pneumonia (HCC)   Sepsis (Andrew Norton)   Hyperlipidemia   Lobar pneumonia due to unspecified organism   Discharge Condition: improved.   Diet recommendation:carb modified diet  Filed Weights   04/13/15 0043  Weight: 78.6 kg (173 lb 4.5 oz)    History of present illness:  Andrew Norton is a 81 y.o. male with history of DM Type II HTN HLD CKD Anemia presents with weakness and trembling,. In the ED he had a CXR which does show pneumonia with an elevated lactate. He was admitted and treated for community acquired pneumonia vs aspiration pneumonitis.   Hospital Course:  Assessment/Plan: 1. Sepsis/SIRS from community acquired pneumonia vs aspiration pneumonia: On admission, he had chills, was tachycardic, tachypnea, and lactic acid of 4.  - much improved.  - started on IV antibiotics and IV fluids., discharged on oral antibiotics to complete the course.  - blood cultures ordered and negative so far.  Urine fore strep antigen negative. Legionella antigen negative. Lactic acid normalized.     Hypertension: Controlled.   Hyperlipidemia: Holding statins for elevated liver function tests.  Much improved.   Diabetes mellitus: Resume home meds and recommend dietary modifications and follow upw ith PCP regarding better blood sugar control.   hgba1c 8  CBG (last 3)   Recent Labs (last 2 labs)      Recent Labs  04/15/15 0740 04/15/15 1147 04/15/15 1626  GLUCAP 143* 243* 174*        Anemia: Normocytic , monitor.   Continue with VIT b12 injections.   Elevated lactic acid : Resolved    Elevated liver function tests: He denies any abdominal pain. Appear to be improving. Holding the statins.  Much improved.  US abdomen showed fatty infiltration.   An episode of syncope after choking on pills one week ago, has a h/o dysphagia after neck surgery int he past.  SLP eval Cleared him for regular diet.        Procedures:   None  Consultations:  SLP eval  Physical therapy.   Discharge Exam: Filed Vitals:   04/16/15 1009  BP: 136/75  Pulse: 124  Temp: 98.3 F (36.8 C)  Resp: 16    General: alert afebrile comfortable Cardiovascular: s1s2 Respiratory: ctab.  Discharge Instructions   Discharge Instructions    Diet Carb Modified    Complete by:  As directed      Discharge instructions    Complete by:  As directed   FOLLOW UP with PCP in one week.  We have held your simvastatin as your liver function tests were elevated, they have improved much, recommend checking them in 4 weeks and restart simvastatin as per your PCP.          Discharge Medication List as of 04/16/2015 12:15 PM    START taking these medications   Details  folic acid (FOLVITE) 1 MG tablet Take 1 tablet (1 mg total) by mouth daily., Starting 04/16/2015, Until Discontinued, No Print    levofloxacin (LEVAQUIN) 500 MG tablet Take 1 tablet (500 mg total) by mouth  daily., Starting 04/16/2015, Until Discontinued, Print    Multiple Vitamin (MULTIVITAMIN WITH MINERALS) TABS tablet Take 1 tablet by mouth daily., Starting 04/16/2015, Until Discontinued, No Print    thiamine 100 MG tablet Take 1 tablet (100 mg total) by mouth daily., Starting 04/16/2015, Until Discontinued, No Print      CONTINUE these medications which have NOT CHANGED   Details  aspirin 81 MG chewable tablet Chew 1 tablet (81 mg total) by mouth daily., Starting 11/24/2012, Until Discontinued, No Print    cyclobenzaprine (FLEXERIL) 10 MG tablet  TAKE 1 TABLET 3 TIMES DAILYAS NEEDED FOR MUSCLE SPASMS, Normal    Ferrous Sulfate 220 (44 FE) MG/5ML LIQD TAKE 7 MLS BY MOUTH DAILY, Normal    finasteride (PROSCAR) 5 MG tablet Take 1 tablet (5 mg total) by mouth daily., Starting 07/14/2014, Until Discontinued, Normal    gabapentin (NEURONTIN) 400 MG capsule Take 1 capsule (400 mg total) by mouth 4 (four) times daily., Starting 07/28/2014, Until Discontinued, Normal    glipiZIDE (GLUCOTROL) 10 MG tablet TAKE 1 TABLET TWICE A DAY  BEFORE A MEAL, Normal    HYDROcodone-acetaminophen (NORCO/VICODIN) 5-325 MG per tablet Take 1 tablet by mouth every 8 (eight) hours as needed for moderate pain., Starting 03/25/2015, Until Discontinued, Print    lidocaine (LIDODERM) 5 % Place 1 patch onto the skin daily. Remove & Discard patch within 12 hours or as directed by MD, Starting 07/28/2014, Until Discontinued, Normal    losartan (COZAAR) 50 MG tablet Take 1 tablet (50 mg total) by mouth every morning., Starting 07/14/2014, Until Discontinued, Normal    metFORMIN (GLUCOPHAGE) 1000 MG tablet Take 1 tablet (1,000 mg total) by mouth 2 (two) times daily with a meal., Starting 07/20/2014, Until Discontinued, Normal    polyethylene glycol powder (GLYCOLAX/MIRALAX) powder MIX 1 GRAM(1 CAPFUL) IN LIQUID OF CHOICE AND DRINK ONCE DAILY AS DIRECTED, Normal    cyanocobalamin (,VITAMIN B-12,) 1000 MCG/ML injection Inject 1,000 mcg into the muscle every 30 (thirty) days., Until Discontinued, Historical Med      STOP taking these medications     simvastatin (ZOCOR) 40 MG tablet      triamcinolone cream (KENALOG) 0.1 %        Allergies  Allergen Reactions  . Penicillins Hives    Has patient had a PCN reaction causing immediate rash, facial/tongue/throat swelling, SOB or lightheadedness with hypotension: No Has patient had a PCN reaction causing severe rash involving mucus membranes or skin necrosis: No Has patient had a PCN reaction that required hospitalization  No Has patient had a PCN reaction occurring within the last 10 years: If all of the above answers are "NO", then may proceed with Cephalosporin use.    Follow-up Information    Follow up with FRY,STEPHEN A, MD. Schedule an appointment as soon as possible for a visit in 1 week.   Specialty:  Family Medicine   Contact information:   [place] [address] [phone]        The results of significant diagnostics from this hospitalization (including imaging, microbiology, ancillary and laboratory) are listed below for reference.    Significant Diagnostic Studies: Dg Chest 2 View  04/12/2015   CLINICAL DATA:  The patient was working around the house and started feeling very shaky today.  EXAM: CHEST  2 VIEW  COMPARISON:  October 23, 2012  FINDINGS: The heart size and mediastinal contours are within normal limits. There is patchy opacity of right lung base. There is no pulmonary  edema or pleural effusion. The visualized skeletal structures are stable.  IMPRESSION: Patchy opacity of the right lung base suspicious for developing pneumonia.   Electronically Signed   By: Abelardo Diesel M.D.   On: 04/12/2015 22:15   Ct Head Wo Contrast  04/13/2015   CLINICAL DATA:  Syncopal episodes  EXAM: CT HEAD WITHOUT CONTRAST  TECHNIQUE: Contiguous axial images were obtained from the base of the skull through the vertex without intravenous contrast.  COMPARISON:  01/13/2008  FINDINGS: Skull and Sinuses:Negative for fracture or destructive process. The mastoids, middle ears, and imaged paranasal sinuses are clear.  Orbits: Bilateral cataract resection.  No acute finding.  Brain: No evidence of acute infarction, hemorrhage, hydrocephalus, or mass lesion/mass effect.  Cortical atrophy which is especially prominent at the vertex, pattern similar to 2009.  Expected small-vessel ischemic changes in the deep cerebral white matter.  IMPRESSION: 1. No acute finding. 2. Cortical atrophy with posterior  frontal and parietal predilection, pattern similar to 2009.   Electronically Signed   By: Monte Fantasia M.D.   On: 04/13/2015 00:24   Dg Chest Port 1 View  04/13/2015   CLINICAL DATA:  Weakness.  Elevated lactic acid.  EXAM: PORTABLE CHEST 1 VIEW  COMPARISON:  PA and lateral chest 04/12/2015.  FINDINGS: There is new mild interstitial pulmonary edema. Patchy bibasilar airspace opacities are again seen. Heart size is normal. No pneumothorax. There are likely trace pleural effusions.  IMPRESSION: Mild interstitial edema appears new since yesterday's examination.  No change in patchy bibasilar airspace opacities which could be atelectasis or pneumonia.  Trace pleural effusions.   Electronically Signed   By: Inge Rise M.D.   On: 04/13/2015 07:53   US Abdomen Limited Ruq  04/14/2015   CLINICAL DATA:  Elevated liver function tests  EXAM: US ABDOMEN LIMITED - RIGHT UPPER QUADRANT  COMPARISON:  None.  FINDINGS: Gallbladder:  No gallstones or wall thickening visualized. No sonographic Murphy sign noted.  Common bile duct:  Diameter: 3.7 mm.  Liver:  Slight increased echogenicity is noted which may be related to fatty infiltration. A small pleural effusion is noted on the right.  IMPRESSION: Small right pleural effusion.  Mild fatty infiltration of the liver.   Electronically Signed   By: Inez Catalina M.D.   On: 04/14/2015 20:44    Microbiology: Recent Results (from the past 240 hour(s))  Culture, blood (x 2)     Status: None (Preliminary result)   Collection Time: 04/13/15  1:37 AM  Result Value Ref Range Status   Specimen Description BLOOD LEFT ANTECUBITAL  Final   Special Requests   Final    BOTTLES DRAWN AEROBIC AND ANAEROBIC [place] RED   Culture   Final    NO GROWTH 3 DAYS Performed at [facility]    Report Status PENDING  Incomplete  Culture, blood (x 2)     Status: None (Preliminary result)   Collection Time: 04/13/15  1:45 AM  Result Value Ref Range Status   Specimen  Description BLOOD LEFT HAND  Final   Special Requests IN PEDIATRIC BOTTLE 3CC  Final   Culture   Final    NO GROWTH 3 DAYS Performed at [facility]    Report Status PENDING  Incomplete     Labs: Basic Metabolic Panel:  Recent Labs Lab 04/12/15 2222 04/13/15 0137 04/13/15 0423 04/14/15 0429  NA 135 135 135 134*  K 4.3 3.5 3.8 4.0  CL 105 107 106 105  CO2 22 22 22 22   GLUCOSE 257* 164* 177* 65  BUN 22* 22* 20 15  CREATININE 1.00 0.93 0.81 0.75  CALCIUM 8.3* 8.0* 7.5* 7.8*   Liver Function Tests:  Recent Labs Lab 04/13/15 0137 04/13/15 0423 04/14/15 0429 04/16/15 0544  AST 1146* 820* 254* 44*  ALT 653* 597* 377* 169*  ALKPHOS 191* 164* 149* 157*  BILITOT 1.4* 1.7* 2.9* 1.6*  PROT 6.4* 5.7* 5.8* 6.4*  ALBUMIN 3.5 3.2* 3.2* 3.2*   No results for input(s): LIPASE, AMYLASE in the last 168 hours. No results for input(s): AMMONIA in the last 168 hours. CBC:  Recent Labs Lab 04/12/15 2222 04/13/15 0137 04/13/15 0423 04/14/15 0429 04/16/15 0544  WBC 4.4 6.8 9.4 10.9* 9.5  NEUTROABS 4.1 6.2  --   --   --   HGB 10.4* 9.3* 9.1* 8.9* 10.1*  HCT 31.3* 28.1* 27.1* 26.2* 28.9*  MCV 92.9 93.4 91.9 89.7 87.8  PLT 182 167 158 152 200   Cardiac Enzymes: No results for input(s): CKTOTAL, CKMB, CKMBINDEX, TROPONINI in the last 168 hours. BNP: BNP (last 3 results) No results for input(s): BNP in the last 8760 hours.  ProBNP (last 3 results) No results for input(s): PROBNP in the last 8760 hours.  CBG:  Recent Labs Lab 04/15/15 1147 04/15/15 1626 04/15/15 2111 04/16/15 0737 04/16/15 1136  GLUCAP 243* 174* 189* 171* 255*       Signed:  Nakaiya Beddow  [facility] 04/17/2015, 10:23 AM

## 2015-04-18 LAB — CULTURE, BLOOD (ROUTINE X 2)
CULTURE: NO GROWTH
Culture: NO GROWTH

## 2015-04-20 DIAGNOSIS — E1151 Type 2 diabetes mellitus with diabetic peripheral angiopathy without gangrene: Secondary | ICD-10-CM | POA: Diagnosis not present

## 2015-04-20 DIAGNOSIS — L602 Onychogryphosis: Secondary | ICD-10-CM | POA: Diagnosis not present

## 2015-04-22 ENCOUNTER — Ambulatory Visit (INDEPENDENT_AMBULATORY_CARE_PROVIDER_SITE_OTHER): Payer: Medicare Other | Admitting: Family Medicine

## 2015-04-22 ENCOUNTER — Encounter: Payer: Self-pay | Admitting: Family Medicine

## 2015-04-22 VITALS — BP 143/67 | HR 82 | Temp 98.2°F | Ht 70.0 in | Wt 161.0 lb

## 2015-04-22 DIAGNOSIS — I10 Essential (primary) hypertension: Secondary | ICD-10-CM | POA: Diagnosis not present

## 2015-04-22 DIAGNOSIS — M25511 Pain in right shoulder: Secondary | ICD-10-CM

## 2015-04-22 DIAGNOSIS — J690 Pneumonitis due to inhalation of food and vomit: Secondary | ICD-10-CM | POA: Diagnosis not present

## 2015-04-22 DIAGNOSIS — E538 Deficiency of other specified B group vitamins: Secondary | ICD-10-CM

## 2015-04-22 MED ORDER — HYDROCODONE-ACETAMINOPHEN 5-325 MG PO TABS: 1.0000 | ORAL_TABLET | Freq: Four times a day (QID) | ORAL | Status: DC | PRN

## 2015-04-22 MED ORDER — CYANOCOBALAMIN 1000 MCG/ML IJ SOLN
1000.0000 ug | Freq: Once | INTRAMUSCULAR | Status: AC
  Administered 2015-04-22: 1000 ug via INTRAMUSCULAR

## 2015-04-22 NOTE — Progress Notes (Signed)
Pre visit review using our clinic review tool, if applicable. No additional management support is needed unless otherwise documented below in the visit note. 

## 2015-04-22 NOTE — Progress Notes (Signed)
   Subjective:    Patient ID: Andrew Norton, male    DOB: 18-May-1927, 81 y.o.   MRN: [id number]  HPI Here to follow up a hospital stay from 04-12-15 to 04-16-15 for an aspiration pneumonia. No specific agent was identified and all cultures remained negative. He now feels much better except for some residual weakness. His appetite is good and he is getting good food and liquid intake. No fevers. No coughing or SOB. He does complain of right shoulder pain for the past few weeks since before he went into the hospital. No recent trauma. His Norco helps take the edge off the pain.    Review of Systems  Constitutional: Positive for fatigue. Negative for fever, chills and diaphoresis.  Respiratory: Negative.   Cardiovascular: Negative.   Gastrointestinal: Negative.   Genitourinary: Negative.   Musculoskeletal: Positive for back pain, arthralgias and neck pain.  Neurological: Negative.        Objective:   Physical Exam  Constitutional: He is oriented to person, place, and time. He appears well-developed and well-nourished. No distress.  Neck: No thyromegaly present.  Cardiovascular: Normal rate, regular rhythm, normal heart sounds and intact distal pulses.   Pulmonary/Chest: Effort normal and breath sounds normal.  Musculoskeletal:  The anterior right shoulder is tender but there is little crepitus. ROM is full for abduction and adduction, but internal/external rotation is limited by pain  Lymphadenopathy:    He has no cervical adenopathy.  Neurological: He is alert and oriented to person, place, and time.          Assessment & Plan:  His pneumonia appears to be resolved. He will maximize his nutrition, but I cautioned him that it will be some weeks yet before all is strength returns. Refilled his Norco. He has bursitis in the shoulder so we will refer him back to Dr. Nelva Bush for this. He will return in 3 weeks for an exam and we will recheck his liver enzymes at that time. Stay off the statin  until then.

## 2015-04-22 NOTE — Addendum Note (Signed)
Addended by: Aggie Hacker A on: 04/22/2015 02:27 PM   Modules accepted: Orders

## 2015-04-27 DIAGNOSIS — M25511 Pain in right shoulder: Secondary | ICD-10-CM | POA: Diagnosis not present

## 2015-05-18 DIAGNOSIS — H401122 Primary open-angle glaucoma, left eye, moderate stage: Secondary | ICD-10-CM | POA: Diagnosis not present

## 2015-05-18 DIAGNOSIS — H1851 Endothelial corneal dystrophy: Secondary | ICD-10-CM | POA: Diagnosis not present

## 2015-05-26 ENCOUNTER — Ambulatory Visit (INDEPENDENT_AMBULATORY_CARE_PROVIDER_SITE_OTHER): Payer: Medicare Other | Admitting: Family Medicine

## 2015-05-26 DIAGNOSIS — E538 Deficiency of other specified B group vitamins: Secondary | ICD-10-CM

## 2015-05-26 MED ORDER — CYANOCOBALAMIN 1000 MCG/ML IJ SOLN
1000.0000 ug | Freq: Once | INTRAMUSCULAR | Status: AC
  Administered 2015-05-26: 1000 ug via INTRAMUSCULAR

## 2015-05-27 DIAGNOSIS — L570 Actinic keratosis: Secondary | ICD-10-CM | POA: Diagnosis not present

## 2015-05-27 DIAGNOSIS — Z85820 Personal history of malignant melanoma of skin: Secondary | ICD-10-CM | POA: Diagnosis not present

## 2015-05-27 DIAGNOSIS — C44320 Squamous cell carcinoma of skin of unspecified parts of face: Secondary | ICD-10-CM | POA: Diagnosis not present

## 2015-05-27 DIAGNOSIS — Z1283 Encounter for screening for malignant neoplasm of skin: Secondary | ICD-10-CM | POA: Diagnosis not present

## 2015-05-27 DIAGNOSIS — D225 Melanocytic nevi of trunk: Secondary | ICD-10-CM | POA: Diagnosis not present

## 2015-05-27 DIAGNOSIS — X32XXXD Exposure to sunlight, subsequent encounter: Secondary | ICD-10-CM | POA: Diagnosis not present

## 2015-05-27 DIAGNOSIS — Z08 Encounter for follow-up examination after completed treatment for malignant neoplasm: Secondary | ICD-10-CM | POA: Diagnosis not present

## 2015-05-27 DIAGNOSIS — C44329 Squamous cell carcinoma of skin of other parts of face: Secondary | ICD-10-CM | POA: Diagnosis not present

## 2015-05-30 ENCOUNTER — Telehealth: Payer: Self-pay | Admitting: Family Medicine

## 2015-05-30 NOTE — Telephone Encounter (Signed)
Refill request for Lidocaine 5 % patch and send to [facility]. Also fax form for prior authorization, it's at desk.

## 2015-05-31 MED ORDER — LIDOCAINE 5 % EX PTCH: 1.0000 | MEDICATED_PATCH | CUTANEOUS | Status: AC

## 2015-05-31 NOTE — Telephone Encounter (Signed)
This was faxed

## 2015-05-31 NOTE — Telephone Encounter (Signed)
Ready to be faxed.

## 2015-06-22 DIAGNOSIS — I70293 Other atherosclerosis of native arteries of extremities, bilateral legs: Secondary | ICD-10-CM | POA: Diagnosis not present

## 2015-06-22 DIAGNOSIS — E1351 Other specified diabetes mellitus with diabetic peripheral angiopathy without gangrene: Secondary | ICD-10-CM | POA: Diagnosis not present

## 2015-06-22 DIAGNOSIS — L602 Onychogryphosis: Secondary | ICD-10-CM | POA: Diagnosis not present

## 2015-06-24 DIAGNOSIS — Z08 Encounter for follow-up examination after completed treatment for malignant neoplasm: Secondary | ICD-10-CM | POA: Diagnosis not present

## 2015-06-24 DIAGNOSIS — L570 Actinic keratosis: Secondary | ICD-10-CM | POA: Diagnosis not present

## 2015-06-24 DIAGNOSIS — X32XXXD Exposure to sunlight, subsequent encounter: Secondary | ICD-10-CM | POA: Diagnosis not present

## 2015-06-24 DIAGNOSIS — Z85828 Personal history of other malignant neoplasm of skin: Secondary | ICD-10-CM | POA: Diagnosis not present

## 2015-06-24 DIAGNOSIS — D045 Carcinoma in situ of skin of trunk: Secondary | ICD-10-CM | POA: Diagnosis not present

## 2015-06-27 ENCOUNTER — Ambulatory Visit (INDEPENDENT_AMBULATORY_CARE_PROVIDER_SITE_OTHER): Payer: Medicare Other | Admitting: Family Medicine

## 2015-06-27 DIAGNOSIS — E538 Deficiency of other specified B group vitamins: Secondary | ICD-10-CM

## 2015-06-27 MED ORDER — CYANOCOBALAMIN 1000 MCG/ML IJ SOLN
1000.0000 ug | Freq: Once | INTRAMUSCULAR | Status: AC
  Administered 2015-06-27: 1000 ug via INTRAMUSCULAR

## 2015-06-29 ENCOUNTER — Other Ambulatory Visit: Payer: Self-pay | Admitting: Family Medicine

## 2015-07-13 ENCOUNTER — Telehealth: Payer: Self-pay | Admitting: Family Medicine

## 2015-07-13 NOTE — Telephone Encounter (Signed)
Scheduled

## 2015-07-13 NOTE — Telephone Encounter (Signed)
Can you put pt on schedule in the hold spot for Friday 07/15/2015 at 3:15 pm to see Dr. Sarajane Jews for constipation? I have already spoken with pt and he is aware of this appointment.

## 2015-07-13 NOTE — Telephone Encounter (Signed)
I spoke pt and he did have a bowel movement on Tuesday 07/12/2015, that was due to taking over the counter supplements. Pt did schedule for Friday 07/15/2015 to see Dr. Sarajane Jews and he is aware.

## 2015-07-13 NOTE — Telephone Encounter (Signed)
Andrew Norton called saying he's had "complications" with being constipated a little longer than a month. He was wondering if he could be seen before Friday. He didn't feel comfortable explaining to me what his "complications" are. Please give him a phone call.  Pt's ph# [phone] Thank you.

## 2015-07-14 ENCOUNTER — Telehealth: Payer: Self-pay | Admitting: Family Medicine

## 2015-07-14 NOTE — Telephone Encounter (Signed)
Andrew Norton called saying he can't wait until tomorrow due to having complications with constipation. He really wants to see Dr. Sarajane Jews today if possible. He's asking for a return phone call regarding this.  Pt's ph# [phone] Thank you.

## 2015-07-14 NOTE — Telephone Encounter (Signed)
I spoke with pt and went over below information. 

## 2015-07-14 NOTE — Telephone Encounter (Signed)
Per Dr. Sarajane Jews pt should take Milk of magnesia 1 tbsp 3 x a day and Miralax 2 x a day with plenty of water. Pt is already on schedule for Friday 07/15/2015 to see Dr. Sarajane Jews. I called pt and left this message.

## 2015-07-15 ENCOUNTER — Encounter: Payer: Self-pay | Admitting: Family Medicine

## 2015-07-15 ENCOUNTER — Ambulatory Visit (INDEPENDENT_AMBULATORY_CARE_PROVIDER_SITE_OTHER): Payer: Medicare Other | Admitting: Family Medicine

## 2015-07-15 VITALS — BP 156/69 | HR 76 | Temp 97.8°F | Ht 70.0 in | Wt 170.0 lb

## 2015-07-15 DIAGNOSIS — K5900 Constipation, unspecified: Secondary | ICD-10-CM

## 2015-07-15 MED ORDER — LUBIPROSTONE 24 MCG PO CAPS: 24.0000 ug | ORAL_CAPSULE | Freq: Two times a day (BID) | ORAL | Status: DC

## 2015-07-15 NOTE — Progress Notes (Signed)
Pre visit review using our clinic review tool, if applicable. No additional management support is needed unless otherwise documented below in the visit note. 

## 2015-07-18 ENCOUNTER — Encounter: Payer: Self-pay | Admitting: Family Medicine

## 2015-07-18 DIAGNOSIS — K5900 Constipation, unspecified: Secondary | ICD-10-CM | POA: Insufficient documentation

## 2015-07-18 NOTE — Progress Notes (Signed)
   Subjective:    Patient ID: Andrew Norton, male    DOB: 18-Apr-1927, 82 y.o.   MRN: [id number]  HPI Her to complain of chronic constipation. This has been an issue for him for 20 years but lately it is worse than ever. He averages one BM a week. He feels bloated and has mild abdominal pain frequently. His appetite is normal. No fever or nausea. He has tried eating fiber rich foods, drinking lots of water, taking MOM tid, taking Miralax bid, Dulculax etc. With little response.    Review of Systems  Constitutional: Negative.   Respiratory: Negative.   Cardiovascular: Negative.   Gastrointestinal: Positive for constipation. Negative for nausea, vomiting, abdominal pain, diarrhea, blood in stool, abdominal distention, anal bleeding and rectal pain.       Objective:   Physical Exam  Constitutional: He appears well-developed and well-nourished.  Cardiovascular: Normal rate, regular rhythm, normal heart sounds and intact distal pulses.   Pulmonary/Chest: Effort normal and breath sounds normal. No respiratory distress.  Abdominal: Soft. Bowel sounds are normal. He exhibits no distension and no mass. There is no tenderness. There is no rebound and no guarding.          Assessment & Plan:  Chronic constipation, partly due to side effects of narcotic meds. Try Amitiza 24 mcg bid.

## 2015-07-28 ENCOUNTER — Ambulatory Visit (INDEPENDENT_AMBULATORY_CARE_PROVIDER_SITE_OTHER): Payer: Medicare Other | Admitting: Family Medicine

## 2015-07-28 DIAGNOSIS — E538 Deficiency of other specified B group vitamins: Secondary | ICD-10-CM | POA: Diagnosis not present

## 2015-07-28 MED ORDER — CYANOCOBALAMIN 1000 MCG/ML IJ SOLN
1000.0000 ug | Freq: Once | INTRAMUSCULAR | Status: AC
  Administered 2015-07-28: 1000 ug via INTRAMUSCULAR

## 2015-07-29 ENCOUNTER — Other Ambulatory Visit: Payer: Self-pay | Admitting: Family Medicine

## 2015-07-29 NOTE — Telephone Encounter (Signed)
Can we refill this? 

## 2015-08-02 ENCOUNTER — Telehealth: Payer: Self-pay | Admitting: Family Medicine

## 2015-08-02 NOTE — Telephone Encounter (Signed)
Pt needs new rx hydrocodone °

## 2015-08-03 ENCOUNTER — Telehealth: Payer: Self-pay | Admitting: Family Medicine

## 2015-08-03 MED ORDER — HYDROCODONE-ACETAMINOPHEN 5-325 MG PO TABS: 1.0000 | ORAL_TABLET | Freq: Four times a day (QID) | ORAL | Status: DC | PRN

## 2015-08-03 MED ORDER — HYDROCODONE-ACETAMINOPHEN 5-325 MG PO TABS
1.0000 | ORAL_TABLET | Freq: Four times a day (QID) | ORAL | Status: DC | PRN
Start: 2015-08-03 — End: 2015-11-01

## 2015-08-03 NOTE — Telephone Encounter (Signed)
done

## 2015-08-03 NOTE — Telephone Encounter (Signed)
Left message for patient to return phone call.  

## 2015-08-03 NOTE — Telephone Encounter (Signed)
Zarna w/[facility] expressed that they have been looking for a prior authorization for Amitiza for a while now.  Please call [phone].

## 2015-08-03 NOTE — Telephone Encounter (Signed)
Prior Auth that was submitted 07/27/15 has still not been reviewed. I submitted another prior auth in hopes to receive response from this one.  Spoke with Zarma & notified her that another Prior Josem Kaufmann has been completed and submitted.

## 2015-08-03 NOTE — Telephone Encounter (Signed)
Caryl Pina from [employer] needs additional questions answered.  She is going to fax another form with the specific questions that need to be answered

## 2015-08-03 NOTE — Telephone Encounter (Signed)
Form completed - placed on Dr. Barbie Banner desk for signature to fax back.

## 2015-08-03 NOTE — Telephone Encounter (Signed)
Form faxed

## 2015-08-05 ENCOUNTER — Other Ambulatory Visit: Payer: Self-pay | Admitting: Family Medicine

## 2015-08-05 DIAGNOSIS — L570 Actinic keratosis: Secondary | ICD-10-CM | POA: Diagnosis not present

## 2015-08-05 DIAGNOSIS — L820 Inflamed seborrheic keratosis: Secondary | ICD-10-CM | POA: Diagnosis not present

## 2015-08-05 DIAGNOSIS — Z85828 Personal history of other malignant neoplasm of skin: Secondary | ICD-10-CM | POA: Diagnosis not present

## 2015-08-05 DIAGNOSIS — X32XXXD Exposure to sunlight, subsequent encounter: Secondary | ICD-10-CM | POA: Diagnosis not present

## 2015-08-05 DIAGNOSIS — Z08 Encounter for follow-up examination after completed treatment for malignant neoplasm: Secondary | ICD-10-CM | POA: Diagnosis not present

## 2015-08-08 ENCOUNTER — Other Ambulatory Visit: Payer: Self-pay | Admitting: Family Medicine

## 2015-08-23 ENCOUNTER — Other Ambulatory Visit: Payer: Self-pay | Admitting: Family Medicine

## 2015-08-24 ENCOUNTER — Ambulatory Visit (INDEPENDENT_AMBULATORY_CARE_PROVIDER_SITE_OTHER): Payer: Medicare Other | Admitting: Family Medicine

## 2015-08-24 ENCOUNTER — Encounter: Payer: Self-pay | Admitting: Family Medicine

## 2015-08-24 VITALS — BP 155/70 | HR 85 | Temp 98.7°F | Ht 70.0 in | Wt 172.0 lb

## 2015-08-24 DIAGNOSIS — K5900 Constipation, unspecified: Secondary | ICD-10-CM

## 2015-08-24 DIAGNOSIS — E538 Deficiency of other specified B group vitamins: Secondary | ICD-10-CM

## 2015-08-24 DIAGNOSIS — E118 Type 2 diabetes mellitus with unspecified complications: Secondary | ICD-10-CM | POA: Diagnosis not present

## 2015-08-24 DIAGNOSIS — I10 Essential (primary) hypertension: Secondary | ICD-10-CM | POA: Diagnosis not present

## 2015-08-24 LAB — BASIC METABOLIC PANEL
BUN: 19 mg/dL (ref 6–23)
CO2: 28 mEq/L (ref 19–32)
CREATININE: 0.88 mg/dL (ref 0.40–1.50)
Calcium: 9.7 mg/dL (ref 8.4–10.5)
Chloride: 100 mEq/L (ref 96–112)
GFR: 87.09 mL/min (ref >60.00)
GLUCOSE: 222 mg/dL — AB (ref 70–99)
POTASSIUM: 6 meq/L — AB (ref 3.5–5.1)
Sodium: 137 mEq/L (ref 135–145)

## 2015-08-24 LAB — HEPATIC FUNCTION PANEL
ALBUMIN: 4.2 g/dL (ref 3.5–5.2)
ALT: 13 U/L (ref 0–53)
AST: 16 U/L (ref 0–37)
Alkaline Phosphatase: 83 U/L (ref 39–117)
Bilirubin, Direct: 0.1 mg/dL (ref 0.0–0.3)
Total Bilirubin: 0.4 mg/dL (ref 0.2–1.2)
Total Protein: 7.4 g/dL (ref 6.0–8.3)

## 2015-08-24 LAB — CBC WITH DIFFERENTIAL/PLATELET
BASOS ABS: 0 10*3/uL (ref 0.0–0.1)
Basophils Relative: 0.7 % (ref 0.0–3.0)
Eosinophils Absolute: 0.3 10*3/uL (ref 0.0–0.7)
Eosinophils Relative: 5.2 % — ABNORMAL HIGH (ref 0.0–5.0)
HCT: 33.3 % — ABNORMAL LOW (ref 39.0–52.0)
HEMOGLOBIN: 10.9 g/dL — AB (ref 13.0–17.0)
LYMPHS ABS: 1.3 10*3/uL (ref 0.7–4.0)
Lymphocytes Relative: 22.7 % (ref 12.0–46.0)
MCHC: 32.8 g/dL (ref 30.0–36.0)
MCV: 92 fl (ref 78.0–100.0)
MONO ABS: 0.5 10*3/uL (ref 0.1–1.0)
MONOS PCT: 9.1 % (ref 3.0–12.0)
NEUTROS PCT: 62.3 % (ref 43.0–77.0)
Neutro Abs: 3.5 10*3/uL (ref 1.4–7.7)
Platelets: 298 10*3/uL (ref 150.0–400.0)
RBC: 3.62 Mil/uL — AB (ref 4.22–5.81)
RDW: 13.3 % (ref 11.5–15.5)
WBC: 5.7 10*3/uL (ref 4.0–10.5)

## 2015-08-24 LAB — TSH: TSH: 2.73 u[IU]/mL (ref 0.35–4.50)

## 2015-08-24 LAB — HEMOGLOBIN A1C: HEMOGLOBIN A1C: 8.2 % — AB (ref 4.6–6.5)

## 2015-08-24 MED ORDER — FINASTERIDE 5 MG PO TABS: 5.0000 mg | ORAL_TABLET | Freq: Every day | ORAL | Status: DC

## 2015-08-24 MED ORDER — CYANOCOBALAMIN 1000 MCG/ML IJ SOLN
1000.0000 ug | Freq: Once | INTRAMUSCULAR | Status: AC
  Administered 2015-08-24: 1000 ug via INTRAMUSCULAR

## 2015-08-24 NOTE — Progress Notes (Signed)
Pre visit review using our clinic review tool, if applicable. No additional management support is needed unless otherwise documented below in the visit note. 

## 2015-08-24 NOTE — Progress Notes (Signed)
   Subjective:    Patient ID: Andrew Norton, male    DOB: 1927-05-05, 82 y.o.   MRN: [id number]  HPI Here for follow up on chronic constipation. He has been trying Amitiza for the last month along with Miralax, Milk of Magnesia, and Dulculax with poor results. The only way he has been able to have a BM is to use a Fleet enema. He is bloated and uncomfortable. No fever or nausea. We also reviewed his records from last September when he was hospitalized for aspiration pneumonia and sepsis. His liver enzymes were all elevated and it was presumed this was a side effect of his statin. He has not been taking Zocor since then.    Review of Systems  Constitutional: Negative.   Respiratory: Negative.   Cardiovascular: Negative.   Gastrointestinal: Positive for constipation and abdominal distention. Negative for nausea, vomiting, abdominal pain, diarrhea, blood in stool, anal bleeding and rectal pain.  Genitourinary: Negative.        Objective:   Physical Exam  Constitutional: He is oriented to person, place, and time. He appears well-developed and well-nourished.  Cardiovascular: Normal rate, regular rhythm, normal heart sounds and intact distal pulses.   Pulmonary/Chest: Effort normal and breath sounds normal. No respiratory distress. He has no wheezes. He has no rales.  Abdominal: Soft. Bowel sounds are normal. He exhibits no distension and no mass. There is no tenderness. There is no rebound and no guarding.  Musculoskeletal: He exhibits no edema.  Neurological: He is alert and oriented to person, place, and time.          Assessment & Plan:  For the constipation, we will refer him to GI to evaluate further. We will get labs today to follow up the liver enzymes, and I advised him to stay off Zocor from now on. I do not believe he needs a statin at this point.

## 2015-08-26 MED ORDER — SITAGLIPTIN PHOSPHATE 100 MG PO TABS: 100.0000 mg | ORAL_TABLET | Freq: Every day | ORAL | Status: DC

## 2015-08-26 NOTE — Addendum Note (Signed)
Addended by: Aggie Hacker A on: 08/26/2015 04:16 PM   Modules accepted: Orders

## 2015-08-30 ENCOUNTER — Telehealth: Payer: Self-pay | Admitting: Family Medicine

## 2015-08-30 NOTE — Telephone Encounter (Signed)
ERROR

## 2015-09-02 DIAGNOSIS — E119 Type 2 diabetes mellitus without complications: Secondary | ICD-10-CM | POA: Diagnosis not present

## 2015-09-02 DIAGNOSIS — K5900 Constipation, unspecified: Secondary | ICD-10-CM | POA: Diagnosis not present

## 2015-09-02 DIAGNOSIS — Z7984 Long term (current) use of oral hypoglycemic drugs: Secondary | ICD-10-CM | POA: Diagnosis not present

## 2015-09-05 ENCOUNTER — Other Ambulatory Visit: Payer: Self-pay | Admitting: Family Medicine

## 2015-09-20 DIAGNOSIS — H34232 Retinal artery branch occlusion, left eye: Secondary | ICD-10-CM | POA: Diagnosis not present

## 2015-09-20 DIAGNOSIS — H401122 Primary open-angle glaucoma, left eye, moderate stage: Secondary | ICD-10-CM | POA: Diagnosis not present

## 2015-09-21 DIAGNOSIS — L602 Onychogryphosis: Secondary | ICD-10-CM | POA: Diagnosis not present

## 2015-09-21 DIAGNOSIS — E1351 Other specified diabetes mellitus with diabetic peripheral angiopathy without gangrene: Secondary | ICD-10-CM | POA: Diagnosis not present

## 2015-09-21 DIAGNOSIS — R600 Localized edema: Secondary | ICD-10-CM | POA: Diagnosis not present

## 2015-09-21 DIAGNOSIS — I70293 Other atherosclerosis of native arteries of extremities, bilateral legs: Secondary | ICD-10-CM | POA: Diagnosis not present

## 2015-09-26 ENCOUNTER — Ambulatory Visit (INDEPENDENT_AMBULATORY_CARE_PROVIDER_SITE_OTHER): Payer: Medicare Other | Admitting: Family Medicine

## 2015-09-26 DIAGNOSIS — E538 Deficiency of other specified B group vitamins: Secondary | ICD-10-CM | POA: Diagnosis not present

## 2015-09-26 MED ORDER — CYANOCOBALAMIN 1000 MCG/ML IJ SOLN
1000.0000 ug | Freq: Once | INTRAMUSCULAR | Status: AC
  Administered 2015-09-26: 1000 ug via INTRAMUSCULAR

## 2015-09-29 DIAGNOSIS — H3562 Retinal hemorrhage, left eye: Secondary | ICD-10-CM | POA: Diagnosis not present

## 2015-09-29 DIAGNOSIS — H353122 Nonexudative age-related macular degeneration, left eye, intermediate dry stage: Secondary | ICD-10-CM | POA: Diagnosis not present

## 2015-09-29 DIAGNOSIS — H34832 Tributary (branch) retinal vein occlusion, left eye, with macular edema: Secondary | ICD-10-CM | POA: Diagnosis not present

## 2015-09-29 DIAGNOSIS — H43811 Vitreous degeneration, right eye: Secondary | ICD-10-CM | POA: Diagnosis not present

## 2015-10-05 ENCOUNTER — Ambulatory Visit (INDEPENDENT_AMBULATORY_CARE_PROVIDER_SITE_OTHER): Payer: Medicare Other | Admitting: Family Medicine

## 2015-10-05 ENCOUNTER — Encounter: Payer: Self-pay | Admitting: Family Medicine

## 2015-10-05 VITALS — BP 161/75 | HR 76 | Temp 97.5°F | Ht 70.0 in | Wt 171.0 lb

## 2015-10-05 DIAGNOSIS — D649 Anemia, unspecified: Secondary | ICD-10-CM | POA: Diagnosis not present

## 2015-10-05 DIAGNOSIS — N401 Enlarged prostate with lower urinary tract symptoms: Secondary | ICD-10-CM | POA: Diagnosis not present

## 2015-10-05 DIAGNOSIS — M25561 Pain in right knee: Secondary | ICD-10-CM | POA: Diagnosis not present

## 2015-10-05 DIAGNOSIS — E118 Type 2 diabetes mellitus with unspecified complications: Secondary | ICD-10-CM | POA: Diagnosis not present

## 2015-10-05 DIAGNOSIS — M5440 Lumbago with sciatica, unspecified side: Secondary | ICD-10-CM | POA: Diagnosis not present

## 2015-10-05 DIAGNOSIS — N138 Other obstructive and reflux uropathy: Secondary | ICD-10-CM

## 2015-10-05 DIAGNOSIS — I10 Essential (primary) hypertension: Secondary | ICD-10-CM

## 2015-10-05 DIAGNOSIS — G8929 Other chronic pain: Secondary | ICD-10-CM

## 2015-10-05 LAB — LIPID PANEL
CHOL/HDL RATIO: 4
Cholesterol: 152 mg/dL (ref 0–200)
HDL: 43 mg/dL (ref >39.00)
LDL Cholesterol: 72 mg/dL (ref 0–99)
NONHDL: 109.12
Triglycerides: 184 mg/dL — ABNORMAL HIGH (ref 0.0–149.0)
VLDL: 36.8 mg/dL (ref 0.0–40.0)

## 2015-10-05 LAB — PSA: PSA: 0.59 ng/mL (ref 0.10–4.00)

## 2015-10-05 NOTE — Progress Notes (Signed)
Pre visit review using our clinic review tool, if applicable. No additional management support is needed unless otherwise documented below in the visit note. 

## 2015-10-05 NOTE — Progress Notes (Signed)
   Subjective:    Patient ID: Andrew Norton, male    DOB: January 12, 1927, 82 y.o.   MRN: [id number]  HPI Here to follow up on several issues. He feels well in general but he does complain of constant pain in the right knee over the past few months. No recent trauma. No swelling. No locking or giving way. His hydrocodone gives him some relief. He recently had the sudden onset of blurred in the right eye and he saw Dr. 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 He is planning a series of  Injections soon to treat this. His BP has been stable. He is treating his constipation with Miralax TID and prn MOM.    Review of Systems  Constitutional: Negative.   HENT: Negative.   Eyes: Negative.   Respiratory: Negative.   Cardiovascular: Negative.   Gastrointestinal: Positive for constipation. Negative for nausea, vomiting, abdominal pain, diarrhea, blood in stool, abdominal distention, anal bleeding and rectal pain.  Genitourinary: Negative.   Musculoskeletal: Positive for back pain, arthralgias, gait problem, neck pain and neck stiffness. Negative for myalgias and joint swelling.  Skin: Negative.   Neurological: Negative.   Psychiatric/Behavioral: Negative.        Objective:   Physical Exam  Constitutional: He is oriented to person, place, and time. He appears well-developed and well-nourished. No distress.  Walks with a cane   HENT:  Head: Normocephalic and atraumatic.  Right Ear: External ear normal.  Left Ear: External ear normal.  Nose: Nose normal.  Mouth/Throat: Oropharynx is clear and moist. No oropharyngeal exudate.  Eyes: Conjunctivae and EOM are normal. Pupils are equal, round, and reactive to light. Right eye exhibits no discharge. Left eye exhibits no discharge. No scleral icterus.  Neck: Neck supple. No JVD present. No tracheal deviation present. No thyromegaly present.  Cardiovascular: Normal rate, regular rhythm, normal heart  sounds and intact distal pulses.  Exam reveals no gallop and no friction rub.   No murmur heard. Pulmonary/Chest: Effort normal and breath sounds normal. No respiratory distress. He has no wheezes. He has no rales. He exhibits no tenderness.  Abdominal: Soft. Bowel sounds are normal. He exhibits no distension and no mass. There is no tenderness. There is no rebound and no guarding.  Genitourinary: Rectum normal, prostate normal and penis normal. Guaiac negative stool. No penile tenderness.  Musculoskeletal: Normal range of motion. He exhibits no edema or tenderness.  Lymphadenopathy:    He has no cervical adenopathy.  Neurological: He is alert and oriented to person, place, and time. He has normal reflexes. No cranial nerve deficit. He exhibits normal muscle tone. Coordination normal.  Skin: Skin is warm and dry. No rash noted. He is not diaphoretic. No erythema. No pallor.  Psychiatric: He has a normal mood and affect. His behavior is normal. Judgment and thought content normal.          Assessment & Plan:  His HTN is stable. His neck and back pain are stable. His constipation is controlled. He will follow up with Dr, Glennon Mac about the retinal hemorrhage. Refer to Orthopedics for the knee pain, which is likely due to arthritis. Get fasting labs

## 2015-10-10 DIAGNOSIS — M25561 Pain in right knee: Secondary | ICD-10-CM | POA: Diagnosis not present

## 2015-10-11 DIAGNOSIS — K5900 Constipation, unspecified: Secondary | ICD-10-CM | POA: Diagnosis not present

## 2015-10-11 DIAGNOSIS — I10 Essential (primary) hypertension: Secondary | ICD-10-CM | POA: Diagnosis not present

## 2015-10-11 DIAGNOSIS — E139 Other specified diabetes mellitus without complications: Secondary | ICD-10-CM | POA: Diagnosis not present

## 2015-10-25 DIAGNOSIS — M1711 Unilateral primary osteoarthritis, right knee: Secondary | ICD-10-CM | POA: Diagnosis not present

## 2015-10-27 ENCOUNTER — Ambulatory Visit (INDEPENDENT_AMBULATORY_CARE_PROVIDER_SITE_OTHER): Payer: Medicare Other | Admitting: *Deleted

## 2015-10-27 DIAGNOSIS — E538 Deficiency of other specified B group vitamins: Secondary | ICD-10-CM | POA: Diagnosis not present

## 2015-10-27 MED ORDER — CYANOCOBALAMIN 1000 MCG/ML IJ SOLN
1000.0000 ug | Freq: Once | INTRAMUSCULAR | Status: AC
  Administered 2015-10-27: 1000 ug via INTRAMUSCULAR

## 2015-10-31 ENCOUNTER — Telehealth: Payer: Self-pay | Admitting: Family Medicine

## 2015-10-31 NOTE — Telephone Encounter (Signed)
Pt needs new rx hydrocodone °

## 2015-11-01 DIAGNOSIS — M1711 Unilateral primary osteoarthritis, right knee: Secondary | ICD-10-CM | POA: Diagnosis not present

## 2015-11-01 MED ORDER — HYDROCODONE-ACETAMINOPHEN 5-325 MG PO TABS: 1.0000 | ORAL_TABLET | Freq: Four times a day (QID) | ORAL | Status: DC | PRN

## 2015-11-01 NOTE — Telephone Encounter (Signed)
Script is ready for pick up and I left a voice message for pt. 

## 2015-11-01 NOTE — Telephone Encounter (Signed)
done

## 2015-11-03 ENCOUNTER — Other Ambulatory Visit: Payer: Self-pay | Admitting: Family Medicine

## 2015-11-03 DIAGNOSIS — M1711 Unilateral primary osteoarthritis, right knee: Secondary | ICD-10-CM | POA: Diagnosis not present

## 2015-11-04 DIAGNOSIS — H34832 Tributary (branch) retinal vein occlusion, left eye, with macular edema: Secondary | ICD-10-CM | POA: Diagnosis not present

## 2015-11-08 DIAGNOSIS — M1711 Unilateral primary osteoarthritis, right knee: Secondary | ICD-10-CM | POA: Diagnosis not present

## 2015-11-10 DIAGNOSIS — M1711 Unilateral primary osteoarthritis, right knee: Secondary | ICD-10-CM | POA: Diagnosis not present

## 2015-11-15 DIAGNOSIS — M1711 Unilateral primary osteoarthritis, right knee: Secondary | ICD-10-CM | POA: Diagnosis not present

## 2015-11-17 DIAGNOSIS — M1711 Unilateral primary osteoarthritis, right knee: Secondary | ICD-10-CM | POA: Diagnosis not present

## 2015-11-22 DIAGNOSIS — I70293 Other atherosclerosis of native arteries of extremities, bilateral legs: Secondary | ICD-10-CM | POA: Diagnosis not present

## 2015-11-22 DIAGNOSIS — L602 Onychogryphosis: Secondary | ICD-10-CM | POA: Diagnosis not present

## 2015-11-22 DIAGNOSIS — E1351 Other specified diabetes mellitus with diabetic peripheral angiopathy without gangrene: Secondary | ICD-10-CM | POA: Diagnosis not present

## 2015-11-22 DIAGNOSIS — M1711 Unilateral primary osteoarthritis, right knee: Secondary | ICD-10-CM | POA: Diagnosis not present

## 2015-11-22 DIAGNOSIS — L84 Corns and callosities: Secondary | ICD-10-CM | POA: Diagnosis not present

## 2015-11-24 DIAGNOSIS — M1711 Unilateral primary osteoarthritis, right knee: Secondary | ICD-10-CM | POA: Diagnosis not present

## 2015-11-25 ENCOUNTER — Ambulatory Visit (INDEPENDENT_AMBULATORY_CARE_PROVIDER_SITE_OTHER): Payer: Medicare Other | Admitting: Family Medicine

## 2015-11-25 DIAGNOSIS — E538 Deficiency of other specified B group vitamins: Secondary | ICD-10-CM

## 2015-11-25 MED ORDER — CYANOCOBALAMIN 1000 MCG/ML IJ SOLN
1000.0000 ug | Freq: Once | INTRAMUSCULAR | Status: AC
  Administered 2015-11-25: 1000 ug via INTRAMUSCULAR

## 2015-12-02 DIAGNOSIS — H34832 Tributary (branch) retinal vein occlusion, left eye, with macular edema: Secondary | ICD-10-CM | POA: Diagnosis not present

## 2015-12-02 DIAGNOSIS — H353132 Nonexudative age-related macular degeneration, bilateral, intermediate dry stage: Secondary | ICD-10-CM | POA: Diagnosis not present

## 2015-12-02 DIAGNOSIS — H43811 Vitreous degeneration, right eye: Secondary | ICD-10-CM | POA: Diagnosis not present

## 2015-12-26 ENCOUNTER — Encounter: Payer: Self-pay | Admitting: Adult Health

## 2015-12-26 ENCOUNTER — Ambulatory Visit (INDEPENDENT_AMBULATORY_CARE_PROVIDER_SITE_OTHER): Payer: Medicare Other | Admitting: Adult Health

## 2015-12-26 ENCOUNTER — Ambulatory Visit: Payer: Medicare Other | Admitting: Nurse Practitioner

## 2015-12-26 VITALS — BP 135/67 | HR 80 | Ht 70.0 in | Wt 171.2 lb

## 2015-12-26 DIAGNOSIS — G959 Disease of spinal cord, unspecified: Secondary | ICD-10-CM

## 2015-12-26 DIAGNOSIS — M792 Neuralgia and neuritis, unspecified: Secondary | ICD-10-CM

## 2015-12-26 DIAGNOSIS — R269 Unspecified abnormalities of gait and mobility: Secondary | ICD-10-CM | POA: Diagnosis not present

## 2015-12-26 NOTE — Progress Notes (Addendum)
PATIENT: Andrew Norton DOB: [birth year]  REASON FOR VISIT: follow up HISTORY FROM: patient  HISTORY OF PRESENT ILLNESS: Andrew Norton is a 82 year old male with a history of cervical myelopathy and abnormality of gait. He returns today for follow-up. The patient reports that he continues to have burning and tingling in the hands and feet. He states that gabapentin offers little benefit. He reports that this discomfort is continuous he feels that it is worse with activity. He states that he is sleeping okay. He uses a cane when ambulating. Denies any recent falls. The patient saw an ad in the newspaper for chiropractic therapy for neuropathy. He is interested to see if this will help his discomfort. The patient reports that he has used Aspercreme for his knee and he did offer him some benefit. The patient reports that he has ongoing right knee pain. He follow with his orthopedist who did an injection but no benefit. He returns today for an evaluation.  HISTORY (Dr. Krista Blue): Andrew Norton is 82 years old right-handed Caucasian male, accompanied by his wife, primary care physician is Dr. Alysia Penna, he was a previous patient of Dr. love, last clinical visit was August 13 2012 He had a past medical history of diabetes, shingles involving right C7 dermatome, mild memory loss, essential tremor  He had a history of acute on chronic cervical cord compression at decompression surgery October 2009, by neurosurgeon Dr. Vertell Limber, anterior cervical decompression, fusion at C3-4, C4-5, C5-6, with allograft, and anterior cervical plate, post surgically, he developed cardiac arrhythmia, myelopathy, with profound left arm, leg weakness, had slow recovery, Since the surgery, he continues to notice burning and numbness in both hands, left greater than right, mild gait difficulty,  He was treated with gabapentin 300 mg for his neuropathic pain involving left arm, and leg He also suffered bladder urgency, occasionally bladder spasm,  bowel incontinence, Post surgical MRI cervical January 2011 showed ACDF at C3, 4, C4-5, C5-6, interbody anterior plate and screw, hypodensity in the cord at C5-6, that is cystic, compatible with chronic myelomalacia, at C6 and 7, there is progression of central disc protrusion, disc osteophyte complex on the left, moderate large extruded disc fragment at T1-2, extending cranially. He had a repeat cervical MRI September 2011, which showed similar findings, but also questionable cord edema vs. myelomalacia at C4-5, He also complained of severe right lower extremity shooting pain, fell, and broken his left hip, had open reduction internal fixation 2011 by Dr.Allusio, He had worsening low back pain, CT myelogram December 2012 showed mild multifactorial spinal stenosis at L1-2, bilateral foraminal stenosis, mild multifactorial spinal stenosis at L3, moderate disc bulging at L4-5, with bilateral subarticular recess, and foraminal stenosis, right greater than left, compression deformity of L1 and 2. He eventually underwent lumbar decompression in April 2013 by Dr. Maxie Better, Today he complains almost constant and mild to moderate left hand and left leg neuropathic pain, despite Neurontin treatment, mild worsening gait difficulty, and he is taking hydrocodone 5/325 mg twice a day, receiving epidural injection by Dr. Nelva Bush UPDATE April 27th 2015: [name] continues to have gait difficulty, he has left hand and leg pain, as long as he is sitting, he relaxes well, if he walks a lot, he has pain in his left leg, going down to his left ankle and left foot He has bladder urgency. He has bladder accident occasionally, mild constipation, taking miralex daily. He has left 4th and 5th finger paresthesia, he is taking gabapentin 100 mg 3 tablets  4 times a day, Cymbalta 20 mg was added on in his last visit in January 2015, he did not notice any difference. We have reviewed MRI cervical together: bone fusion and metal hardware from  C3-C6 levels. Spinal cord atrophy and gliosis at C6 level.  C6-7: disc bulging, uncovertebral joint hypertrophy, and facet hypertrophy with moderate spinal stenosis and severe biforaminal foraminal stenosis.  C3-4, C4-5 and C5-6 there is severe biforaminal foraminal stenosis.  T1-2: disc protrusion with superior migration of disc material with slight deformation of spinal cord.  Compared to MRI on 10/26/10, there T1-2 disc protrusion has slightly increased. Otherwise no change. '[ UPDATE 12/24/14 Andrew Norton, 82 year old male returns for follow-up. He was last seen 11/09/2013 by Dr. Krista Blue. He continues to have mild gait difficulty left hand and left arm pain. He denies any bladder or bowel incontinence. He is using a single-point cane to ambulate. He has not had any recent falls. His gabapentin dose was increased at his last visit and his symptoms are well controlled. In addition he is on narcotic medication through Dr. Sarajane Jews. He has no new neurologic complaints. He says he is stable.  REVIEW OF SYSTEMS: Out of a complete 14 system review of symptoms, the patient complains only of the following symptoms, and all other reviewed systems are negative.  Hearing loss, read in ears, runny nose, loss of vision, blurred vision, environmental allergies  ALLERGIES: PCN  HOME MEDICATIONS: Outpatient Prescriptions Prior to Visit  Medication Sig Dispense Refill  . aspirin 81 MG chewable tablet Chew 1 tablet (81 mg total) by mouth daily. 1 tablet 0  . cyanocobalamin (,VITAMIN B-12,) 1000 MCG/ML injection Inject 1,000 mcg into the muscle every 30 (thirty) days.    . cyclobenzaprine (FLEXERIL) 10 MG tablet TAKE 1 TABLET 3 TIMES DAILYAS NEEDED FOR MUSCLE SPASMS (Patient taking differently: 10 mg at bedtime as needed for muscle spasms. ) 270 tablet 3  . Ferrous Sulfate 220 (44 FE) MG/5ML LIQD TAKE 7 MLS BY MOUTH DAILY 473 mL 3  . finasteride (PROSCAR) 5 MG tablet TAKE 1 TABLET DAILY 90 tablet 3  . gabapentin  (NEURONTIN) 400 MG capsule TAKE 1 CAPSULE 4 TIMES     DAILY 360 capsule 3  . glipiZIDE (GLUCOTROL) 10 MG tablet TAKE 1 TABLET TWICE A DAY  BEFORE MEALS 180 tablet 3  . HYDROcodone-acetaminophen (NORCO/VICODIN) 5-325 MG tablet Take 1 tablet by mouth every 6 (six) hours as needed for moderate pain. 120 tablet 0  . lidocaine (LIDODERM) 5 % Place 1 patch onto the skin daily. Remove & Discard patch within 12 hours or as directed by MD 90 patch 3  . losartan (COZAAR) 50 MG tablet TAKE 1 TABLET EVERY MORNING 90 tablet 3  . lubiprostone (AMITIZA) 24 MCG capsule Take 1 capsule (24 mcg total) by mouth 2 (two) times daily with a meal. 60 capsule 5  . metFORMIN (GLUCOPHAGE) 1000 MG tablet TAKE 1 TABLET TWICE A DAY  WITH MEALS 180 tablet 3  . polyethylene glycol powder (GLYCOLAX/MIRALAX) powder MIX 1 CAPFUL IN LIQUID OF CHOICE AND DRINK ONCE DAILY AS DIRECTED 527 g 1  . simvastatin (ZOCOR) 40 MG tablet TAKE 1 TABLET EVERY EVENING 90 tablet 3  . sitaGLIPtin (JANUVIA) 100 MG tablet Take 1 tablet (100 mg total) by mouth daily. 90 tablet 1  . finasteride (PROSCAR) 5 MG tablet Take 1 tablet (5 mg total) by mouth daily. 90 tablet 3   No facility-administered medications prior to visit.    PAST MEDICAL  HISTORY: Past Medical History  Diagnosis Date  . Anemia   . Diabetes mellitus   . Hyperlipidemia   . Renal insufficiency   . Paget's disease     scrotum  . Positive PPD, treated   . Hypertension     EKG, chest x ray 11/15/10 [facility],  clearance Dr Sharlene Motts on chart/ pt states on meds to "prevent hypertension from diabetes"  . Carpal tunnel syndrome     left leg sciatica  . Easy bruising   . Stroke [facility])     SPINAL CORD STROKE PER OV note 3/13  DR LOVE CLEARANCE NOTE_  ON CHART WITH RECOMMENDATIONS-   has weakness  upper extremities  . Arthritis     hips  . Hearing loss     left ear greater loss  . Cancer ([place])     skin can lesions, and non cancer skin lesions-follwed by Dr. Nevada Crane  . History of shingles      past hx. left hand    PAST SURGICAL HISTORY: Past Surgical History  Procedure Laterality Date  . Tonsillectomy    . Partial removal of scrotum    . Cervical fusion      dr Vertell Limber 2009- retained hardware  . Hip fracture surgery  11-24-10    left hip per Dr, [name]  . Colonoscopy  2-08    per Dr. Oletta Lamas, normal   . Eye surgery      bilateral cataract extraction with IOL  . Lumbar laminectomy/decompression microdiscectomy  11/07/2011    Procedure: LUMBAR LAMINECTOMY/DECOMPRESSION MICRODISCECTOMY;  Surgeon: Johnn Hai, MD;  Location: [facility];  Service: Orthopedics;  Laterality: N/A;  Decompression of the L4 - L5 Central (X-ray)   . Total hip revision Left 11/05/2012    Procedure: Conversion of a Bipolar to a Left Total Hip Arthroplasty;  Surgeon: [name], MD;  Location: [facility];  Service: Orthopedics;  Laterality: Left;  Conversion of a Bipolar to a Left Total Hip Arthroplasty    FAMILY HISTORY: Family History  Problem Relation Age of Onset  . Tuberculosis      SOCIAL HISTORY: Social History   Social History  . Marital Status: Married    Spouse Name: [name]  . Number of Children: 3  . Years of Education: College-BS   Occupational History  . retired    Social History Main Topics  . Smoking status: Never Smoker   . Smokeless tobacco: Never Used  . Alcohol Use: No  . Drug Use: No  . Sexual Activity: No   Other Topics Concern  . Not on file   Social History Narrative   Pt lives at home with wife [name])   Pt is right handed   Education college-BS   Pt states that he drinks 1 cup of coffee daily, and occ may have a cup of tea or soda      PHYSICAL EXAM  Filed Vitals:   12/26/15 1100  BP: 135/67  Pulse: 80  Height: 5\' 10"  (1.778 m)  Weight: 171 lb 3.2 oz (77.656 kg)   Body mass index is 24.56 kg/(m^2).  Generalized: Well developed, in no acute distress   Neurological examination  Mentation: Alert oriented to time, place, history taking.  Follows all commands speech and language fluent Cranial nerve II-XII: Pupils were equal round reactive to light. Extraocular movements were full, visual field were full on confrontational test. Facial sensation and strength were normal. Uvula tongue midline. Head turning and shoulder shrug  were normal  and symmetric. Motor: The motor testing reveals 5 over 5 strength of all 4 extremities. Good symmetric motor tone is noted throughout.  Sensory: Sensory testing is intact to soft touch on all 4 extremities. No evidence of extinction is noted.  Coordination: Cerebellar testing reveals good finger-nose-finger and heel-to-shin bilaterally.  Gait and station: Gait is normal. Tandem gait is normal. Romberg is negative. No drift is seen.  Reflexes: Deep tendon reflexes are symmetric and normal bilaterally.   DIAGNOSTIC DATA (LABS, IMAGING, TESTING) - I reviewed patient records, labs, notes, testing and imaging myself where available.  Lab Results  Component Value Date   WBC 5.7 08/24/2015   HGB 10.9* 08/24/2015   HCT 33.3* 08/24/2015   MCV 92.0 08/24/2015   PLT 298.0 08/24/2015      Component Value Date/Time   NA 137 08/24/2015 1229   K 6.0* 08/24/2015 1229   CL 100 08/24/2015 1229   CO2 28 08/24/2015 1229   GLUCOSE 222* 08/24/2015 1229   BUN 19 08/24/2015 1229   CREATININE 0.88 08/24/2015 1229   CALCIUM 9.7 08/24/2015 1229   PROT 7.4 08/24/2015 1229   ALBUMIN 4.2 08/24/2015 1229   AST 16 08/24/2015 1229   ALT 13 08/24/2015 1229   ALKPHOS 83 08/24/2015 1229   BILITOT 0.4 08/24/2015 1229   GFRNONAA >60 04/14/2015 0429   GFRAA >60 04/14/2015 0429   Lab Results  Component Value Date   CHOL 152 10/05/2015   HDL 43.00 10/05/2015   LDLCALC 72 10/05/2015   TRIG 184.0* 10/05/2015   CHOLHDL 4 10/05/2015   Lab Results  Component Value Date   HGBA1C 8.2* 08/24/2015    Lab Results  Component Value Date   TSH 2.73 08/24/2015      ASSESSMENT AND PLAN 82 y.o. year old male  has a  past medical history of Anemia; Diabetes mellitus; Hyperlipidemia; Renal insufficiency; Paget's disease; Positive PPD, treated; Hypertension; Carpal tunnel syndrome; Easy bruising; Stroke [facility]); Arthritis; Hearing loss; Cancer ([place]); and History of shingles. here with:  1. Cervical myelopathy   2. Abnormality of gait 3. Neuropathic pain  The patient will continue on gabapentin 400 mg 4 times a day. I did speak to the patient about using a compounded cream to control his discomfort in the hands and feet. He is interested in this. I will send a prescription for this. Patient verbalized understanding. He will follow-up in 6 months or sooner if needed.   Ward Givens, MSN, NP-C 12/26/2015, 11:05 AM [facility] [address], [place], [place] [address] [phone]  I reviewed the above note and documentation by the Nurse Practitioner and agree with the history, physical exam, assessment and plan as outlined above. I was immediately available for face-to-face consultation. Star Age, MD, PhD [facility])

## 2015-12-26 NOTE — Patient Instructions (Signed)
Continue gabapentin 400 mg four times a day  we will try compounded cream If your symptoms worsen or you develop new symptoms please let us know.

## 2015-12-28 ENCOUNTER — Ambulatory Visit (INDEPENDENT_AMBULATORY_CARE_PROVIDER_SITE_OTHER): Payer: Medicare Other | Admitting: Family Medicine

## 2015-12-28 DIAGNOSIS — E538 Deficiency of other specified B group vitamins: Secondary | ICD-10-CM | POA: Diagnosis not present

## 2015-12-28 MED ORDER — CYANOCOBALAMIN 1000 MCG/ML IJ SOLN
1000.0000 ug | Freq: Once | INTRAMUSCULAR | Status: AC
  Administered 2015-12-28: 1000 ug via INTRAMUSCULAR

## 2015-12-30 DIAGNOSIS — H34832 Tributary (branch) retinal vein occlusion, left eye, with macular edema: Secondary | ICD-10-CM | POA: Diagnosis not present

## 2015-12-30 DIAGNOSIS — H353132 Nonexudative age-related macular degeneration, bilateral, intermediate dry stage: Secondary | ICD-10-CM | POA: Diagnosis not present

## 2015-12-30 DIAGNOSIS — H43811 Vitreous degeneration, right eye: Secondary | ICD-10-CM | POA: Diagnosis not present

## 2016-01-03 ENCOUNTER — Telehealth: Payer: Self-pay | Admitting: *Deleted

## 2016-01-03 NOTE — Telephone Encounter (Signed)
Received new formula request form from [employer].  Pt requesting a substitute for the pain cream formula for insurance purposes.  Signed by MM/NP and faxed back.  Fax confirmation received.

## 2016-01-09 NOTE — Telephone Encounter (Addendum)
Received notification of alternative formula dispensed:  Meloxicam 0.5% / Orphenadrine 5% / Lidocaine 5% / and Doxepin Cream 5%.  [employer].  [phone], [phone] fax.

## 2016-01-09 NOTE — Addendum Note (Signed)
Addended byOliver Hum on: 01/09/2016 10:15 AM   Modules accepted: Medications

## 2016-01-17 ENCOUNTER — Other Ambulatory Visit: Payer: Self-pay | Admitting: Family Medicine

## 2016-01-24 ENCOUNTER — Ambulatory Visit (INDEPENDENT_AMBULATORY_CARE_PROVIDER_SITE_OTHER): Payer: Medicare Other | Admitting: *Deleted

## 2016-01-24 DIAGNOSIS — E538 Deficiency of other specified B group vitamins: Secondary | ICD-10-CM

## 2016-01-24 MED ORDER — CYANOCOBALAMIN 1000 MCG/ML IJ SOLN
1000.0000 ug | Freq: Once | INTRAMUSCULAR | Status: AC
  Administered 2016-01-24: 1000 ug via INTRAMUSCULAR

## 2016-01-31 ENCOUNTER — Telehealth: Payer: Self-pay | Admitting: Family Medicine

## 2016-01-31 MED ORDER — HYDROCODONE-ACETAMINOPHEN 5-325 MG PO TABS: 1.0000 | ORAL_TABLET | Freq: Four times a day (QID) | ORAL | Status: DC | PRN

## 2016-01-31 NOTE — Telephone Encounter (Signed)
Ready to pick up.  

## 2016-01-31 NOTE — Telephone Encounter (Signed)
Patient notified Rx is ready to be picked up.

## 2016-02-01 DIAGNOSIS — L602 Onychogryphosis: Secondary | ICD-10-CM | POA: Diagnosis not present

## 2016-02-01 DIAGNOSIS — I70293 Other atherosclerosis of native arteries of extremities, bilateral legs: Secondary | ICD-10-CM | POA: Diagnosis not present

## 2016-02-01 DIAGNOSIS — E1351 Other specified diabetes mellitus with diabetic peripheral angiopathy without gangrene: Secondary | ICD-10-CM | POA: Diagnosis not present

## 2016-02-03 DIAGNOSIS — H43811 Vitreous degeneration, right eye: Secondary | ICD-10-CM | POA: Diagnosis not present

## 2016-02-03 DIAGNOSIS — H353132 Nonexudative age-related macular degeneration, bilateral, intermediate dry stage: Secondary | ICD-10-CM | POA: Diagnosis not present

## 2016-02-03 DIAGNOSIS — H3562 Retinal hemorrhage, left eye: Secondary | ICD-10-CM | POA: Diagnosis not present

## 2016-02-03 DIAGNOSIS — H34832 Tributary (branch) retinal vein occlusion, left eye, with macular edema: Secondary | ICD-10-CM | POA: Diagnosis not present

## 2016-02-23 ENCOUNTER — Ambulatory Visit (INDEPENDENT_AMBULATORY_CARE_PROVIDER_SITE_OTHER): Payer: Medicare Other | Admitting: Family Medicine

## 2016-02-23 DIAGNOSIS — E538 Deficiency of other specified B group vitamins: Secondary | ICD-10-CM | POA: Diagnosis not present

## 2016-02-23 MED ORDER — CYANOCOBALAMIN 1000 MCG/ML IJ SOLN
1000.0000 ug | Freq: Once | INTRAMUSCULAR | Status: AC
  Administered 2016-02-23: 1000 ug via INTRAMUSCULAR

## 2016-03-05 DIAGNOSIS — H353132 Nonexudative age-related macular degeneration, bilateral, intermediate dry stage: Secondary | ICD-10-CM | POA: Diagnosis not present

## 2016-03-05 DIAGNOSIS — H3562 Retinal hemorrhage, left eye: Secondary | ICD-10-CM | POA: Diagnosis not present

## 2016-03-05 DIAGNOSIS — H43811 Vitreous degeneration, right eye: Secondary | ICD-10-CM | POA: Diagnosis not present

## 2016-03-05 DIAGNOSIS — H34832 Tributary (branch) retinal vein occlusion, left eye, with macular edema: Secondary | ICD-10-CM | POA: Diagnosis not present

## 2016-03-07 ENCOUNTER — Encounter: Payer: Self-pay | Admitting: Family Medicine

## 2016-03-07 ENCOUNTER — Ambulatory Visit (INDEPENDENT_AMBULATORY_CARE_PROVIDER_SITE_OTHER): Payer: Medicare Other | Admitting: Family Medicine

## 2016-03-07 VITALS — BP 148/67 | HR 128 | Temp 98.2°F | Ht 70.0 in | Wt 170.0 lb

## 2016-03-07 DIAGNOSIS — G8929 Other chronic pain: Secondary | ICD-10-CM

## 2016-03-07 DIAGNOSIS — E118 Type 2 diabetes mellitus with unspecified complications: Secondary | ICD-10-CM

## 2016-03-07 DIAGNOSIS — I10 Essential (primary) hypertension: Secondary | ICD-10-CM | POA: Diagnosis not present

## 2016-03-07 DIAGNOSIS — M542 Cervicalgia: Secondary | ICD-10-CM

## 2016-03-07 DIAGNOSIS — E119 Type 2 diabetes mellitus without complications: Secondary | ICD-10-CM | POA: Diagnosis not present

## 2016-03-07 DIAGNOSIS — M5440 Lumbago with sciatica, unspecified side: Secondary | ICD-10-CM

## 2016-03-07 LAB — HEMOGLOBIN A1C: Hgb A1c MFr Bld: 7.6 % — ABNORMAL HIGH (ref 4.6–6.5)

## 2016-03-07 NOTE — Progress Notes (Signed)
   Subjective:    Patient ID: Andrew Norton, male    DOB: 02/17/1927, 82 y.o.   MRN: [id number]  HPI Here to follow up on diabetes and HTN. He feels well except for knee pain. He plans to see Dr. Veverly Fells for this again soon. He has been taking Januvia along with his other meds since March. His BP is stable.    Review of Systems  Constitutional: Negative.   Respiratory: Negative.   Cardiovascular: Negative.   Musculoskeletal: Positive for arthralgias.       Objective:   Physical Exam  Constitutional: He is oriented to person, place, and time. He appears well-developed and well-nourished.  Cardiovascular: Normal rate, regular rhythm and intact distal pulses.   Pulmonary/Chest: Effort normal and breath sounds normal.  Neurological: He is alert and oriented to person, place, and time.          Assessment & Plan:  His HTN is  stable. We will get an A1c today to monitor the diabetes. He will see Dr. Veverly Fells about his knees.  Laurey Morale, MD

## 2016-03-07 NOTE — Progress Notes (Signed)
Pre visit review using our clinic review tool, if applicable. No additional management support is needed unless otherwise documented below in the visit note. 

## 2016-03-20 DIAGNOSIS — M25562 Pain in left knee: Secondary | ICD-10-CM | POA: Diagnosis not present

## 2016-03-20 DIAGNOSIS — M5441 Lumbago with sciatica, right side: Secondary | ICD-10-CM | POA: Diagnosis not present

## 2016-03-20 DIAGNOSIS — M25561 Pain in right knee: Secondary | ICD-10-CM | POA: Diagnosis not present

## 2016-03-28 DIAGNOSIS — M5441 Lumbago with sciatica, right side: Secondary | ICD-10-CM | POA: Diagnosis not present

## 2016-04-02 DIAGNOSIS — H43811 Vitreous degeneration, right eye: Secondary | ICD-10-CM | POA: Diagnosis not present

## 2016-04-02 DIAGNOSIS — H353132 Nonexudative age-related macular degeneration, bilateral, intermediate dry stage: Secondary | ICD-10-CM | POA: Diagnosis not present

## 2016-04-02 DIAGNOSIS — H34832 Tributary (branch) retinal vein occlusion, left eye, with macular edema: Secondary | ICD-10-CM | POA: Diagnosis not present

## 2016-04-02 DIAGNOSIS — H3562 Retinal hemorrhage, left eye: Secondary | ICD-10-CM | POA: Diagnosis not present

## 2016-04-03 ENCOUNTER — Ambulatory Visit (INDEPENDENT_AMBULATORY_CARE_PROVIDER_SITE_OTHER): Payer: Medicare Other | Admitting: Family Medicine

## 2016-04-03 DIAGNOSIS — E538 Deficiency of other specified B group vitamins: Secondary | ICD-10-CM

## 2016-04-03 DIAGNOSIS — Z23 Encounter for immunization: Secondary | ICD-10-CM

## 2016-04-03 MED ORDER — CYANOCOBALAMIN 1000 MCG/ML IJ SOLN
1000.0000 ug | Freq: Once | INTRAMUSCULAR | Status: AC
  Administered 2016-04-03: 1000 ug via INTRAMUSCULAR

## 2016-04-04 DIAGNOSIS — M5441 Lumbago with sciatica, right side: Secondary | ICD-10-CM | POA: Diagnosis not present

## 2016-04-05 DIAGNOSIS — I70293 Other atherosclerosis of native arteries of extremities, bilateral legs: Secondary | ICD-10-CM | POA: Diagnosis not present

## 2016-04-05 DIAGNOSIS — L602 Onychogryphosis: Secondary | ICD-10-CM | POA: Diagnosis not present

## 2016-04-05 DIAGNOSIS — E1351 Other specified diabetes mellitus with diabetic peripheral angiopathy without gangrene: Secondary | ICD-10-CM | POA: Diagnosis not present

## 2016-04-05 DIAGNOSIS — L84 Corns and callosities: Secondary | ICD-10-CM | POA: Diagnosis not present

## 2016-04-11 DIAGNOSIS — X32XXXD Exposure to sunlight, subsequent encounter: Secondary | ICD-10-CM | POA: Diagnosis not present

## 2016-04-11 DIAGNOSIS — L570 Actinic keratosis: Secondary | ICD-10-CM | POA: Diagnosis not present

## 2016-04-24 DIAGNOSIS — M5441 Lumbago with sciatica, right side: Secondary | ICD-10-CM | POA: Diagnosis not present

## 2016-04-26 ENCOUNTER — Telehealth: Payer: Self-pay | Admitting: Family Medicine

## 2016-04-26 ENCOUNTER — Ambulatory Visit (INDEPENDENT_AMBULATORY_CARE_PROVIDER_SITE_OTHER): Payer: Medicare Other | Admitting: *Deleted

## 2016-04-26 DIAGNOSIS — E538 Deficiency of other specified B group vitamins: Secondary | ICD-10-CM

## 2016-04-26 MED ORDER — CYANOCOBALAMIN 1000 MCG/ML IJ SOLN
1000.0000 ug | Freq: Once | INTRAMUSCULAR | Status: AC
  Administered 2016-04-26: 1000 ug via INTRAMUSCULAR

## 2016-04-26 MED ORDER — SITAGLIPTIN PHOSPHATE 100 MG PO TABS: 100.0000 mg | ORAL_TABLET | Freq: Every day | ORAL | 3 refills | Status: DC

## 2016-04-26 NOTE — Telephone Encounter (Signed)
done

## 2016-04-30 DIAGNOSIS — H34832 Tributary (branch) retinal vein occlusion, left eye, with macular edema: Secondary | ICD-10-CM | POA: Diagnosis not present

## 2016-04-30 DIAGNOSIS — H3562 Retinal hemorrhage, left eye: Secondary | ICD-10-CM | POA: Diagnosis not present

## 2016-04-30 DIAGNOSIS — H353132 Nonexudative age-related macular degeneration, bilateral, intermediate dry stage: Secondary | ICD-10-CM | POA: Diagnosis not present

## 2016-04-30 DIAGNOSIS — H43811 Vitreous degeneration, right eye: Secondary | ICD-10-CM | POA: Diagnosis not present

## 2016-05-09 ENCOUNTER — Encounter: Payer: Self-pay | Admitting: Family Medicine

## 2016-05-09 ENCOUNTER — Ambulatory Visit (INDEPENDENT_AMBULATORY_CARE_PROVIDER_SITE_OTHER): Payer: Medicare Other | Admitting: Family Medicine

## 2016-05-09 VITALS — BP 146/69 | HR 85 | Temp 97.4°F | Ht 70.0 in | Wt 161.0 lb

## 2016-05-09 DIAGNOSIS — R17 Unspecified jaundice: Secondary | ICD-10-CM

## 2016-05-09 NOTE — Progress Notes (Signed)
Pre visit review using our clinic review tool, if applicable. No additional management support is needed unless otherwise documented below in the visit note. 

## 2016-05-09 NOTE — Progress Notes (Signed)
   Subjective:    Patient ID: Andrew Norton, male    DOB: Dec 14, 1926, 82 y.o.   MRN: [id number]  HPI Here to complain of constipation and abdominal bloating. He has a hx of chronic constipation but this has been worse the past 2 weeks. He feels bloated and his appetite is poor. There is no abdominal pain or fever. He has 1 or 2 watery stools a day but he feels like he is backed up and cannot pass adequate amounts of stool. He is taking Miralax once or twice every day and also taking laxatives frequently. He had a normal colonoscopy in 2008.    Review of Systems  Constitutional: Positive for appetite change and unexpected weight change. Negative for chills, diaphoresis and fever.  Respiratory: Negative.   Cardiovascular: Negative.   Gastrointestinal: Positive for abdominal distention and constipation. Negative for abdominal pain, anal bleeding, blood in stool, diarrhea, nausea, rectal pain and vomiting.  Genitourinary: Negative.   Neurological: Negative.        Objective:   Physical Exam  Constitutional: He appears well-developed and well-nourished. No distress.  He is jaundiced especially in the face and conjunctivae   Eyes: Scleral icterus is present.  Neck: Neck supple. No thyromegaly present.  Cardiovascular: Normal rate, regular rhythm, normal heart sounds and intact distal pulses.   Pulmonary/Chest: Effort normal and breath sounds normal. No respiratory distress. He has no wheezes. He has no rales.  Abdominal: Soft. Bowel sounds are normal. He exhibits no distension and no mass. There is no tenderness. There is no rebound and no guarding.  Lymphadenopathy:    He has no cervical adenopathy.          Assessment & Plan:  Recent worsening of constipation and now with jaundice, worrisome for obstruction. Get labs today and set up for a CT of the abdomen and pelvis soon. Drink plenty of fluids.  Laurey Morale, MD

## 2016-05-10 ENCOUNTER — Other Ambulatory Visit: Payer: Medicare Other

## 2016-05-10 LAB — HEPATITIS PANEL, ACUTE
HCV AB: NEGATIVE
HEP A IGM: NONREACTIVE
HEP B C IGM: NONREACTIVE
HEP B S AG: NEGATIVE

## 2016-05-10 LAB — HEPATIC FUNCTION PANEL
ALBUMIN: 3.7 g/dL (ref 3.5–5.2)
ALT: 167 U/L — AB (ref 0–53)
AST: 95 U/L — AB (ref 0–37)
Alkaline Phosphatase: 583 U/L — ABNORMAL HIGH (ref 39–117)
Bilirubin, Direct: 1.8 mg/dL — ABNORMAL HIGH (ref 0.0–0.3)
Total Bilirubin: 3.3 mg/dL — ABNORMAL HIGH (ref 0.2–1.2)
Total Protein: 6.8 g/dL (ref 6.0–8.3)

## 2016-05-10 LAB — CBC WITH DIFFERENTIAL/PLATELET
Basophils Absolute: 0.1 10*3/uL (ref 0.0–0.1)
Basophils Relative: 1 % (ref 0.0–3.0)
EOS PCT: 1.3 % (ref 0.0–5.0)
Eosinophils Absolute: 0.1 10*3/uL (ref 0.0–0.7)
HEMATOCRIT: 28.8 % — AB (ref 39.0–52.0)
HEMOGLOBIN: 9.7 g/dL — AB (ref 13.0–17.0)
LYMPHS ABS: 2.4 10*3/uL (ref 0.7–4.0)
Lymphocytes Relative: 34.3 % (ref 12.0–46.0)
MCHC: 33.7 g/dL (ref 30.0–36.0)
MCV: 92.5 fl (ref 78.0–100.0)
MONOS PCT: 8 % (ref 3.0–12.0)
Monocytes Absolute: 0.6 10*3/uL (ref 0.1–1.0)
NEUTROS PCT: 55.4 % (ref 43.0–77.0)
Neutro Abs: 3.8 10*3/uL (ref 1.4–7.7)
Platelets: 297 10*3/uL (ref 150.0–400.0)
RBC: 3.12 Mil/uL — AB (ref 4.22–5.81)
RDW: 15.3 % (ref 11.5–15.5)
WBC: 6.9 10*3/uL (ref 4.0–10.5)

## 2016-05-10 LAB — BASIC METABOLIC PANEL WITH GFR
BUN: 25 mg/dL — ABNORMAL HIGH (ref 6–23)
CO2: 26 meq/L (ref 19–32)
Calcium: 9.4 mg/dL (ref 8.4–10.5)
Chloride: 100 meq/L (ref 96–112)
Creatinine, Ser: 0.98 mg/dL (ref 0.40–1.50)
GFR: 76.79 mL/min
Glucose, Bld: 178 mg/dL — ABNORMAL HIGH (ref 70–99)
Potassium: 6.4 meq/L (ref 3.5–5.1)
Sodium: 136 meq/L (ref 135–145)

## 2016-05-10 LAB — HEPATITIS C ANTIBODY: HCV Ab: NEGATIVE

## 2016-05-10 LAB — AMYLASE: Amylase: 40 U/L (ref 27–131)

## 2016-05-10 LAB — LIPASE: Lipase: 27 U/L (ref 11.0–59.0)

## 2016-05-11 ENCOUNTER — Ambulatory Visit
Admission: RE | Admit: 2016-05-11 | Discharge: 2016-05-11 | Disposition: A | Discharge status: Home / Self Care | Payer: Medicare Other | Source: Ambulatory Visit | Attending: Family Medicine | Admitting: Family Medicine

## 2016-05-11 ENCOUNTER — Other Ambulatory Visit: Payer: Medicare Other

## 2016-05-11 DIAGNOSIS — R1900 Intra-abdominal and pelvic swelling, mass and lump, unspecified site: Secondary | ICD-10-CM | POA: Diagnosis not present

## 2016-05-11 DIAGNOSIS — R17 Unspecified jaundice: Secondary | ICD-10-CM

## 2016-05-11 MED ORDER — IOPAMIDOL (ISOVUE-300) INJECTION 61%
100.0000 mL | Freq: Once | INTRAVENOUS | Status: AC | PRN
  Administered 2016-05-11: 100 mL via INTRAVENOUS

## 2016-05-14 ENCOUNTER — Telehealth: Payer: Self-pay | Admitting: Family Medicine

## 2016-05-14 NOTE — Telephone Encounter (Signed)
° °  Pt said he is going out of town and need to someone to call him back asap about his scan .

## 2016-05-14 NOTE — Addendum Note (Signed)
Addended by: Alysia Penna A on: 05/14/2016 10:19 PM   Modules accepted: Orders

## 2016-05-14 NOTE — Telephone Encounter (Signed)
I spoke with pt and went over results. 

## 2016-05-17 ENCOUNTER — Encounter (INDEPENDENT_AMBULATORY_CARE_PROVIDER_SITE_OTHER): Payer: Self-pay

## 2016-05-17 ENCOUNTER — Ambulatory Visit (INDEPENDENT_AMBULATORY_CARE_PROVIDER_SITE_OTHER): Payer: Medicare Other | Admitting: Physician Assistant

## 2016-05-17 ENCOUNTER — Encounter (HOSPITAL_COMMUNITY): Payer: Self-pay | Admitting: *Deleted

## 2016-05-17 ENCOUNTER — Other Ambulatory Visit (INDEPENDENT_AMBULATORY_CARE_PROVIDER_SITE_OTHER): Payer: Medicare Other

## 2016-05-17 ENCOUNTER — Encounter: Payer: Self-pay | Admitting: Physician Assistant

## 2016-05-17 ENCOUNTER — Inpatient Hospital Stay (HOSPITAL_COMMUNITY)
Admission: AD | Admit: 2016-05-17 | Discharge: 2016-05-30 | DRG: 408 | Disposition: A | Discharge status: Rehabilitation Facility | Payer: Medicare Other | Source: Ambulatory Visit | Attending: Family Medicine | Admitting: Family Medicine

## 2016-05-17 VITALS — BP 98/48 | HR 87 | Ht 70.0 in | Wt 158.0 lb

## 2016-05-17 DIAGNOSIS — Z950 Presence of cardiac pacemaker: Secondary | ICD-10-CM | POA: Diagnosis not present

## 2016-05-17 DIAGNOSIS — M889 Osteitis deformans of unspecified bone: Secondary | ICD-10-CM | POA: Diagnosis present

## 2016-05-17 DIAGNOSIS — Z9181 History of falling: Secondary | ICD-10-CM

## 2016-05-17 DIAGNOSIS — I480 Paroxysmal atrial fibrillation: Secondary | ICD-10-CM | POA: Diagnosis not present

## 2016-05-17 DIAGNOSIS — R17 Unspecified jaundice: Secondary | ICD-10-CM | POA: Diagnosis not present

## 2016-05-17 DIAGNOSIS — I472 Ventricular tachycardia: Secondary | ICD-10-CM | POA: Diagnosis not present

## 2016-05-17 DIAGNOSIS — Z8673 Personal history of transient ischemic attack (TIA), and cerebral infarction without residual deficits: Secondary | ICD-10-CM | POA: Diagnosis not present

## 2016-05-17 DIAGNOSIS — R938 Abnormal findings on diagnostic imaging of other specified body structures: Secondary | ICD-10-CM

## 2016-05-17 DIAGNOSIS — Z9842 Cataract extraction status, left eye: Secondary | ICD-10-CM

## 2016-05-17 DIAGNOSIS — K839 Disease of biliary tract, unspecified: Secondary | ICD-10-CM | POA: Diagnosis not present

## 2016-05-17 DIAGNOSIS — K5909 Other constipation: Secondary | ICD-10-CM | POA: Diagnosis present

## 2016-05-17 DIAGNOSIS — Z7982 Long term (current) use of aspirin: Secondary | ICD-10-CM | POA: Diagnosis not present

## 2016-05-17 DIAGNOSIS — E875 Hyperkalemia: Secondary | ICD-10-CM | POA: Diagnosis present

## 2016-05-17 DIAGNOSIS — K8071 Calculus of gallbladder and bile duct without cholecystitis with obstruction: Secondary | ICD-10-CM | POA: Diagnosis not present

## 2016-05-17 DIAGNOSIS — E860 Dehydration: Secondary | ICD-10-CM | POA: Diagnosis not present

## 2016-05-17 DIAGNOSIS — E119 Type 2 diabetes mellitus without complications: Secondary | ICD-10-CM | POA: Diagnosis not present

## 2016-05-17 DIAGNOSIS — K838 Other specified diseases of biliary tract: Secondary | ICD-10-CM | POA: Diagnosis not present

## 2016-05-17 DIAGNOSIS — R269 Unspecified abnormalities of gait and mobility: Secondary | ICD-10-CM | POA: Diagnosis present

## 2016-05-17 DIAGNOSIS — M6281 Muscle weakness (generalized): Secondary | ICD-10-CM | POA: Diagnosis not present

## 2016-05-17 DIAGNOSIS — I10 Essential (primary) hypertension: Secondary | ICD-10-CM | POA: Diagnosis not present

## 2016-05-17 DIAGNOSIS — R7989 Other specified abnormal findings of blood chemistry: Secondary | ICD-10-CM

## 2016-05-17 DIAGNOSIS — D72829 Elevated white blood cell count, unspecified: Secondary | ICD-10-CM

## 2016-05-17 DIAGNOSIS — E785 Hyperlipidemia, unspecified: Secondary | ICD-10-CM | POA: Diagnosis not present

## 2016-05-17 DIAGNOSIS — K8063 Calculus of gallbladder and bile duct with acute cholecystitis with obstruction: Secondary | ICD-10-CM | POA: Diagnosis not present

## 2016-05-17 DIAGNOSIS — Z9841 Cataract extraction status, right eye: Secondary | ICD-10-CM

## 2016-05-17 DIAGNOSIS — R634 Abnormal weight loss: Secondary | ICD-10-CM

## 2016-05-17 DIAGNOSIS — R296 Repeated falls: Secondary | ICD-10-CM | POA: Diagnosis present

## 2016-05-17 DIAGNOSIS — R9389 Abnormal findings on diagnostic imaging of other specified body structures: Secondary | ICD-10-CM

## 2016-05-17 DIAGNOSIS — Z981 Arthrodesis status: Secondary | ICD-10-CM

## 2016-05-17 DIAGNOSIS — R935 Abnormal findings on diagnostic imaging of other abdominal regions, including retroperitoneum: Secondary | ICD-10-CM | POA: Diagnosis not present

## 2016-05-17 DIAGNOSIS — I455 Other specified heart block: Secondary | ICD-10-CM | POA: Diagnosis not present

## 2016-05-17 DIAGNOSIS — I482 Chronic atrial fibrillation: Secondary | ICD-10-CM | POA: Diagnosis not present

## 2016-05-17 DIAGNOSIS — Z48815 Encounter for surgical aftercare following surgery on the digestive system: Secondary | ICD-10-CM | POA: Diagnosis not present

## 2016-05-17 DIAGNOSIS — E1142 Type 2 diabetes mellitus with diabetic polyneuropathy: Secondary | ICD-10-CM | POA: Diagnosis not present

## 2016-05-17 DIAGNOSIS — R945 Abnormal results of liver function studies: Secondary | ICD-10-CM

## 2016-05-17 DIAGNOSIS — Z794 Long term (current) use of insulin: Secondary | ICD-10-CM | POA: Diagnosis not present

## 2016-05-17 DIAGNOSIS — E784 Other hyperlipidemia: Secondary | ICD-10-CM | POA: Diagnosis not present

## 2016-05-17 DIAGNOSIS — Z7984 Long term (current) use of oral hypoglycemic drugs: Secondary | ICD-10-CM

## 2016-05-17 DIAGNOSIS — H9190 Unspecified hearing loss, unspecified ear: Secondary | ICD-10-CM | POA: Diagnosis present

## 2016-05-17 DIAGNOSIS — R54 Age-related physical debility: Secondary | ICD-10-CM | POA: Diagnosis present

## 2016-05-17 DIAGNOSIS — E11649 Type 2 diabetes mellitus with hypoglycemia without coma: Secondary | ICD-10-CM | POA: Diagnosis not present

## 2016-05-17 DIAGNOSIS — I495 Sick sinus syndrome: Secondary | ICD-10-CM | POA: Diagnosis not present

## 2016-05-17 DIAGNOSIS — E43 Unspecified severe protein-calorie malnutrition: Secondary | ICD-10-CM | POA: Diagnosis not present

## 2016-05-17 DIAGNOSIS — Z961 Presence of intraocular lens: Secondary | ICD-10-CM | POA: Diagnosis present

## 2016-05-17 DIAGNOSIS — I493 Ventricular premature depolarization: Secondary | ICD-10-CM | POA: Diagnosis not present

## 2016-05-17 DIAGNOSIS — I4891 Unspecified atrial fibrillation: Secondary | ICD-10-CM | POA: Diagnosis not present

## 2016-05-17 DIAGNOSIS — K5900 Constipation, unspecified: Secondary | ICD-10-CM | POA: Diagnosis not present

## 2016-05-17 DIAGNOSIS — K3189 Other diseases of stomach and duodenum: Secondary | ICD-10-CM

## 2016-05-17 DIAGNOSIS — Z96642 Presence of left artificial hip joint: Secondary | ICD-10-CM | POA: Diagnosis present

## 2016-05-17 DIAGNOSIS — D649 Anemia, unspecified: Secondary | ICD-10-CM | POA: Diagnosis present

## 2016-05-17 DIAGNOSIS — Z6822 Body mass index (BMI) 22.0-22.9, adult: Secondary | ICD-10-CM

## 2016-05-17 DIAGNOSIS — R278 Other lack of coordination: Secondary | ICD-10-CM | POA: Diagnosis not present

## 2016-05-17 DIAGNOSIS — Z95818 Presence of other cardiac implants and grafts: Secondary | ICD-10-CM

## 2016-05-17 DIAGNOSIS — E876 Hypokalemia: Secondary | ICD-10-CM | POA: Diagnosis not present

## 2016-05-17 DIAGNOSIS — K8020 Calculus of gallbladder without cholecystitis without obstruction: Secondary | ICD-10-CM | POA: Diagnosis not present

## 2016-05-17 DIAGNOSIS — K831 Obstruction of bile duct: Secondary | ICD-10-CM

## 2016-05-17 DIAGNOSIS — Z4659 Encounter for fitting and adjustment of other gastrointestinal appliance and device: Secondary | ICD-10-CM | POA: Diagnosis not present

## 2016-05-17 DIAGNOSIS — K8050 Calculus of bile duct without cholangitis or cholecystitis without obstruction: Secondary | ICD-10-CM | POA: Diagnosis not present

## 2016-05-17 DIAGNOSIS — N289 Disorder of kidney and ureter, unspecified: Secondary | ICD-10-CM | POA: Diagnosis present

## 2016-05-17 DIAGNOSIS — R932 Abnormal findings on diagnostic imaging of liver and biliary tract: Secondary | ICD-10-CM | POA: Diagnosis not present

## 2016-05-17 DIAGNOSIS — I4729 Other ventricular tachycardia: Secondary | ICD-10-CM

## 2016-05-17 DIAGNOSIS — R2689 Other abnormalities of gait and mobility: Secondary | ICD-10-CM | POA: Diagnosis not present

## 2016-05-17 HISTORY — DX: Unspecified atrial fibrillation: I48.91

## 2016-05-17 HISTORY — DX: Paroxysmal atrial fibrillation: I48.0

## 2016-05-17 LAB — CBC WITH DIFFERENTIAL/PLATELET
BASOS PCT: 0 % (ref 0.0–3.0)
Basophils Absolute: 0 10*3/uL (ref 0.0–0.1)
EOS ABS: 0 10*3/uL (ref 0.0–0.7)
Eosinophils Relative: 0.2 % (ref 0.0–5.0)
HCT: 28 % — ABNORMAL LOW (ref 39.0–52.0)
Hemoglobin: 9.4 g/dL — ABNORMAL LOW (ref 13.0–17.0)
Lymphocytes Relative: 7.5 % — ABNORMAL LOW (ref 12.0–46.0)
Lymphs Abs: 1.5 10*3/uL (ref 0.7–4.0)
MCHC: 33.6 g/dL (ref 30.0–36.0)
MCV: 92.6 fl (ref 78.0–100.0)
MONO ABS: 1.4 10*3/uL — AB (ref 0.1–1.0)
Monocytes Relative: 7.1 % (ref 3.0–12.0)
NEUTROS ABS: 17.2 10*3/uL — AB (ref 1.4–7.7)
Neutrophils Relative %: 85.2 % — ABNORMAL HIGH (ref 43.0–77.0)
PLATELETS: 348 10*3/uL (ref 150.0–400.0)
RBC: 3.02 Mil/uL — ABNORMAL LOW (ref 4.22–5.81)
RDW: 14.7 % (ref 11.5–15.5)
WBC: 20.2 10*3/uL (ref 4.0–10.5)

## 2016-05-17 LAB — COMPREHENSIVE METABOLIC PANEL
ALT: 155 U/L — ABNORMAL HIGH (ref 0–53)
AST: 179 U/L — AB (ref 0–37)
Albumin: 3.8 g/dL (ref 3.5–5.2)
Alkaline Phosphatase: 673 U/L — ABNORMAL HIGH (ref 39–117)
BUN: 25 mg/dL — ABNORMAL HIGH (ref 6–23)
CALCIUM: 9.5 mg/dL (ref 8.4–10.5)
CHLORIDE: 100 meq/L (ref 96–112)
CO2: 23 meq/L (ref 19–32)
CREATININE: 1.13 mg/dL (ref 0.40–1.50)
GFR: 65.15 mL/min (ref >60.00)
Glucose, Bld: 233 mg/dL — ABNORMAL HIGH (ref 70–99)
Potassium: 6.4 mEq/L (ref 3.5–5.1)
SODIUM: 134 meq/L — AB (ref 135–145)
Total Bilirubin: 5.2 mg/dL — ABNORMAL HIGH (ref 0.2–1.2)
Total Protein: 7 g/dL (ref 6.0–8.3)

## 2016-05-17 LAB — URINALYSIS, ROUTINE W REFLEX MICROSCOPIC
Glucose, UA: NEGATIVE mg/dL
HGB URINE DIPSTICK: NEGATIVE
Ketones, ur: NEGATIVE mg/dL
Nitrite: NEGATIVE
PH: 6 (ref 5.0–8.0)
Protein, ur: NEGATIVE mg/dL
SPECIFIC GRAVITY, URINE: 1.007 (ref 1.005–1.030)

## 2016-05-17 LAB — URINE MICROSCOPIC-ADD ON
RBC / HPF: NONE SEEN RBC/hpf (ref 0–5)
Squamous Epithelial / LPF: NONE SEEN

## 2016-05-17 LAB — GLUCOSE, CAPILLARY
GLUCOSE-CAPILLARY: 40 mg/dL — AB (ref 65–99)
GLUCOSE-CAPILLARY: 56 mg/dL — AB (ref 65–99)

## 2016-05-17 LAB — LACTIC ACID, PLASMA
Lactic Acid, Venous: 3 mmol/L (ref 0.5–1.9)
Lactic Acid, Venous: 1.7 mmol/L (ref 0.5–1.9)

## 2016-05-17 LAB — PROTIME-INR
INR: 1.2 ratio — ABNORMAL HIGH (ref 0.8–1.0)
PROTHROMBIN TIME: 12.7 s (ref 9.6–13.1)

## 2016-05-17 LAB — PROCALCITONIN: Procalcitonin: 10.78 ng/mL

## 2016-05-17 MED ORDER — SODIUM POLYSTYRENE SULFONATE 15 GM/60ML PO SUSP
30.0000 g | Freq: Once | ORAL | Status: AC
  Administered 2016-05-17: 30 g via ORAL
  Filled 2016-05-17: qty 120

## 2016-05-17 MED ORDER — CYCLOBENZAPRINE HCL 10 MG PO TABS: 10.0000 mg | ORAL_TABLET | Freq: Every evening | ORAL | Status: DC | PRN

## 2016-05-17 MED ORDER — SODIUM CHLORIDE 0.9 % IV BOLUS (SEPSIS)
1000.0000 mL | Freq: Once | INTRAVENOUS | Status: AC
Start: 2016-05-17 — End: 2016-05-17
  Administered 2016-05-17: 1000 mL via INTRAVENOUS

## 2016-05-17 MED ORDER — HYDRALAZINE HCL 20 MG/ML IJ SOLN: 10.0000 mg | Freq: Four times a day (QID) | INTRAMUSCULAR | Status: DC | PRN

## 2016-05-17 MED ORDER — INSULIN ASPART 100 UNIT/ML ~~LOC~~ SOLN
5.0000 [IU] | Freq: Once | SUBCUTANEOUS | Status: AC
Start: 2016-05-17 — End: 2016-05-17
  Administered 2016-05-17: 5 [IU] via SUBCUTANEOUS

## 2016-05-17 MED ORDER — LIDOCAINE 5 % EX PTCH
1.0000 | MEDICATED_PATCH | CUTANEOUS | Status: DC
  Administered 2016-05-18 – 2016-05-30 (×10): 1 via TRANSDERMAL
  Filled 2016-05-17 (×15): qty 1

## 2016-05-17 MED ORDER — METRONIDAZOLE IN NACL 5-0.79 MG/ML-% IV SOLN
500.0000 mg | Freq: Three times a day (TID) | INTRAVENOUS | Status: DC
  Administered 2016-05-17 – 2016-05-19 (×5): 500 mg via INTRAVENOUS
  Filled 2016-05-17 (×5): qty 100

## 2016-05-17 MED ORDER — HYDROCODONE-ACETAMINOPHEN 5-325 MG PO TABS
1.0000 | ORAL_TABLET | Freq: Four times a day (QID) | ORAL | Status: DC | PRN
  Administered 2016-05-18: 1 via ORAL
  Filled 2016-05-17: qty 1

## 2016-05-17 MED ORDER — SODIUM CHLORIDE 0.9 % IV SOLN
INTRAVENOUS | Status: AC
  Administered 2016-05-17 – 2016-05-18 (×3): via INTRAVENOUS

## 2016-05-17 MED ORDER — CIPROFLOXACIN IN D5W 400 MG/200ML IV SOLN
400.0000 mg | Freq: Two times a day (BID) | INTRAVENOUS | Status: DC
  Administered 2016-05-17 – 2016-05-19 (×4): 400 mg via INTRAVENOUS
  Filled 2016-05-17 (×4): qty 200

## 2016-05-17 MED ORDER — SODIUM CHLORIDE 0.9% FLUSH
3.0000 mL | Freq: Two times a day (BID) | INTRAVENOUS | Status: DC
  Administered 2016-05-19 – 2016-05-29 (×13): 3 mL via INTRAVENOUS

## 2016-05-17 MED ORDER — SENNOSIDES-DOCUSATE SODIUM 8.6-50 MG PO TABS
1.0000 | ORAL_TABLET | Freq: Two times a day (BID) | ORAL | Status: DC
  Administered 2016-05-18 – 2016-05-29 (×18): 1 via ORAL
  Filled 2016-05-17 (×22): qty 1

## 2016-05-17 MED ORDER — FINASTERIDE 5 MG PO TABS
5.0000 mg | ORAL_TABLET | Freq: Every day | ORAL | Status: DC
  Administered 2016-05-17 – 2016-05-30 (×13): 5 mg via ORAL
  Filled 2016-05-17 (×13): qty 1

## 2016-05-17 MED ORDER — INSULIN ASPART 100 UNIT/ML ~~LOC~~ SOLN
0.0000 [IU] | Freq: Three times a day (TID) | SUBCUTANEOUS | Status: DC
  Administered 2016-05-18: 2 [IU] via SUBCUTANEOUS
  Administered 2016-05-19: 5 [IU] via SUBCUTANEOUS
  Administered 2016-05-19 (×3): 2 [IU] via SUBCUTANEOUS
  Administered 2016-05-20: 3 [IU] via SUBCUTANEOUS
  Administered 2016-05-20 – 2016-05-21 (×3): 2 [IU] via SUBCUTANEOUS
  Administered 2016-05-21: 9 [IU] via SUBCUTANEOUS
  Administered 2016-05-21 – 2016-05-22 (×2): 3 [IU] via SUBCUTANEOUS

## 2016-05-17 NOTE — Patient Instructions (Addendum)
Please go to the basement level to have your labs drawn.  You have been scheduled for an ERCP with biopsy. Please follow written instructions given to you at your visit today. If you use inhalers (even only as needed), please bring them with you on the day of your procedure. Your physician has requested that you go to [email or web address] and enter the access code given to you at your visit today. This web site gives a general overview about your procedure. However, you should still follow specific instructions given to you by our office regarding your preparation for the procedure.

## 2016-05-17 NOTE — Progress Notes (Cosign Needed)
Subjective:    Patient ID: Andrew Norton, male    DOB: 29-Dec-1926, 82 y.o.   MRN: [id number]  HPI [name] is a very nice 82 year old white male, new to GI today referred by Dr. Sarajane Jews for evaluation of new onset of jaundice and abnormal CT scan. Patient says that he has not been feeling well over the past couple of weeks and his wife first notice some mild jaundice about 3 weeks ago. His appetite has not been good over the past couple of months and he has been eating less and states that he feels up quicker. Is not had any nausea or vomiting. His weight has dropped from 170 to baseline to 158 today. No documented fever or chills. He has had problems with constipation. He had previously been known to Dr. Oletta Lamas for colonoscopies. Other medical problems include adult-onset diabetes mellitus, B12 deficiency and neuropathy. Patient had CT of the abdomen and pelvis done on 05/11/2016 after recent labs showed an elevated T bili at 3.3. CT scan showed intrahepatic ductal dilation, no gallstones, common bile duct dilated to 1.8 cm with abrupt narrowing of the common bile duct at the ampulla. 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 He also has a fatty replaced pancreas. Labs done on 05/09/2016 total bili of 3.3 alkaline phosphatase 583 AST 95 ALT of 167 WBC of 6.9 hemoglobin 9.7, potassium was 6.4 BUN 25 creatinine of 0.98. Reviewing his previous labs acid was elevated at 6, back in February 2017 and he has had mildly elevated LFTs over the past year.  Review of Systems Pertinent positive and negative review of systems were noted in the above HPI section.  All other review of systems was otherwise negative.  Outpatient Encounter Prescriptions as of 05/17/2016  Medication Sig  . aspirin 81 MG chewable tablet Chew 1 tablet (81 mg total) by mouth daily.  . cyanocobalamin (,VITAMIN B-12,) 1000 MCG/ML injection Inject 1,000 mcg into the muscle every 30 (thirty) days.  .  cyclobenzaprine (FLEXERIL) 10 MG tablet TAKE 1 TABLET 3 TIMES DAILYAS NEEDED FOR MUSCLE SPASMS (Patient taking differently: 10 mg at bedtime as needed for muscle spasms. )  . Ferrous Sulfate 220 (44 FE) MG/5ML LIQD TAKE 7 MLS BY MOUTH DAILY  . finasteride (PROSCAR) 5 MG tablet TAKE 1 TABLET DAILY  . gabapentin (NEURONTIN) 400 MG capsule TAKE 1 CAPSULE 4 TIMES     DAILY  . glipiZIDE (GLUCOTROL) 10 MG tablet TAKE 1 TABLET TWICE A DAY  BEFORE MEALS  . HYDROcodone-acetaminophen (NORCO/VICODIN) 5-325 MG tablet Take 1 tablet by mouth every 6 (six) hours as needed for moderate pain.  Marland Kitchen lidocaine (LIDODERM) 5 % Place 1 patch onto the skin daily. Remove & Discard patch within 12 hours or as directed by MD  . losartan (COZAAR) 50 MG tablet TAKE 1 TABLET EVERY MORNING  . lubiprostone (AMITIZA) 24 MCG capsule Take 1 capsule (24 mcg total) by mouth 2 (two) times daily with a meal. (Patient not taking: Reported on 05/09/2016)  . metFORMIN (GLUCOPHAGE) 1000 MG tablet TAKE 1 TABLET TWICE A DAY  WITH MEALS  . polyethylene glycol powder (GLYCOLAX/MIRALAX) powder MIX 1 CAPFUL IN LIQUID OF CHOICE AND DRINK ONCE DAILY AS DIRECTED  . simvastatin (ZOCOR) 40 MG tablet TAKE 1 TABLET EVERY EVENING (Patient not taking: Reported on 05/09/2016)  . sitaGLIPtin (JANUVIA) 100 MG tablet Take 1 tablet (100 mg total) by mouth daily.   No facility-administered encounter medications on file as  of 05/17/2016.    Allergies  Allergen Reactions  . Penicillins Hives       Patient Active Problem List   Diagnosis Date Noted  . Tachy-brady syndrome ([place]) 05/21/2016  . Sinus pause 05/21/2016  . NSVT (nonsustained ventricular tachycardia) ([place]) 05/21/2016  . Bile duct obstruction   . Abnormal CT of the abdomen   . Paroxysmal atrial fibrillation ([place]) 05/18/2016  . Protein-calorie malnutrition, severe 05/18/2016  . Jaundice 05/17/2016  . Diabetes mellitus type 2, noninsulin dependent ([place]) 05/17/2016  . LFT elevation 05/17/2016  .  Leukocytosis 05/17/2016  . 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 Constipation 07/18/2015  . Aspiration pneumonia ([place]) 04/12/2015  . Sepsis ([place]) 04/12/2015  . Hyperlipidemia 04/12/2015  . Diabetes mellitus type 2 with complications ([place]) 123456  . Lobar pneumonia due to unspecified organism 04/12/2015  . Cervical myelopathy ([place]) 11/10/2013  . Cervical disc disorder with radiculopathy of cervical region 07/01/2013  . Abnormality of gait 07/01/2013  . Unspecified arthropathy, pelvic region and thigh 11/27/2012  . Type II or unspecified type diabetes mellitus with neurological manifestations, not stated as uncontrolled(250.60) 11/27/2012  . Unspecified hereditary and idiopathic peripheral neuropathy 11/27/2012  . Acute posthemorrhagic anemia 11/27/2012  . Pain with hip hemiarthroplasty ([place]) 11/05/2012  . HYPERKALEMIA 10/26/2009  . Vitamin B12 deficiency 09/27/2009  . CARPAL TUNNEL SYNDROME 09/27/2009  . 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 CHEST PAIN 12/01/2008  . NECK PAIN, CHRONIC 07/01/2008  . METHYLMALONIC ACIDEMIA 02/09/2008  . ANXIETY STATE, UNSPECIFIED 12/29/2007  . LEG CRAMPS, NOCTURNAL 12/29/2007  . SHINGLES 12/15/2007  . HEARING LOSS 08/11/2007  . DYSFUNCTION OF EUSTACHIAN TUBE 08/04/2007  . BACK PAIN, LUMBAR 07/30/2007  . Essential hypertension 07/21/2007  . ACUTE BRONCHITIS 07/21/2007  . HYPERLIPIDEMIA 05/12/2007  . Disorder resulting from impaired renal function 05/12/2007  . ANEMIA-NOS 12/11/2006  . HAY FEVER 12/11/2006  . PAGET'S DISEASE 12/11/2006  . TB SKIN TEST, POSITIVE 12/11/2006   Social History   Social History  . Marital status: Married    Spouse name: [name]  . Number of children: 3  . Years of education: College-BS   Occupational History  . retired Retired   Social History Main Topics  . Smoking status: Never Smoker  . Smokeless tobacco: Never Used  . Alcohol use No  . Drug use: No  . Sexual activity: No   Other Topics Concern    . Not on file   Social History Narrative   Pt lives at home with wife [name])   Pt is right handed   Education college-BS   Pt states that he drinks 1 cup of coffee daily, and occ may have a cup of tea or soda    Mr. Duecker's family history is not on file.      Objective:    Vitals:   05/17/16 1116  BP: (!) 98/48  Pulse: 87    Physical Exam  well-developed elderly white male in no acute distress, accompanied by his wife, he is jaundiced, blood pressure 98/48 pulse 87, Weight 158 down from baseline of 172 BMI 22.6. HEENT; nontraumatic normocephalic PERRLA sclerae are mildly icteric, Cardiovascular; regular rate and rhythm with S1-S2 no murmur rub or gallop, Pulmonary ;clear bilaterally, Abdomen ;no focal tenderness no palpable mass or hepatosplenomegaly no guarding or rebound bowel sounds are present, Rectal; exam not done, Extremities; no clubbing cyanosis or edema skin warm and dry, Neuropsych; mood and affect appropriate       Assessment & Plan:   #50 82 year old  white male with new onset of jaundice, poor appetite, weight loss and abnormal CT scan done one week ago showing a dilated common bile duct, intrahepatic ductal dilation and abrupt narrowing of the common bile duct at the ampulla where there is a polypoid lesion 0.5 cm, 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 #2  adult-onset diabetes mellitus #3 peripheral neuropathy #4 history of B12 deficiency #5 hypertension chronic constipation #6 hyperkalemia   Plan; Will repeat CBC with differential, and CMET  today check coags Long discussion with the patient and his wife today regarding findings and indication for further workup. Patient will be scheduled for ERCP, biopsy and stent with Dr. Ardis Hughs next week on Wednesday, 05/23/2016. Procedure was discussed in detail with the patient and his wife and they are agreeable to proceed. Also  discussed with Dr. Ardis Hughs by phone. He was  Cautioned  should he have any worsening of his symptoms in  the interim or develops fever he should call for further advice.    Addendum- labs returned this afternoon showing WBC up to 20.2 with 85% neutrophils, hemoglobin 9.4/hematocrit 28. Total bilirubin 5.2, alkaline phosphatase 673, AST 179, ALT 155, BUN 25, creatinine 1.13 and potassium 6.4 I have discussed with Dr. Fuller Plan and patient will be admitted this afternoon to the hospitalist service. I have also communicated with Dr. Maryland Pink who has accepted him. Patient will need hydrated, stopped Cozaar, start IV antibiotics (patient allergic to penicillin), blood cultures. Correct hyperkalemia Plan will be to get him scheduled for ERCP, stent and biopsy if Dr. Fuller Plan tomorrow 05/18/2016. I have spoken to the patient by phone, he voices understanding and will go to  [facility]  for admission.  Aliyana Dlugosz Genia Harold PA-C 05/17/2016   Cc: Laurey Morale, MD

## 2016-05-17 NOTE — Progress Notes (Signed)
Called by [facility]. 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 Initial plan was for scheduled ERCP.  Patient seen in GI office today had repeat blood work as compared to a week ago. Potassium noted to still be elevated at 6.4, slight increase in creatinine and LFTs and alkaline phosphatase trending upward. Also, bilirubin had increased from 3.3 last week to 5.2. With these findings, it was felt best that patient come in as directed admission, her potassium stabilized and if so, hopefully can get ERCP tomorrow.  Unclear etiology for potassium levels.? Secondary to ARB/Ace?  Requested telemetry bed for hyperkalemia.

## 2016-05-17 NOTE — Progress Notes (Signed)
CRITICAL VALUE ALERT  Critical value received:  Lactic acid  Date of notification:  05/17/16  Time of notification:  1915  Critical value read back:Yes.    Nurse who received alert:  Cordella Register, RN  MD notified (1st page):  Baltazar Najjar  Time of first page:  1917  MD notified (2nd page):  Time of second page:  Responding MD:    Time MD responded:

## 2016-05-17 NOTE — Progress Notes (Signed)
Pt has arrived as direct admit to 1431. I have been informed that Dr. Erlinda Hong will be covering this pt and she has been paged.  Oriented pt and family to room, phone, and call light.  Awaiting further orders.  Will keep NPO for now pending orders.

## 2016-05-17 NOTE — H&P (Signed)
History and Physical  Andrew Norton [id number] DOB: 01-14-1927 DOA: 05/17/2016  Referring physician: EDP PCP: Laurey Morale, MD   Chief Complaint: direct admission for [facility] due to jaundice, lft elevation, hyperkalemia  HPI: Andrew Norton is a 82 y.o. male  With h/o HTN, noninsulin dependent dm2, chronic constipation, b12 deficiency, neuropathy with gait instability is sent from [facility] for direct admission. Patient is referred to [facility] by pmd due to new onset of jaundice and abnormal CT ab/pel scan which showed "intrahepatic biliary ductal dilatation. No focal hepatic mass. No calcified gallstones are noted within gallbladder. There is significant CBD dilatation up to 1.8 cm. There is abrupt narrowing of CBD at the level of ampulla There is a polypoid lesion at the level of ampulla within duodenum measures about 1.5 cm axial image 47. Ampullary mass cannot be excluded. Further correlation with ERCP is recommended."  Labs done at [facility] office today showed leukocytosis wbc of 20.2, hyperkalemia, k 6.4, bun 25, cr 1.13, elevated lft which is worsening. hospitalist called to direct admit the patient.   Patient says that he has not been feeling well over the past couple of weeks and his wife first notice some mild jaundice about 3 weeks ago. He has decreased appetite, no abdominal pain, no nausea or vomiting. He report decreased urination in the last three weeks. His weight has dropped from 170 to baseline to 158 today. He denies fever or chills, no cough, no chest pain. He walks with a cane during day time and a walker at night time due to chronic gait instability.    Review of Systems:  Detail per HPI, Review of systems are otherwise negative  Past Medical History:  Diagnosis Date  . Anemia   . Arthritis    hips  . Cancer ([place])    skin can lesions, and non cancer skin lesions-follwed by Dr. Nevada Crane  . Carpal tunnel syndrome    left leg sciatica  . Diabetes mellitus   . Easy bruising   .  Hearing loss    left ear greater loss  . History of shingles    past hx. left hand  . Hyperlipidemia   . Hypertension    EKG, chest x ray 11/15/10 [facility],  clearance Dr Sharlene Motts on chart/ pt states on meds to "prevent hypertension from diabetes"  . Paget's disease    scrotum  . Positive PPD, treated   . Renal insufficiency   . Stroke [facility])    SPINAL CORD STROKE PER OV note 3/13  DR LOVE CLEARANCE NOTE_  ON CHART WITH RECOMMENDATIONS-   has weakness  upper extremities   Past Surgical History:  Procedure Laterality Date  . CERVICAL FUSION     dr Vertell Limber 2009- retained hardware  . colonoscopy  2-08   per Dr. Oletta Lamas, normal   . EYE SURGERY     bilateral cataract extraction with IOL  . HIP FRACTURE SURGERY  11-24-10   left hip per Dr, [name]  . LUMBAR LAMINECTOMY/DECOMPRESSION MICRODISCECTOMY  11/07/2011   Procedure: LUMBAR LAMINECTOMY/DECOMPRESSION MICRODISCECTOMY;  Surgeon: Johnn Hai, MD;  Location: [facility] ORS;  Service: Orthopedics;  Laterality: N/A;  Decompression of the L4 - L5 Central (X-ray)   . partial removal of scrotum    . TONSILLECTOMY    . TOTAL HIP REVISION Left 11/05/2012   Procedure: Conversion of a Bipolar to a Left Total Hip Arthroplasty;  Surgeon: [name], MD;  Location: [facility] ORS;  Service: Orthopedics;  Laterality: Left;  Conversion of a Bipolar to a Left Total Hip Arthroplasty   Social History:  reports that he has never smoked. He has never used smokeless tobacco. He reports that he does not drink alcohol or use drugs. Patient lives at home & is able to participate in activities of daily living independently   Allergy: pcn  Family History  Problem Relation Age of Onset  . Tuberculosis        Prior to Admission medications   Medication Sig Start Date End Date Taking? Authorizing Provider  aspirin 81 MG chewable tablet Chew 1 tablet (81 mg total) by mouth daily. 11/24/12   Laurey Morale, MD  cyanocobalamin (,VITAMIN B-12,) 1000 MCG/ML injection Inject  1,000 mcg into the muscle every 30 (thirty) days.    Historical Provider, MD  cyclobenzaprine (FLEXERIL) 10 MG tablet TAKE 1 TABLET 3 TIMES DAILYAS NEEDED FOR MUSCLE SPASMS Patient taking differently: 10 mg at bedtime as needed for muscle spasms.  07/14/14   Laurey Morale, MD  Ferrous Sulfate 220 (44 FE) MG/5ML LIQD TAKE 7 MLS BY MOUTH DAILY 07/14/14   Laurey Morale, MD  finasteride (PROSCAR) 5 MG tablet TAKE 1 TABLET DAILY 11/04/15   Laurey Morale, MD  gabapentin (NEURONTIN) 400 MG capsule TAKE 1 CAPSULE 4 TIMES     DAILY 06/30/15   Laurey Morale, MD  glipiZIDE (GLUCOTROL) 10 MG tablet TAKE 1 TABLET TWICE A DAY  BEFORE MEALS 08/08/15   Laurey Morale, MD  HYDROcodone-acetaminophen (NORCO/VICODIN) 5-325 MG tablet Take 1 tablet by mouth every 6 (six) hours as needed for moderate pain. 01/31/16   Laurey Morale, MD  lidocaine (LIDODERM) 5 % Place 1 patch onto the skin daily. Remove & Discard patch within 12 hours or as directed by MD 05/31/15   Laurey Morale, MD  losartan (COZAAR) 50 MG tablet TAKE 1 TABLET EVERY MORNING 08/08/15   Laurey Morale, MD  lubiprostone (AMITIZA) 24 MCG capsule Take 1 capsule (24 mcg total) by mouth 2 (two) times daily with a meal. Patient not taking: Reported on 05/09/2016 07/15/15   Laurey Morale, MD  metFORMIN (GLUCOPHAGE) 1000 MG tablet TAKE 1 TABLET TWICE A DAY  WITH MEALS 08/23/15   Laurey Morale, MD  polyethylene glycol powder (GLYCOLAX/MIRALAX) powder MIX 1 CAPFUL IN LIQUID OF CHOICE AND DRINK ONCE DAILY AS DIRECTED 09/05/15   Laurey Morale, MD  simvastatin (ZOCOR) 40 MG tablet TAKE 1 TABLET EVERY EVENING Patient not taking: Reported on 05/09/2016 06/30/15   Laurey Morale, MD  sitaGLIPtin (JANUVIA) 100 MG tablet Take 1 tablet (100 mg total) by mouth daily. 04/26/16   Laurey Morale, MD    Physical Exam: BP 133/60 (BP Location: Left Arm)   Pulse 75   Temp 99 F (37.2 C) (Oral)   Resp 16   SpO2 98%   General:  +jaundice, NAD Eyes: PERRL ENT: unremarkable Neck:  supple, no JVD Cardiovascular: RRR Respiratory: CTABL Abdomen: soft/ND/ND, positive bowel sounds Skin: no rash Musculoskeletal:  No edema Psychiatric: calm/cooperative Neurologic: no focal findings            Labs on Admission:  Basic Metabolic Panel:  Recent Labs Lab 05/17/16 1227  NA 134*  K 6.4*  CL 100  CO2 23  GLUCOSE 233*  BUN 25*  CREATININE 1.13  CALCIUM 9.5   Liver Function Tests:  Recent Labs Lab 05/17/16 1227  AST 179*  ALT 155*  ALKPHOS 673*  BILITOT 5.2*  PROT 7.0  ALBUMIN 3.8   No results for input(s): LIPASE, AMYLASE in the last 168 hours. No results for input(s): AMMONIA in the last 168 hours. CBC:  Recent Labs Lab 05/17/16 1227  WBC 20.2 Repeated and verified X2.*  NEUTROABS 17.2*  HGB 9.4*  HCT 28.0*  MCV 92.6  PLT 348.0   Cardiac Enzymes: No results for input(s): CKTOTAL, CKMB, CKMBINDEX, TROPONINI in the last 168 hours.  BNP (last 3 results) No results for input(s): BNP in the last 8760 hours.  ProBNP (last 3 results) No results for input(s): PROBNP in the last 8760 hours.  CBG: No results for input(s): GLUCAP in the last 168 hours.  Radiological Exams on Admission: No results found.  EKG: Independently reviewed. Sinus rhythm, QTc unremarkable, no acute st/t changes  Assessment/Plan Present on Admission: **None**   Ampulla lesion with  biliary dilatation, painless  jaundice,  elevation of left,  Clears tonight, npo after midnight, gentle hydration,  [facility] consider ercp in am, hold asa, use scd for dvt prophylaxis for now  Leukocytosis: no fever, patient currently does not look septic Possible combination of dehydration, stress, not able to rule out underline infection will get ua, blood  Culture, lung exam clear, no cough, will not order cxr,   check lactic acid, procalcitonin, start ivf, empirica abx with cipro and flagyl  Hyperkalemia:  ekg no acute changes, Keep on tele, hold cozaar, Give kayexalate, insulin,  ivf,      noninsulin dependent dm2, Recent a1c 7.6 (02/2016)  hold oral meds, start ssi  HTN, hold cozaar, prn hydralazine for now  Chronic constipation: continue stool softener.   DVT prophylaxis: scd, for ercp tomorrow  Consultants:  [facility]  Code Status: full   Family Communication:  Patient   Disposition Plan: admit to med tele  Time spent: 37mins  Quintell Bonnin MD, PhD [facility] Pager [phone] If 7PM-7AM, please contact night-coverage at [email or web address], password [facility]

## 2016-05-18 ENCOUNTER — Encounter (HOSPITAL_COMMUNITY)
Admission: AD | Disposition: A | Discharge status: Rehabilitation Facility | Payer: Self-pay | Source: Ambulatory Visit | Attending: Internal Medicine

## 2016-05-18 ENCOUNTER — Inpatient Hospital Stay (HOSPITAL_COMMUNITY): Payer: Medicare Other | Admitting: Certified Registered"

## 2016-05-18 ENCOUNTER — Other Ambulatory Visit: Payer: Self-pay | Admitting: *Deleted

## 2016-05-18 ENCOUNTER — Encounter (HOSPITAL_COMMUNITY): Payer: Self-pay

## 2016-05-18 DIAGNOSIS — E43 Unspecified severe protein-calorie malnutrition: Secondary | ICD-10-CM | POA: Insufficient documentation

## 2016-05-18 DIAGNOSIS — R932 Abnormal findings on diagnostic imaging of liver and biliary tract: Secondary | ICD-10-CM

## 2016-05-18 DIAGNOSIS — R7989 Other specified abnormal findings of blood chemistry: Secondary | ICD-10-CM

## 2016-05-18 DIAGNOSIS — I480 Paroxysmal atrial fibrillation: Secondary | ICD-10-CM

## 2016-05-18 HISTORY — DX: Paroxysmal atrial fibrillation: I48.0

## 2016-05-18 LAB — GLUCOSE, CAPILLARY
GLUCOSE-CAPILLARY: 297 mg/dL — AB (ref 65–99)
Glucose-Capillary: 101 mg/dL — ABNORMAL HIGH (ref 65–99)
Glucose-Capillary: 199 mg/dL — ABNORMAL HIGH (ref 65–99)
Glucose-Capillary: 68 mg/dL (ref 65–99)
Glucose-Capillary: 76 mg/dL (ref 65–99)

## 2016-05-18 LAB — CBC WITH DIFFERENTIAL/PLATELET
BASOS PCT: 0 %
Basophils Absolute: 0 10*3/uL (ref 0.0–0.1)
EOS PCT: 2 %
Eosinophils Absolute: 0.1 10*3/uL (ref 0.0–0.7)
HEMATOCRIT: 23.5 % — AB (ref 39.0–52.0)
Hemoglobin: 8 g/dL — ABNORMAL LOW (ref 13.0–17.0)
Lymphocytes Relative: 10 %
Lymphs Abs: 0.9 10*3/uL (ref 0.7–4.0)
MCH: 31.3 pg (ref 26.0–34.0)
MCHC: 34 g/dL (ref 30.0–36.0)
MCV: 91.8 fL (ref 78.0–100.0)
MONO ABS: 0.7 10*3/uL (ref 0.1–1.0)
MONOS PCT: 8 %
NEUTROS ABS: 7 10*3/uL (ref 1.7–7.7)
Neutrophils Relative %: 80 %
PLATELETS: 272 10*3/uL (ref 150–400)
RBC: 2.56 MIL/uL — ABNORMAL LOW (ref 4.22–5.81)
RDW: 14.1 % (ref 11.5–15.5)
WBC: 8.8 10*3/uL (ref 4.0–10.5)

## 2016-05-18 LAB — COMPREHENSIVE METABOLIC PANEL
ALT: 148 U/L — ABNORMAL HIGH (ref 17–63)
ANION GAP: 8 (ref 5–15)
AST: 169 U/L — ABNORMAL HIGH (ref 15–41)
Albumin: 3 g/dL — ABNORMAL LOW (ref 3.5–5.0)
Alkaline Phosphatase: 523 U/L — ABNORMAL HIGH (ref 38–126)
BILIRUBIN TOTAL: 4.5 mg/dL — AB (ref 0.3–1.2)
BUN: 18 mg/dL (ref 6–20)
CO2: 22 mmol/L (ref 22–32)
Calcium: 8.1 mg/dL — ABNORMAL LOW (ref 8.9–10.3)
Chloride: 102 mmol/L (ref 101–111)
Creatinine, Ser: 0.76 mg/dL (ref 0.61–1.24)
GFR calc Af Amer: >60 mL/min (ref >60)
Glucose, Bld: 103 mg/dL — ABNORMAL HIGH (ref 65–99)
POTASSIUM: 4.3 mmol/L (ref 3.5–5.1)
Sodium: 132 mmol/L — ABNORMAL LOW (ref 135–145)
TOTAL PROTEIN: 6 g/dL — AB (ref 6.5–8.1)

## 2016-05-18 LAB — HEPARIN LEVEL (UNFRACTIONATED): HEPARIN UNFRACTIONATED: 0.42 [IU]/mL (ref 0.30–0.70)

## 2016-05-18 LAB — PROTIME-INR
INR: 1.2
PROTHROMBIN TIME: 15.3 s — AB (ref 11.4–15.2)

## 2016-05-18 LAB — TSH: TSH: 2.356 u[IU]/mL (ref 0.350–4.500)

## 2016-05-18 LAB — PROCALCITONIN: PROCALCITONIN: 2.9 ng/mL

## 2016-05-18 SURGERY — CANCELLED PROCEDURE

## 2016-05-18 MED ORDER — METOPROLOL TARTRATE 25 MG PO TABS: 25.0000 mg | ORAL_TABLET | Freq: Two times a day (BID) | ORAL | Status: DC

## 2016-05-18 MED ORDER — PROPOFOL 10 MG/ML IV BOLUS
INTRAVENOUS | Status: AC
  Filled 2016-05-18: qty 20

## 2016-05-18 MED ORDER — FENTANYL CITRATE (PF) 100 MCG/2ML IJ SOLN
INTRAMUSCULAR | Status: AC
  Filled 2016-05-18: qty 2

## 2016-05-18 MED ORDER — SODIUM CHLORIDE 0.9 % IV SOLN: INTRAVENOUS | Status: DC

## 2016-05-18 MED ORDER — MIDAZOLAM HCL 2 MG/2ML IJ SOLN
INTRAMUSCULAR | Status: AC
  Filled 2016-05-18: qty 2

## 2016-05-18 MED ORDER — DEXTROSE 5 % IV SOLN
5.0000 mg/h | INTRAVENOUS | Status: DC
  Administered 2016-05-18 (×2): 15 mg/h via INTRAVENOUS
  Filled 2016-05-18 (×2): qty 100

## 2016-05-18 MED ORDER — DILTIAZEM HCL 60 MG PO TABS
60.0000 mg | ORAL_TABLET | Freq: Four times a day (QID) | ORAL | Status: DC
  Administered 2016-05-18 (×2): 60 mg via ORAL
  Filled 2016-05-18 (×2): qty 1

## 2016-05-18 MED ORDER — INDOMETHACIN 50 MG RE SUPP: 100.0000 mg | Freq: Once | RECTAL | Status: DC

## 2016-05-18 MED ORDER — GABAPENTIN 400 MG PO CAPS
400.0000 mg | ORAL_CAPSULE | Freq: Four times a day (QID) | ORAL | Status: DC
  Administered 2016-05-18 – 2016-05-30 (×47): 400 mg via ORAL
  Filled 2016-05-18 (×47): qty 1

## 2016-05-18 MED ORDER — HEPARIN (PORCINE) IN NACL 100-0.45 UNIT/ML-% IJ SOLN
1400.0000 [IU]/h | INTRAMUSCULAR | Status: AC
  Administered 2016-05-18 – 2016-05-21 (×4): 1400 [IU]/h via INTRAVENOUS
  Filled 2016-05-18 (×5): qty 250

## 2016-05-18 MED ORDER — HEPARIN BOLUS VIA INFUSION
2500.0000 [IU] | Freq: Once | INTRAVENOUS | Status: AC
  Administered 2016-05-18: 2500 [IU] via INTRAVENOUS
  Filled 2016-05-18: qty 2500

## 2016-05-18 MED ORDER — DILTIAZEM LOAD VIA INFUSION
10.0000 mg | Freq: Once | INTRAVENOUS | Status: AC
  Administered 2016-05-18: 10 mg via INTRAVENOUS
  Filled 2016-05-18: qty 10

## 2016-05-18 MED ORDER — ENSURE ENLIVE PO LIQD
237.0000 mL | Freq: Two times a day (BID) | ORAL | Status: DC
  Administered 2016-05-19 – 2016-05-26 (×12): 237 mL via ORAL

## 2016-05-18 NOTE — Consult Note (Signed)
CARDIOLOGY CONSULT NOTE   Patient ID: Andrew Norton MRN: [id number] DOB/AGE: February 09, 1927 82 y.o.  Admit date: 05/17/2016  Requesting Physician: Dr. Fuller Plan Primary Physician:   Laurey Morale, MD Primary Cardiologist:  New Reason for Consultation:  New onset afib with RVR  HPI: Andrew Norton is a 82 y.o. male with a history of HTN, DMT2, cervical myelopathy with gain instability who was directly admitted to [facility] on 05/17/16 for new onset of jaundice and abnormal CT ab/pel scan which showed "intrahepatic biliary ductal dilatation." He was being wheeled down to endoscopy for ERCP with biopsy and stent when he was noticed to go into afib with RVR and cardiology consulted.   He had recently been evaluated for new onset of jaundice, poor appetite, weight loss, and abnormal CT scan done one week ago showing a dilated common bile duct, intrahepatic ductal dilation, and abrupt narrowing of the common bile duct at the ampulla where there is a polypoid lesion 1.5 cm, 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  Concern for possible cholangitis. He was directly admitted from GI office to [facility] on 05/17/16 for continued work up and ERCP with biopsy and stent. He was started on Cipro and Flagyl.  On his way down to endoscopy for procedure today when he went into afib with RVR. Patient does not feel this. He denies chest pain or SOB. Only has a HA currently. He denies a history of exertional chest pain or SOB. No LE edema, orthopnea or PND. Some dizziness from time to time but no syncope. No blood in his stool or urine.    Past Medical History:  Diagnosis Date  . Anemia   . Arthritis    hips  . Cancer ([place])    skin can lesions, and non cancer skin lesions-follwed by Dr. Nevada Crane  . Carpal tunnel syndrome    left leg sciatica  . Diabetes mellitus   . Easy bruising   . Hearing loss    left ear greater loss  . History of shingles    past hx. left hand  . Hyperlipidemia   . Hypertension    EKG, chest x ray 11/15/10 EPIC,   clearance Dr Sharlene Motts on chart/ pt states on meds to "prevent hypertension from diabetes"  . Paget's disease    scrotum  . Positive PPD, treated   . Renal insufficiency   . Stroke [facility])    SPINAL CORD STROKE PER OV note 3/13  DR LOVE CLEARANCE NOTE_  ON CHART WITH RECOMMENDATIONS-   has weakness  upper extremities     Past Surgical History:  Procedure Laterality Date  . CERVICAL FUSION     dr Vertell Limber 2009- retained hardware  . colonoscopy  2-08   per Dr. Oletta Lamas, normal   . EYE SURGERY     bilateral cataract extraction with IOL  . HIP FRACTURE SURGERY  11-24-10   left hip per Dr, [name]  . LUMBAR LAMINECTOMY/DECOMPRESSION MICRODISCECTOMY  11/07/2011   Procedure: LUMBAR LAMINECTOMY/DECOMPRESSION MICRODISCECTOMY;  Surgeon: Johnn Hai, MD;  Location: [facility] ORS;  Service: Orthopedics;  Laterality: N/A;  Decompression of the L4 - L5 Central (X-ray)   . partial removal of scrotum    . TONSILLECTOMY    . TOTAL HIP REVISION Left 11/05/2012   Procedure: Conversion of a Bipolar to a Left Total Hip Arthroplasty;  Surgeon: [name], MD;  Location: [facility] ORS;  Service: Orthopedics;  Laterality: Left;  Conversion of a Bipolar to a Left Total Hip Arthroplasty  I have reviewed the patient's current medications . [MAR Hold] ciprofloxacin  400 mg Intravenous Q12H  . diltiazem  10 mg Intravenous Once  . [MAR Hold] finasteride  5 mg Oral Daily  . indomethacin  100 mg Rectal Once  . [MAR Hold] insulin aspart  0-9 Units Subcutaneous TID WC  . [MAR Hold] lidocaine  1 patch Transdermal Q24H  . [MAR Hold] metronidazole  500 mg Intravenous Q8H  . [MAR Hold] senna-docusate  1 tablet Oral BID  . [MAR Hold] sodium chloride flush  3 mL Intravenous Q12H   . sodium chloride 100 mL/hr at 05/18/16 0946  . sodium chloride    . diltiazem (CARDIZEM) infusion     [MAR Hold] cyclobenzaprine, [MAR Hold] hydrALAZINE, [MAR Hold] HYDROcodone-acetaminophen  Prior to Admission medications   Medication Sig  Start Date End Date Taking? Authorizing Provider  aspirin 81 MG chewable tablet Chew 1 tablet (81 mg total) by mouth daily. 11/24/12  Yes Laurey Morale, MD  cyanocobalamin (,VITAMIN B-12,) 1000 MCG/ML injection Inject 1,000 mcg into the muscle every 30 (thirty) days.   Yes Historical Provider, MD  cyclobenzaprine (FLEXERIL) 10 MG tablet TAKE 1 TABLET 3 TIMES DAILYAS NEEDED FOR MUSCLE SPASMS Patient taking differently: Take 10 mg by mouth at bedtime as needed for muscle spasms.  07/14/14  Yes Laurey Morale, MD  Ferrous Sulfate 220 (44 FE) MG/5ML LIQD TAKE 7 MLS BY MOUTH DAILY 07/14/14  Yes Laurey Morale, MD  finasteride (PROSCAR) 5 MG tablet TAKE 1 TABLET DAILY Patient taking differently: TAKE 1 TABLET PO DAILY 11/04/15  Yes Laurey Morale, MD  gabapentin (NEURONTIN) 400 MG capsule TAKE 1 CAPSULE 4 TIMES     DAILY 06/30/15  Yes Laurey Morale, MD  glipiZIDE (GLUCOTROL) 10 MG tablet TAKE 1 TABLET TWICE A DAY  BEFORE MEALS 08/08/15  Yes Laurey Morale, MD  HYDROcodone-acetaminophen (NORCO/VICODIN) 5-325 MG tablet Take 1 tablet by mouth every 6 (six) hours as needed for moderate pain. 01/31/16  Yes Laurey Morale, MD  lidocaine (LIDODERM) 5 % Place 1 patch onto the skin daily. Remove & Discard patch within 12 hours or as directed by MD 05/31/15  Yes Laurey Morale, MD  losartan (COZAAR) 50 MG tablet TAKE 1 TABLET EVERY MORNING Patient taking differently: TAKE 1 TABLET PO EVERY MORNING 08/08/15  Yes Laurey Morale, MD  metFORMIN (GLUCOPHAGE) 1000 MG tablet TAKE 1 TABLET TWICE A DAY  WITH MEALS 08/23/15  Yes Laurey Morale, MD  polyethylene glycol powder (GLYCOLAX/MIRALAX) powder MIX 1 CAPFUL IN LIQUID OF CHOICE AND DRINK ONCE DAILY AS DIRECTED 09/05/15  Yes Laurey Morale, MD  sitaGLIPtin (JANUVIA) 100 MG tablet Take 1 tablet (100 mg total) by mouth daily. 04/26/16  Yes Laurey Morale, MD     Social History   Social History  . Marital status: Married    Spouse name: [name]  . Number of children: 3  . Years of  education: College-BS   Occupational History  . retired Retired   Social History Main Topics  . Smoking status: Never Smoker  . Smokeless tobacco: Never Used  . Alcohol use No  . Drug use: No  . Sexual activity: No   Other Topics Concern  . Not on file   Social History Narrative   Pt lives at home with wife [name])   Pt is right handed   Education college-BS   Pt states that he drinks 1 cup of coffee daily, and occ  may have a cup of tea or soda    Family Status  Relation Status  . Mother Deceased  . Father Deceased  .     Family History  Problem Relation Age of Onset  . Tuberculosis       ROS:  Full 14 point review of systems complete and found to be negative unless listed above.  Physical Exam: Blood pressure (!) 150/75, pulse 69, temperature 98.3 F (36.8 C), temperature source Oral, resp. rate 18, height 5\' 10"  (1.778 m), weight 154 lb 1.6 oz (69.9 kg), SpO2 100 %.  General: Well developed, well nourished, male in no acute distress Head: Eyes PERRLA, No xanthomas.   Normocephalic and atraumatic, oropharynx without edema or exudate. Dentition:  Lungs: Ctab Heart: HRRR S1 S2, no rub/gallop, Heart irregular rate and tachy with S1, S2  murmur. pulses are 2+ extrem.   Neck: No carotid bruits. No lymphadenopathy.  NO JVD. Abdomen: Bowel sounds present, abdomen soft and non-tender without masses or hernias noted. Msk:  No spine or cva tenderness. No weakness, no joint deformities or effusions. Extremities: No clubbing or cyanosis. No LE edema.  Neuro: Alert and oriented X 3. No focal deficits noted. Psych:  Good affect, responds appropriately Skin: No rashes or lesions noted.  Labs:   Lab Results  Component Value Date   WBC 8.8 05/18/2016   HGB 8.0 (L) 05/18/2016   HCT 23.5 (L) 05/18/2016   MCV 91.8 05/18/2016   PLT 272 05/18/2016    Recent Labs  05/18/16 0357  INR 1.20    Recent Labs Lab 05/18/16 0357  NA 132*  K 4.3  CL 102  CO2 22  BUN 18    CREATININE 0.76  CALCIUM 8.1*  PROT 6.0*  BILITOT 4.5*  ALKPHOS 523*  ALT 148*  AST 169*  GLUCOSE 103*  ALBUMIN 3.0*   Lab Results  Component Value Date   CHOL 152 10/05/2015   HDL 43.00 10/05/2015   LDLCALC 72 10/05/2015   TRIG 184.0 (H) 10/05/2015   No results found for: DDIMER Lipase  Date/Time Value Ref Range Status  05/09/2016 04:27 PM 27.0 11.0 - 59.0 U/L Final   Amylase  Date/Time Value Ref Range Status  05/09/2016 04:27 PM 40 27 - 131 U/L Final   TSH  Date/Time Value Ref Range Status  08/24/2015 12:29 PM 2.73 0.35 - 4.50 uIU/mL Final   Vitamin B-12  Date/Time Value Ref Range Status  04/16/2015 05:44 AM 321 180 - 914 pg/mL Final    Comment:    (NOTE) This assay is not validated for testing neonatal or myeloproliferative syndrome specimens for Vitamin B12 levels. Performed at [facility]    Folate  Date/Time Value Ref Range Status  04/16/2015 05:44 AM 21.1 >5.9 ng/mL Final    Comment:    Performed at [facility]   Ferritin  Date/Time Value Ref Range Status  04/16/2015 05:44 AM 288 24 - 336 ng/mL Final    Comment:    Performed at [facility]   TIBC  Date/Time Value Ref Range Status  04/16/2015 05:44 AM 225 (L) 250 - 450 ug/dL Final   Iron  Date/Time Value Ref Range Status  04/16/2015 05:44 AM 30 (L) 45 - 182 ug/dL Final   Retic Ct Pct  Date/Time Value Ref Range Status  04/16/2015 05:44 AM 1.3 0.4 - 3.1 % Final    Echo: none   ECG:  Sinus with intermittent afib with RVR  Radiology:  No results  found.  ASSESSMENT AND PLAN:    Active Problems:   HYPERKALEMIA   Constipation   Jaundice   Diabetes mellitus type 2, noninsulin dependent (HCC)   LFT elevation   Leukocytosis   Ampulla of Vater mass  New onset afib with RVR: HR elevated from 130-170. I started him on IV dilt bolus and gtt with some improvement in HR. He convert to NSR and will convert him to 60mg  po dilt q6 and stop gtt in 1 hour. -- Will check a  TSH and 2D ECHO -- CHADSVASC score of at least 4 (HTN, age, DMT2). This may be an isolated event related to acute illness however, if he does not convert to NSR or has intermittent afib, he would requiring [place] with a DOAC. This can be started after surgery. Will start IV heparin for now which can be stopped before surgery. Will stop indomethacin   Possible cholangitis: continue Abx per primary team and GI. ERCP with biopsy and stent will be postponed until tomorrow after HR under better control  DMT2: SSI per primary team  HTN: BP is elevated currently. Will continue dilt gtt for now.  Peripheral neuropathy: patient requesting his Neurontin. I will reorder this for him    Signed: Angelena Form, PA-C 05/18/2016 2:22 PM  Pager [phone]  Co-Sign MD

## 2016-05-18 NOTE — Progress Notes (Signed)
ANTICOAGULATION CONSULT NOTE - Initial Consult  Pharmacy Consult for Heparin Indication: atrial fibrillation  Patient Measurements: Height: 5\' 10"  (177.8 cm) Weight: 154 lb 1.6 oz (69.9 kg) IBW/kg (Calculated) : 73 Heparin Dosing Weight: 70kg  Vital Signs: Temp: 98.3 F (36.8 C) (11/03 1251) Temp Source: Oral (11/03 1251) BP: 150/75 (11/03 1325) Pulse Rate: 69 (11/03 1329)  Labs:  Recent Labs  05/17/16 1227 05/18/16 0357  HGB 9.4* 8.0*  HCT 28.0* 23.5*  PLT 348.0 272  LABPROT 12.7 15.3*  INR 1.2* 1.20  CREATININE 1.13 0.76   Estimated Creatinine Clearance: 64.3 mL/min (by C-G formula based on SCr of 0.76 mg/dL).  Medical History: Past Medical History:  Diagnosis Date  . Anemia   . Arthritis    hips  . Atrial fibrillation ([place])   . Carpal tunnel syndrome    left leg sciatica  . Diabetes mellitus   . Hearing loss    left ear greater loss  . History of shingles    past hx. left hand  . Hyperlipidemia   . Hypertension    EKG, chest x ray 11/15/10 EPIC,  clearance Dr Sharlene Motts on chart/ pt states on meds to "prevent hypertension from diabetes"  . Paget's disease    scrotum  . Positive PPD, treated    Medications:  Scheduled:  . ciprofloxacin  400 mg Intravenous Q12H  . diltiazem  60 mg Oral Q6H  . finasteride  5 mg Oral Daily  . gabapentin  400 mg Oral QID  . heparin  2,500 Units Intravenous Once  . insulin aspart  0-9 Units Subcutaneous TID WC  . lidocaine  1 patch Transdermal Q24H  . metronidazole  500 mg Intravenous Q8H  . senna-docusate  1 tablet Oral BID  . sodium chloride flush  3 mL Intravenous Q12H   Infusions:  . sodium chloride 100 mL/hr at 05/18/16 0946  . diltiazem (CARDIZEM) infusion 15 mg/hr (05/18/16 1456)  . heparin     Assessment: 64 yoM with new onset jaundice, direct admit 11/2 from [facility]. CT: intrahepatic biliary ductal dilatation. Planned ERCP 11/3, but developed Afib, begin Diltiazem, Heparin infusion/Rx, and postpone ERCP to  11/4  Today, 05/18/2016   INR ~ elevated with elevated LFT's, no anti-coagulation PTA  Goal of Therapy:  Heparin level 0.3-0.7 units/ml Monitor platelets by anticoagulation protocol: Yes   Plan:   Heparin 2500 unit bolus, infusion at 1400 units/hr  Daily CBC  1st Heparin level in 8 hr  Follow up plans for ERCP 11/4  Minda Ditto PharmD Pager [phone] 05/18/2016, 5:40 PM

## 2016-05-18 NOTE — Progress Notes (Signed)
PROGRESS NOTE    Andrew Norton  [id number] DOB: 07-10-1927 DOA: 05/17/2016 PCP: Laurey Morale, MD    Brief Narrative:  Andrew Norton is a 82 y.o. male with h/o HTN, noninsulin dependent dm2, chronic constipation, b12 deficiency, neuropathy with gait instability is sent from [place] [facility] for direct admission. Patient is referred to [facility] by pmd due to new onset of jaundice and abnormal CT ab/pel scan which showed "intrahepatic biliary ductal dilatation. No focal hepatic mass. No calcified gallstones are noted within gallbladder. There is significant CBD dilatation up to 1.8 cm. There is abrupt narrowing of CBD at the level of ampulla There is a polypoid lesion at the level of ampulla within duodenum measures about 1.5 cm axial image 47. Ampullary mass cannot be excluded. Further correlation with ERCP is recommended."  Labs done at [facility] office today showed leukocytosis wbc of 20.2, hyperkalemia, k 6.4, bun 25, cr 1.13, elevated lft which is worsening. hospitalist called to direct admit the patient.   Patient says that he has not been feeling well over the past couple of weeks and his wife first notice some mild jaundice about 3 weeks ago. He has decreased appetite, no abdominal pain, no nausea or vomiting. He report decreased urination in the last three weeks. His weight has dropped from 170 to baseline to 158 today. He denies fever or chills, no cough, no chest pain. He walks with a cane during day time and a walker at night time due to chronic gait instability.    Assessment & Plan:   Active Problems:   HYPERKALEMIA   Constipation   Jaundice   Diabetes mellitus type 2, noninsulin dependent (HCC)   LFT elevation   Leukocytosis   Ampulla of Vater mass   Ampulla lesion with  biliary dilatation, painless  jaundice,  elevation of left,  ERCP on hold 2/2 new onset atrial fibrillation Heparin for now as well as scd's Full liquid diet  Atrial Fibrillation, new onset Cardiology consulted Rate  controlled with diltiazem Diltiazem 60mg  q6h per cardiology Echo and TSH pending   Leukocytosis:  Improved with Cipro and Flagyl Will repeat CBCD in am Pro- calcitonin of 10 Will transition to PO flagyl when patient tolerating PO  Hyperkalemia:  Resolved Repeat BMP in am      noninsulin dependent dm2, Recent a1c 7.6 (02/2016)  hold oral meds start ssi CBG well controlled   HTN hold cozaar Diltiazem drip prn hydralazine for now  Chronic constipation:  continue stool softener.    DVT prophylaxis: SCD's Code Status: Full Code Family Communication: No family bedside Disposition Plan: pending    Consultants:   [facility]  Procedures:   Plan for ERCP today  Antimicrobials:   Ciprofloxacin  Flagyl    Subjective: Patient seen and examined during multidisciplinary rounds.  He voices he understands he is to go to ERCP.  No other concerns voiced.  Per notes from GI patient developed atrial fibrillation just prior to procedure.  Cardiology consulted.  ERCP put on hold until patient rate controlled.    Objective: Vitals:   05/17/16 1743 05/17/16 2243 05/18/16 0541  BP: 133/60 (!) 152/74 (!) 175/75  Pulse: 75 69 74  Resp: 16 16 18   Temp: 99 F (37.2 C) 97.5 F (36.4 C) 98 F (36.7 C)  TempSrc: Oral Oral Oral  SpO2: 98% 100% 100%  Weight: 69.9 kg (154 lb 1.6 oz)    Height: 5\' 10"  (1.778 m)      Intake/Output Summary (Last  24 hours) at 05/18/16 0756 Last data filed at 05/18/16 0600  Gross per 24 hour  Intake             1970 ml  Output              750 ml  Net             1220 ml   Filed Weights   05/17/16 1743  Weight: 69.9 kg (154 lb 1.6 oz)    Examination:  General exam: Appears calm and comfortable  Respiratory system: Clear to auscultation. Respiratory effort normal. Cardiovascular system: S1 & S2 heard, IRRR. No JVD, rubs, gallops or clicks. No pedal edema. Gastrointestinal system: Abdomen is nondistended, soft and nontender. No  organomegaly or masses felt. Normal bowel sounds heard. Central nervous system: Alert and oriented. No focal neurological deficits. Extremities: Symmetric 5 x 5 power. Skin: No rashes, lesions or ulcers, jaundice Psychiatry: Judgement and insight appear normal. Mood & affect appropriate.     Data Reviewed: I have personally reviewed following labs and imaging studies  CBC:  Recent Labs Lab 05/17/16 1227 05/18/16 0357  WBC 20.2 Repeated and verified X2.* 8.8  NEUTROABS 17.2* 7.0  HGB 9.4* 8.0*  HCT 28.0* 23.5*  MCV 92.6 91.8  PLT 348.0 [id number]   Basic Metabolic Panel:  Recent Labs Lab 05/17/16 1227 05/18/16 0357  NA 134* 132*  K 6.4* 4.3  CL 100 102  CO2 23 22  GLUCOSE 233* 103*  BUN 25* 18  CREATININE 1.13 0.76  CALCIUM 9.5 8.1*   GFR: Estimated Creatinine Clearance: 64.3 mL/min (by C-G formula based on SCr of 0.76 mg/dL). Liver Function Tests:  Recent Labs Lab 05/17/16 1227 05/18/16 0357  AST 179* 169*  ALT 155* 148*  ALKPHOS 673* 523*  BILITOT 5.2* 4.5*  PROT 7.0 6.0*  ALBUMIN 3.8 3.0*   No results for input(s): LIPASE, AMYLASE in the last 168 hours. No results for input(s): AMMONIA in the last 168 hours. Coagulation Profile:  Recent Labs Lab 05/17/16 1227 05/18/16 0357  INR 1.2* 1.20   Cardiac Enzymes: No results for input(s): CKTOTAL, CKMB, CKMBINDEX, TROPONINI in the last 168 hours. BNP (last 3 results) No results for input(s): PROBNP in the last 8760 hours. HbA1C: No results for input(s): HGBA1C in the last 72 hours. CBG:  Recent Labs Lab 05/17/16 2246 05/17/16 2305 05/18/16 0025  GLUCAP 40* 56* 68   Lipid Profile: No results for input(s): CHOL, HDL, LDLCALC, TRIG, CHOLHDL, LDLDIRECT in the last 72 hours. Thyroid Function Tests: No results for input(s): TSH, T4TOTAL, FREET4, T3FREE, THYROIDAB in the last 72 hours. Anemia Panel: No results for input(s): VITAMINB12, FOLATE, FERRITIN, TIBC, IRON, RETICCTPCT in the last 72  hours. Sepsis Labs:  Recent Labs Lab 05/17/16 1833 05/17/16 1834 05/17/16 2151  PROCALCITON 10.78  --   --   LATICACIDVEN  --  3.0* 1.7    No results found for this or any previous visit (from the past 240 hour(s)).       Radiology Studies: No results found.      Scheduled Meds: . ciprofloxacin  400 mg Intravenous Q12H  . finasteride  5 mg Oral Daily  . insulin aspart  0-9 Units Subcutaneous TID WC  . lidocaine  1 patch Transdermal Q24H  . metronidazole  500 mg Intravenous Q8H  . senna-docusate  1 tablet Oral BID  . sodium chloride flush  3 mL Intravenous Q12H   Continuous Infusions: . sodium chloride 100 mL/hr at 05/17/16  2248     LOS: 1 day    Time spent: 30 minutes    Newman Pies, MD [facility] Pager [phone]  If 7PM-7AM, please contact night-coverage [email or web address] Password [email or web address] 05/18/2016, 7:56 AM

## 2016-05-18 NOTE — Progress Notes (Signed)
Hypoglycemic Event  CBG: 40  Treatment: 15 GM carbohydrate snack  Symptoms: None  Follow-up CBG: Time:2305 CBG Result:56  Possible Reasons for Event: lack of PO intake  Comments/MD notified: PO intake encouraged, rechecked cbg at 0025 resulting 68. Will continue to monitor pt.     Arville Lime

## 2016-05-18 NOTE — OR Nursing (Signed)
Pt. Arrived to unit from floor for procedure and found to be in a high, irregular heart rhythm.  Dr. Fuller Plan, Dr. Conrad [place], anesthesia and Endo Charge Nurse made aware.  12 lead ordered and completed.  Pt. Assessed and c/o headache.  Pt. Wife at bedside.  Cardiology consult placed.  Pt. Made comfortable and monitored while awaiting cardiology.    Laverta Baltimore, RN

## 2016-05-18 NOTE — Progress Notes (Signed)
     [facility] Progress Note  Subjective:  For ERCP later today.  Feels ok.  Objective:  Vital signs in last 24 hours: Temp:  [97.5 F (36.4 C)-99 F (37.2 C)] 98 F (36.7 C) (11/03 0541) Pulse Rate:  [69-87] 74 (11/03 0541) Resp:  [16-18] 18 (11/03 0541) BP: (98-175)/(48-75) 175/75 (11/03 0541) SpO2:  [98 %-100 %] 100 % (11/03 0541) Weight:  [154 lb 1.6 oz (69.9 kg)-158 lb (71.7 kg)] 154 lb 1.6 oz (69.9 kg) (11/02 1743) Last BM Date: 05/17/16   General:  Alert, well-developed, in NAD; jaundice noted Heart:  Regular rate and rhythm Pulm:  CTAB.  No W/R/R. Abdomen:  Soft, non-distended.  BS present.  Non-tender.  Extremities:  Without edema. Neurologic:  Alert and oriented x 4;  grossly normal neurologically. Psych:  Alert and cooperative. Normal mood and affect.  Intake/Output from previous day: 11/02 0701 - 11/03 0700 In: 1970 [I.V.:720; IV Piggyback:250] Out: 750 [Urine:750] Intake/Output this shift: Total I/O In: -  Out: 500 [Urine:500]  Lab Results:  Recent Labs  05/17/16 1227 05/18/16 0357  WBC 20.2 Repeated and verified X2.* 8.8  HGB 9.4* 8.0*  HCT 28.0* 23.5*  PLT 348.0 272   BMET  Recent Labs  05/17/16 1227 05/18/16 0357  NA 134* 132*  K 6.4* 4.3  CL 100 102  CO2 23 22  GLUCOSE 233* 103*  BUN 25* 18  CREATININE 1.13 0.76  CALCIUM 9.5 8.1*   LFT  Recent Labs  05/18/16 0357  PROT 6.0*  ALBUMIN 3.0*  AST 169*  ALT 148*  ALKPHOS 523*  BILITOT 4.5*   PT/INR  Recent Labs  05/17/16 1227 05/18/16 0357  LABPROT 12.7 15.3*  INR 1.2* 1.20    Assessment / Plan: #16 82 year old white male with new onset of jaundice, poor appetite, weight loss, and abnormal CT scan done one week ago showing a dilated common bile duct, intrahepatic ductal dilation, and abrupt narrowing of the common bile duct at the ampulla where there is a polypoid lesion 1.5 cm, 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  Concern for possible cholangitis.   #2  adult-onset diabetes mellitus #3 peripheral neuropathy #4 hyperkalemia:  Corrected/improved this AM  #5 leukocytosis:  Secondary to above.  Resolved on abx (cipro and flagyl). #6 anemia  -For ERCP with biopsy and stent later today.     LOS: 1 day   ZEHR, JESSICA D.  05/18/2016, 8:57 AM  Pager number [phone]     Attending physician's note   I have taken an interval history, reviewed the chart and examined the patient. I agree with the Advanced Practitioner's note, impression and recommendations. Biliary obstruction with an ampullary lesion and associated biliary stricture. Cholelithiasis. He was hospitalized yesterday for possible cholangitis-WBC has improved and he is afebrile on IV Cipro and Flagyl. Anemia with decreased Hb-likely a dilutional effect. For ERCP today.   Lucio Edward, MD [facility] [phone] Mon-Fri 8a-5p [phone] after 5p, weekends, holidays

## 2016-05-18 NOTE — Progress Notes (Signed)
Initial Nutrition Assessment  DOCUMENTATION CODES:   Severe malnutrition in context of acute illness/injury  INTERVENTION:  Provide Ensure Enlive po BID, each supplement provides 350 kcal and 20 grams of protein.  Advance diet per MD after ERCP tomorrow.  Encouraged adequate intake of protein and calories with meals in setting of recent weight loss. Encouraged intake of Ensure Enlive BID at home after discharge for at least 1 month or until patient able to gain weight back.  NUTRITION DIAGNOSIS:   Increased nutrient needs related to catabolic illness (cholangitis) as evidenced by estimated needs.  GOAL:   Patient will meet greater than or equal to 90% of their needs  MONITOR:   PO intake, Supplement acceptance, Labs, Weight trends, I & O's  REASON FOR ASSESSMENT:   Malnutrition Screening Tool    ASSESSMENT:   82 y.o. male with history of HTN, noninsulin dependent dm2, chronic constipation, B12 deficiency, neuropathy with gait instability is sent from [facility] for direct admission. Patient is referred to [facility] by pmd due to new onset of jaundice and abnormal CT ab/pel scan which showed intrahepatic biliary ductal dilatation.   -Per MD note possibly cholangitis. -Patient was being wheeled down to endoscopy for ERCP with biopsy and stent when he was noted to go into afib with RVR. Procedure postponed until tomorrow.  Spoke with patient and wife at bedside. Report the patient's appetite is very good and has remained good PTA. He had no N/V or abdominal pain. Report some fecal incontinence. Report intake has been consistent with what he has always eaten. He has 3 meals per day with protein, vegetables, and fruit. They did not learn of weight loss until appointment yesterday. UBW is 170 lbs. Patient has lost 16 lbs (9% body weight) sometime within the past 2 months, which is significant for time frame.  Medications reviewed and include: Novolog sliding scale TID with meals, senna,  NS @ 100 ml/hr.  Labs reviewed: CBG 40-101 since admission, Sodium 132, Alk Phos 523, Albumin 3, AST 169, ALT 148, Total Protein 6, T Bili 4.5.   Nutrition-Focused physical exam completed. Findings are moderate fat depletion, severe muscle depletion, and no edema. Patient appears jaundiced.  Patient meets criteria for severe acute malnutrition in setting of 9% weight loss over 2 months, moderate fat depletion, severe muscle depletion.  Discussed with RN.   Diet Order:  Diet full liquid Room service appropriate? Yes; Fluid consistency: Thin  Skin:  Reviewed, no issues  Last BM:  05/17/2016  Height:   Ht Readings from Last 1 Encounters:  05/17/16 5' 10" (1.778 m)    Weight:   Wt Readings from Last 1 Encounters:  05/17/16 154 lb 1.6 oz (69.9 kg)    Ideal Body Weight:  75.45 kg  BMI:  Body mass index is 22.11 kg/m.  Estimated Nutritional Needs:   Kcal:  1800-2000 (MSJ x 1.3-1.5)  Protein:  90-105 grams (1.3-1.5 grams/kg)  Fluid:  >/= 1.7 L/day (25 ml/kg)  EDUCATION NEEDS:   Education needs addressed  Willey Blade, MS, RD, LDN Pager: [phone] After Hours Pager: [phone]

## 2016-05-19 ENCOUNTER — Inpatient Hospital Stay (HOSPITAL_COMMUNITY): Payer: Medicare Other

## 2016-05-19 DIAGNOSIS — D72829 Elevated white blood cell count, unspecified: Secondary | ICD-10-CM

## 2016-05-19 DIAGNOSIS — K831 Obstruction of bile duct: Secondary | ICD-10-CM

## 2016-05-19 DIAGNOSIS — R935 Abnormal findings on diagnostic imaging of other abdominal regions, including retroperitoneum: Secondary | ICD-10-CM

## 2016-05-19 DIAGNOSIS — I4891 Unspecified atrial fibrillation: Secondary | ICD-10-CM

## 2016-05-19 LAB — PROCALCITONIN: PROCALCITONIN: 2.48 ng/mL

## 2016-05-19 LAB — HEPARIN LEVEL (UNFRACTIONATED): Heparin Unfractionated: 0.47 IU/mL (ref 0.30–0.70)

## 2016-05-19 LAB — BASIC METABOLIC PANEL
Anion gap: 7 (ref 5–15)
BUN: 11 mg/dL (ref 6–20)
CALCIUM: 8 mg/dL — AB (ref 8.9–10.3)
CO2: 23 mmol/L (ref 22–32)
CREATININE: 0.64 mg/dL (ref 0.61–1.24)
Chloride: 103 mmol/L (ref 101–111)
Glucose, Bld: 164 mg/dL — ABNORMAL HIGH (ref 65–99)
Potassium: 3.8 mmol/L (ref 3.5–5.1)
SODIUM: 133 mmol/L — AB (ref 135–145)

## 2016-05-19 LAB — CBC
HCT: 23.7 % — ABNORMAL LOW (ref 39.0–52.0)
Hemoglobin: 8.2 g/dL — ABNORMAL LOW (ref 13.0–17.0)
MCH: 31.3 pg (ref 26.0–34.0)
MCHC: 34.6 g/dL (ref 30.0–36.0)
MCV: 90.5 fL (ref 78.0–100.0)
PLATELETS: 327 10*3/uL (ref 150–400)
RBC: 2.62 MIL/uL — AB (ref 4.22–5.81)
RDW: 14 % (ref 11.5–15.5)
WBC: 8.3 10*3/uL (ref 4.0–10.5)

## 2016-05-19 LAB — ECHOCARDIOGRAM COMPLETE
HEIGHTINCHES: 70 in
WEIGHTICAEL: 2465.6 [oz_av]

## 2016-05-19 LAB — URINE CULTURE

## 2016-05-19 LAB — GLUCOSE, CAPILLARY
GLUCOSE-CAPILLARY: 184 mg/dL — AB (ref 65–99)
GLUCOSE-CAPILLARY: 259 mg/dL — AB (ref 65–99)
Glucose-Capillary: 171 mg/dL — ABNORMAL HIGH (ref 65–99)
Glucose-Capillary: 175 mg/dL — ABNORMAL HIGH (ref 65–99)

## 2016-05-19 MED ORDER — CIPROFLOXACIN HCL 500 MG PO TABS
500.0000 mg | ORAL_TABLET | Freq: Two times a day (BID) | ORAL | Status: DC
  Administered 2016-05-19 – 2016-05-24 (×10): 500 mg via ORAL
  Filled 2016-05-19 (×10): qty 1

## 2016-05-19 MED ORDER — METRONIDAZOLE 500 MG PO TABS
500.0000 mg | ORAL_TABLET | Freq: Three times a day (TID) | ORAL | Status: DC
  Administered 2016-05-19 – 2016-05-24 (×14): 500 mg via ORAL
  Filled 2016-05-19 (×14): qty 1

## 2016-05-19 MED ORDER — DILTIAZEM HCL 30 MG PO TABS
30.0000 mg | ORAL_TABLET | Freq: Four times a day (QID) | ORAL | Status: DC
  Administered 2016-05-19 – 2016-05-20 (×4): 30 mg via ORAL
  Filled 2016-05-19 (×4): qty 1

## 2016-05-19 NOTE — Progress Notes (Signed)
  Echocardiogram 2D Echocardiogram has been performed.  Andrew [facility] 05/19/2016, 10:41 AM

## 2016-05-19 NOTE — Progress Notes (Signed)
Pt heart rate less than 60, Cardizem gtt stopped. Pt heart rate dropping into the 50's and 40's. Jonette Eva on call notified. Will continue to monitor.

## 2016-05-19 NOTE — Progress Notes (Signed)
    Subjective:  Denies CP or dyspnea   Objective:  Vitals:   05/18/16 2104 05/18/16 2359 05/19/16 0240 05/19/16 0433  BP: (!) 137/48  (!) 116/57 (!) 119/53  Pulse: 69 (!) 55  (!) 56  Resp: 18   16  Temp: 97.6 F (36.4 C)   98.3 F (36.8 C)  TempSrc: Oral   Oral  SpO2: 98%   99%  Weight:      Height:        Intake/Output from previous day:  Intake/Output Summary (Last 24 hours) at 05/19/16 [id number] Last data filed at 05/19/16 0600  Gross per 24 hour  Intake          1585.45 ml  Output             2050 ml  Net          -464.55 ml    Physical Exam: Physical exam: Well-developed well-nourished in no acute distress.  Skin is warm and dry.  HEENT is normal.  Neck is supple.  Chest is clear to auscultation with normal expansion.  Cardiovascular exam is regular rate and rhythm.  Abdominal exam nontender or distended. No masses palpated. Extremities show no edema. neuro grossly intact    Lab Results: Basic Metabolic Panel:  Recent Labs  05/17/16 1227 05/18/16 0357  NA 134* 132*  K 6.4* 4.3  CL 100 102  CO2 23 22  GLUCOSE 233* 103*  BUN 25* 18  CREATININE 1.13 0.76  CALCIUM 9.5 8.1*   CBC:  Recent Labs  05/17/16 1227 05/18/16 0357 05/19/16 0401  WBC 20.2 Repeated and verified X2.* 8.8 8.3  NEUTROABS 17.2* 7.0  --   HGB 9.4* 8.0* 8.2*  HCT 28.0* 23.5* 23.7*  MCV 92.6 91.8 90.5  PLT 348.0 272 327     Assessment/Plan:  82 year old male with diabetes, hypertension admitted with new onset jaundice noted to develop atrial fibrillation with rapid ventricular response which was asymptomatic.  1 paroxysmal atrial fibrillation-patient has converted to sinus rhythm. He was transiently bradycardic through the night. Change Cardizem to 30 mg every 6 hours and follow on telemetry. We will consolidate when it is clear his heart rate is stable. Continue heparin. We will convert to oral anticoagulation when all procedures complete.  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 Management per gastroenterology.  3 diabetes mellitus  Kirk Ruths 05/19/2016, 7:27 AM

## 2016-05-19 NOTE — Progress Notes (Signed)
PROGRESS NOTE    Andrew Norton  [id number] DOB: Nov 26, 1926 DOA: 05/17/2016 PCP: Laurey Morale, MD    Brief Narrative:  Andrew Norton is a 82 y.o. male with h/o HTN, noninsulin dependent dm2, chronic constipation, b12 deficiency, neuropathy with gait instability is sent from [facility] for direct admission. Patient is referred to [facility] by pmd due to new onset of jaundice and abnormal CT ab/pel scan which showed "intrahepatic biliary ductal dilatation. No focal hepatic mass. No calcified gallstones are noted within gallbladder. There is significant CBD dilatation up to 1.8 cm. There is abrupt narrowing of CBD at the level of ampulla There is a polypoid lesion at the level of ampulla within duodenum measures about 1.5 cm axial image 47. Ampullary mass cannot be excluded. Further correlation with ERCP is recommended."  Labs done at [facility] office today showed leukocytosis wbc of 20.2, hyperkalemia, k 6.4, bun 25, cr 1.13, elevated lft which is worsening. hospitalist called to direct admit the patient.   Patient says that he has not been feeling well over the past couple of weeks and his wife first notice some mild jaundice about 3 weeks ago. He has decreased appetite, no abdominal pain, no nausea or vomiting. He report decreased urination in the last three weeks. His weight has dropped from 170 to baseline to 158 today. He denies fever or chills, no cough, no chest pain. He walks with a cane during day time and a walker at night time due to chronic gait instability.   Patient potassium corrected over the first night of his admission and he was planned to undergo ERCP on 11/3.   However, at time of the procedure patient was found to be in atrial fibrillation with heart rates into the 160s.  Cardiology was consulted and patient was placed on a diltiazem drip.  Heart rate did lower and patient was occasionally bradycardic overnight from 11/3-11/4.  He was transitioned to oral cardizem on 11/4.    Assessment  & Plan:   Active Problems:   HYPERKALEMIA   Hyperlipidemia   Constipation   Jaundice   Diabetes mellitus type 2, noninsulin dependent (HCC)   LFT elevation   Leukocytosis   Ampulla of Vater mass   Paroxysmal atrial fibrillation (HCC)   Protein-calorie malnutrition, severe   Ampulla lesion with  biliary dilatation, painless  jaundice,  elevation of left,  ERCP on hold 2/2 new onset atrial fibrillation Heparin for now as well as scd's Heart Healthy/ Carb Modified diet ERCP to be scheduled per GI Per GI will try to perform EUS and ERCP at same time  Atrial Fibrillation, new onset Cardiology consulted Transitioned to 30mg  PO Cardizem q6h Rate controlled with diltiazem Echo results pending TSH of 2.356  Leukocytosis:  Improved with Cipro and Flagyl WBC stable Pro- calcitonin of 2.48 Change to PO Cipro and Flagyl today  Hyperkalemia:  Resolved      noninsulin dependent dm2, Recent a1c 7.6 (02/2016)  hold oral meds start ssi CBG well controlled  HTN hold cozaar Diltiazem Blood pressure well controlled   Chronic constipation:  continue stool softener.    DVT prophylaxis: SCD's Code Status: Full Code Family Communication: No family bedside Disposition Plan: pending    Consultants:   [facility]  Procedures:   ERCP date pending  Antimicrobials:   Ciprofloxacin 11/2>  Flagyl 11>2   Subjective: Patient sitting up in bed and just talked with GI.  Says he is feeling well.  Feels good that his heart rate  is "back to normal".  Patient asks if he can eat something more than just full liquid diet.  Reports that he had an "interesting" breakfast.  Denies any chest pain, chest pressure, palpitations, shortness of breath, increased work of breathing.    Objective: Vitals:   05/18/16 2104 05/18/16 2359 05/19/16 0240 05/19/16 0433  BP: (!) 137/48  (!) 116/57 (!) 119/53  Pulse: 69 (!) 55  (!) 56  Resp: 18   16  Temp: 97.6 F (36.4 C)   98.3 F (36.8  C)  TempSrc: Oral   Oral  SpO2: 98%   99%  Weight:      Height:        Intake/Output Summary (Last 24 hours) at 05/19/16 0842 Last data filed at 05/19/16 0600  Gross per 24 hour  Intake          1285.45 ml  Output             1550 ml  Net          -264.55 ml   Filed Weights   05/17/16 1743  Weight: 69.9 kg (154 lb 1.6 oz)    Examination:  General exam: Appears calm and comfortable  Respiratory system: Clear to auscultation. Respiratory effort normal. Cardiovascular system: S1 & S2 heard, IRRR. No JVD, rubs, gallops or clicks. No pedal edema. Gastrointestinal system: Abdomen is nondistended, soft and nontender. No organomegaly or masses felt. Normal bowel sounds heard. Central nervous system: Alert and oriented. No focal neurological deficits. Extremities: Symmetric 5 x 5 power. Skin: No rashes, lesions or ulcers, jaundiced Psychiatry: Judgement and insight appear normal. Mood & affect appropriate.     Data Reviewed: I have personally reviewed following labs and imaging studies  CBC:  Recent Labs Lab 05/17/16 1227 05/18/16 0357 05/19/16 0401  WBC 20.2 Repeated and verified X2.* 8.8 8.3  NEUTROABS 17.2* 7.0  --   HGB 9.4* 8.0* 8.2*  HCT 28.0* 23.5* 23.7*  MCV 92.6 91.8 90.5  PLT 348.0 272 [id number]   Basic Metabolic Panel:  Recent Labs Lab 05/17/16 1227 05/18/16 0357  NA 134* 132*  K 6.4* 4.3  CL 100 102  CO2 23 22  GLUCOSE 233* 103*  BUN 25* 18  CREATININE 1.13 0.76  CALCIUM 9.5 8.1*   GFR: Estimated Creatinine Clearance: 64.3 mL/min (by C-G formula based on SCr of 0.76 mg/dL). Liver Function Tests:  Recent Labs Lab 05/17/16 1227 05/18/16 0357  AST 179* 169*  ALT 155* 148*  ALKPHOS 673* 523*  BILITOT 5.2* 4.5*  PROT 7.0 6.0*  ALBUMIN 3.8 3.0*   No results for input(s): LIPASE, AMYLASE in the last 168 hours. No results for input(s): AMMONIA in the last 168 hours. Coagulation Profile:  Recent Labs Lab 05/17/16 1227 05/18/16 0357  INR 1.2*  1.20   Cardiac Enzymes: No results for input(s): CKTOTAL, CKMB, CKMBINDEX, TROPONINI in the last 168 hours. BNP (last 3 results) No results for input(s): PROBNP in the last 8760 hours. HbA1C: No results for input(s): HGBA1C in the last 72 hours. CBG:  Recent Labs Lab 05/18/16 0819 05/18/16 1130 05/18/16 1658 05/18/16 2102 05/19/16 0739  GLUCAP 76 101* 199* 297* 184*   Lipid Profile: No results for input(s): CHOL, HDL, LDLCALC, TRIG, CHOLHDL, LDLDIRECT in the last 72 hours. Thyroid Function Tests:  Recent Labs  05/18/16 1544  TSH 2.356   Anemia Panel: No results for input(s): VITAMINB12, FOLATE, FERRITIN, TIBC, IRON, RETICCTPCT in the last 72 hours. Sepsis Labs:  Recent Labs Lab  05/17/16 1833 05/17/16 1834 05/17/16 2151 05/18/16 1739 05/19/16 0401  PROCALCITON 10.78  --   --  2.90 2.48  LATICACIDVEN  --  3.0* 1.7  --   --     Recent Results (from the past 240 hour(s))  Culture, blood (routine x 2)     Status: None (Preliminary result)   Collection Time: 05/17/16  6:33 PM  Result Value Ref Range Status   Specimen Description BLOOD RIGHT ARM  Final   Special Requests BOTTLES DRAWN AEROBIC AND ANAEROBIC 5CC  Final   Culture   Final    NO GROWTH < 24 HOURS Performed at [facility]    Report Status PENDING  Incomplete  Culture, blood (routine x 2)     Status: None (Preliminary result)   Collection Time: 05/17/16  6:33 PM  Result Value Ref Range Status   Specimen Description BLOOD LEFT ARM  Final   Special Requests IN PEDIATRIC BOTTLE 4CC  Final   Culture   Final    NO GROWTH < 24 HOURS Performed at [facility]    Report Status PENDING  Incomplete  Urine culture     Status: Abnormal   Collection Time: 05/17/16 10:09 PM  Result Value Ref Range Status   Specimen Description URINE, CLEAN CATCH  Final   Special Requests NONE  Final   Culture MULTIPLE SPECIES PRESENT, SUGGEST RECOLLECTION (A)  Final   Report Status 05/19/2016 FINAL  Final           Radiology Studies: No results found.      Scheduled Meds: . ciprofloxacin  400 mg Intravenous Q12H  . diltiazem  30 mg Oral Q6H  . feeding supplement (ENSURE ENLIVE)  237 mL Oral BID BM  . finasteride  5 mg Oral Daily  . gabapentin  400 mg Oral QID  . insulin aspart  0-9 Units Subcutaneous TID WC  . lidocaine  1 patch Transdermal Q24H  . metronidazole  500 mg Intravenous Q8H  . senna-docusate  1 tablet Oral BID  . sodium chloride flush  3 mL Intravenous Q12H   Continuous Infusions: . heparin 1,400 Units/hr (05/19/16 0817)     LOS: 2 days    Time spent: 30 minutes    Newman Pies, MD [facility] Pager [phone]  If 7PM-7AM, please contact night-coverage [email or web address] Password [facility] 05/19/2016, 8:42 AM

## 2016-05-19 NOTE — Progress Notes (Signed)
ANTICOAGULATION CONSULT NOTE -   Pharmacy Consult for Heparin Indication: new onset atrial fibrillation  Patient Measurements: Height: 5\' 10"  (177.8 cm) Weight: 154 lb 1.6 oz (69.9 kg) IBW/kg (Calculated) : 73 Heparin Dosing Weight: 70kg  Vital Signs: Temp: 98.3 F (36.8 C) (11/04 0433) Temp Source: Oral (11/04 0433) BP: 119/53 (11/04 0433) Pulse Rate: 56 (11/04 0433)  Labs:  Recent Labs  05/17/16 1227 05/18/16 0357 05/18/16 2331 05/19/16 0401 05/19/16 0840  HGB 9.4* 8.0*  --  8.2*  --   HCT 28.0* 23.5*  --  23.7*  --   PLT 348.0 272  --  327  --   LABPROT 12.7 15.3*  --   --   --   INR 1.2* 1.20  --   --   --   HEPARINUNFRC  --   --  0.42  --  0.47  CREATININE 1.13 0.76  --  0.64  --    Estimated Creatinine Clearance: 64.3 mL/min (by C-G formula based on SCr of 0.64 mg/dL).  Medical History: Past Medical History:  Diagnosis Date  . Anemia   . Arthritis    hips  . Atrial fibrillation ([place])   . Carpal tunnel syndrome    left leg sciatica  . Diabetes mellitus   . Hearing loss    left ear greater loss  . History of shingles    past hx. left hand  . Hyperlipidemia   . Hypertension    EKG, chest x ray 11/15/10 EPIC,  clearance Dr Sharlene Motts on chart/ pt states on meds to "prevent hypertension from diabetes"  . Paget's disease    scrotum  . Paroxysmal atrial fibrillation ([place]) 05/18/2016  . Positive PPD, treated    Medications:  Scheduled:  . ciprofloxacin  400 mg Intravenous Q12H  . diltiazem  30 mg Oral Q6H  . feeding supplement (ENSURE ENLIVE)  237 mL Oral BID BM  . finasteride  5 mg Oral Daily  . gabapentin  400 mg Oral QID  . insulin aspart  0-9 Units Subcutaneous TID WC  . lidocaine  1 patch Transdermal Q24H  . metronidazole  500 mg Intravenous Q8H  . senna-docusate  1 tablet Oral BID  . sodium chloride flush  3 mL Intravenous Q12H   Infusions:  . heparin 1,400 Units/hr (05/19/16 0817)   Assessment: 72 yoM with new onset jaundice, direct admit 11/2  from [facility]. CT: intrahepatic biliary ductal dilatation. Planned ERCP 11/3, but developed Afib with procedure cancelled and Heparin infusion started on 11/3.  With patient improving on antibiotics, GI recommends to postpone ERCP until early next week.  Today, 05/19/2016  - Heparin level remains therapeutic at 0.42 with heparin infusing @ 1400 units/hr - cbc remains stable - no bleeding documented - converted to NSR; cards recom to continue with heparin therapy for now and transition to oral anticoagulation when no further invasive interventions are needed   Goal of Therapy:  Heparin level 0.3-0.7 units/ml Monitor platelets by anticoagulation protocol: Yes   Plan:   Continue Heparin drip at 1400 units/hr  Monitor for s/s bleeding   Follow up plan/date for ERCP.  GI Team--> if to take patient to to ERCP, please advise if/when you would like [place] to hold his heparin drip for procedure.   Dia Sitter, PharmD, BCPS 05/19/2016 9:51 AM

## 2016-05-19 NOTE — Progress Notes (Signed)
[facility] Progress Note  Subjective:  Feels good.  No abdominal pain.  No fevers.  Back in NSR.  Objective:  Vital signs in last 24 hours: Temp:  [97.6 F (36.4 C)-98.3 F (36.8 C)] 98.3 F (36.8 C) (11/04 0433) Pulse Rate:  [55-137] 56 (11/04 0433) Resp:  [14-25] 16 (11/04 0433) BP: (116-181)/(48-143) 119/53 (11/04 0433) SpO2:  [97 %-100 %] 99 % (11/04 0433) Last BM Date: 05/17/16 General:  Alert, Well-developed, in NAD.  Jaundice noted. Heart:  Slightly bradycardic. Pulm:  CTAB.  No W/R/R. Abdomen:  Soft, non-distended.  BS present.  Non-tender. Extremities:  Without edema. Neurologic:  Alert and oriented x 4;  grossly normal neurologically. Psych:  Alert and cooperative. Normal mood and affect.  Intake/Output from previous day: 11/03 0701 - 11/04 0700 In: 1585.5 [I.V.:885.5; IV Piggyback:700] Out: 2050 [Urine:2050]  Lab Results:  Recent Labs  05/17/16 1227 05/18/16 0357 05/19/16 0401  WBC 20.2 Repeated and verified X2.* 8.8 8.3  HGB 9.4* 8.0* 8.2*  HCT 28.0* 23.5* 23.7*  PLT 348.0 272 327   BMET  Recent Labs  05/17/16 1227 05/18/16 0357 05/19/16 0401  NA 134* 132* 133*  K 6.4* 4.3 3.8  CL 100 102 103  CO2 23 22 23   GLUCOSE 233* 103* 164*  BUN 25* 18 11  CREATININE 1.13 0.76 0.64  CALCIUM 9.5 8.1* 8.0*   LFT  Recent Labs  05/18/16 0357  PROT 6.0*  ALBUMIN 3.0*  AST 169*  ALT 148*  ALKPHOS 523*  BILITOT 4.5*   PT/INR  Recent Labs  05/17/16 1227 05/18/16 0357  LABPROT 12.7 15.3*  INR 1.2* 1.20   Assessment / Plan: #3 82 year old white male with new onset of jaundice, poor appetite, weight loss, and abnormal CT scan done one week ago showing a dilated common bile duct, intrahepatic ductal dilation, and abrupt narrowing of the common bile duct at the ampulla where there is a polypoid lesion 1.5 cm, 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  Initially concerning for possible cholangitis, but rapidly improved with antibiotics.  ERCP  cancelled 11/3 due to atrial fibrillation with RVR. #2 adult-onset diabetes mellitus #3 peripheral neuropathy #4 hyperkalemia:  Corrected/improved. #5 leukocytosis:  Secondary to above.  Resolved on abx (cipro and flagyl). #6 anemia #7 new onset atrial fibrillation with RVR:  Converted to NSR with diltiazem.  Now on heparin gtt until ERCP/EUS.  *Discussed in detail with patient and his wife regarding reasons for postponing procedures for next week.  Will wait and have Dr. Ardis Hughs perform EUS/ERCP (hopefully early next week) together since he has rapidly improved with antibiotics and will be best served to have the procedures concurrently.  Will allow him to eat throughout the weekend.  Ok to switch abx to PO (shortage of IV flagyl).   LOS: 2 days   ZEHR, JESSICA D.  05/19/2016, 9:49 AM  Pager number [phone]  GI ATTENDING  Interval history data reviewed. Patient personally seen and examined. Family in room. Agree with interval progress note. Patient is feeling well. Back in normal sinus rhythm. No abdominal complaints except for decreased appetite. No pruritus. Discussed with them that leukocytosis may have been spurious. In any event he remains clinically stable. On anticoagulation because of atrial fibrillation. Plan is to keep him in the hospital and perform EUS followed by ERCP in 1 anesthetic sitting. He will need interruption of anticoagulation for the procedure. Fortunately, Dr. Ardis Hughs in hospital this week.  Docia Chuck. Geri Seminole., M.D. [facility] Division of  Gastroenterology

## 2016-05-19 NOTE — Progress Notes (Signed)
ANTICOAGULATION CONSULT NOTE -   Pharmacy Consult for Heparin Indication: atrial fibrillation  Patient Measurements: Height: 5\' 10"  (177.8 cm) Weight: 154 lb 1.6 oz (69.9 kg) IBW/kg (Calculated) : 73 Heparin Dosing Weight: 70kg  Vital Signs: Temp: 97.6 F (36.4 C) (11/03 2104) Temp Source: Oral (11/03 2104) BP: 137/48 (11/03 2104) Pulse Rate: 55 (11/03 2359)  Labs:  Recent Labs  05/17/16 1227 05/18/16 0357 05/18/16 2331  HGB 9.4* 8.0*  --   HCT 28.0* 23.5*  --   PLT 348.0 272  --   LABPROT 12.7 15.3*  --   INR 1.2* 1.20  --   HEPARINUNFRC  --   --  0.42  CREATININE 1.13 0.76  --    Estimated Creatinine Clearance: 64.3 mL/min (by C-G formula based on SCr of 0.76 mg/dL).  Medical History: Past Medical History:  Diagnosis Date  . Anemia   . Arthritis    hips  . Atrial fibrillation ([place])   . Carpal tunnel syndrome    left leg sciatica  . Diabetes mellitus   . Hearing loss    left ear greater loss  . History of shingles    past hx. left hand  . Hyperlipidemia   . Hypertension    EKG, chest x ray 11/15/10 EPIC,  clearance Dr Sharlene Motts on chart/ pt states on meds to "prevent hypertension from diabetes"  . Paget's disease    scrotum  . Paroxysmal atrial fibrillation ([place]) 05/18/2016  . Positive PPD, treated    Medications:  Scheduled:  . ciprofloxacin  400 mg Intravenous Q12H  . diltiazem  60 mg Oral Q6H  . feeding supplement (ENSURE ENLIVE)  237 mL Oral BID BM  . finasteride  5 mg Oral Daily  . gabapentin  400 mg Oral QID  . insulin aspart  0-9 Units Subcutaneous TID WC  . lidocaine  1 patch Transdermal Q24H  . metronidazole  500 mg Intravenous Q8H  . senna-docusate  1 tablet Oral BID  . sodium chloride flush  3 mL Intravenous Q12H   Infusions:  . diltiazem (CARDIZEM) infusion Stopped (05/18/16 2359)  . heparin 1,400 Units/hr (05/18/16 1607)   Assessment: 54 yoM with new onset jaundice, direct admit 11/2 from [facility]. CT: intrahepatic biliary ductal  dilatation. Planned ERCP 11/3, but developed Afib, begin Diltiazem, Heparin infusion/Rx, and postpone ERCP to 11/4  Today, 05/19/2016   Heparin level = 0.42 with heparin infusing @ 1400 units/hr  No complications of therapy noted  Goal of Therapy:  Heparin level 0.3-0.7 units/ml Monitor platelets by anticoagulation protocol: Yes   Plan:   Continue Heparin @ 1400 units/hr  Daily CBC  Repeat heparin level in 8 hr to confirm therapeutic dose   Follow up plans for ERCP 11/4  Leone Haven, PharmD 05/19/2016, 12:06 AM

## 2016-05-20 LAB — CBC
HEMATOCRIT: 27.5 % — AB (ref 39.0–52.0)
Hemoglobin: 9.5 g/dL — ABNORMAL LOW (ref 13.0–17.0)
MCH: 31.4 pg (ref 26.0–34.0)
MCHC: 34.5 g/dL (ref 30.0–36.0)
MCV: 90.8 fL (ref 78.0–100.0)
PLATELETS: 389 10*3/uL (ref 150–400)
RBC: 3.03 MIL/uL — AB (ref 4.22–5.81)
RDW: 14.2 % (ref 11.5–15.5)
WBC: 6.8 10*3/uL (ref 4.0–10.5)

## 2016-05-20 LAB — GLUCOSE, CAPILLARY
GLUCOSE-CAPILLARY: 242 mg/dL — AB (ref 65–99)
GLUCOSE-CAPILLARY: 183 mg/dL — AB (ref 65–99)
GLUCOSE-CAPILLARY: 185 mg/dL — AB (ref 65–99)
Glucose-Capillary: 171 mg/dL — ABNORMAL HIGH (ref 65–99)

## 2016-05-20 LAB — PROCALCITONIN: Procalcitonin: 1.13 ng/mL

## 2016-05-20 LAB — HEPARIN LEVEL (UNFRACTIONATED): Heparin Unfractionated: 0.52 IU/mL (ref 0.30–0.70)

## 2016-05-20 MED ORDER — DILTIAZEM HCL 60 MG PO TABS
60.0000 mg | ORAL_TABLET | Freq: Three times a day (TID) | ORAL | Status: DC
  Administered 2016-05-20 – 2016-05-22 (×6): 60 mg via ORAL
  Filled 2016-05-20 (×6): qty 1

## 2016-05-20 MED ORDER — METOPROLOL TARTRATE 5 MG/5ML IV SOLN: 5.0000 mg | Freq: Four times a day (QID) | INTRAVENOUS | Status: DC | PRN

## 2016-05-20 NOTE — Progress Notes (Signed)
    Subjective:  Denies CP or dyspnea; no palpitations; no dizziness   Objective:  Vitals:   05/19/16 1328 05/19/16 1809 05/19/16 2127 05/20/16 0615  BP: (!) 133/49 (!) 144/44 (!) 173/78 (!) 132/59  Pulse: 69  69 73  Resp: 16  18 16   Temp: 98 F (36.7 C)  98 F (36.7 C) 98.5 F (36.9 C)  TempSrc: Oral  Oral Oral  SpO2: 98%  98% 100%  Weight:      Height:        Intake/Output from previous day:  Intake/Output Summary (Last 24 hours) at 05/20/16 [id number] Last data filed at 05/20/16 0600  Gross per 24 hour  Intake             1056 ml  Output                0 ml  Net             1056 ml    Physical Exam: Physical exam: Well-developed well-nourished in no acute distress.  Skin is warm and dry.  HEENT is normal.  Neck is supple.  Chest is clear to auscultation with normal expansion.  Cardiovascular exam is regular rate and rhythm.  Abdominal exam nontender or distended. No masses palpated. Extremities show no edema. neuro grossly intact    Lab Results: Basic Metabolic Panel:  Recent Labs  05/18/16 0357 05/19/16 0401  NA 132* 133*  K 4.3 3.8  CL 102 103  CO2 22 23  GLUCOSE 103* 164*  BUN 18 11  CREATININE 0.76 0.64  CALCIUM 8.1* 8.0*   CBC:  Recent Labs  05/17/16 1227 05/18/16 0357 05/19/16 0401 05/20/16 0527  WBC 20.2 Repeated and verified X2.* 8.8 8.3 6.8  NEUTROABS 17.2* 7.0  --   --   HGB 9.4* 8.0* 8.2* 9.5*  HCT 28.0* 23.5* 23.7* 27.5*  MCV 92.6 91.8 90.5 90.8  PLT 348.0 272 327 389     Assessment/Plan:  82 year old male with diabetes, hypertension admitted with new onset jaundice noted to develop atrial fibrillation with rapid ventricular response which was asymptomatic. LV function normal on echo.   1 paroxysmal atrial fibrillation-patient noted to have recurrent PAF on telemetry with rapid ventricular response; mild bradycardia when in sinus. Will try cardizem 60 mg po Q 8 hrs. Would like to avoid amiodarone due to ongoing  juandice/elevated LFTS. We will consolidate cardizem later. Continue heparin. We will convert to oral anticoagulation when all procedures complete.  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 Management per gastroenterology.  3 diabetes mellitus  Kirk Ruths 05/20/2016, 7:27 AM

## 2016-05-20 NOTE — Progress Notes (Signed)
PROGRESS NOTE    Andrew Norton  [id number] DOB: [birth year] DOA: 05/17/2016 PCP: Laurey Morale, MD    Brief Narrative:  Andrew Norton is a 82 y.o. male with h/o HTN, noninsulin dependent dm2, chronic constipation, b12 deficiency, neuropathy with gait instability is sent from [place] [facility] for direct admission. Patient is referred to [facility] by pmd due to new onset of jaundice and abnormal CT ab/pel scan which showed "intrahepatic biliary ductal dilatation. No focal hepatic mass. No calcified gallstones are noted within gallbladder. There is significant CBD dilatation up to 1.8 cm. There is abrupt narrowing of CBD at the level of ampulla There is a polypoid lesion at the level of ampulla within duodenum measures about 1.5 cm axial image 47. Ampullary mass cannot be excluded. Further correlation with ERCP is recommended."  Labs done at [facility] office today showed leukocytosis wbc of 20.2, hyperkalemia, k 6.4, bun 25, cr 1.13, elevated lft which is worsening. hospitalist called to direct admit the patient.   Patient says that he has not been feeling well over the past couple of weeks and his wife first notice some mild jaundice about 3 weeks ago. He has decreased appetite, no abdominal pain, no nausea or vomiting. He report decreased urination in the last three weeks. His weight has dropped from 170 to baseline to 158 today. He denies fever or chills, no cough, no chest pain. He walks with a cane during day time and a walker at night time due to chronic gait instability.   Patient potassium corrected over the first night of his admission and he was planned to undergo ERCP on 11/3.   However, at time of the procedure patient was found to be in atrial fibrillation with heart rates into the 160s.  Cardiology was consulted and patient was placed on a diltiazem drip.  Heart rate did lower and patient was occasionally bradycardic overnight from 11/3-11/4.  He was transitioned to oral cardizem on 11/4.    Assessment  & Plan:   Active Problems:   HYPERKALEMIA   Hyperlipidemia   Constipation   Jaundice   Diabetes mellitus type 2, noninsulin dependent (HCC)   LFT elevation   Leukocytosis   Ampulla of Vater mass   Paroxysmal atrial fibrillation (HCC)   Protein-calorie malnutrition, severe   Bile duct obstruction   Abnormal CT of the abdomen   Ampulla lesion with  biliary dilatation, painless  jaundice,  elevation of left,  ERCP on hold 2/2 new onset atrial fibrillation Heparin for now as well as scd's Heart Healthy/ Carb Modified diet ERCP to be scheduled per GI Per GI will try to perform EUS and ERCP at same time  Atrial Fibrillation, new onset Cardiology consulted Transitioned to 60mg  PO Cardizem q6h Rate controlled with diltiazem Echo: Normal LV systolic function; grade 1 diastolic dysfunction; trace AI; mild TR with mildly elevated pulmonary pressure. TSH of 2.356  Leukocytosis:  Improved with Cipro and Flagyl WBC stable Pro- calcitonin of 2.48 Continue PO Cipro and Flagyl  Will repeat CBCD in am  Hyperkalemia:  Resolved Will repeat CMP in now and then again in am      noninsulin dependent dm2, Recent a1c 7.6 (02/2016)  hold oral meds start ssi CBG well controlled  HTN hold cozaar Diltiazem Blood pressure well controlled   Chronic constipation:  continue stool softener.    DVT prophylaxis: SCD's and heparin Code Status: Full Code Family Communication: Granddaughter is bedside Disposition Plan: pending    Consultants:  [facility]  Cardiology  Procedures:   ERCP date pending  Antimicrobials:   Ciprofloxacin 11/2>  Flagyl 11>2   Subjective: Patient being visited by granddaughter.  No chest pain or chest pressure reported.  Eating well.  Nurse reports increase in heart rate with talking or walking to restroom.  Did report a pause noted in telemetry strip.  No nausea, abdominal pain.  Patient did ask for something to help with constipation.  No  bleeding reported. Anxious to find out when ERCP is going to be scheduled.  Objective: Vitals:   05/20/16 0615 05/20/16 1020 05/20/16 1021 05/20/16 1333  BP: (!) 132/59 (!) 148/62 (!) 150/68 129/69  Pulse: 73   (!) 118  Resp: 16   18  Temp: 98.5 F (36.9 C)   98.5 F (36.9 C)  TempSrc: Oral   Oral  SpO2: 100%   96%  Weight:      Height:        Intake/Output Summary (Last 24 hours) at 05/20/16 1438 Last data filed at 05/20/16 1300  Gross per 24 hour  Intake             1056 ml  Output                0 ml  Net             1056 ml   Filed Weights   05/17/16 1743  Weight: 69.9 kg (154 lb 1.6 oz)    Examination:  General exam: Appears calm and comfortable  Respiratory system: Clear to auscultation. Respiratory effort normal. Cardiovascular system: S1 & S2 heard, IRRR. No JVD, rubs, gallops or clicks. No pedal edema. Gastrointestinal system: Abdomen is nondistended, soft and nontender. No organomegaly or masses felt. Normal bowel sounds heard. Central nervous system: Alert and oriented. No focal neurological deficits. Extremities: Symmetric 5 x 5 power. Skin: No rashes, lesions or ulcers, jaundiced Psychiatry: Judgement and insight appear normal. Mood & affect appropriate.     Data Reviewed: I have personally reviewed following labs and imaging studies  CBC:  Recent Labs Lab 05/17/16 1227 05/18/16 0357 05/19/16 0401 05/20/16 0527  WBC 20.2 Repeated and verified X2.* 8.8 8.3 6.8  NEUTROABS 17.2* 7.0  --   --   HGB 9.4* 8.0* 8.2* 9.5*  HCT 28.0* 23.5* 23.7* 27.5*  MCV 92.6 91.8 90.5 90.8  PLT 348.0 272 327 [id number]   Basic Metabolic Panel:  Recent Labs Lab 05/17/16 1227 05/18/16 0357 05/19/16 0401  NA 134* 132* 133*  K 6.4* 4.3 3.8  CL 100 102 103  CO2 23 22 23   GLUCOSE 233* 103* 164*  BUN 25* 18 11  CREATININE 1.13 0.76 0.64  CALCIUM 9.5 8.1* 8.0*   GFR: Estimated Creatinine Clearance: 64.3 mL/min (by C-G formula based on SCr of 0.64 mg/dL). Liver  Function Tests:  Recent Labs Lab 05/17/16 1227 05/18/16 0357  AST 179* 169*  ALT 155* 148*  ALKPHOS 673* 523*  BILITOT 5.2* 4.5*  PROT 7.0 6.0*  ALBUMIN 3.8 3.0*   No results for input(s): LIPASE, AMYLASE in the last 168 hours. No results for input(s): AMMONIA in the last 168 hours. Coagulation Profile:  Recent Labs Lab 05/17/16 1227 05/18/16 0357  INR 1.2* 1.20   Cardiac Enzymes: No results for input(s): CKTOTAL, CKMB, CKMBINDEX, TROPONINI in the last 168 hours. BNP (last 3 results) No results for input(s): PROBNP in the last 8760 hours. HbA1C: No results for input(s): HGBA1C in the last 72 hours. CBG:  Recent Labs Lab  05/19/16 1138 05/19/16 1656 05/19/16 2124 05/20/16 0753 05/20/16 1156  GLUCAP 259* 171* 175* 171* 242*   Lipid Profile: No results for input(s): CHOL, HDL, LDLCALC, TRIG, CHOLHDL, LDLDIRECT in the last 72 hours. Thyroid Function Tests:  Recent Labs  05/18/16 1544  TSH 2.356   Anemia Panel: No results for input(s): VITAMINB12, FOLATE, FERRITIN, TIBC, IRON, RETICCTPCT in the last 72 hours. Sepsis Labs:  Recent Labs Lab 05/17/16 1833 05/17/16 1834 05/17/16 2151 05/18/16 1739 05/19/16 0401 05/20/16 0527  PROCALCITON 10.78  --   --  2.90 2.48 1.13  LATICACIDVEN  --  3.0* 1.7  --   --   --     Recent Results (from the past 240 hour(s))  Culture, blood (routine x 2)     Status: None (Preliminary result)   Collection Time: 05/17/16  6:33 PM  Result Value Ref Range Status   Specimen Description BLOOD RIGHT ARM  Final   Special Requests BOTTLES DRAWN AEROBIC AND ANAEROBIC 5CC  Final   Culture   Final    NO GROWTH 3 DAYS Performed at [facility]    Report Status PENDING  Incomplete  Culture, blood (routine x 2)     Status: None (Preliminary result)   Collection Time: 05/17/16  6:33 PM  Result Value Ref Range Status   Specimen Description BLOOD LEFT ARM  Final   Special Requests IN PEDIATRIC BOTTLE 4CC  Final   Culture    Final    NO GROWTH 3 DAYS Performed at [facility]    Report Status PENDING  Incomplete  Urine culture     Status: Abnormal   Collection Time: 05/17/16 10:09 PM  Result Value Ref Range Status   Specimen Description URINE, CLEAN CATCH  Final   Special Requests NONE  Final   Culture MULTIPLE SPECIES PRESENT, SUGGEST RECOLLECTION (A)  Final   Report Status 05/19/2016 FINAL  Final         Radiology Studies: No results found.      Scheduled Meds: . ciprofloxacin  500 mg Oral BID  . diltiazem  60 mg Oral Q8H  . feeding supplement (ENSURE ENLIVE)  237 mL Oral BID BM  . finasteride  5 mg Oral Daily  . gabapentin  400 mg Oral QID  . insulin aspart  0-9 Units Subcutaneous TID WC  . lidocaine  1 patch Transdermal Q24H  . metroNIDAZOLE  500 mg Oral Q8H  . senna-docusate  1 tablet Oral BID  . sodium chloride flush  3 mL Intravenous Q12H   Continuous Infusions: . heparin 1,400 Units/hr (05/19/16 1500)     LOS: 3 days    Time spent: 30 minutes    Newman Pies, MD [facility] Pager [phone]  If 7PM-7AM, please contact night-coverage [email or web address] Password [facility] 05/20/2016, 2:38 PM

## 2016-05-20 NOTE — Progress Notes (Signed)
     [facility] Progress Note  Subjective:  Feels the same today.  No complaints.    Objective:  Vital signs in last 24 hours: Temp:  [98 F (36.7 C)-98.5 F (36.9 C)] 98.5 F (36.9 C) (11/05 0615) Pulse Rate:  [69-73] 73 (11/05 0615) Resp:  [16-18] 16 (11/05 0615) BP: (132-173)/(44-78) 132/59 (11/05 0615) SpO2:  [98 %-100 %] 100 % (11/05 0615) Last BM Date: 05/17/16 General:  Alert, elderly, in NAD; jaundice noted.  Temporal wasting noted. Heart:  Regular rate and rhythm Pulm:  CTAB.  No W/R/R. Abdomen:  Soft, non-distended.  BS present.  Non-tender.  Extremities:  Without edema. Neurologic:  Alert and oriented x 4;  grossly normal neurologically. Psych:  Alert and cooperative. Normal mood and affect.  Intake/Output from previous day: 11/04 0701 - 11/05 0700 In: 1056 [P.O.:720; I.V.:336] Out: -    Lab Results:  Recent Labs  05/18/16 0357 05/19/16 0401 05/20/16 0527  WBC 8.8 8.3 6.8  HGB 8.0* 8.2* 9.5*  HCT 23.5* 23.7* 27.5*  PLT 272 327 389   BMET  Recent Labs  05/17/16 1227 05/18/16 0357 05/19/16 0401  NA 134* 132* 133*  K 6.4* 4.3 3.8  CL 100 102 103  CO2 23 22 23   GLUCOSE 233* 103* 164*  BUN 25* 18 11  CREATININE 1.13 0.76 0.64  CALCIUM 9.5 8.1* 8.0*   LFT  Recent Labs  05/18/16 0357  PROT 6.0*  ALBUMIN 3.0*  AST 169*  ALT 148*  ALKPHOS 523*  BILITOT 4.5*   PT/INR  Recent Labs  05/17/16 1227 05/18/16 0357  LABPROT 12.7 15.3*  INR 1.2* 1.20   Assessment / Plan: #58 82 year old white male with new onset of jaundice, poor appetite, weight loss,and abnormal CT scan done one week ago showing a dilated common bile duct, intrahepatic ductal dilation,and abrupt narrowing of the common bile duct at the ampulla where there is a polypoid lesion 1.5 cm, 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  Initially concerning for possible cholangitis, but rapidly improved with antibiotics.  ERCP cancelled 11/3 due to atrial fibrillation with RVR. #2  adult-onset diabetes mellitus #3 peripheral neuropathy #4hyperkalemia: Corrected/improved. #5 leukocytosis: Secondary to above. Resolved on abx (cipro and flagyl).  ? If that result was spurious. #6 anemia:  Hgb improved some this AM. #7 new onset atrial fibrillation with RVR:  Converted to NSR with diltiazem.  Now on heparin gtt until ERCP/EUS.  Plan to switch to PO anticoagulation once GI procedures complete.  *Discussed in detail with patient and his wife regarding reasons for postponing procedures for next week.  Will wait and have Dr. Ardis Hughs perform EUS/ERCP (hopefully early next week) together since he has rapidly improved with antibiotics and will be best served to have the procedures concurrently.  Will allow him to eat throughout the weekend.  Switched to PO abx on 11/4 due to shortage of IV flagyl.  Heparin will need to be held for procedure once we determine when that will occur. *Will recheck CMP in AM.   LOS: 3 days   ZEHR, JESSICA D.  05/20/2016, 8:43 AM  Pager number [phone]  GI ATTENDING  Interval history data reviewed. Agree with interval progress note as outlined. Patient stable. For EUS/ERCP with biopsies this week with Dr. Ardis Hughs. Patient aware.  Docia Chuck. Geri Seminole., M.D. [facility] Division of Gastroenterology

## 2016-05-20 NOTE — Progress Notes (Signed)
ANTICOAGULATION CONSULT NOTE -   Pharmacy Consult for Heparin Indication: new onset atrial fibrillation  Patient Measurements: Height: 5\' 10"  (177.8 cm) Weight: 154 lb 1.6 oz (69.9 kg) IBW/kg (Calculated) : 73 Heparin Dosing Weight: 70kg  Vital Signs: Temp: 98.5 F (36.9 C) (11/05 0615) Temp Source: Oral (11/05 0615) BP: 132/59 (11/05 0615) Pulse Rate: 73 (11/05 0615)  Labs:  Recent Labs  05/17/16 1227 05/18/16 0357 05/18/16 2331 05/19/16 0401 05/19/16 0840 05/20/16 0527  HGB 9.4* 8.0*  --  8.2*  --  9.5*  HCT 28.0* 23.5*  --  23.7*  --  27.5*  PLT 348.0 272  --  327  --  389  LABPROT 12.7 15.3*  --   --   --   --   INR 1.2* 1.20  --   --   --   --   HEPARINUNFRC  --   --  0.42  --  0.47 0.52  CREATININE 1.13 0.76  --  0.64  --   --    Estimated Creatinine Clearance: 64.3 mL/min (by C-G formula based on SCr of 0.64 mg/dL).  Medical History: Past Medical History:  Diagnosis Date  . Anemia   . Arthritis    hips  . Atrial fibrillation ([place])   . Carpal tunnel syndrome    left leg sciatica  . Diabetes mellitus   . Hearing loss    left ear greater loss  . History of shingles    past hx. left hand  . Hyperlipidemia   . Hypertension    EKG, chest x ray 11/15/10 EPIC,  clearance Dr Sharlene Motts on chart/ pt states on meds to "prevent hypertension from diabetes"  . Paget's disease    scrotum  . Paroxysmal atrial fibrillation ([place]) 05/18/2016  . Positive PPD, treated    Medications:  Scheduled:  . ciprofloxacin  500 mg Oral BID  . diltiazem  60 mg Oral Q8H  . feeding supplement (ENSURE ENLIVE)  237 mL Oral BID BM  . finasteride  5 mg Oral Daily  . gabapentin  400 mg Oral QID  . insulin aspart  0-9 Units Subcutaneous TID WC  . lidocaine  1 patch Transdermal Q24H  . metroNIDAZOLE  500 mg Oral Q8H  . senna-docusate  1 tablet Oral BID  . sodium chloride flush  3 mL Intravenous Q12H   Infusions:  . heparin 1,400 Units/hr (05/19/16 1500)   Assessment: 34 yoM with new  onset jaundice, direct admit 11/2 from [facility]. CT: intrahepatic biliary ductal dilatation. Planned ERCP 11/3, but developed Afib with procedure cancelled and Heparin infusion started on 11/3.  With patient improving on antibiotics, GI recommends postponing ERCP until early next week.  Today, 05/20/2016  - Heparin level remains therapeutic at 0.52 with heparin infusing @ 1400 units/hr (RN reported some IV infiltration and leakage this morning but likely occurred after labs were drawn; some blood noted at infiltration site but has resolved) - cbc remains stable - pt now back to PAF; cards recom to continue with heparin therapy for now and transition to oral anticoagulation when no further invasive interventions are needed   Goal of Therapy:  Heparin level 0.3-0.7 units/ml Monitor platelets by anticoagulation protocol: Yes   Plan:   Continue Heparin drip at 1400 units/hr  Monitor for s/s bleeding   Follow up plan/date for ERCP.  GI Team--> if to take patient to ERCP, please advise if/when you would like [place] to hold his heparin drip for procedure.   Clemmie Buelna  Devin Going, PharmD, BCPS 05/20/2016 9:10 AM

## 2016-05-20 NOTE — Progress Notes (Signed)
Patient in an out of A.fib. When patient ambulates his HR will increase to 140 and convert to a.fib. Patient asymptomatic, no DOE. When patient returns to bed and lays down his HR will convert back to NSR. Cardiologist notified, prn orders for metoprolol added. Patient also had 4.6 sec pause before converting to NSR, reported to cardiologist.  [name]. Brigitte Pulse, RN

## 2016-05-20 NOTE — Progress Notes (Signed)
Patient ambulated to bathroom and sat on side of bed eating dinner and remained in NSR (HR- 70) . [name]. Brigitte Pulse, RN

## 2016-05-21 DIAGNOSIS — R17 Unspecified jaundice: Secondary | ICD-10-CM

## 2016-05-21 DIAGNOSIS — I455 Other specified heart block: Secondary | ICD-10-CM

## 2016-05-21 DIAGNOSIS — I495 Sick sinus syndrome: Secondary | ICD-10-CM

## 2016-05-21 DIAGNOSIS — I472 Ventricular tachycardia: Secondary | ICD-10-CM

## 2016-05-21 DIAGNOSIS — K831 Obstruction of bile duct: Secondary | ICD-10-CM

## 2016-05-21 DIAGNOSIS — I4729 Other ventricular tachycardia: Secondary | ICD-10-CM

## 2016-05-21 LAB — COMPREHENSIVE METABOLIC PANEL
ALT: 105 U/L — AB (ref 17–63)
AST: 88 U/L — AB (ref 15–41)
Albumin: 3.1 g/dL — ABNORMAL LOW (ref 3.5–5.0)
Alkaline Phosphatase: 454 U/L — ABNORMAL HIGH (ref 38–126)
Anion gap: 10 (ref 5–15)
BILIRUBIN TOTAL: 6.1 mg/dL — AB (ref 0.3–1.2)
BUN: 16 mg/dL (ref 6–20)
CO2: 24 mmol/L (ref 22–32)
CREATININE: 0.6 mg/dL — AB (ref 0.61–1.24)
Calcium: 8.8 mg/dL — ABNORMAL LOW (ref 8.9–10.3)
Chloride: 101 mmol/L (ref 101–111)
Glucose, Bld: 202 mg/dL — ABNORMAL HIGH (ref 65–99)
POTASSIUM: 3.9 mmol/L (ref 3.5–5.1)
Sodium: 135 mmol/L (ref 135–145)
TOTAL PROTEIN: 6.5 g/dL (ref 6.5–8.1)

## 2016-05-21 LAB — CBC
HCT: 26.3 % — ABNORMAL LOW (ref 39.0–52.0)
Hemoglobin: 9.1 g/dL — ABNORMAL LOW (ref 13.0–17.0)
MCH: 31.4 pg (ref 26.0–34.0)
MCHC: 34.6 g/dL (ref 30.0–36.0)
MCV: 90.7 fL (ref 78.0–100.0)
PLATELETS: 342 10*3/uL (ref 150–400)
RBC: 2.9 MIL/uL — AB (ref 4.22–5.81)
RDW: 14.5 % (ref 11.5–15.5)
WBC: 8.6 10*3/uL (ref 4.0–10.5)

## 2016-05-21 LAB — GLUCOSE, CAPILLARY
GLUCOSE-CAPILLARY: 217 mg/dL — AB (ref 65–99)
GLUCOSE-CAPILLARY: 382 mg/dL — AB (ref 65–99)
Glucose-Capillary: 192 mg/dL — ABNORMAL HIGH (ref 65–99)
Glucose-Capillary: 218 mg/dL — ABNORMAL HIGH (ref 65–99)

## 2016-05-21 LAB — HEPARIN LEVEL (UNFRACTIONATED): Heparin Unfractionated: 0.52 IU/mL (ref 0.30–0.70)

## 2016-05-21 LAB — MAGNESIUM: MAGNESIUM: 1.4 mg/dL — AB (ref 1.7–2.4)

## 2016-05-21 LAB — PROCALCITONIN: Procalcitonin: 0.73 ng/mL

## 2016-05-21 MED ORDER — MAGNESIUM SULFATE 2 GM/50ML IV SOLN
2.0000 g | Freq: Once | INTRAVENOUS | Status: AC
  Administered 2016-05-21: 2 g via INTRAVENOUS
  Filled 2016-05-21: qty 50

## 2016-05-21 NOTE — Progress Notes (Signed)
Patient Name: Andrew Norton Date of Encounter: 05/21/2016  Primary Cardiologist: Dr. Sande Rives Problem List     Principal Problem:   Bile duct obstruction Active Problems:   HYPERKALEMIA   Hyperlipidemia   Constipation   Jaundice   Diabetes mellitus type 2, noninsulin dependent (HCC)   LFT elevation   Leukocytosis   Ampulla of Vater mass   Paroxysmal atrial fibrillation (HCC)   Protein-calorie malnutrition, severe   Abnormal CT of the abdomen   Tachy-brady syndrome (HCC)   Sinus pause    Subjective   Asymptomatic. No CP, SOB, dizziness. He does not recall having any sx at time of post-conversion pause yesterday or intermittent bradycardia. He does recall in the past feeling a strange sensation of fleeting dizziness but has no hx of syncope.  Inpatient Medications    . ciprofloxacin  500 mg Oral BID  . diltiazem  60 mg Oral Q8H  . feeding supplement (ENSURE ENLIVE)  237 mL Oral BID BM  . finasteride  5 mg Oral Daily  . gabapentin  400 mg Oral QID  . insulin aspart  0-9 Units Subcutaneous TID WC  . lidocaine  1 patch Transdermal Q24H  . metroNIDAZOLE  500 mg Oral Q8H  . senna-docusate  1 tablet Oral BID  . sodium chloride flush  3 mL Intravenous Q12H    Vital Signs    Vitals:   05/20/16 1021 05/20/16 1333 05/20/16 2107 05/21/16 0538  BP: (!) 150/68 129/69 139/66 (!) 153/73  Pulse:  (!) 118 67 73  Resp:  18 18 18   Temp:  98.5 F (36.9 C) 98.2 F (36.8 C) 97.3 F (36.3 C)  TempSrc:  Oral Oral Oral  SpO2:  96% 100% 100%  Weight:      Height:        Intake/Output Summary (Last 24 hours) at 05/21/16 1001 Last data filed at 05/21/16 0600  Gross per 24 hour  Intake              760 ml  Output                0 ml  Net              760 ml   Filed Weights   05/17/16 1743  Weight: 154 lb 1.6 oz (69.9 kg)    Physical Exam    General: Thin elderly WM in no acute distress. HEENT: Normocephalic, atraumatic, sclera non-icteric, no xanthomas, nares  are without discharge. Neck: JVP not elevated. Lungs: Clear bilaterally to auscultation without wheezes, rales, or rhonchi. Breathing is unlabored. Cardiac: RRR S1 S2 without murmurs, rubs, or gallops.  Abdomen: Soft, non-tender, non-distended with normoactive bowel sounds. No rebound/guarding. Extremities: No clubbing or cyanosis. No edema. Distal pedal pulses are 2+ and equal bilaterally. Skin: Warm and dry, no significant rash. Neuro: Alert and oriented X 3. Sensation in tact. Follows commands. Psych:  Responds to questions appropriately with a normal affect.  Labs    CBC  Recent Labs  05/20/16 0527 05/21/16 0340  WBC 6.8 8.6  HGB 9.5* 9.1*  HCT 27.5* 26.3*  MCV 90.8 90.7  PLT 389 [id number]   Basic Metabolic Panel  Recent Labs  05/19/16 0401 05/21/16 0340  NA 133* 135  K 3.8 3.9  CL 103 101  CO2 23 24  GLUCOSE 164* 202*  BUN 11 16  CREATININE 0.64 0.60*  CALCIUM 8.0* 8.8*   Liver Function Tests  Recent Labs  05/21/16 0340  AST 88*  ALT 105*  ALKPHOS 454*  BILITOT 6.1*  PROT 6.5  ALBUMIN 3.1*   Thyroid Function Tests  Recent Labs  05/18/16 1544  TSH 2.356    Telemetry    Paroxysmal afib and NSR/junctional rhythm - yesterday had intermittent sinus pauses followed by junctional bradycardia with HR upper 30s-low 40s (longest pause was 5sec around 2:40pm), brief NSVT (3-6 beats)  Radiology    Ct Abdomen Pelvis W Contrast  Result Date: 05/11/2016 CLINICAL DATA:  Jaundice for about 2 weeks, abdominal swelling EXAM: CT ABDOMEN AND PELVIS WITH CONTRAST TECHNIQUE: Multidetector CT imaging of the abdomen and pelvis was performed using the standard protocol following bolus administration of intravenous contrast. CONTRAST:  15mL ISOVUE-300 IOPAMIDOL (ISOVUE-300) INJECTION 61% COMPARISON:  None. FINDINGS: Lower chest: Lung bases shows mild peripheral interstitial prominence probable probable chronic in nature. No segmental infiltrate. Hepatobiliary: There is  intrahepatic biliary ductal dilatation. No focal hepatic mass. No calcified gallstones are noted within gallbladder. There is significant CBD dilatation up to 1.8 cm. There is abrupt narrowing of CBD at the level of ampulla There is a polypoid lesion at the level of ampulla within duodenum measures about 1.5 cm axial image 47. Ampullary mass cannot be excluded. Further correlation with ERCP is recommended. Pancreas: There is fatty replaced pancreas. No focal pancreatic mass. No dilatation of pancreatic duct. Spleen: Enhanced spleen is unremarkable. Adrenals/Urinary Tract: No adrenal gland mass. Mild for renal cortical thinning and lobulated contour probable due to atrophy. Bilateral renal symmetrical enhancement. Delayed renal images shows bilateral renal symmetrical excretion. Bilateral visualized proximal ureter is unremarkable. Stomach/Bowel: There is no small bowel obstruction. Oral contrast material was given to the patient. Some liquid stool noted in right colon. No pericecal inflammation. The terminal ileum is unremarkable. Liquid stool noted in transverse colon and descending colon. No evidence of obstructing mass. Moderate stool noted within redundant sigmoid colon. Moderate stool noted within rectum. No definite colonic obstruction. No colitis or acute diverticulitis. Vascular/Lymphatic: Mild atherosclerotic calcifications of distal abdominal aorta. No aortic aneurysm. No retroperitoneal or mesenteric adenopathy. Reproductive: Prostate gland is unremarkable. No bladder filling defects are noted. There are metallic artifacts from left hip prosthesis. Other: No ascites or free abdominal air.  No inguinal adenopathy. Musculoskeletal: No destructive bony lesions are noted. Sagittal images of the spine shows diffuse osteopenia. Degenerative changes lumbar spine. There is mild to moderate compression deformity L1 vertebral body of indeterminate age. Clinical correlation is necessary. IMPRESSION: 1. There is  intrahepatic biliary ductal dilatation. No focal hepatic mass. No calcified gallstones are noted within gallbladder. There is significant CBD dilatation up to 1.8 cm. There is abrupt narrowing of CBD at the level of ampulla There is a polypoid lesion at the level of ampulla within duodenum measures about 1.5 cm axial image 47. Ampullary mass cannot be excluded. Further correlation with ERCP is recommended. 2. Moderate colonic stool in distal colon. No definite evidence of acute colitis or diverticulitis. 3. No small bowel obstruction. 4. Metallic artifacts from left hip prosthesis. Compression deformity L1 vertebral body of indeterminate age. Clinical correlation is necessary. Electronically Signed   By: Lahoma Crocker M.D.   On: 05/11/2016 16:58    Patient Profile     82 year old male with DM, HTN, cervical myelopathy with gait instability admitted with new onset jaundice noted to develop atrial fibrillation with rapid ventricular response which was asymptomatic (paroxysms of AF/NSR on tele this adm). 2D Echo 05/19/16: mild LVH, EF 55-60%, no RWMa, grade 1 DD, mildly ?  PASP 52. 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 CHADSVASC 4. Tele remarkable for occasional nonsustained bradycardia with in sinus, intermittent post-conversion pauses (up to 5 sec on 05/20/16), AF RVR, brief NSVT.  Assessment & Plan    1. 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 I reviewed the patient's telemetry with Dr. Tamala Julian - the patient will need to be on telemetry monitoring while procedure is being done and he will need external pads in place as a precaution in case he develops bradycardia during the procedure. Will need to continue to monitor for today for stability.  2. Paroxysmal atrial fib with RVR with probable tachy-brady syndrome - yesterday (05/20/16) he had a 5 sec post-conversion pause (a few shorter ones as well)  followed by junctional rhythm, AF RVR 140s, and brief sinus bradycardia into the 40s - for the last 14 hours he has remained largely in NSR with normal HR in the 60s-70s with occ PVCs. Reviewed with Dr. Tamala Julian. Will continue current regimen cautiously for now. If any further bradycardia will need to decrease diltiazem dose. Amiodarone is being avoided given his abnormal LFTs and jaundice. Add Mg to labs. He is being maintained on parenteral anticoagulation until all procedures are complete.  3. NSVT - add Mg to labs.  4. Chronic appearing anemia - will need to be followed closely while on anticoagulation. Denies any bleeding.  5. HTN - follow for now given intermittent bradycardia.  Signed, Charlie Pitter, PA-C  The patient has been seen in conjunction with Melina Copa, PA-C. All aspects of care have been considered and discussed. The patient has been personally interviewed, examined, and all clinical data has been reviewed.   I agree with the content of this note and the recommendations as listed.  We have decided to continue the current intensity of therapy hoping to control ventricular response of recurrent atrial fibrillation. Hopefully the patient will remain in sinus rhythm and the risk of postconversion bradycardia will be minimal.  Baseline EKGs did not demonstrate A-V conduction system abnormality. Pauses are likely related to sinus node dysfunction.  IV anticoagulation as noted.  We will continue to follow with you. If recurrent episodes of atrial fib with significant post conversion bradycardia, permanent pacemaker therapy may be needed.

## 2016-05-21 NOTE — Progress Notes (Signed)
Inpatient Diabetes Program Recommendations  AACE/ADA: New Consensus Statement on Inpatient Glycemic Control (2015)  Target Ranges:  Prepandial:   less than 140 mg/dL      Peak postprandial:   less than 180 mg/dL (1-2 hours)      Critically ill patients:  140 - 180 mg/dL   Lab Results  Component Value Date   GLUCAP 217 (H) 05/21/2016   HGBA1C 7.6 (H) 03/07/2016    Review of Glycemic Control  Diabetes history: DM 2 Outpatient Diabetes medications: Glipizide 10 mg BID, Metformin 1,000 mg BID, Januvia 100 mg Daily Current orders for Inpatient glycemic control: Novolog Sensitive TID  Inpatient Diabetes Program Recommendations:   Glucose slightly above inpatient goal of <180 mg/dl. Please consider increasing correction to Novolog Moderate (0-15 units) TID.  Thanks,  Tama Headings RN, MSN, [facility] Inpatient Diabetes Coordinator Team Pager [phone] (8a-5p)

## 2016-05-21 NOTE — Progress Notes (Signed)
ANTICOAGULATION CONSULT NOTE -   Pharmacy Consult for Heparin Indication: new onset atrial fibrillation  Patient Measurements: Height: 5\' 10"  (177.8 cm) Weight: 154 lb 1.6 oz (69.9 kg) IBW/kg (Calculated) : 73 Heparin Dosing Weight: 70kg  Vital Signs: Temp: 97.3 F (36.3 C) (11/06 0538) Temp Source: Oral (11/06 0538) BP: 153/73 (11/06 0538) Pulse Rate: 73 (11/06 0538)  Labs:  Recent Labs  05/19/16 0401 05/19/16 0840 05/20/16 0527 05/21/16 0340  HGB 8.2*  --  9.5* 9.1*  HCT 23.7*  --  27.5* 26.3*  PLT 327  --  389 342  HEPARINUNFRC  --  0.47 0.52 0.52  CREATININE 0.64  --   --  0.60*   Estimated Creatinine Clearance: 64.3 mL/min (by C-G formula based on SCr of 0.6 mg/dL (L)).  Medical History: Past Medical History:  Diagnosis Date  . Anemia   . Arthritis    hips  . Atrial fibrillation ([place])   . Carpal tunnel syndrome    left leg sciatica  . Diabetes mellitus   . Hearing loss    left ear greater loss  . History of shingles    past hx. left hand  . Hyperlipidemia   . Hypertension    EKG, chest x ray 11/15/10 EPIC,  clearance Dr Sharlene Motts on chart/ pt states on meds to "prevent hypertension from diabetes"  . Paget's disease    scrotum  . Paroxysmal atrial fibrillation ([place]) 05/18/2016  . Positive PPD, treated    Medications:  Scheduled:  . ciprofloxacin  500 mg Oral BID  . diltiazem  60 mg Oral Q8H  . feeding supplement (ENSURE ENLIVE)  237 mL Oral BID BM  . finasteride  5 mg Oral Daily  . gabapentin  400 mg Oral QID  . insulin aspart  0-9 Units Subcutaneous TID WC  . lidocaine  1 patch Transdermal Q24H  . metroNIDAZOLE  500 mg Oral Q8H  . senna-docusate  1 tablet Oral BID  . sodium chloride flush  3 mL Intravenous Q12H   Infusions:  . heparin 1,400 Units/hr (05/20/16 1803)   Assessment: 87 YOM with new onset jaundice who was directly admitted on 11/2 from [facility]. CT revealed intrahepatic biliary ductal dilatation. Planned ERCP 11/3, but developed new  onset Afib and the procedure was cancelled and Heparin infusion started on 11/3. The patient is rate controlled on Cardizem.   Today, 05/21/2016  - Heparin level remains therapeutic at 0.52 with heparin infusing @ 1400 units/hr. - ERCP rescheduled for Tuesday, 11/7 at 12:30PM. Per GI note, Heparin to be held starting at 6:30AM.  - No bleeding noted today  - CBC remains stable - Patient in NSR - Eating 50% of meals - Cards recom to continue with heparin therapy for now and transition to oral anticoagulation when no further invasive interventions are needed   Goal of Therapy:  Heparin level 0.3-0.7 units/ml Monitor platelets by anticoagulation protocol: Yes   Plan:   Continue Heparin drip at 1400 units/hr  Hold Heparin drip at 0630 tomorrow, 11/7  Monitor for s/s bleeding   Monitor CBC and heparin level    Sallyanne Havers Student-PharmD 05/21/2016 7:36 AM

## 2016-05-21 NOTE — Progress Notes (Signed)
Nutrition Follow-up  DOCUMENTATION CODES:   Severe malnutrition in context of acute illness/injury  INTERVENTION:  Advance diet per MD following ERCP/EUS tomorrow.  Continue Ensure Enlive po BID, each supplement provides 350 kcal and 20 grams of protein.  Encouraged ongoing intake of adequate calories and protein at meals.   NUTRITION DIAGNOSIS:   Increased nutrient needs related to catabolic illness (cholangitis) as evidenced by estimated needs.  Ongoing.  GOAL:   Patient will meet greater than or equal to 90% of their needs  Not met.   MONITOR:   PO intake, Supplement acceptance, Labs, Weight trends, I & O's  REASON FOR ASSESSMENT:   Malnutrition Screening Tool    ASSESSMENT:   82 y.o. male with history of HTN, noninsulin dependent dm2, chronic constipation, B12 deficiency, neuropathy with gait instability is sent from [facility] for direct admission. Patient is referred to [facility] by pmd due to new onset of jaundice and abnormal CT ab/pel scan which showed intrahepatic biliary ductal dilatation.  -ERCP remains on hold due to atrial fibrillation. Plan for ERCP/EUS tomorrow. -Patient was advanced to [place] modified diet on 11/4.   Patient reports his appetite remains "fairly good." He reports he has been able to finish most of his meals. Breakfast tray present at time of assessment and patient had finished everything except the potatoes. Denies N/V or abdominal pain. Still having constipation. Patient reports he has not been receiving the Ensure. He got one the first day, but has not had any since.   Meal Completion: 50-100% per chart In the past 24 hours, patient has had 1242 kcal (69% minimum estimated kcal needs) and 66 grams protein (73% minimum estimated protein needs).   Medications reviewed and include: Novolog sliding scale TID with meals, senna.  Labs reviewed: CBG 183-242 past 24 hrs, Creatinine 0.6 (trending down), Alk Phos 454 (trending down), Albumin 3.1,  AST 88 (trending down), ALT 105 (trending down), T Bili 6.1 (trending up).   No other weights since admission to trend.  Diet Order:  Diet heart healthy/carb modified Room service appropriate? Yes; Fluid consistency: Thin Diet NPO time specified  Skin:  Reviewed, no issues  Last BM:  05/17/2016  Height:   Ht Readings from Last 1 Encounters:  05/17/16 _0  (1.778 m)    Weight:   Wt Readings from Last 1 Encounters:  05/17/16 154 lb 1.6 oz (69.9 kg)    Ideal Body Weight:  75.45 kg  BMI:  Body mass index is 22.11 kg/m.  Estimated Nutritional Needs:   Kcal:  1800-2000 (MSJ x 1.3-1.5)  Protein:  90-105 grams (1.3-1.5 grams/kg)  Fluid:  >/= 1.7 L/day (25 ml/kg)  EDUCATION NEEDS:   Education needs addressed  Willey Blade, MS, RD, LDN Pager: [phone] After Hours Pager: [phone]

## 2016-05-21 NOTE — Care Management Important Message (Signed)
Important Message  Patient Details  Name: COURTNEY REIFSNYDER MRN: [id number] Date of Birth: September 20, 1926   Medicare Important Message Given:  Yes    Camillo Flaming 05/21/2016, 10:45 AMImportant Message  Patient Details  Name: DAMEK BRAZILE MRN: [id number] Date of Birth: [birth year]   Medicare Important Message Given:  Yes    Camillo Flaming 05/21/2016, 10:45 AM

## 2016-05-21 NOTE — Progress Notes (Signed)
PROGRESS NOTE    Andrew Norton  [id number] DOB: Apr 10, 1927 DOA: 05/17/2016 PCP: Laurey Morale, MD    Brief Narrative:  Andrew Norton is a 82 y.o. male with h/o HTN, noninsulin dependent dm2, chronic constipation, b12 deficiency, neuropathy with gait instability is sent from [place] [facility] for direct admission. Patient is referred to [facility] by pmd due to new onset of jaundice and abnormal CT ab/pel scan which showed "intrahepatic biliary ductal dilatation. No focal hepatic mass. No calcified gallstones are noted within gallbladder. There is significant CBD dilatation up to 1.8 cm. There is abrupt narrowing of CBD at the level of ampulla There is a polypoid lesion at the level of ampulla within duodenum measures about 1.5 cm axial image 47. Ampullary mass cannot be excluded. Further correlation with ERCP is recommended."  Labs done at [facility] office today showed leukocytosis wbc of 20.2, hyperkalemia, k 6.4, bun 25, cr 1.13, elevated lft which is worsening. hospitalist called to direct admit the patient.   Patient says that he has not been feeling well over the past couple of weeks and his wife first notice some mild jaundice about 3 weeks ago. He has decreased appetite, no abdominal pain, no nausea or vomiting. He report decreased urination in the last three weeks. His weight has dropped from 170 to baseline to 158 today. He denies fever or chills, no cough, no chest pain. He walks with a cane during day time and a walker at night time due to chronic gait instability.   Patient potassium corrected over the first night of his admission and he was planned to undergo ERCP on 11/3.   However, at time of the procedure patient was found to be in atrial fibrillation with heart rates into the 160s.  Cardiology was consulted and patient was placed on a diltiazem drip.  Heart rate did lower and patient was occasionally bradycardic overnight from 11/3-11/4.  He was transitioned to oral cardizem on 11/4.  Transitioned  from IV to PO antibiotics on 11/4.  Plan for ERCP on 11/7.   Assessment & Plan:   Principal Problem:   Bile duct obstruction Active Problems:   HYPERKALEMIA   Hyperlipidemia   Constipation   Jaundice   Diabetes mellitus type 2, noninsulin dependent (HCC)   LFT elevation   Leukocytosis   Ampulla of Vater mass   Paroxysmal atrial fibrillation (HCC)   Protein-calorie malnutrition, severe   Abnormal CT of the abdomen   Tachy-brady syndrome (HCC)   Sinus pause   NSVT (nonsustained ventricular tachycardia) ([place])   Ampulla lesion with  biliary dilatation, painless  jaundice,  elevation of left,  ERCP on hold 2/2 new onset atrial fibrillation Heparin for now as well as scd's Heart Healthy/ Carb Modified diet ERCP to be scheduled per GI Per GI will try to perform EUS and ERCP at same time  Atrial Fibrillation, new onset Cardiology consulted Transitioned to 60mg  PO Cardizem q6h Rate controlled with diltiazem Echo: Normal LV systolic function; grade 1 diastolic dysfunction; trace AI; mild TR with mildly elevated pulmonary pressure. TSH of 2.356  Leukocytosis:  Improved with Cipro and Flagyl WBC stable Pro- calcitonin of 2.48 Continue PO Cipro and Flagyl  Will repeat CBCD in am  Hyperkalemia:  Resolved Mg level at 1.4- will order 2g IV Mg     noninsulin dependent dm2, Recent a1c 7.6 (02/2016)  hold oral meds start ssi CBG elevated today- transition to moderate sliding scale  HTN hold cozaar Diltiazem per cardiology Blood pressure  well controlled   Chronic constipation:  continue stool softener.    DVT prophylaxis: SCD's and heparin Code Status: Full Code Family Communication: no family bedside Disposition Plan:  Can likely discharge when stable and anticoagulation decided upon after procedure- possibly in the next 48 hours   Consultants:   GI Cardiology  Procedures:   ERCP planned for 11/7  Antimicrobials:   Ciprofloxacin 11/2>  Flagyl  11>2   Subjective: Patient feeling well.  Says he was told his procedures will be tomorrow and he anxious to get them done.  Would like to ambulate in the halls and requesting PT eval for that.  Patient reports that he has been eating well.  Does not report any palpitations or feeling of heart fluttering.    Objective: Vitals:   05/20/16 1021 05/20/16 1333 05/20/16 2107 05/21/16 0538  BP: (!) 150/68 129/69 139/66 (!) 153/73  Pulse:  (!) 118 67 73  Resp:  18 18 18   Temp:  98.5 F (36.9 C) 98.2 F (36.8 C) 97.3 F (36.3 C)  TempSrc:  Oral Oral Oral  SpO2:  96% 100% 100%  Weight:      Height:        Intake/Output Summary (Last 24 hours) at 05/21/16 1439 Last data filed at 05/21/16 0600  Gross per 24 hour  Intake              520 ml  Output                0 ml  Net              520 ml   Filed Weights   05/17/16 1743  Weight: 69.9 kg (154 lb 1.6 oz)    Examination:  General exam: Appears calm and comfortable  Respiratory system: Clear to auscultation. Respiratory effort normal. Cardiovascular system: S1 & S2 heard, RRR. No JVD, rubs, gallops or clicks. No pedal edema. Gastrointestinal system: Abdomen is nondistended, soft and nontender. No organomegaly or masses felt. Normal bowel sounds heard. Central nervous system: Alert and oriented. No focal neurological deficits. Extremities: Symmetric 5 x 5 power. Skin: No rashes, lesions or ulcers, jaundiced Psychiatry: Judgement and insight appear normal. Mood & affect appropriate.     Data Reviewed: I have personally reviewed following labs and imaging studies  CBC:  Recent Labs Lab 05/17/16 1227 05/18/16 0357 05/19/16 0401 05/20/16 0527 05/21/16 0340  WBC 20.2 Repeated and verified X2.* 8.8 8.3 6.8 8.6  NEUTROABS 17.2* 7.0  --   --   --   HGB 9.4* 8.0* 8.2* 9.5* 9.1*  HCT 28.0* 23.5* 23.7* 27.5* 26.3*  MCV 92.6 91.8 90.5 90.8 90.7  PLT 348.0 272 327 389 [id number]   Basic Metabolic Panel:  Recent Labs Lab  05/17/16 1227 05/18/16 0357 05/19/16 0401 05/21/16 0340  NA 134* 132* 133* 135  K 6.4* 4.3 3.8 3.9  CL 100 102 103 101  CO2 23 22 23 24   GLUCOSE 233* 103* 164* 202*  BUN 25* 18 11 16   CREATININE 1.13 0.76 0.64 0.60*  CALCIUM 9.5 8.1* 8.0* 8.8*  MG  --   --   --  1.4*   GFR: Estimated Creatinine Clearance: 64.3 mL/min (by C-G formula based on SCr of 0.6 mg/dL (L)). Liver Function Tests:  Recent Labs Lab 05/17/16 1227 05/18/16 0357 05/21/16 0340  AST 179* 169* 88*  ALT 155* 148* 105*  ALKPHOS 673* 523* 454*  BILITOT 5.2* 4.5* 6.1*  PROT 7.0 6.0* 6.5  ALBUMIN  3.8 3.0* 3.1*   No results for input(s): LIPASE, AMYLASE in the last 168 hours. No results for input(s): AMMONIA in the last 168 hours. Coagulation Profile:  Recent Labs Lab 05/17/16 1227 05/18/16 0357  INR 1.2* 1.20   Cardiac Enzymes: No results for input(s): CKTOTAL, CKMB, CKMBINDEX, TROPONINI in the last 168 hours. BNP (last 3 results) No results for input(s): PROBNP in the last 8760 hours. HbA1C: No results for input(s): HGBA1C in the last 72 hours. CBG:  Recent Labs Lab 05/20/16 1156 05/20/16 1719 05/20/16 2105 05/21/16 0809 05/21/16 1233  GLUCAP 242* 183* 185* 217* 382*   Lipid Profile: No results for input(s): CHOL, HDL, LDLCALC, TRIG, CHOLHDL, LDLDIRECT in the last 72 hours. Thyroid Function Tests:  Recent Labs  05/18/16 1544  TSH 2.356   Anemia Panel: No results for input(s): VITAMINB12, FOLATE, FERRITIN, TIBC, IRON, RETICCTPCT in the last 72 hours. Sepsis Labs:  Recent Labs Lab 05/17/16 1834 05/17/16 2151 05/18/16 1739 05/19/16 0401 05/20/16 0527 05/21/16 0340  PROCALCITON  --   --  2.90 2.48 1.13 0.73  LATICACIDVEN 3.0* 1.7  --   --   --   --     Recent Results (from the past 240 hour(s))  Culture, blood (routine x 2)     Status: None (Preliminary result)   Collection Time: 05/17/16  6:33 PM  Result Value Ref Range Status   Specimen Description BLOOD RIGHT ARM  Final    Special Requests BOTTLES DRAWN AEROBIC AND ANAEROBIC 5CC  Final   Culture   Final    NO GROWTH 4 DAYS Performed at [facility]    Report Status PENDING  Incomplete  Culture, blood (routine x 2)     Status: None (Preliminary result)   Collection Time: 05/17/16  6:33 PM  Result Value Ref Range Status   Specimen Description BLOOD LEFT ARM  Final   Special Requests IN PEDIATRIC BOTTLE 4CC  Final   Culture   Final    NO GROWTH 4 DAYS Performed at [facility]    Report Status PENDING  Incomplete  Urine culture     Status: Abnormal   Collection Time: 05/17/16 10:09 PM  Result Value Ref Range Status   Specimen Description URINE, CLEAN CATCH  Final   Special Requests NONE  Final   Culture MULTIPLE SPECIES PRESENT, SUGGEST RECOLLECTION (A)  Final   Report Status 05/19/2016 FINAL  Final         Radiology Studies: No results found.      Scheduled Meds: . ciprofloxacin  500 mg Oral BID  . diltiazem  60 mg Oral Q8H  . feeding supplement (ENSURE ENLIVE)  237 mL Oral BID BM  . finasteride  5 mg Oral Daily  . gabapentin  400 mg Oral QID  . insulin aspart  0-9 Units Subcutaneous TID WC  . lidocaine  1 patch Transdermal Q24H  . metroNIDAZOLE  500 mg Oral Q8H  . senna-docusate  1 tablet Oral BID  . sodium chloride flush  3 mL Intravenous Q12H   Continuous Infusions: . heparin 1,400 Units/hr (05/21/16 1249)     LOS: 4 days    Time spent: 30 minutes    Newman Pies, MD [facility] Pager [phone]  If 7PM-7AM, please contact night-coverage [email or web address] Password [facility] 05/21/2016, 2:39 PM

## 2016-05-21 NOTE — Progress Notes (Signed)
**Andrew Norton De-Identified via Obfuscation** Andrew Andrew Norton    Since last GI Andrew Norton: This is the first time I am meeting Andrew Andrew Norton. He is laying in bed, about to eat breakfast.  No nausea, vomiting, chest pains.  Objective: Vital signs in last 24 hours: Temp:  [97.3 F (36.3 C)-98.5 F (36.9 C)] 97.3 F (36.3 C) (11/06 0538) Pulse Rate:  [67-118] 73 (11/06 0538) Resp:  [18] 18 (11/06 0538) BP: (129-153)/(62-73) 153/73 (11/06 0538) SpO2:  [96 %-100 %] 100 % (11/06 0538) Last BM Date: 05/17/16 General: alert and oriented times 3, mild scleral icterus Heart: regular rate and rythm Abdomen: soft, non-tender, non-distended, normal bowel sounds   Lab Results:  Recent Labs  05/19/16 0401 05/20/16 0527 05/21/16 0340  WBC 8.3 6.8 8.6  HGB 8.2* 9.5* 9.1*  PLT 327 389 342  MCV 90.5 90.8 90.7    Recent Labs  05/19/16 0401 05/21/16 0340  NA 133* 135  K 3.8 3.9  CL 103 101  CO2 23 24  GLUCOSE 164* 202*  BUN 11 16  CREATININE 0.64 0.60*  CALCIUM 8.0* 8.8*    Recent Labs  05/21/16 0340  PROT 6.5  ALBUMIN 3.1*  AST 88*  ALT 105*  ALKPHOS 454*  BILITOT 6.1*    Medications: Scheduled Meds: . ciprofloxacin  500 mg Oral BID  . diltiazem  60 mg Oral Q8H  . feeding supplement (ENSURE ENLIVE)  237 mL Oral BID BM  . finasteride  5 mg Oral Daily  . gabapentin  400 mg Oral QID  . insulin aspart  0-9 Units Subcutaneous TID WC  . lidocaine  1 patch Transdermal Q24H  . metroNIDAZOLE  500 mg Oral Q8H  . senna-docusate  1 tablet Oral BID  . sodium chloride flush  3 mL Intravenous Q12H   Continuous Infusions: . heparin 1,400 Units/hr (05/20/16 1803)   PRN Meds:.cyclobenzaprine, hydrALAZINE, HYDROcodone-acetaminophen, metoprolol    Assessment/Plan: 82 y.o. male with painless jaundice, biliary obstruction, duodenal mass described on CT  CT scan images reviewed. It looks like he has a duodenal lesion that is obstructing his distal bile duct.  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  He needs biliary decompression and biopsy of the duodenal lesion.  ERCP alone may accomplish both those goals (placement of metal bile duct stent and mucosal biopsies).  Will be prepared for EUS if needed (i.e. if it doesn't look like mucosal biopsies will be adequate to make a clear diagnosis).  I explained the above to him and when his wife arrives I'm happy to talk with her as well.  Currently it looks like 12:30pm start time in [facility] endoscopy.  I've made him NPO after MN.    Primary service or cardiology to please hold heparin drip around 6:30 please.  Please alert me if cardiology does not feel it is safe to proceed tomorrow; his rate seems controlled well on the TID oral Ca Channel blocker and so we'll get ready for the procedures tomorrow.  He should ambulate in halls. Will probably need help with this but he needs to get up out of bed.    Milus Banister, MD  05/21/2016, 7:55 AM [facility] Pager [phone]

## 2016-05-22 ENCOUNTER — Inpatient Hospital Stay (HOSPITAL_COMMUNITY): Payer: Medicare Other

## 2016-05-22 ENCOUNTER — Encounter (HOSPITAL_COMMUNITY)
Admission: AD | Disposition: A | Discharge status: Rehabilitation Facility | Payer: Self-pay | Source: Ambulatory Visit | Attending: Internal Medicine

## 2016-05-22 ENCOUNTER — Inpatient Hospital Stay (HOSPITAL_COMMUNITY): Payer: Medicare Other | Admitting: Anesthesiology

## 2016-05-22 ENCOUNTER — Encounter (HOSPITAL_COMMUNITY): Payer: Self-pay

## 2016-05-22 HISTORY — PX: ERCP: SHX5425

## 2016-05-22 LAB — GLUCOSE, CAPILLARY
GLUCOSE-CAPILLARY: 204 mg/dL — AB (ref 65–99)
GLUCOSE-CAPILLARY: 247 mg/dL — AB (ref 65–99)
Glucose-Capillary: 297 mg/dL — ABNORMAL HIGH (ref 65–99)

## 2016-05-22 LAB — MAGNESIUM: Magnesium: 1.8 mg/dL (ref 1.7–2.4)

## 2016-05-22 LAB — BASIC METABOLIC PANEL
ANION GAP: 11 (ref 5–15)
BUN: 20 mg/dL (ref 6–20)
CALCIUM: 8.5 mg/dL — AB (ref 8.9–10.3)
CO2: 23 mmol/L (ref 22–32)
Chloride: 99 mmol/L — ABNORMAL LOW (ref 101–111)
Creatinine, Ser: 0.72 mg/dL (ref 0.61–1.24)
Glucose, Bld: 217 mg/dL — ABNORMAL HIGH (ref 65–99)
POTASSIUM: 3.7 mmol/L (ref 3.5–5.1)
Sodium: 133 mmol/L — ABNORMAL LOW (ref 135–145)

## 2016-05-22 LAB — CBC
HEMATOCRIT: 25.6 % — AB (ref 39.0–52.0)
HEMOGLOBIN: 9 g/dL — AB (ref 13.0–17.0)
MCH: 31.6 pg (ref 26.0–34.0)
MCHC: 35.2 g/dL (ref 30.0–36.0)
MCV: 89.8 fL (ref 78.0–100.0)
PLATELETS: 291 10*3/uL (ref 150–400)
RBC: 2.85 MIL/uL — AB (ref 4.22–5.81)
RDW: 14.6 % (ref 11.5–15.5)
WBC: 9.5 10*3/uL (ref 4.0–10.5)

## 2016-05-22 LAB — HEPARIN LEVEL (UNFRACTIONATED): Heparin Unfractionated: 0.5 IU/mL (ref 0.30–0.70)

## 2016-05-22 LAB — CULTURE, BLOOD (ROUTINE X 2)
Culture: NO GROWTH
Culture: NO GROWTH

## 2016-05-22 LAB — PROCALCITONIN: Procalcitonin: 0.79 ng/mL

## 2016-05-22 SURGERY — ERCP, WITH INTERVENTION IF INDICATED
Anesthesia: General

## 2016-05-22 MED ORDER — LIDOCAINE 2% (20 MG/ML) 5 ML SYRINGE
INTRAMUSCULAR | Status: AC
  Filled 2016-05-22: qty 5

## 2016-05-22 MED ORDER — LACTATED RINGERS IV SOLN: INTRAVENOUS | Status: DC

## 2016-05-22 MED ORDER — INSULIN ASPART 100 UNIT/ML ~~LOC~~ SOLN
0.0000 [IU] | Freq: Three times a day (TID) | SUBCUTANEOUS | Status: DC
  Administered 2016-05-22 – 2016-05-23 (×2): 5 [IU] via SUBCUTANEOUS
  Administered 2016-05-23 (×2): 3 [IU] via SUBCUTANEOUS
  Administered 2016-05-24: 11 [IU] via SUBCUTANEOUS
  Administered 2016-05-24: 5 [IU] via SUBCUTANEOUS
  Administered 2016-05-24: 11 [IU] via SUBCUTANEOUS
  Administered 2016-05-25: 5 [IU] via SUBCUTANEOUS

## 2016-05-22 MED ORDER — HEPARIN (PORCINE) IN NACL 100-0.45 UNIT/ML-% IJ SOLN
1500.0000 [IU]/h | INTRAMUSCULAR | Status: DC
  Administered 2016-05-23: 1400 [IU]/h via INTRAVENOUS
  Filled 2016-05-22 (×2): qty 250

## 2016-05-22 MED ORDER — SUCCINYLCHOLINE CHLORIDE 20 MG/ML IJ SOLN
INTRAMUSCULAR | Status: DC | PRN
  Administered 2016-05-22: 80 mg via INTRAVENOUS

## 2016-05-22 MED ORDER — INDOMETHACIN 50 MG RE SUPP
RECTAL | Status: AC
  Filled 2016-05-22: qty 2

## 2016-05-22 MED ORDER — PROPOFOL 10 MG/ML IV BOLUS
INTRAVENOUS | Status: AC
  Filled 2016-05-22: qty 20

## 2016-05-22 MED ORDER — CIPROFLOXACIN IN D5W 400 MG/200ML IV SOLN
400.0000 mg | Freq: Once | INTRAVENOUS | Status: AC
  Administered 2016-05-22: 400 mg via INTRAVENOUS

## 2016-05-22 MED ORDER — PROPOFOL 10 MG/ML IV BOLUS
INTRAVENOUS | Status: DC | PRN
  Administered 2016-05-22: 20 mg via INTRAVENOUS
  Administered 2016-05-22: 30 mg via INTRAVENOUS
  Administered 2016-05-22: 20 mg via INTRAVENOUS
  Administered 2016-05-22: 80 mg via INTRAVENOUS

## 2016-05-22 MED ORDER — LIDOCAINE HCL (CARDIAC) 20 MG/ML IV SOLN
INTRAVENOUS | Status: DC | PRN
  Administered 2016-05-22: 25 mg via INTRATRACHEAL
  Administered 2016-05-22: 40 mg via INTRAVENOUS

## 2016-05-22 MED ORDER — INDOMETHACIN 50 MG RE SUPP
100.0000 mg | Freq: Once | RECTAL | Status: AC
  Administered 2016-05-22: 100 mg via RECTAL

## 2016-05-22 MED ORDER — SODIUM CHLORIDE 0.9 % IV SOLN: INTRAVENOUS | Status: DC

## 2016-05-22 MED ORDER — CIPROFLOXACIN IN D5W 400 MG/200ML IV SOLN
INTRAVENOUS | Status: AC
  Filled 2016-05-22: qty 200

## 2016-05-22 MED ORDER — MAGNESIUM OXIDE 400 (241.3 MG) MG PO TABS
400.0000 mg | ORAL_TABLET | Freq: Every day | ORAL | Status: DC
  Administered 2016-05-23 – 2016-05-30 (×8): 400 mg via ORAL
  Filled 2016-05-22 (×8): qty 1

## 2016-05-22 MED ORDER — SODIUM CHLORIDE 0.9 % IV SOLN: INTRAVENOUS | Status: DC | PRN

## 2016-05-22 MED ORDER — SUCCINYLCHOLINE CHLORIDE 20 MG/ML IJ SOLN
INTRAMUSCULAR | Status: AC
  Filled 2016-05-22: qty 1

## 2016-05-22 MED ORDER — GLYCOPYRROLATE 0.2 MG/ML IV SOSY
PREFILLED_SYRINGE | INTRAVENOUS | Status: AC
  Filled 2016-05-22: qty 3

## 2016-05-22 MED ORDER — FENTANYL CITRATE (PF) 250 MCG/5ML IJ SOLN
INTRAMUSCULAR | Status: DC | PRN
  Administered 2016-05-22 (×4): 25 ug via INTRAVENOUS

## 2016-05-22 MED ORDER — PHENYLEPHRINE HCL 10 MG/ML IJ SOLN
INTRAMUSCULAR | Status: DC | PRN
  Administered 2016-05-22 (×5): 40 ug via INTRAVENOUS

## 2016-05-22 MED ORDER — LACTATED RINGERS IV SOLN
INTRAVENOUS | Status: DC | PRN
  Administered 2016-05-22 (×2): via INTRAVENOUS

## 2016-05-22 MED ORDER — FENTANYL CITRATE (PF) 100 MCG/2ML IJ SOLN
INTRAMUSCULAR | Status: AC
  Filled 2016-05-22: qty 2

## 2016-05-22 MED ORDER — SODIUM CHLORIDE 0.9 % IV SOLN
INTRAVENOUS | Status: DC | PRN
  Administered 2016-05-22: 40 mL

## 2016-05-22 NOTE — Op Note (Addendum)
[facility] Patient Name: Andrew Norton Procedure Date: 05/22/2016 MRN: [id number] Attending MD: Milus Banister , MD Date of Birth: February 10, 1927 CSN: [id number] Age: 82 Admit Type: Inpatient Procedure:                ERCP Indications:              Abnormal abdominal CT, Biliary dilation on Computed                            Tomogram Scan, ?duodenal mass on CT; jaundice Providers:                Milus Banister, MD, Elmer Ramp. Tilden Dome, RN, Elspeth Cho Tech., Technician, Enrigue Catena, CRNA Referring MD:              Medicines:                General Anesthesia, Cipro 400 mg IV, Indomethacin                            123XX123 mg PR Complications:            No immediate complications. Estimated blood loss:                            None Estimated Blood Loss:     Estimated blood loss: none. Procedure:                Pre-Anesthesia Assessment:                           - Prior to the procedure, a History and Physical                            was performed, and patient medications and                            allergies were reviewed. The patient's tolerance of                            previous anesthesia was also reviewed. The risks                            and benefits of the procedure and the sedation                            options and risks were discussed with the patient.                            All questions were answered, and informed consent                            was obtained. Prior Anticoagulants: The patient has  taken heparin, last dose was 1 day prior to                            procedure. ASA Grade Assessment: IV - A patient                            with severe systemic disease that is a constant                            threat to life. After reviewing the risks and                            benefits, the patient was deemed in satisfactory                            condition to undergo the  procedure.                           After obtaining informed consent, the scope was                            passed under direct vision. Throughout the                            procedure, the patient's blood pressure, pulse, and                            oxygen saturations were monitored continuously. The                            was introduced through the mouth, and used to                            inject contrast into and used to inject contrast                            into the bile duct and ventral pancreatic duct. The                            ERCP was accomplished without difficulty. The                            patient tolerated the procedure well. Scope In: Scope Out: Findings:      The scout film was normal. There was very tortuous upper esophageal       anatomy, ? Zenkers diverticulum. I could not advanced the duodenoscope       into the esophagus initially. I used a standard adult gastroscope to       place a Savory wire deeply into his stomach, confirmed tortuous upper       esophageal anatomy (still ? for Zenkers but this was not obvious). Using       fluoro views I passed the duodenoscope over the wire into his stomach       without further  difficulty. The scope was advanced to the major papilla.       The ampulla was very bulging and there was sludge oozing from the       orifice. A .035 hydrawire was used to cannulate the bile duct. An       identical wire was temporarily placed into the main pancreatic duct to       facilitate biliary cannulation. Cholangiogram showed a round distal CBD       defect, this did not appear mobile and I though it was possibly a       neoplasm (pedunculated). The extrahepatic bile duct was diffusely       dilated (CBD 12-56mm). I performed biliary sphincterotomy and then       balloon sweeping with 12-46mm biliary balloon. A large (1.2cm), round,       dark bile duct stone was delivered into the duodenum. There were no        further obvious stones in the biliary tree on repeat cholangiogram. I       was concerned about the mucosa in the exposed distal CBD, it was       somewhat noeplastic appearing circumferentially and so I used standard       forceps to biopsy this ( +/- firm mucosa on biopsy). Given his overall       frailty, ?underlying neoplastic mucosa in the distal CBD wall I placed a       4cm long, covered SEMS across the ampulla. There was no waisting and on       probing with the scope tip and forceps it was clear that the stent was       too mobile, likely to migrate and so I removed it. Impression:               - Very tortuous proximal esophagus, ?Zenkers. See                            above.                           - Dilated extrahepatic bile duct containing a                            single spherical 1.2cm stone, removed after biliary                            sphincterotomy and balloon extraction                           - Circumferentially abnormal distal bile duct                            mucosa (exposed broadly after sphincterotomy), ?                            neoplam. This was biopsied with forceps.                           - Temporary insertion of 4cm covered SEMS, it was  clear that there was no 'waist' and that the stent                            was very mobile after placement; highly likely to                            migrate so I removed it. Moderate Sedation:      N/A- Per Anesthesia Care Recommendation:           - Return patient to hospital ward for ongoing care.                           - Follow LFTS, await path results.                           - Cardiac care.                           - Given the long sphincerotomy and biopsies, I                            prefer that heparin be held until tomorrow AM. Procedure Code(s):        --- Professional ---                           [id number], Endoscopic retrograde                             cholangiopancreatography (ERCP); with removal and                            exchange of stent(s), biliary or pancreatic duct,                            including pre- and post-dilation and guide wire                            passage, when performed, including sphincterotomy,                            when performed, each stent exchanged                           43264, Endoscopic retrograde                            cholangiopancreatography (ERCP); with removal of                            calculi/debris from biliary/pancreatic duct(s)                           43261, Endoscopic retrograde                            cholangiopancreatography (ERCP); with biopsy,  single or multiple Diagnosis Code(s):        --- Professional ---                           K80.50, Calculus of bile duct without cholangitis                            or cholecystitis without obstruction                           Z46.59, Encounter for fitting and adjustment of                            other gastrointestinal appliance and device                           R93.5, Abnormal findings on diagnostic imaging of                            other abdominal regions, including retroperitoneum                           K83.8, Other specified diseases of biliary tract CPT copyright 2016 [employer]. All rights reserved. The codes documented in this report are preliminary and upon coder review may  be revised to meet current compliance requirements. Milus Banister, MD 05/22/2016 2:39:43 PM This report has been signed electronically. Number of Addenda: 0

## 2016-05-22 NOTE — Progress Notes (Signed)
PT Cancellation Note  Patient Details Name: Andrew Norton MRN: [id number] DOB: 1927/01/22   Cancelled Treatment:    Reason Eval/Treat Not Completed: Patient at procedure or test/unavailable (endoscopy)   [facility] 05/22/2016, 12:05 PM

## 2016-05-22 NOTE — Progress Notes (Signed)
PROGRESS NOTE    Andrew Norton  [id number] DOB: 08/30/1926 DOA: 05/17/2016 PCP: Laurey Morale, MD    Brief Narrative:  Andrew Norton is a 82 y.o. male with h/o HTN, noninsulin dependent dm2, chronic constipation, b12 deficiency, neuropathy with gait instability is sent from [facility] for direct admission. Patient is referred to [facility] by pmd due to new onset of jaundice and abnormal CT ab/pel scan which showed "intrahepatic biliary ductal dilatation. No focal hepatic mass. No calcified gallstones are noted within gallbladder. There is significant CBD dilatation up to 1.8 cm. There is abrupt narrowing of CBD at the level of ampulla There is a polypoid lesion at the level of ampulla within duodenum measures about 1.5 cm axial image 47. Ampullary mass cannot be excluded. Further correlation with ERCP is recommended."  Labs done at [facility] office today showed leukocytosis wbc of 20.2, hyperkalemia, k 6.4, bun 25, cr 1.13, elevated lft which is worsening. hospitalist called to direct admit the patient.   Patient says that he has not been feeling well over the past couple of weeks and his wife first notice some mild jaundice about 3 weeks ago. He has decreased appetite, no abdominal pain, no nausea or vomiting. He report decreased urination in the last three weeks. His weight has dropped from 170 to baseline to 158 today. He denies fever or chills, no cough, no chest pain. He walks with a cane during day time and a walker at night time due to chronic gait instability.   Patient potassium corrected over the first night of his admission and he was planned to undergo ERCP on 11/3.   However, at time of the procedure patient was found to be in atrial fibrillation with heart rates into the 160s.  Cardiology was consulted and patient was placed on a diltiazem drip.  Heart rate did lower and patient was occasionally bradycardic overnight from 11/3-11/4.  He was transitioned to oral cardizem on 11/4.  Transitioned  from IV to PO antibiotics on 11/4.  Plan for ERCP on 11/7.   Assessment & Plan:   Principal Problem:   Bile duct obstruction Active Problems:   HYPERKALEMIA   Hyperlipidemia   Constipation   Jaundice   Diabetes mellitus type 2, noninsulin dependent (HCC)   LFT elevation   Leukocytosis   Ampulla of Vater mass   Paroxysmal atrial fibrillation (HCC)   Protein-calorie malnutrition, severe   Abnormal CT of the abdomen   Tachy-brady syndrome (HCC)   Sinus pause   NSVT (nonsustained ventricular tachycardia) ([place])   Ampulla lesion with  biliary dilatation, painless  jaundice,  elevation of left,  ERCP on hold 2/2 new onset atrial fibrillation Heparin for now as well as scd's Heart Healthy/ Carb Modified diet ERCP scheduled for today  Atrial Fibrillation, new onset Cardiology consulted Transitioned to 60mg  PO Cardizem q6h Rate controlled with diltiazem Echo: Normal LV systolic function; grade 1 diastolic dysfunction; trace AI; mild TR with mildly elevated pulmonary pressure. TSH of 2.356  Leukocytosis:  Improved with Cipro and Flagyl WBC stable Pro- calcitonin of 2.48 Continue PO Cipro and Flagyl  WBC of 9.5 this am  Hyperkalemia:  Resolved Mg level at 1.8 today     noninsulin dependent dm2, Recent a1c 7.6 (02/2016)  hold oral meds start ssi CBG elevated today- transition to moderate sliding scale  HTN hold cozaar Diltiazem per cardiology Blood pressure well controlled   Chronic constipation:  continue stool softener.    DVT prophylaxis: SCD's and heparin  Code Status: Full Code Family Communication: no family bedside Disposition Plan:  Can likely discharge when stable and anticoagulation decided upon after procedure- possibly in the next 48 hours- await cardiology and gastroenterology recommendations   Consultants:  [facility]  Procedures:   ERCP today  Antimicrobials:   Ciprofloxacin 11/2>  Flagyl 11>2   Subjective: Patient  reports that he is ready for his procedure today.  Mentions that he is glad to have his heart under control so he can proceed with his ERCP today and EUS.  Mentions he has felt well, walked around and has sat in the chair for a length of time yesterday.  Denies chest pain, chest pressure, shortness of breath, increased work of breathing.  Objective: Vitals:   05/21/16 0538 05/21/16 1500 05/21/16 2023 05/22/16 0426  BP: (!) 153/73 (!) 154/63 (!) 147/58 100/64  Pulse: 73 67 67 71  Resp: 18 18 20 19   Temp: 97.3 F (36.3 C) 97.7 F (36.5 C) 98.2 F (36.8 C) 97.5 F (36.4 C)  TempSrc: Oral Oral Oral Oral  SpO2: 100% 100% 99% 100%  Weight:      Height:        Intake/Output Summary (Last 24 hours) at 05/22/16 0827 Last data filed at 05/22/16 0706  Gross per 24 hour  Intake              626 ml  Output              375 ml  Net              251 ml   Filed Weights   05/17/16 1743  Weight: 69.9 kg (154 lb 1.6 oz)    Examination:  General exam: Appears calm and comfortable  Respiratory system: Clear to auscultation. Respiratory effort normal. Cardiovascular system: S1 & S2 heard, RRR. No JVD, rubs, gallops or clicks. No pedal edema. Gastrointestinal system: Abdomen is nondistended, soft and nontender. No organomegaly or masses felt. Normal bowel sounds heard. Central nervous system: Alert and oriented. No focal neurological deficits. Extremities: Symmetric 5 x 5 power. Skin: No rashes, lesions or ulcers, jaundiced Psychiatry: Judgement and insight appear normal. Mood & affect appropriate.     Data Reviewed: I have personally reviewed following labs and imaging studies  CBC:  Recent Labs Lab 05/17/16 1227 05/18/16 0357 05/19/16 0401 05/20/16 0527 05/21/16 0340 05/22/16 0511  WBC 20.2 Repeated and verified X2.* 8.8 8.3 6.8 8.6 9.5  NEUTROABS 17.2* 7.0  --   --   --   --   HGB 9.4* 8.0* 8.2* 9.5* 9.1* 9.0*  HCT 28.0* 23.5* 23.7* 27.5* 26.3* 25.6*  MCV 92.6 91.8 90.5 90.8  90.7 89.8  PLT 348.0 272 327 389 342 [id number]   Basic Metabolic Panel:  Recent Labs Lab 05/17/16 1227 05/18/16 0357 05/19/16 0401 05/21/16 0340 05/22/16 0511  NA 134* 132* 133* 135 133*  K 6.4* 4.3 3.8 3.9 3.7  CL 100 102 103 101 99*  CO2 23 22 23 24 23   GLUCOSE 233* 103* 164* 202* 217*  BUN 25* 18 11 16 20   CREATININE 1.13 0.76 0.64 0.60* 0.72  CALCIUM 9.5 8.1* 8.0* 8.8* 8.5*  MG  --   --   --  1.4* 1.8   GFR: Estimated Creatinine Clearance: 64.3 mL/min (by C-G formula based on SCr of 0.72 mg/dL). Liver Function Tests:  Recent Labs Lab 05/17/16 1227 05/18/16 0357 05/21/16 0340  AST 179* 169* 88*  ALT 155* 148* 105*  ALKPHOS  673* 523* 454*  BILITOT 5.2* 4.5* 6.1*  PROT 7.0 6.0* 6.5  ALBUMIN 3.8 3.0* 3.1*   No results for input(s): LIPASE, AMYLASE in the last 168 hours. No results for input(s): AMMONIA in the last 168 hours. Coagulation Profile:  Recent Labs Lab 05/17/16 1227 05/18/16 0357  INR 1.2* 1.20   Cardiac Enzymes: No results for input(s): CKTOTAL, CKMB, CKMBINDEX, TROPONINI in the last 168 hours. BNP (last 3 results) No results for input(s): PROBNP in the last 8760 hours. HbA1C: No results for input(s): HGBA1C in the last 72 hours. CBG:  Recent Labs Lab 05/21/16 0809 05/21/16 1233 05/21/16 1724 05/21/16 2112 05/22/16 0815  GLUCAP 217* 382* 192* 218* 204*   Lipid Profile: No results for input(s): CHOL, HDL, LDLCALC, TRIG, CHOLHDL, LDLDIRECT in the last 72 hours. Thyroid Function Tests: No results for input(s): TSH, T4TOTAL, FREET4, T3FREE, THYROIDAB in the last 72 hours. Anemia Panel: No results for input(s): VITAMINB12, FOLATE, FERRITIN, TIBC, IRON, RETICCTPCT in the last 72 hours. Sepsis Labs:  Recent Labs Lab 05/17/16 1834 05/17/16 2151  05/19/16 0401 05/20/16 0527 05/21/16 0340 05/22/16 0511  PROCALCITON  --   --   < > 2.48 1.13 0.73 0.79  LATICACIDVEN 3.0* 1.7  --   --   --   --   --   < > = values in this interval not  displayed.  Recent Results (from the past 240 hour(s))  Culture, blood (routine x 2)     Status: None (Preliminary result)   Collection Time: 05/17/16  6:33 PM  Result Value Ref Range Status   Specimen Description BLOOD RIGHT ARM  Final   Special Requests BOTTLES DRAWN AEROBIC AND ANAEROBIC 5CC  Final   Culture   Final    NO GROWTH 4 DAYS Performed at [facility]    Report Status PENDING  Incomplete  Culture, blood (routine x 2)     Status: None (Preliminary result)   Collection Time: 05/17/16  6:33 PM  Result Value Ref Range Status   Specimen Description BLOOD LEFT ARM  Final   Special Requests IN PEDIATRIC BOTTLE 4CC  Final   Culture   Final    NO GROWTH 4 DAYS Performed at [facility]    Report Status PENDING  Incomplete  Urine culture     Status: Abnormal   Collection Time: 05/17/16 10:09 PM  Result Value Ref Range Status   Specimen Description URINE, CLEAN CATCH  Final   Special Requests NONE  Final   Culture MULTIPLE SPECIES PRESENT, SUGGEST RECOLLECTION (A)  Final   Report Status 05/19/2016 FINAL  Final         Radiology Studies: No results found.      Scheduled Meds: . ciprofloxacin  500 mg Oral BID  . diltiazem  60 mg Oral Q8H  . feeding supplement (ENSURE ENLIVE)  237 mL Oral BID BM  . finasteride  5 mg Oral Daily  . gabapentin  400 mg Oral QID  . insulin aspart  0-9 Units Subcutaneous TID WC  . lidocaine  1 patch Transdermal Q24H  . metroNIDAZOLE  500 mg Oral Q8H  . senna-docusate  1 tablet Oral BID  . sodium chloride flush  3 mL Intravenous Q12H   Continuous Infusions:    LOS: 5 days    Time spent: 30 minutes    Newman Pies, MD [facility] Pager [phone]  If 7PM-7AM, please contact night-coverage [email or web address] Password [email or web address] 05/22/2016, 8:27 AM

## 2016-05-22 NOTE — Anesthesia Postprocedure Evaluation (Signed)
Anesthesia Post Note  Patient: ODUS PICCOLI  Procedure(s) Performed: Procedure(s) (LRB): ENDOSCOPIC RETROGRADE CHOLANGIOPANCREATOGRAPHY (ERCP) (N/A)  Patient location during evaluation: PACU Anesthesia Type: General Level of consciousness: awake and alert and oriented Pain management: pain level controlled Vital Signs Assessment: post-procedure vital signs reviewed and stable Respiratory status: spontaneous breathing, nonlabored ventilation and respiratory function stable Cardiovascular status: blood pressure returned to baseline and stable Postop Assessment: no signs of nausea or vomiting Anesthetic complications: no    Last Vitals:  Vitals:   05/22/16 1450 05/22/16 1500  BP: (!) 154/59 (!) 149/58  Pulse: 62 60  Resp: 15 13  Temp:      Last Pain:  Vitals:   05/22/16 1434  TempSrc: Oral                 Nephi Savage A.

## 2016-05-22 NOTE — Anesthesia Preprocedure Evaluation (Addendum)
Anesthesia Evaluation  Patient identified by MRN, date of birth, ID band Patient awake    Reviewed: Allergy & Precautions, NPO status , Patient's Chart, lab work & pertinent test results  Airway Mallampati: I  TM Distance: >3 FB Neck ROM: Full    Dental no notable dental hx. (+) Caps, Teeth Intact   Pulmonary pneumonia, resolved,  Hx/o (+) PPD, treated   Pulmonary exam normal breath sounds clear to auscultation       Cardiovascular hypertension, Pt. on medications Normal cardiovascular exam+ dysrhythmias Atrial Fibrillation and Ventricular Tachycardia  Rhythm:Irregular Rate:Normal  Hx/o Tachy-Brady syndrome   Neuro/Psych Anxiety Hearing loss Hereditary peripheral neuropathy Hx/o cervical myelopathy  Neuromuscular disease    GI/Hepatic Elevated LFT's Painless jaundice Duodenal mass Bile duct obstruction   Endo/Other  diabetes, Well Controlled, Type 2, Oral Hypoglycemic Agents, Insulin DependentHyperlipidemia  Renal/GU Renal InsufficiencyRenal disease   ED    Musculoskeletal  (+) Arthritis , Osteoarthritis,  Hx/o Paget's Disease Chronic neck and back pain   Abdominal   Peds  Hematology  (+) anemia ,   Anesthesia Other Findings Jaundiced Icteric sclerae  Reproductive/Obstetrics                           Lab Results  Component Value Date   WBC 9.5 05/22/2016   HGB 9.0 (L) 05/22/2016   HCT 25.6 (L) 05/22/2016   MCV 89.8 05/22/2016   PLT 291 05/22/2016     Chemistry      Component Value Date/Time   NA 133 (L) 05/22/2016 0511   K 3.7 05/22/2016 0511   CL 99 (L) 05/22/2016 0511   CO2 23 05/22/2016 0511   BUN 20 05/22/2016 0511   CREATININE 0.72 05/22/2016 0511      Component Value Date/Time   CALCIUM 8.5 (L) 05/22/2016 0511   ALKPHOS 454 (H) 05/21/2016 0340   AST 88 (H) 05/21/2016 0340   ALT 105 (H) 05/21/2016 0340   BILITOT 6.1 (H) 05/21/2016 0340     EKG  05/22/2016:Undetermined rhythm Left axis deviation Septal infarct , age undetermined ST & T wave abnormality, consider lateral ischemia Echo 05/19/2016:Left ventricle: The cavity size was normal. Wall thickness was   increased in a pattern of mild LVH. Systolic function was normal.   The estimated ejection fraction was in the range of 55% to 60%.   Wall motion was normal; there were no regional wall motion   abnormalities. Doppler parameters are consistent with abnormal   left ventricular relaxation (grade 1 diastolic dysfunction). - Aortic valve: There was trivial regurgitation. - Mitral valve: Calcified annulus. - Pulmonary arteries: Systolic pressure was mildly increased. PA   peak pressure: 52 mm Hg (S). .  Anesthesia Physical Anesthesia Plan  ASA: IV  Anesthesia Plan: General   Post-op Pain Management:    Induction: Intravenous  Airway Management Planned: Oral ETT  Additional Equipment:   Intra-op Plan:   Post-operative Plan: Extubation in OR  Informed Consent: I have reviewed the patients History and Physical, chart, labs and discussed the procedure including the risks, benefits and alternatives for the proposed anesthesia with the patient or authorized representative who has indicated his/her understanding and acceptance.   Dental advisory given  Plan Discussed with: Anesthesiologist, CRNA and Surgeon  Anesthesia Plan Comments:        Anesthesia Quick Evaluation

## 2016-05-22 NOTE — Progress Notes (Signed)
Pt had 3.1 second pause at 0850. Pt was asymptomatic. Rounding MD and Cardiology PA made aware. No new orders.  Callie Fielding RN

## 2016-05-22 NOTE — Transfer of Care (Signed)
Immediate Anesthesia Transfer of Care Note  Patient: Andrew Norton  Procedure(s) Performed: Procedure(s): ENDOSCOPIC RETROGRADE CHOLANGIOPANCREATOGRAPHY (ERCP) (N/A)  Patient Location: PACU  Anesthesia Type:General  Level of Consciousness: awake, alert , oriented and patient cooperative  Airway & Oxygen Therapy: Patient Spontanous Breathing and Patient connected to face mask oxygen  Post-op Assessment: Report given to RN, Post -op Vital signs reviewed and stable and Patient moving all extremities X 4  Post vital signs: stable  Last Vitals:  Vitals:   05/22/16 1158 05/22/16 1434  BP: (!) 141/49 (!) 139/58  Pulse: 63 62  Resp: 11 10  Temp: 36.6 C     Last Pain:  Vitals:   05/22/16 1434  TempSrc: Oral         Complications: No apparent anesthesia complications

## 2016-05-22 NOTE — H&P (View-Only) (Signed)
[facility] Progress Note    Since last GI note: This is the first time I am meeting Mr. Cashatt. He is laying in bed, about to eat breakfast.  No nausea, vomiting, chest pains.  Objective: Vital signs in last 24 hours: Temp:  [97.3 F (36.3 C)-98.5 F (36.9 C)] 97.3 F (36.3 C) (11/06 0538) Pulse Rate:  [67-118] 73 (11/06 0538) Resp:  [18] 18 (11/06 0538) BP: (129-153)/(62-73) 153/73 (11/06 0538) SpO2:  [96 %-100 %] 100 % (11/06 0538) Last BM Date: 05/17/16 General: alert and oriented times 3, mild scleral icterus Heart: regular rate and rythm Abdomen: soft, non-tender, non-distended, normal bowel sounds   Lab Results:  Recent Labs  05/19/16 0401 05/20/16 0527 05/21/16 0340  WBC 8.3 6.8 8.6  HGB 8.2* 9.5* 9.1*  PLT 327 389 342  MCV 90.5 90.8 90.7    Recent Labs  05/19/16 0401 05/21/16 0340  NA 133* 135  K 3.8 3.9  CL 103 101  CO2 23 24  GLUCOSE 164* 202*  BUN 11 16  CREATININE 0.64 0.60*  CALCIUM 8.0* 8.8*    Recent Labs  05/21/16 0340  PROT 6.5  ALBUMIN 3.1*  AST 88*  ALT 105*  ALKPHOS 454*  BILITOT 6.1*    Medications: Scheduled Meds: . ciprofloxacin  500 mg Oral BID  . diltiazem  60 mg Oral Q8H  . feeding supplement (ENSURE ENLIVE)  237 mL Oral BID BM  . finasteride  5 mg Oral Daily  . gabapentin  400 mg Oral QID  . insulin aspart  0-9 Units Subcutaneous TID WC  . lidocaine  1 patch Transdermal Q24H  . metroNIDAZOLE  500 mg Oral Q8H  . senna-docusate  1 tablet Oral BID  . sodium chloride flush  3 mL Intravenous Q12H   Continuous Infusions: . heparin 1,400 Units/hr (05/20/16 1803)   PRN Meds:.cyclobenzaprine, hydrALAZINE, HYDROcodone-acetaminophen, metoprolol    Assessment/Plan: 82 y.o. male with painless jaundice, biliary obstruction, duodenal mass described on CT  CT scan images reviewed. It looks like he has a duodenal lesion that is obstructing his distal bile duct.  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  He needs biliary decompression and biopsy of the duodenal lesion.  ERCP alone may accomplish both those goals (placement of metal bile duct stent and mucosal biopsies).  Will be prepared for EUS if needed (i.e. if it doesn't look like mucosal biopsies will be adequate to make a clear diagnosis).  I explained the above to him and when his wife arrives I'm happy to talk with her as well.  Currently it looks like 12:30pm start time in [facility] endoscopy.  I've made him NPO after MN.    Primary service or cardiology to please hold heparin drip around 6:30 please.  Please alert me if cardiology does not feel it is safe to proceed tomorrow; his rate seems controlled well on the TID oral Ca Channel blocker and so we'll get ready for the procedures tomorrow.  He should ambulate in halls. Will probably need help with this but he needs to get up out of bed.    Milus Banister, MD  05/21/2016, 7:55 AM [facility] Pager [phone]

## 2016-05-22 NOTE — Anesthesia Procedure Notes (Addendum)
Procedure Name: Intubation Date/Time: 05/22/2016 12:52 PM Performed by: Lissa Morales Pre-anesthesia Checklist: Patient identified, Emergency Drugs available, Suction available and Patient being monitored Patient Re-evaluated:Patient Re-evaluated prior to inductionOxygen Delivery Method: Circle system utilized Preoxygenation: Pre-oxygenation with 100% oxygen Intubation Type: IV induction Ventilation: Mask ventilation without difficulty Grade View: Grade III Tube type: Oral Tube size: 8.0 mm Number of attempts: 1 Airway Equipment and Method: Stylet,  Oral airway and Video-laryngoscopy Placement Confirmation: ETT inserted through vocal cords under direct vision,  positive ETCO2 and breath sounds checked- equal and bilateral Secured at: 22 cm Tube secured with: Tape Dental Injury: Teeth and Oropharynx as per pre-operative assessment  Difficulty Due To: Difficulty was anticipated, Difficult Airway- due to reduced neck mobility and Difficult Airway- due to anterior larynx Comments: Cervical fusion with intermitant paresthesia  Of hands, Used glidescope to decrease mobility of neck

## 2016-05-22 NOTE — Progress Notes (Signed)
[facility] for Heparin Indication: new onset atrial fibrillation  Assessment: 37 YOM with new onset jaundice who was directly admitted on 11/2 from [place]. CT revealed intrahepatic biliary ductal dilatation. Planned ERCP 11/3, but developed new onset Afib and the procedure was cancelled and Heparin infusion started on 11/3.   Today, 05/22/2016   Heparin level this AM therapeutic at 0.5 on 1400 units/hr.  Infusion turned off at 06:30 this AM for ERCP today at 12:30PM  ERCP completed at 1440.  GI recommends holding heparin until 11/8 AM.  Goal of Therapy:  Heparin level 0.3-0.7 units/ml Monitor platelets by anticoagulation protocol: Yes   Plan:  Resume heparin IV infusion at 1400 units/hr on 11/8 at 0800 Heparin level 8 hours after restarting  Daily CBC and heparin level  Gretta Arab PharmD, BCPS Pager [phone] 05/22/2016 5:48 PM

## 2016-05-22 NOTE — Progress Notes (Signed)
[place] for Heparin Indication: new onset atrial fibrillation  Patient Measurements: Height: 5\' 10"  (177.8 cm) Weight: 154 lb 1.6 oz (69.9 kg) IBW/kg (Calculated) : 73 Heparin Dosing Weight: 70kg  Vital Signs: Temp: 97.5 F (36.4 C) (11/07 0426) Temp Source: Oral (11/07 0426) BP: 100/64 (11/07 0426) Pulse Rate: 71 (11/07 0426)  Labs:  Recent Labs  05/20/16 0527 05/21/16 0340 05/22/16 0511  HGB 9.5* 9.1* 9.0*  HCT 27.5* 26.3* 25.6*  PLT 389 342 291  HEPARINUNFRC 0.52 0.52 0.50  CREATININE  --  0.60* 0.72   Estimated Creatinine Clearance: 64.3 mL/min (by C-G formula based on SCr of 0.72 mg/dL).  Medical History: Past Medical History:  Diagnosis Date  . Anemia   . Arthritis    hips  . Atrial fibrillation ([place])   . Carpal tunnel syndrome    left leg sciatica  . Diabetes mellitus   . Hearing loss    left ear greater loss  . History of shingles    past hx. left hand  . Hyperlipidemia   . Hypertension    EKG, chest x ray 11/15/10 EPIC,  clearance Dr Sharlene Motts on chart/ pt states on meds to "prevent hypertension from diabetes"  . Paget's disease    scrotum  . Paroxysmal atrial fibrillation ([place]) 05/18/2016  . Positive PPD, treated    Medications:  Scheduled:  . ciprofloxacin  500 mg Oral BID  . feeding supplement (ENSURE ENLIVE)  237 mL Oral BID BM  . finasteride  5 mg Oral Daily  . gabapentin  400 mg Oral QID  . insulin aspart  0-9 Units Subcutaneous TID WC  . lidocaine  1 patch Transdermal Q24H  . magnesium oxide  400 mg Oral Daily  . metroNIDAZOLE  500 mg Oral Q8H  . senna-docusate  1 tablet Oral BID  . sodium chloride flush  3 mL Intravenous Q12H   Infusions:   Assessment: 69 YOM with new onset jaundice who was directly admitted on 11/2 from [place]. CT revealed intrahepatic biliary ductal dilatation. Planned ERCP 11/3, but developed new onset Afib and the procedure was cancelled and Heparin infusion started on  11/3. The patient is rate controlled on Cardizem.   Today, 05/22/2016   Heparin level this AM therapeutic at 0.5 on 1400 units/hr.  Infusion turned off at 06:30 this AM for ERCP today at 12:30PM  No bleeding noted today   CBC remains stable  Per Cardiology, maintain IV anticoagulation until all procedures are complete  Goal of Therapy:  Heparin level 0.3-0.7 units/ml Monitor platelets by anticoagulation protocol: Yes   Plan:   Follow up after procedure for plans to resume anticoagulation   Peggyann Juba, PharmD, BCPS Pager: [phone] 05/22/2016 11:19 AM

## 2016-05-22 NOTE — Progress Notes (Addendum)
Patient Name: Andrew Norton Date of Encounter: 05/22/2016  Primary Cardiologist: [facility] Problem List     Principal Problem:   Bile duct obstruction Active Problems:   HYPERKALEMIA   Hyperlipidemia   Constipation   Jaundice   Diabetes mellitus type 2, noninsulin dependent (HCC)   LFT elevation   Leukocytosis   Ampulla of Vater mass   Paroxysmal atrial fibrillation (HCC)   Protein-calorie malnutrition, severe   Abnormal CT of the abdomen   Tachy-brady syndrome (HCC)   Sinus pause   NSVT (nonsustained ventricular tachycardia) (HCC)    Subjective   No cardiac symptoms - no CP, SOB, dizziness, palpitations.  Inpatient Medications    . ciprofloxacin  500 mg Oral BID  . feeding supplement (ENSURE ENLIVE)  237 mL Oral BID BM  . finasteride  5 mg Oral Daily  . gabapentin  400 mg Oral QID  . insulin aspart  0-9 Units Subcutaneous TID WC  . lidocaine  1 patch Transdermal Q24H  . metroNIDAZOLE  500 mg Oral Q8H  . senna-docusate  1 tablet Oral BID  . sodium chloride flush  3 mL Intravenous Q12H    Vital Signs    Vitals:   05/21/16 0538 05/21/16 1500 05/21/16 2023 05/22/16 0426  BP: (!) 153/73 (!) 154/63 (!) 147/58 100/64  Pulse: 73 67 67 71  Resp: 18 18 20 19   Temp: 97.3 F (36.3 C) 97.7 F (36.5 C) 98.2 F (36.8 C) 97.5 F (36.4 C)  TempSrc: Oral Oral Oral Oral  SpO2: 100% 100% 99% 100%  Weight:      Height:        Intake/Output Summary (Last 24 hours) at 05/22/16 1025 Last data filed at 05/22/16 0706  Gross per 24 hour  Intake              626 ml  Output              375 ml  Net              251 ml   Filed Weights   05/17/16 1743  Weight: 154 lb 1.6 oz (69.9 kg)    Physical Exam    General: Thin elderly WM in no acute distress. HEENT: Normocephalic, atraumatic, sclera non-icteric, no xanthomas, nares are without discharge. Neck: JVP not elevated. Lungs: Clear bilaterally to auscultation without wheezes, rales, or rhonchi. Breathing is  unlabored. Cardiac: RRR S1 S2 without murmurs, rubs, or gallops.  Abdomen: Soft, non-tender, non-distended with normoactive bowel sounds. No rebound/guarding. Extremities: No clubbing or cyanosis. No edema. Distal pedal pulses are 2+ and equal bilaterally. Skin: Warm and dry, no significant rash. Neuro: Alert and oriented X 3. Sensation in tact. Follows commands. Psych:  Responds to questions appropriately with a normal affect.  Labs    CBC  Recent Labs  05/21/16 0340 05/22/16 0511  WBC 8.6 9.5  HGB 9.1* 9.0*  HCT 26.3* 25.6*  MCV 90.7 89.8  PLT 342 [id number]   Basic Metabolic Panel  Recent Labs  05/21/16 0340 05/22/16 0511  NA 135 133*  K 3.9 3.7  CL 101 99*  CO2 24 23  GLUCOSE 202* 217*  BUN 16 20  CREATININE 0.60* 0.72  CALCIUM 8.8* 8.5*  MG 1.4* 1.8   Liver Function Tests  Recent Labs  05/21/16 0340  AST 88*  ALT 105*  ALKPHOS 454*  BILITOT 6.1*  PROT 6.5  ALBUMIN 3.1*    Telemetry    Continued PAF with  HR 140s and intermittent post-conversion pauses (generally 2-3 seconds, up to 4.8 seconds around 2am), brief junctional bradycardia this AM with HR in the 30s  Radiology    Ct Abdomen Pelvis W Contrast  Result Date: 05/11/2016 CLINICAL DATA:  Jaundice for about 2 weeks, abdominal swelling EXAM: CT ABDOMEN AND PELVIS WITH CONTRAST TECHNIQUE: Multidetector CT imaging of the abdomen and pelvis was performed using the standard protocol following bolus administration of intravenous contrast. CONTRAST:  141mL ISOVUE-300 IOPAMIDOL (ISOVUE-300) INJECTION 61% COMPARISON:  None. FINDINGS: Lower chest: Lung bases shows mild peripheral interstitial prominence probable probable chronic in nature. No segmental infiltrate. Hepatobiliary: There is intrahepatic biliary ductal dilatation. No focal hepatic mass. No calcified gallstones are noted within gallbladder. There is significant CBD dilatation up to 1.8 cm. There is abrupt narrowing of CBD at the level of ampulla There is  a polypoid lesion at the level of ampulla within duodenum measures about 1.5 cm axial image 47. Ampullary mass cannot be excluded. Further correlation with ERCP is recommended. Pancreas: There is fatty replaced pancreas. No focal pancreatic mass. No dilatation of pancreatic duct. Spleen: Enhanced spleen is unremarkable. Adrenals/Urinary Tract: No adrenal gland mass. Mild for renal cortical thinning and lobulated contour probable due to atrophy. Bilateral renal symmetrical enhancement. Delayed renal images shows bilateral renal symmetrical excretion. Bilateral visualized proximal ureter is unremarkable. Stomach/Bowel: There is no small bowel obstruction. Oral contrast material was given to the patient. Some liquid stool noted in right colon. No pericecal inflammation. The terminal ileum is unremarkable. Liquid stool noted in transverse colon and descending colon. No evidence of obstructing mass. Moderate stool noted within redundant sigmoid colon. Moderate stool noted within rectum. No definite colonic obstruction. No colitis or acute diverticulitis. Vascular/Lymphatic: Mild atherosclerotic calcifications of distal abdominal aorta. No aortic aneurysm. No retroperitoneal or mesenteric adenopathy. Reproductive: Prostate gland is unremarkable. No bladder filling defects are noted. There are metallic artifacts from left hip prosthesis. Other: No ascites or free abdominal air.  No inguinal adenopathy. Musculoskeletal: No destructive bony lesions are noted. Sagittal images of the spine shows diffuse osteopenia. Degenerative changes lumbar spine. There is mild to moderate compression deformity L1 vertebral body of indeterminate age. Clinical correlation is necessary. IMPRESSION: 1. There is intrahepatic biliary ductal dilatation. No focal hepatic mass. No calcified gallstones are noted within gallbladder. There is significant CBD dilatation up to 1.8 cm. There is abrupt narrowing of CBD at the level of ampulla There is a  polypoid lesion at the level of ampulla within duodenum measures about 1.5 cm axial image 47. Ampullary mass cannot be excluded. Further correlation with ERCP is recommended. 2. Moderate colonic stool in distal colon. No definite evidence of acute colitis or diverticulitis. 3. No small bowel obstruction. 4. Metallic artifacts from left hip prosthesis. Compression deformity L1 vertebral body of indeterminate age. Clinical correlation is necessary. Electronically Signed   By: Lahoma Crocker M.D.   On: 05/11/2016 16:58     Patient Profile     82 year old male with DM, HTN, cervical myelopathy with gait instability admitted with new onset jaundice noted to develop atrial fibrillation with rapid ventricular response which was asymptomatic (paroxysms of AF/NSR on tele this adm). 2D Echo 05/19/16: mild LVH, EF 55-60%, no RWMa, grade 1 DD, mildly ?PASP 52. 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 CHADSVASC 4. Tele remarkable for occasional nonsustained bradycardia with in sinus, intermittent post-conversion pauses (up to 5 sec on 05/20/16), AF RVR, brief NSVT.  Assessment &  Plan    1. 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 Reviewed with Dr. Tamala Julian - the patient will need to be on telemetry monitoring while procedure is being done and he will need external pads in place as a precaution in case he develops hemodynamically significant bradycardia during the procedure.  2. Paroxysmal atrial fib with RVR with probable tachy-brady syndrome - telemetry notable for post-conversion pauses (up to 5 sec yesterday, 4.8 sec overnight) with junctional bradycardia interspersed with AF RVR 140s. He is asymptomatic throughout. Reviewed with cardiology MD. As he has remained asymptomatic and hemodynamically stable during these arrhythmias, Dr. Tamala Julian feels it is OK to proceed with EUS with above precautions. Will hold further  diltiazem and ask EP to evaluate to determine options for rhythm maintenance versus pacemaker placement. Amiodarone is being avoided given his abnormal LFTs and jaundice. He is being maintained on parenteral anticoagulation until all procedures are complete.  3. NSVT - quiescent with repletion of magnesium. Will add MagOx 400mg  daily.  4. Chronic appearing anemia - will need to be followed closely while on anticoagulation. Denies any bleeding.  5. HTN - labile, follow for now given intermittent bradycardia.  Signed, Charlie Pitter, PA-C  05/22/2016, 10:25 AM   The patient has been seen in conjunction with Melina Copa, PA-C. All aspects of care have been considered and discussed. The patient has been personally interviewed, examined, and all clinical data has been reviewed.   Findings at ERCP were reviewed.  The patient clearly has Tachycardia-Bradycardia syndrome and may need permanent pacemaker therapy in addition to medications to control his rhythm disturbance. Pacemaker therapy would only be appropriate if his GI/overall prognosis is favorable.   He is not a good long-term anticoagulation candidate because of frailty and at least 2 falls with injury within the past 6 months. The most recent one occurred one month ago when he "lost his balance" in the bathroom at the drugstore. He does not believe that these were episodes of syncope. There is no way to be certain about that given the pauses that we have identified while hospitalized.  Until we obtain further information about the overall GI problem and prognosis, symptomatic management as we have been doing is most reasonable approach.  We will discontinue beta blocker and diltiazem therapy. He will need at least a 30 day monitor as an outpatient. We'll hold off on involving electrophysiology into we know more. He and the family are reluctant to consider pacemaker therapy without further evaluation given his asymptomatic state.

## 2016-05-22 NOTE — Progress Notes (Signed)
Order to send patient to endoscopy with pacer pads on and Zoll. Endo requested that pacer pads not be placed on patient but sent with him. Endo has Zoll in department. Cardiology PA and GI MD made aware of plan.   Callie Fielding RN

## 2016-05-22 NOTE — Interval H&P Note (Signed)
History and Physical Interval Note:  05/22/2016 12:08 PM  Andrew Norton  has presented today for surgery, with the diagnosis of painless jaundice, duodenal mass, biliary obstruction  The various methods of treatment have been discussed with the patient and family. After consideration of risks, benefits and other options for treatment, the patient has consented to  Procedure(s): ENDOSCOPIC RETROGRADE CHOLANGIOPANCREATOGRAPHY (ERCP) (N/A) as a surgical intervention .  The patient's history has been reviewed, patient examined, no change in status, stable for surgery.  I have reviewed the patient's chart and labs.  Questions were answered to the patient's satisfaction.     Milus Banister

## 2016-05-23 ENCOUNTER — Encounter (HOSPITAL_COMMUNITY)
Admission: AD | Disposition: A | Discharge status: Rehabilitation Facility | Payer: Self-pay | Source: Ambulatory Visit | Attending: Internal Medicine

## 2016-05-23 ENCOUNTER — Encounter (HOSPITAL_COMMUNITY): Payer: Self-pay | Admitting: Gastroenterology

## 2016-05-23 ENCOUNTER — Inpatient Hospital Stay (HOSPITAL_COMMUNITY): Payer: Medicare Other

## 2016-05-23 LAB — CBC
HEMATOCRIT: 23.1 % — AB (ref 39.0–52.0)
HEMOGLOBIN: 8.2 g/dL — AB (ref 13.0–17.0)
MCH: 31.5 pg (ref 26.0–34.0)
MCHC: 35.5 g/dL (ref 30.0–36.0)
MCV: 88.8 fL (ref 78.0–100.0)
Platelets: 255 10*3/uL (ref 150–400)
RBC: 2.6 MIL/uL — ABNORMAL LOW (ref 4.22–5.81)
RDW: 14.6 % (ref 11.5–15.5)
WBC: 8.4 10*3/uL (ref 4.0–10.5)

## 2016-05-23 LAB — HEPATIC FUNCTION PANEL
ALBUMIN: 2.7 g/dL — AB (ref 3.5–5.0)
ALK PHOS: 509 U/L — AB (ref 38–126)
ALT: 132 U/L — AB (ref 17–63)
AST: 209 U/L — AB (ref 15–41)
BILIRUBIN DIRECT: 5.8 mg/dL — AB (ref 0.1–0.5)
BILIRUBIN TOTAL: 8.7 mg/dL — AB (ref 0.3–1.2)
Indirect Bilirubin: 2.9 mg/dL — ABNORMAL HIGH (ref 0.3–0.9)
Total Protein: 5.7 g/dL — ABNORMAL LOW (ref 6.5–8.1)

## 2016-05-23 LAB — GLUCOSE, CAPILLARY
GLUCOSE-CAPILLARY: 186 mg/dL — AB (ref 65–99)
Glucose-Capillary: 229 mg/dL — ABNORMAL HIGH (ref 65–99)
Glucose-Capillary: 196 mg/dL — ABNORMAL HIGH (ref 65–99)
Glucose-Capillary: 209 mg/dL — ABNORMAL HIGH (ref 65–99)

## 2016-05-23 LAB — HEPARIN LEVEL (UNFRACTIONATED): Heparin Unfractionated: 0.33 IU/mL (ref 0.30–0.70)

## 2016-05-23 SURGERY — ERCP, WITH INTERVENTION IF INDICATED
Anesthesia: General

## 2016-05-23 MED ORDER — DILTIAZEM LOAD VIA INFUSION
10.0000 mg | Freq: Once | INTRAVENOUS | Status: AC
  Administered 2016-05-23: 10 mg via INTRAVENOUS
  Filled 2016-05-23: qty 10

## 2016-05-23 MED ORDER — DILTIAZEM HCL-DEXTROSE 100-5 MG/100ML-% IV SOLN (PREMIX)
5.0000 mg/h | INTRAVENOUS | Status: DC
  Administered 2016-05-23 – 2016-05-24 (×2): 5 mg/h via INTRAVENOUS
  Filled 2016-05-23 (×2): qty 100

## 2016-05-23 NOTE — Evaluation (Signed)
Physical Therapy Evaluation Patient Details Name: Andrew Norton MRN: [id number] DOB: [birth year] Today's Date: 05/23/2016   History of Present Illness  82 y.o. male past medical history of essential hypertension, non-insulin-dependent diabetes mellitus, neuropathy with gait instability and presented to hospital with new onset jaundice with an abdominal CT scan showed intrahepatic and biliary ductal dilation, s/p ERCP 05/22/16  Clinical Impression  Pt admitted with above diagnosis. Pt currently with functional limitations due to the deficits listed below (see PT Problem List).  Pt will benefit from skilled PT to increase their independence and safety with mobility to allow discharge to the venue listed below.  Pt assisted with ambulating in hallway and mobilizing well,  Pt encouraged to ambulate with staff during acute stay.     Follow Up Recommendations Home health PT    Equipment Recommendations  None recommended by PT    Recommendations for Other Services       Precautions / Restrictions Precautions Precautions: Fall      Mobility  Bed Mobility Overal bed mobility: Needs Assistance Bed Mobility: Supine to Sit     Supine to sit: Min assist     General bed mobility comments: assist for trunk  Transfers Overall transfer level: Needs assistance Equipment used: Rolling walker (2 wheeled) Transfers: Sit to/from Stand Sit to Stand: Min assist         General transfer comment: assist to rise and steady  Ambulation/Gait Ambulation/Gait assistance: Min guard Ambulation Distance (Feet): 200 Feet Assistive device: Rolling walker (2 wheeled) Gait Pattern/deviations: Step-through pattern;Trunk flexed;Decreased stride length     General Gait Details: verbal cues for safe use of RW, posture  Stairs            Wheelchair Mobility    Modified Rankin (Stroke Patients Only)       Balance                                             Pertinent  Vitals/Pain Pain Assessment: 0-10 Pain Score: 3  Pain Location: abdomen Pain Descriptors / Indicators: Aching Pain Intervention(s): Limited activity within patient's tolerance;Monitored during session;Repositioned    Home Living Family/patient expects to be discharged to:: Private residence Living Arrangements: Spouse/significant other Available Help at Discharge: Family;Available PRN/intermittently Type of Home: House Home Access: Stairs to enter     Home Layout: Bed/bath upstairs;Two level Home Equipment: Walker - 2 wheels;Cane - single point;Shower seat      Prior Function Level of Independence: Independent with assistive device(s)         Comments: uses SPC in community     Hand Dominance        Extremity/Trunk Assessment               Lower Extremity Assessment: Generalized weakness         Communication   Communication: No difficulties  Cognition Arousal/Alertness: Awake/alert Behavior During Therapy: WFL for tasks assessed/performed Overall Cognitive Status: Within Functional Limits for tasks assessed                      General Comments      Exercises     Assessment/Plan    PT Assessment Patient needs continued PT services  PT Problem List Decreased strength;Decreased range of motion;Decreased mobility;Decreased knowledge of use of DME;Pain          PT Treatment  Interventions DME instruction;Gait training;Functional mobility training;Therapeutic activities;Therapeutic exercise;Patient/family education;Stair training    PT Goals (Current goals can be found in the Care Plan section)  Acute Rehab PT Goals PT Goal Formulation: With patient Time For Goal Achievement: 05/30/16 Potential to Achieve Goals: Good    Frequency Min 3X/week   Barriers to discharge        Co-evaluation               End of Session Equipment Utilized During Treatment: Gait belt Activity Tolerance: Patient tolerated treatment well Patient  left: in chair;with call bell/phone within reach;with chair alarm set           Time: [id number] PT Time Calculation (min) (ACUTE ONLY): 13 min   Charges:   PT Evaluation $PT Eval Low Complexity: 1 Procedure     PT G Codes:        Eleuterio Dollar,KATHrine E 05/23/2016, 12:33 PM Carmelia Bake, PT, DPT 05/23/2016 Pager: [phone]

## 2016-05-23 NOTE — Progress Notes (Signed)
[facility] for Heparin Indication: new onset atrial fibrillation . heparin 1,400 Units/hr (05/23/16 0749)   Assessment: 31 YOM with new onset jaundice who was directly admitted on 11/2 from [place]. CT revealed intrahepatic biliary ductal dilatation. Planned ERCP 11/3, but developed new onset Afib and the procedure was cancelled and Heparin infusion started on 11/3.   Today, 05/23/2016   Heparin restarted without bolus this AM at 0800  following ERCP yesterday  8 hr heparin level = 0.33 on 1400 (previously therapeutic on this rate  CBC: H/H lower but no bleeding noted; Plt wnl  No bleeding or infusion issues per RN  Goal of Therapy:  Heparin level 0.3-0.7 units/ml Monitor platelets by anticoagulation protocol: Yes   Plan:  Continue heparin IV infusion at 1400 units/hr  Will defer confirmatory level to daily level as patient previously stable on this rate Daily CBC and heparin level  Reuel Boom, PharmD, BCPS Pager: [phone] 05/23/2016, 3:42 PM

## 2016-05-23 NOTE — Progress Notes (Signed)
Inpatient Diabetes Program Recommendations  AACE/ADA: New Consensus Statement on Inpatient Glycemic Control (2015)  Target Ranges:  Prepandial:   less than 140 mg/dL      Peak postprandial:   less than 180 mg/dL (1-2 hours)      Critically ill patients:  140 - 180 mg/dL   Results for KEIONTE, PFLANZ (MRN [id number]) as of 05/23/2016 13:50  Ref. Range 05/22/2016 08:15 05/22/2016 16:52 05/22/2016 21:35  Glucose-Capillary Latest Ref Range: 65 - 99 mg/dL 204 (H) 247 (H) 297 (H)   Results for STILLMAN, CAHOON (MRN [id number]) as of 05/23/2016 13:50  Ref. Range 05/23/2016 07:42 05/23/2016 13:13  Glucose-Capillary Latest Ref Range: 65 - 99 mg/dL 186 (H) 229 (H)    Home DM Meds: Metformin 1000 mg BID       Glipizide 10 mg BID       Januvia 100 mg daily  Current Insulin Orders: Novolog Moderate Correction Scale/ SSI (0-15 units) TID AC     MD- Please consider the following in-hospital insulin adjustments while patient's home oral DM meds are on hold:  1. Start low dose basal insulin: Lantus 10 units daily (0.15 units/kg dosing based on weight of 70kg)  2. Start Novolog Meal Coverage: Novolog 3 units TIDWC (hold if pt eats <50% of meal)       --Will follow patient during hospitalization--  Wyn Quaker RN, MSN, CDE Diabetes Coordinator Inpatient Glycemic Control Team Team Pager: [phone] (8a-5p)

## 2016-05-23 NOTE — Progress Notes (Signed)
Progress Note   Subjective  Chief Complaint: Jaundice, Biliary Obstruction, Duodenal mass?  This morning Andrew Norton is found sitting up in the chair by his bedside, he tells me that he has been constipated for 3 days, but took more Miralax this am and feels as though he needs to have a BM. He did complain of some chest pain this morning, but this did not worsen with his ambulation around the halls. Denies any questions or concerns. Aware that we are waiting for biopsies.   Objective   Vital signs in last 24 hours: Temp:  [97.6 F (36.4 C)-97.9 F (36.6 C)] 97.8 F (36.6 C) (11/08 0654) Pulse Rate:  [55-73] 73 (11/08 0654) Resp:  [10-18] 15 (11/08 0654) BP: (129-159)/(49-84) 129/84 (11/08 0654) SpO2:  [98 %-100 %] 98 % (11/08 0654) Weight:  [154 lb (69.9 kg)] 154 lb (69.9 kg) (11/07 1158) Last BM Date: 05/22/16 General: Jaundiced Elderly Caucasian male in NAD Heart:  Regular rate and rhythm; no murmurs Lungs: Respirations even and unlabored, lungs CTA bilaterally Abdomen:  Soft, nontender and nondistended. Normal bowel sounds. Extremities:  Without edema. Neurologic:  Alert and oriented,  grossly normal neurologically. Psych:  Cooperative. Normal mood and affect.  Intake/Output from previous day: 11/07 0701 - 11/08 0700 In: 1490 [P.O.:240; I.V.:1250] Out: 175 [Urine:175]  Lab Results:  Recent Labs  05/21/16 0340 05/22/16 0511 05/23/16 0401  WBC 8.6 9.5 8.4  HGB 9.1* 9.0* 8.2*  HCT 26.3* 25.6* 23.1*  PLT 342 291 255   BMET  Recent Labs  05/21/16 0340 05/22/16 0511  NA 135 133*  K 3.9 3.7  CL 101 99*  CO2 24 23  GLUCOSE 202* 217*  BUN 16 20  CREATININE 0.60* 0.72  CALCIUM 8.8* 8.5*   LFT  Recent Labs  05/23/16 0401  PROT 5.7*  ALBUMIN 2.7*  AST 209*  ALT 132*  ALKPHOS 509*  BILITOT 8.7*  BILIDIR 5.8*  IBILI 2.9*   Andrew Norton/INR No results for input(s): LABPROT, INR in the last 72 hours.  Studies/Results: Dg Ercp Biliary & Pancreatic Ducts  Result  Date: 05/22/2016 CLINICAL DATA:  [place] EXAM: ERCP TECHNIQUE: Multiple spot images obtained with the fluoroscopic device and submitted for interpretation post-procedure. FLUOROSCOPY TIME:  Fluoroscopy Time:  5 minutes and 12 seconds Radiation Exposure Index (if provided by the fluoroscopic device): 87 mGy Number of Acquired Spot Images: 6 COMPARISON:  None. FINDINGS: The common bile duct is markedly dilated. There is a filling defect in the distal common bile duct. Contrast also fills the pancreatic duct. IMPRESSION: See above. These images were submitted for radiologic interpretation only. Please see the procedural report for the amount of contrast and the fluoroscopy time utilized. Electronically Signed   By: Marybelle Killings M.D.   On: 05/22/2016 16:03   ERCP 05/22/16-Dr. Ardis Hughs Findings:      The scout film was normal. There was very tortuous upper esophageal       anatomy, ? Zenkers diverticulum. I could not advanced the duodenoscope       into the esophagus initially. I used a standard adult gastroscope to       place a Savory wire deeply into his stomach, confirmed tortuous upper       esophageal anatomy (still ? for Zenkers but this was not obvious). Using       fluoro views I passed the duodenoscope over the wire into his stomach       without further difficulty. The scope was advanced to  the major papilla.       The ampulla was very bulging and there was sludge oozing from the       orifice. A .035 hydrawire was used to cannulate the bile duct. An       identical wire was temporarily placed into the main pancreatic duct to       facilitate biliary cannulation. Cholangiogram showed a round distal CBD       defect, this did not appear mobile and I though it was possibly a       neoplasm (pedunculated). The extrahepatic bile duct was diffusely       dilated (CBD 12-35mm). I performed biliary sphincterotomy and then       balloon sweeping with 12-66mm biliary balloon. A large (1.2cm), round,        dark bile duct stone was delivered into the duodenum. There were no       further obvious stones in the biliary tree on repeat cholangiogram. I       was concerned about the mucosa in the exposed distal CBD, it was       somewhat noeplastic appearing circumferentially and so I used standard       forceps to biopsy this ( +/- firm mucosa on biopsy). Given his overall       frailty, ?underlying neoplastic mucosa in the distal CBD wall I placed a       4cm long, covered SEMS across the ampulla. There was no waisting and on       probing with the scope tip and forceps it was clear that the stent was       too mobile, likely to migrate and so I removed it. Impression:               - Very tortuous proximal esophagus, ?Zenkers. See                            above.                           - Dilated extrahepatic bile duct containing a                            single spherical 1.2cm stone, removed after biliary                            sphincterotomy and balloon extraction                           - Circumferentially abnormal distal bile duct                            mucosa (exposed broadly after sphincterotomy), ?                            neoplam. This was biopsied with forceps.                           - Temporary insertion of 4cm covered SEMS, it was  clear that there was no 'waist' and that the stent                            was very mobile after placement; highly likely to                            migrate so I removed it. Recommendation:           - Return patient to hospital ward for ongoing care.                           - Follow LFTS, await path results.                           - Cardiac care.                           - Given the long sphincerotomy and biopsies, I                            prefer that heparin be held until tomorrow AM.    Assessment / Plan:   Assessment: 1. Painless Jaundice: biliary obstruction-Andrew Norton s/p ERCP  05/22/16-stone removed,stent unable to be placed, LFT's increased today, duodenal bx pending 2. Duodenal Mass: See above 3. Elevated transaminases  Plan: 1. Continue to observe and monitor LFT's 2. Continue supportive measures 3. Awaiting bx results 4. Will discuss above with Dr. Ardis Hughs, please await any further recs  Thank you for your kind consultation, we will continue to follow.    LOS: 6 days   Levin Erp  05/23/2016, 10:39 AM  Pager # [phone]   ________________________________________________________________________  Velora Heckler GI MD note:  I reviewed the data and agree with the assessment and plan described above.  Awaiting biopsies of distal bile duct mucosa, overall I'm less concerned about underlying malignancy than when he presented, more hopeful that this is benign disease, stone related.  Should repeat LFTs tomorrow, there was very good flow of bile, contrast into his duodenum following the ERCP and so I suspect his LFTs will begin to improve.   Owens Loffler, MD [facility] Pager [phone]

## 2016-05-23 NOTE — Progress Notes (Signed)
[facility] PROGRESS NOTE    Progress Note  Andrew Norton  [id number] DOB: Jan 13, 1927 DOA: 05/17/2016 PCP: Laurey Morale, MD     Brief Narrative:   ASAEL FUTRELL is an 82 y.o. male past medical history of essential hypertension, non-insulin-dependent diabetes mellitus, neuropathy with gait instability they came into the hospital with new onset jaundice with an abdominal CT scan showed intrahepatic and biliary ductal dilation.  Assessment/Plan:   Ampullary lesion due to 1.2 cm mm Bile duct obstruction ERCP was done that showed biliary stone status post sphincterotomy and balloon distraction. Distal bile duct dilatation biopsies were done. Stent is not placed.  New-onset atrial fibrillation/Tachy-brady syndrome [facility]): Cardiology was consulted he was started on Cardizem by mouth, but he became significantly bradycardic, so diltiazem was DC'd he's had episodes of heart rate in the 130s and pauses with bradycardia. Cardiology to discuss pacemaker.  Leukocytosis: Improve with Cipro and Flagyl will continue Cipro Flagyl as an outpatient.   HYPERKALEMIA Resolved, , magnesium 1.8.  Diabetes mellitus type 2, noninsulin dependent (HCC) A1c of 7.6 continue hold oral hypoglycemic agents, continue sliding scale insulin.  Protein-calorie malnutrition, severe  NSVT (nonsustained ventricular tachycardia) (HCC)  Essential hypertension: Continue hold Cozaar and diltiazem, blood pressure seems to be stable  Chronic constipation: Continue stool softener.  DVT prophylaxis: heparin Family Communication: Wife Disposition Plan/Barrier to D/C: unable to determine Code Status:     Code Status Orders        Start     Ordered   05/17/16 1829  Full code  Continuous     05/17/16 1828    Code Status History    Date Active Date Inactive Code Status Order ID Comments User Context   04/13/2015 12:44 AM 04/16/2015  3:42 PM Full Code [id number]  Allyne Gee, MD Inpatient   11/05/2012  7:13  PM 11/08/2012  4:53 PM Full Code [id number]  [name], MD Inpatient    Advance Directive Documentation   Flowsheet Row Most Recent Value  Type of Advance Directive  Healthcare Power of Attorney, Living will  Pre-existing out of facility DNR order (yellow form or pink MOST form)  No data  "MOST" Form in Place?  No data        IV Access:    Peripheral IV   Procedures and diagnostic studies:   Dg Ercp Biliary & Pancreatic Ducts  Result Date: 05/22/2016 CLINICAL DATA:  Gallstone EXAM: ERCP TECHNIQUE: Multiple spot images obtained with the fluoroscopic device and submitted for interpretation post-procedure. FLUOROSCOPY TIME:  Fluoroscopy Time:  5 minutes and 12 seconds Radiation Exposure Index (if provided by the fluoroscopic device): 87 mGy Number of Acquired Spot Images: 6 COMPARISON:  None. FINDINGS: The common bile duct is markedly dilated. There is a filling defect in the distal common bile duct. Contrast also fills the pancreatic duct. IMPRESSION: See above. These images were submitted for radiologic interpretation only. Please see the procedural report for the amount of contrast and the fluoroscopy time utilized. Electronically Signed   By: Marybelle Killings M.D.   On: 05/22/2016 16:03     Medical Consultants:    None.  Anti-Infectives:   None  Subjective:    [name] he relates chest pain this morning.   Objective:    Vitals:   05/22/16 1500 05/22/16 1510 05/22/16 2140 05/23/16 0654  BP: (!) 149/58 (!) 159/60 140/73 129/84  Pulse: 60 (!) 57 66 73  Resp: 13 10 12 15   Temp:  97.9 F (36.6 C) 97.8 F (36.6 C)  TempSrc:   Oral Oral  SpO2: 100% 100% 100% 98%  Weight:      Height:        Intake/Output Summary (Last 24 hours) at 05/23/16 1149 Last data filed at 05/22/16 2155  Gross per 24 hour  Intake             1490 ml  Output                0 ml  Net             1490 ml   Filed Weights   05/17/16 1743 05/22/16 1158  Weight: 69.9 kg (154 lb 1.6 oz)  69.9 kg (154 lb)    Exam: General exam: In no acute distress. Respiratory system: Good air movement and clear to auscultation. Cardiovascular system: S1 & S2 heard, RRR. Gastrointestinal system: Abdomen is nondistended, soft and nontender.  Central nervous system: Alert and oriented. No focal neurological deficits. Extremities: No pedal edema. Skin: No rashes, lesions or ulcers Psychiatry: Judgement and insight appear normal. Mood & affect appropriate.    Data Reviewed:    Labs: Basic Metabolic Panel:  Recent Labs Lab 05/17/16 1227 05/18/16 0357 05/19/16 0401 05/21/16 0340 05/22/16 0511  NA 134* 132* 133* 135 133*  K 6.4* 4.3 3.8 3.9 3.7  CL 100 102 103 101 99*  CO2 23 22 23 24 23   GLUCOSE 233* 103* 164* 202* 217*  BUN 25* 18 11 16 20   CREATININE 1.13 0.76 0.64 0.60* 0.72  CALCIUM 9.5 8.1* 8.0* 8.8* 8.5*  MG  --   --   --  1.4* 1.8   GFR Estimated Creatinine Clearance: 64.3 mL/min (by C-G formula based on SCr of 0.72 mg/dL). Liver Function Tests:  Recent Labs Lab 05/17/16 1227 05/18/16 0357 05/21/16 0340 05/23/16 0401  AST 179* 169* 88* 209*  ALT 155* 148* 105* 132*  ALKPHOS 673* 523* 454* 509*  BILITOT 5.2* 4.5* 6.1* 8.7*  PROT 7.0 6.0* 6.5 5.7*  ALBUMIN 3.8 3.0* 3.1* 2.7*   No results for input(s): LIPASE, AMYLASE in the last 168 hours. No results for input(s): AMMONIA in the last 168 hours. Coagulation profile  Recent Labs Lab 05/17/16 1227 05/18/16 0357  INR 1.2* 1.20    CBC:  Recent Labs Lab 05/17/16 1227 05/18/16 0357 05/19/16 0401 05/20/16 0527 05/21/16 0340 05/22/16 0511 05/23/16 0401  WBC 20.2 Repeated and verified X2.* 8.8 8.3 6.8 8.6 9.5 8.4  NEUTROABS 17.2* 7.0  --   --   --   --   --   HGB 9.4* 8.0* 8.2* 9.5* 9.1* 9.0* 8.2*  HCT 28.0* 23.5* 23.7* 27.5* 26.3* 25.6* 23.1*  MCV 92.6 91.8 90.5 90.8 90.7 89.8 88.8  PLT 348.0 272 327 389 342 291 255   Cardiac Enzymes: No results for input(s): CKTOTAL, CKMB, CKMBINDEX,  TROPONINI in the last 168 hours. BNP (last 3 results) No results for input(s): PROBNP in the last 8760 hours. CBG:  Recent Labs Lab 05/21/16 2112 05/22/16 0815 05/22/16 1652 05/22/16 2135 05/23/16 0742  GLUCAP 218* 204* 247* 297* 186*   D-Dimer: No results for input(s): DDIMER in the last 72 hours. Hgb A1c: No results for input(s): HGBA1C in the last 72 hours. Lipid Profile: No results for input(s): CHOL, HDL, LDLCALC, TRIG, CHOLHDL, LDLDIRECT in the last 72 hours. Thyroid function studies: No results for input(s): TSH, T4TOTAL, T3FREE, THYROIDAB in the last 72 hours.  Invalid input(s): FREET3 Anemia  work up: No results for input(s): VITAMINB12, FOLATE, FERRITIN, TIBC, IRON, RETICCTPCT in the last 72 hours. Sepsis Labs:  Recent Labs Lab 05/17/16 1834 05/17/16 2151  05/19/16 0401 05/20/16 0527 05/21/16 0340 05/22/16 0511 05/23/16 0401  PROCALCITON  --   --   < > 2.48 1.13 0.73 0.79  --   WBC  --   --   < > 8.3 6.8 8.6 9.5 8.4  LATICACIDVEN 3.0* 1.7  --   --   --   --   --   --   < > = values in this interval not displayed. Microbiology Recent Results (from the past 240 hour(s))  Culture, blood (routine x 2)     Status: None   Collection Time: 05/17/16  6:33 PM  Result Value Ref Range Status   Specimen Description BLOOD RIGHT ARM  Final   Special Requests BOTTLES DRAWN AEROBIC AND ANAEROBIC 5CC  Final   Culture   Final    NO GROWTH 5 DAYS Performed at [facility]    Report Status 05/22/2016 FINAL  Final  Culture, blood (routine x 2)     Status: None   Collection Time: 05/17/16  6:33 PM  Result Value Ref Range Status   Specimen Description BLOOD LEFT ARM  Final   Special Requests IN PEDIATRIC BOTTLE 4CC  Final   Culture   Final    NO GROWTH 5 DAYS Performed at [facility]    Report Status 05/22/2016 FINAL  Final  Urine culture     Status: Abnormal   Collection Time: 05/17/16 10:09 PM  Result Value Ref Range Status   Specimen Description  URINE, CLEAN CATCH  Final   Special Requests NONE  Final   Culture MULTIPLE SPECIES PRESENT, SUGGEST RECOLLECTION (A)  Final   Report Status 05/19/2016 FINAL  Final     Medications:   . ciprofloxacin  500 mg Oral BID  . feeding supplement (ENSURE ENLIVE)  237 mL Oral BID BM  . finasteride  5 mg Oral Daily  . gabapentin  400 mg Oral QID  . insulin aspart  0-15 Units Subcutaneous TID WC  . lidocaine  1 patch Transdermal Q24H  . magnesium oxide  400 mg Oral Daily  . metroNIDAZOLE  500 mg Oral Q8H  . senna-docusate  1 tablet Oral BID  . sodium chloride flush  3 mL Intravenous Q12H   Continuous Infusions: . heparin 1,400 Units/hr (05/23/16 0749)    Time spent: 25 min   LOS: 6 days   Charlynne Cousins  [facility] Pager [phone]  *Please refer to [email or web address], password [email or web address] to get updated schedule on who will round on this patient, as hospitalists switch teams weekly. If 7PM-7AM, please contact night-coverage at [email or web address], password [email or web address] for any overnight needs.  05/23/2016, 11:49 AM

## 2016-05-23 NOTE — Progress Notes (Signed)
Pt had two pauses this morning- 2.4 and 2.61. Patient was asymptomatic. MD made aware. EKG done.   Callie Fielding RN

## 2016-05-23 NOTE — Progress Notes (Addendum)
Patient Name: Andrew Norton Date of Encounter: 05/23/2016  Primary Cardiologist: Dr. Sande Rives Problem List     Principal Problem:   Bile duct obstruction Active Problems:   HYPERKALEMIA   Hyperlipidemia   Constipation   Jaundice   Diabetes mellitus type 2, noninsulin dependent (HCC)   LFT elevation   Leukocytosis   Ampulla of Vater mass   Paroxysmal atrial fibrillation (HCC)   Protein-calorie malnutrition, severe   Abnormal CT of the abdomen   Tachy-brady syndrome (HCC)   Sinus pause   NSVT (nonsustained ventricular tachycardia) ([place])    Subjective   Reports having a mild chest discomfort this AM. Present since waking up this and constant since onset. Along sternum. Denies any associated palpitations or dyspnea with exertion. Ambulated down the hallway this AM with no acute worsening of his discomfort.   Inpatient Medications    Scheduled Meds: . ciprofloxacin  500 mg Oral BID  . feeding supplement (ENSURE ENLIVE)  237 mL Oral BID BM  . finasteride  5 mg Oral Daily  . gabapentin  400 mg Oral QID  . insulin aspart  0-15 Units Subcutaneous TID WC  . lidocaine  1 patch Transdermal Q24H  . magnesium oxide  400 mg Oral Daily  . metroNIDAZOLE  500 mg Oral Q8H  . senna-docusate  1 tablet Oral BID  . sodium chloride flush  3 mL Intravenous Q12H   Continuous Infusions: . heparin 1,400 Units/hr (05/23/16 0749)   PRN Meds: cyclobenzaprine, hydrALAZINE, HYDROcodone-acetaminophen   Vital Signs    Vitals:   05/22/16 1500 05/22/16 1510 05/22/16 2140 05/23/16 0654  BP: (!) 149/58 (!) 159/60 140/73 129/84  Pulse: 60 (!) 57 66 73  Resp: 13 10 12 15   Temp:   97.9 F (36.6 C) 97.8 F (36.6 C)  TempSrc:   Oral Oral  SpO2: 100% 100% 100% 98%  Weight:      Height:        Intake/Output Summary (Last 24 hours) at 05/23/16 0902 Last data filed at 05/22/16 2155  Gross per 24 hour  Intake             1490 ml  Output                0 ml  Net             1490 ml    Filed Weights   05/17/16 1743 05/22/16 1158  Weight: 154 lb 1.6 oz (69.9 kg) 154 lb (69.9 kg)    Physical Exam   GEN: Well nourished, well developed, elderly Caucasian male appearing in no acute distress.  HEENT: Grossly normal.  Neck: Supple, no JVD, carotid bruits, or masses. Cardiac: RRR, no murmurs, rubs, or gallops. No clubbing, cyanosis, edema.  Radials/DP/PT 2+ and equal bilaterally.  Respiratory:  Respirations regular and unlabored, clear to auscultation bilaterally. GI: Soft, nontender, nondistended, BS + x 4. MS: no deformity or atrophy. Skin: warm and dry, no rash. Neuro:  Strength and sensation are intact. Psych: AAOx3.  Normal affect.  Labs    CBC  Recent Labs  05/22/16 0511 05/23/16 0401  WBC 9.5 8.4  HGB 9.0* 8.2*  HCT 25.6* 23.1*  MCV 89.8 88.8  PLT 291 [id number]   Basic Metabolic Panel  Recent Labs  05/21/16 0340 05/22/16 0511  NA 135 133*  K 3.9 3.7  CL 101 99*  CO2 24 23  GLUCOSE 202* 217*  BUN 16 20  CREATININE 0.60* 0.72  CALCIUM  8.8* 8.5*  MG 1.4* 1.8   Liver Function Tests  Recent Labs  05/21/16 0340 05/23/16 0401  AST 88* 209*  ALT 105* 132*  ALKPHOS 454* 509*  BILITOT 6.1* 8.7*  PROT 6.5 5.7*  ALBUMIN 3.1* 2.7*     Telemetry    PAF with HR up to 140's at times, otherwise NSR with HR in 60's - 80's. Pauses up to 2.2 seconds noted - Personally Reviewed  ECG    Sinus tachycardia, HR 111, with 2 second pause noted.  - Personally Reviewed  Radiology    Dg Ercp Biliary & Pancreatic Ducts  Result Date: 05/22/2016 CLINICAL DATA:  Gallstone EXAM: ERCP TECHNIQUE: Multiple spot images obtained with the fluoroscopic device and submitted for interpretation post-procedure. FLUOROSCOPY TIME:  Fluoroscopy Time:  5 minutes and 12 seconds Radiation Exposure Index (if provided by the fluoroscopic device): 87 mGy Number of Acquired Spot Images: 6 COMPARISON:  None. FINDINGS: The common bile duct is markedly dilated. There is a filling  defect in the distal common bile duct. Contrast also fills the pancreatic duct. IMPRESSION: See above. These images were submitted for radiologic interpretation only. Please see the procedural report for the amount of contrast and the fluoroscopy time utilized. Electronically Signed   By: Marybelle Killings M.D.   On: 05/22/2016 16:03    Cardiac Studies   Echocardiogram: 05/19/2016 Study Conclusions  - Left ventricle: The cavity size was normal. Wall thickness was   increased in a pattern of mild LVH. Systolic function was normal.   The estimated ejection fraction was in the range of 55% to 60%.   Wall motion was normal; there were no regional wall motion   abnormalities. Doppler parameters are consistent with abnormal   left ventricular relaxation (grade 1 diastolic dysfunction). - Aortic valve: There was trivial regurgitation. - Mitral valve: Calcified annulus. - Pulmonary arteries: Systolic pressure was mildly increased. PA   peak pressure: 52 mm Hg (S).  Impressions:  - Normal LV systolic function; grade 1 diastolic dysfunction; trace   AI; mild TR with mildly elevated pulmonary pressure.  Patient Profile     82 yo M w/ PMH of  DM, HTN, cervical myelopathy with gait instability admitted with new onset jaundice noted to develop atrial fibrillation with rapid ventricular response which was asymptomatic (paroxysms of AF/NSR on tele this adm). Tele remarkable for occasional nonsustained bradycardia with intermittent post-conversion pauses (up to 5 sec on 05/20/16), AF RVR, and brief NSVT.  Assessment & Plan    1. 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   2. Paroxysmal atrial fib with RVR and Tachy-brady syndrome  - telemetry notable for post-conversion pauses  (up to 5 seconds on 11/5) with junctional bradycardia interspersed with AF RVR 140s. He is asymptomatic throughout. - BB and Diltiazem have both been held with recurrent pauses. Amiodarone is being avoided given his abnormal LFTs and jaundice. Dr. Tamala Julian recommends continual symptomatic management until GI prognosis is known. Patient and family wish to postpone PPM placement with him being asymptomatic with this.  - This patients CHA2DS2-VASc Score and unadjusted Ischemic Stroke Rate (% per year) is equal to 4.8 % stroke rate/year from a score of 4 (HTN, DM, Age (2)). He is being maintained on parenteral anticoagulation until all procedures are complete.  Not a good long-term anticoagulation candidate secondary to frequent falls.  - reports a mild "discomfort" along his chest this AM. Has been constant for 3+ hours. Not worsened by exertion. Will obtain repeat EKG.   3. NSVT  - quiescent with repletion of magnesium. Started on MagOx 400mg  daily.  4. Chronic Anemia - Hgb 8.2 this AM. Heparin held following ERCP yesterday and resumed this AM. Will need to be followed closely while on anticoagulation. Denies any evidence of active bleeding.  5. HTN  - well-controlled in the past 24 hours. - BB held due to bradycardia.   Signed, Erma Heritage, PA  05/23/2016, 9:02 AM  The patient has been seen in conjunction with [place], PA-C. All aspects of care have been considered and discussed. The patient has been personally interviewed, examined, and all clinical data has been reviewed.   Stable overnight. Monitor revealed no significant/prolonged arrhythmia or pauses. This afternoon, there is persistent tachycardia at 140-150 bpm. Also has some chest pain, atypical but concerning.  Cardiac plan is for restart of IV diltiazem and Pacer followed by medical therapy of tachycardia.  The patient was not on anticoagulation therapy prior to admission. History is somewhat concerning because he has  had at least 2 serious falls within the past 6 months(? Syncope). He would be at high risk for injury and bleeding on anticoagulation. Still considering.  Decided to get EP to see and tentatively planning pacer for Friday if no GI contraindication. We will know biopsy results before procedure.

## 2016-05-24 DIAGNOSIS — K839 Disease of biliary tract, unspecified: Secondary | ICD-10-CM

## 2016-05-24 LAB — GLUCOSE, CAPILLARY
GLUCOSE-CAPILLARY: 336 mg/dL — AB (ref 65–99)
Glucose-Capillary: 247 mg/dL — ABNORMAL HIGH (ref 65–99)
Glucose-Capillary: 342 mg/dL — ABNORMAL HIGH (ref 65–99)
Glucose-Capillary: 331 mg/dL — ABNORMAL HIGH (ref 65–99)
Glucose-Capillary: 385 mg/dL — ABNORMAL HIGH (ref 65–99)

## 2016-05-24 LAB — CBC
HCT: 25.4 % — ABNORMAL LOW (ref 39.0–52.0)
Hemoglobin: 9 g/dL — ABNORMAL LOW (ref 13.0–17.0)
MCH: 31.6 pg (ref 26.0–34.0)
MCHC: 35.4 g/dL (ref 30.0–36.0)
MCV: 89.1 fL (ref 78.0–100.0)
PLATELETS: 289 10*3/uL (ref 150–400)
RBC: 2.85 MIL/uL — AB (ref 4.22–5.81)
RDW: 14.6 % (ref 11.5–15.5)
WBC: 9.2 10*3/uL (ref 4.0–10.5)

## 2016-05-24 LAB — HEPARIN LEVEL (UNFRACTIONATED)
HEPARIN UNFRACTIONATED: 0.29 [IU]/mL — AB (ref 0.30–0.70)
Heparin Unfractionated: 0.51 IU/mL (ref 0.30–0.70)

## 2016-05-24 LAB — HEPATIC FUNCTION PANEL
ALBUMIN: 2.6 g/dL — AB (ref 3.5–5.0)
ALT: 153 U/L — ABNORMAL HIGH (ref 17–63)
AST: 232 U/L — ABNORMAL HIGH (ref 15–41)
Alkaline Phosphatase: 626 U/L — ABNORMAL HIGH (ref 38–126)
BILIRUBIN INDIRECT: 2.4 mg/dL — AB (ref 0.3–0.9)
Bilirubin, Direct: 4.1 mg/dL — ABNORMAL HIGH (ref 0.1–0.5)
TOTAL PROTEIN: 5.7 g/dL — AB (ref 6.5–8.1)
Total Bilirubin: 6.5 mg/dL — ABNORMAL HIGH (ref 0.3–1.2)

## 2016-05-24 MED ORDER — INSULIN GLARGINE 100 UNIT/ML ~~LOC~~ SOLN
5.0000 [IU] | Freq: Every day | SUBCUTANEOUS | Status: DC
  Administered 2016-05-24: 5 [IU] via SUBCUTANEOUS
  Filled 2016-05-24: qty 0.05

## 2016-05-24 MED ORDER — DILTIAZEM HCL ER COATED BEADS 120 MG PO CP24
120.0000 mg | ORAL_CAPSULE | Freq: Every day | ORAL | Status: DC
  Administered 2016-05-25 – 2016-05-27 (×3): 120 mg via ORAL
  Filled 2016-05-24 (×4): qty 1

## 2016-05-24 MED ORDER — FLECAINIDE ACETATE 50 MG PO TABS
50.0000 mg | ORAL_TABLET | Freq: Two times a day (BID) | ORAL | Status: DC
  Administered 2016-05-24 – 2016-05-30 (×12): 50 mg via ORAL
  Filled 2016-05-24 (×14): qty 1

## 2016-05-24 MED ORDER — RIVAROXABAN 20 MG PO TABS
20.0000 mg | ORAL_TABLET | Freq: Every day | ORAL | Status: DC
  Administered 2016-05-24 – 2016-05-30 (×5): 20 mg via ORAL
  Filled 2016-05-24 (×5): qty 1

## 2016-05-24 MED ORDER — INSULIN ASPART 100 UNIT/ML ~~LOC~~ SOLN
4.0000 [IU] | Freq: Once | SUBCUTANEOUS | Status: AC
  Administered 2016-05-25: 4 [IU] via SUBCUTANEOUS

## 2016-05-24 MED ORDER — METRONIDAZOLE 500 MG PO TABS
500.0000 mg | ORAL_TABLET | Freq: Three times a day (TID) | ORAL | Status: DC
  Administered 2016-05-24 – 2016-05-30 (×15): 500 mg via ORAL
  Filled 2016-05-24 (×19): qty 1

## 2016-05-24 MED ORDER — METRONIDAZOLE IN NACL 5-0.79 MG/ML-% IV SOLN: 500.0000 mg | Freq: Three times a day (TID) | INTRAVENOUS | Status: DC

## 2016-05-24 MED ORDER — DILTIAZEM HCL-DEXTROSE 100-5 MG/100ML-% IV SOLN (PREMIX)
5.0000 mg/h | INTRAVENOUS | Status: AC
  Administered 2016-05-25: 5 mg/h via INTRAVENOUS
  Filled 2016-05-24: qty 100

## 2016-05-24 MED ORDER — CIPROFLOXACIN IN D5W 400 MG/200ML IV SOLN
400.0000 mg | Freq: Two times a day (BID) | INTRAVENOUS | Status: DC
  Administered 2016-05-24 – 2016-05-29 (×11): 400 mg via INTRAVENOUS
  Filled 2016-05-24 (×14): qty 200

## 2016-05-24 MED ORDER — CIPROFLOXACIN IN D5W 400 MG/200ML IV SOLN
500.0000 mg | Freq: Two times a day (BID) | INTRAVENOUS | Status: DC
  Filled 2016-05-24: qty 400

## 2016-05-24 NOTE — Progress Notes (Signed)
Spoke with pt's wife and son at bedside concerning home health.  Pt's wife selected [facility] for HHRN/PT.  Referral given to in house rep.

## 2016-05-24 NOTE — Progress Notes (Signed)
Nutrition Follow-up  DOCUMENTATION CODES:   Severe malnutrition in context of acute illness/injury  INTERVENTION:  Continue Ensure Enlive po BID, each supplement provides 350 kcal and 20 grams of protein.  Encouraged adequate intake of calories and protein via meals and beverages. Patient reports he is getting tired of the foods allowed on heart healthy diet. Discussed he can bring in yogurt and other foods from home that he enjoys better than the sides on the menu here.   NUTRITION DIAGNOSIS:   Increased nutrient needs related to catabolic illness (cholangitis) as evidenced by estimated needs.  Ongoing.  GOAL:   Patient will meet greater than or equal to 90% of their needs  Not met.   MONITOR:   PO intake, Supplement acceptance, Labs, Weight trends, I & O's  REASON FOR ASSESSMENT:   Malnutrition Screening Tool    ASSESSMENT:   82 y.o. male with history of HTN, noninsulin dependent dm2, chronic constipation, B12 deficiency, neuropathy with gait instability is sent from [facility] clinic for direct admission. Patient is referred to [facility] by pmd due to new onset of jaundice and abnormal CT ab/pel scan which showed intrahepatic biliary ductal dilatation.  -ERCP showed biliary stone status post sphincterotomy and balloon distraction. Distal bile duct dilatation biopsies were done. Stent is not placed. Biopsy results are pending. -Plan for possible implantable pacemaker in setting of new-onset atrial fibrillation/tachy-brady syndrome.  Spoke with patient and wife at bedside. Patient reports he is feeling much better after ERCP. Appetite remains about the same. He has been ordering smaller meals, but able to finish all of these smaller meals. Assisted patient and wife with ordering lunch. He reports drinking 1 Ensure today so far with plans to drink another. Reports he did not have any Ensure yesterday because he forgot to ask for it.   Meal Completion: 100% of "smaller" meals per patient  and wife. He did not order lunch yesterday per [employer]. In the past 24 hours patient has had 1024 kcal (57% minimum estimated kcal needs) and 60 grams protein (67% minimum estimated protein needs).   Medications reviewed and include: Novolog sliding scale TID with meals, Lantus 5 units daily at bedtime, magnesium oxide 400 mg daily, senna.  Labs reviewed: CBG 196-342 pat 24 hrs. LFTs still trending up.  Diet Order:  Diet Heart Room service appropriate? Yes; Fluid consistency: Thin  Skin:  Reviewed, no issues  Last BM:  05/23/2016  Height:   Ht Readings from Last 1 Encounters:  05/22/16 5' 10" (1.778 m)    Weight:   Wt Readings from Last 1 Encounters:  05/22/16 154 lb (69.9 kg)    Ideal Body Weight:  75.45 kg  BMI:  Body mass index is 22.1 kg/m.  Estimated Nutritional Needs:   Kcal:  1800-2000 (MSJ x 1.3-1.5)  Protein:  90-105 grams (1.3-1.5 grams/kg)  Fluid:  >/= 1.7 L/day (25 ml/kg)  EDUCATION NEEDS:   Education needs addressed  Willey Blade, MS, RD, LDN Pager: [phone] After Hours Pager: [phone]

## 2016-05-24 NOTE — Consult Note (Signed)
ELECTROPHYSIOLOGY CONSULT NOTE    Primary Care Physician: Laurey Morale, MD Referring Physician:  Dr Tamala Julian  Admit Date: 05/17/2016  Reason for consultation:  Tachy/brady syndrome  Andrew Norton is a 82 y.o. male with a h/o DM, HTN, and prior stroke.  He is now admitted with biliary obstruction and is found to have afib with RVR in the setting of his medical illness.  He has had post termination pauses of up to 5 seconds but with suprisingly few symptoms.  He is now recovering s/p ERCP.    Today, he denies symptoms of palpitations, chest pain, shortness of breath, orthopnea, PND, lower extremity edema, dizziness, presyncope, syncope, or neurologic sequela. The patient is tolerating medications without difficulties and is otherwise without complaint today.   Past Medical History:  Diagnosis Date  . Anemia   . Arthritis    hips  . Atrial fibrillation ([place])   . Carpal tunnel syndrome    left leg sciatica  . Diabetes mellitus   . Hearing loss    left ear greater loss  . History of shingles    past hx. left hand  . Hyperlipidemia   . Hypertension    EKG, chest x ray 11/15/10 EPIC,  clearance Dr Sharlene Motts on chart/ pt states on meds to "prevent hypertension from diabetes"  . Paget's disease    scrotum  . Paroxysmal atrial fibrillation ([place]) 05/18/2016  . Positive PPD, treated    Past Surgical History:  Procedure Laterality Date  . CERVICAL FUSION     dr Vertell Limber 2009- retained hardware  . colonoscopy  2-08   per Dr. Oletta Lamas, normal   . ERCP N/A 05/22/2016   Procedure: ENDOSCOPIC RETROGRADE CHOLANGIOPANCREATOGRAPHY (ERCP);  Surgeon: Milus Banister, MD;  Location: [facility];  Service: Endoscopy;  Laterality: N/A;  . EYE SURGERY     bilateral cataract extraction with IOL  . HIP FRACTURE SURGERY  11-24-10   left hip per Dr, [name]  . LUMBAR LAMINECTOMY/DECOMPRESSION MICRODISCECTOMY  11/07/2011   Procedure: LUMBAR LAMINECTOMY/DECOMPRESSION MICRODISCECTOMY;  Surgeon: Johnn Hai,  MD;  Location: [facility];  Service: Orthopedics;  Laterality: N/A;  Decompression of the L4 - L5 Central (X-ray)   . partial removal of scrotum    . TONSILLECTOMY    . TOTAL HIP REVISION Left 11/05/2012   Procedure: Conversion of a Bipolar to a Left Total Hip Arthroplasty;  Surgeon: [name], MD;  Location: [facility];  Service: Orthopedics;  Laterality: Left;  Conversion of a Bipolar to a Left Total Hip Arthroplasty    . ciprofloxacin  500 mg Intravenous BID  . feeding supplement (ENSURE ENLIVE)  237 mL Oral BID BM  . finasteride  5 mg Oral Daily  . gabapentin  400 mg Oral QID  . insulin aspart  0-15 Units Subcutaneous TID WC  . insulin glargine  5 Units Subcutaneous QHS  . lidocaine  1 patch Transdermal Q24H  . magnesium oxide  400 mg Oral Daily  . metroNIDAZOLE  500 mg Oral Q8H  . senna-docusate  1 tablet Oral BID  . sodium chloride flush  3 mL Intravenous Q12H   . diltiazem (CARDIZEM) infusion 5 mg/hr (05/24/16 0529)  . heparin 1,500 Units/hr (05/24/16 [id number])    Allergies reviewed  Social History   Social History  . Marital status: Married    Spouse name: [name]  . Number of children: 3  . Years of education: College-BS   Occupational History  . retired Retired   Science writer History  Main Topics  . Smoking status: Never Smoker  . Smokeless tobacco: Never Used  . Alcohol use No  . Drug use: No  . Sexual activity: No   Other Topics Concern  . Not on file   Social History Narrative   Pt lives at home with wife [name])   Pt is right handed   Education college-BS   Pt states that he drinks 1 cup of coffee daily, and occ may have a cup of tea or soda    Family History  Problem Relation Age of Onset  . Tuberculosis      ROS- All systems are reviewed and negative except as per the HPI above  Physical Exam: Telemetry:  Sinus today Vitals:   05/23/16 1800 05/23/16 2127 05/24/16 0620 05/24/16 1352  BP: (!) 153/72 134/63 (!) 102/51 (!) 128/53  Pulse: 95 94 72 72    Resp:  18 18 18   Temp:  98.2 F (36.8 C) 99 F (37.2 C) 97.2 F (36.2 C)  TempSrc:  Oral Oral Oral  SpO2: 100% 98% 100%   Weight:      Height:        GEN- The patient is elderly and thin appearing, alert and oriented x 3 today.   Head- normocephalic, atraumatic Eyes-  Sclera clear, conjunctiva pink Ears- hearing intact Oropharynx- clear Neck- supple,   Lungs- Clear to ausculation bilaterally, normal work of breathing Heart- Regular rate and rhythm, no murmurs, rubs or gallops, PMI not laterally displaced GI- soft, NT, ND, + BS Extremities- no clubbing, cyanosis, or edema MS- diffuse muscle atrophy Skin- mild jaundice Psych- euthymic mood, full affect Neuro- strength and sensation are intact  EKGs are reviewed.  AF and atrial flutter are observed  Labs:   Lab Results  Component Value Date   WBC 9.2 05/24/2016   HGB 9.0 (L) 05/24/2016   HCT 25.4 (L) 05/24/2016   MCV 89.1 05/24/2016   PLT 289 05/24/2016    Recent Labs Lab 05/22/16 0511  05/24/16 0521  NA 133*  --   --   K 3.7  --   --   CL 99*  --   --   CO2 23  --   --   BUN 20  --   --   CREATININE 0.72  --   --   CALCIUM 8.5*  --   --   PROT  --   < > 5.7*  BILITOT  --   < > 6.5*  ALKPHOS  --   < > 626*  ALT  --   < > 153*  AST  --   < > 232*  GLUCOSE 217*  --   --   < > = values in this interval not displayed. Lab Results  Component Value Date   CKTOTAL 281 (H) 11/12/2010   CKMB 4.3 (H) 11/12/2010   TROPONINI 0.01        NO INDICATION OF MYOCARDIAL INJURY. 11/11/2010    Lab Results  Component Value Date   CHOL 152 10/05/2015   CHOL 112 04/16/2015   CHOL 87 07/14/2014   Lab Results  Component Value Date   HDL 43.00 10/05/2015   HDL 29 (L) 04/16/2015   HDL 35.30 (L) 07/14/2014   Lab Results  Component Value Date   LDLCALC 72 10/05/2015   LDLCALC 54 04/16/2015   LDLCALC 27 07/14/2014   Lab Results  Component Value Date   TRIG 184.0 (H) 10/05/2015   TRIG 147 04/16/2015  TRIG 123.0  07/14/2014   Lab Results  Component Value Date   CHOLHDL 4 10/05/2015   CHOLHDL 3.9 04/16/2015   CHOLHDL 2 07/14/2014   No results found for: LDLDIRECT   Echo:  reviewed  ASSESSMENT AND PLAN:   1. Tachy/brady  The patient has both tachycardia with afib as well as post termination pauses but with surprisingly few symptoms.  While in sinus rhythm, his rate are fine.  It may be that if we could control his AF with an AAD that he could avoid PPM implantation.  I did discuss the possiblility of a PPM with the patient and his son.  Risks of procedure were discussed at length.  At this time, the patient is leaning towards conservative therapy with an AAD.  If he decides that he would prefer a PPM then we could consider this as an alternative.  I have discussed plan with Dr Tamala Julian who agrees.  2. Paroxysmal atrial fibrillation As above Will start AAD therapy.  I had planned amiodarone however with persistently elevated LFTS, will opt for flecainide instead.  He has no known CAD. Start flecainide 50mg  BID. Convert diltiazem to oral in am Given long term stroke risk, will start on NOAC therapy with xarelto.  CrCl 71.  Will stop IV heparin.  I have spoken with hospitalist who agrees with this approach.   Very complicated patient with multiple acute medical issues.  A high level of decision making was required for this encounter.  Thompson Grayer, MD 05/24/2016  5:56 PM

## 2016-05-24 NOTE — Progress Notes (Signed)
[facility] PROGRESS NOTE    Progress Note  Andrew Norton  [id number] DOB: 06/24/1927 DOA: 05/17/2016 PCP: Laurey Morale, MD     Brief Narrative:   Andrew Norton is an 82 y.o. male past medical history of essential hypertension, non-insulin-dependent diabetes mellitus, neuropathy with gait instability they came into the hospital with new onset jaundice with an abdominal CT scan showed intrahepatic and biliary ductal dilation.  Assessment/Plan:   Ampullary lesion due to 1.2 cm mm Bile duct obstruction ERCP was done that showed biliary stone status post sphincterotomy and balloon distraction. Distal bile duct dilatation biopsies were done. Stent is not placed. Biopsy results are pending. LFTs are trending up change antibiotics to IV due to concern of possible cholangitis.  New-onset atrial fibrillation/Tachy-brady syndrome [facility]): No known low-dose diltiazem drip with heart rate control. Cardiology consult EP for possible implantable pacemaker. Cardiology deferred to concerned urology.  Leukocytosis: Afebrile, on Cipro and flagyl.   HYPERKALEMIA Resolved, , magnesium 1.8.  Diabetes mellitus type 2, noninsulin dependent (HCC) A1c of 7.6 continue hold oral hypoglycemic agents, continue sliding scale insulin.  Protein-calorie malnutrition, severe  NSVT (nonsustained ventricular tachycardia) (HCC)  Essential hypertension: Continue hold Cozaar and diltiazem, blood pressure seems to be stable  Chronic constipation: Continue stool softener.  DVT prophylaxis: heparin Family Communication: Wife Disposition Plan/Barrier to D/C: unable to determine Code Status:     Code Status Orders        Start     Ordered   05/17/16 1829  Full code  Continuous     05/17/16 1828    Code Status History    Date Active Date Inactive Code Status Order ID Comments User Context   04/13/2015 12:44 AM 04/16/2015  3:42 PM Full Code [id number]  Allyne Gee, MD Inpatient   11/05/2012  7:13  PM 11/08/2012  4:53 PM Full Code [id number]  [name], MD Inpatient    Advance Directive Documentation   Flowsheet Row Most Recent Value  Type of Advance Directive  Healthcare Power of Attorney, Living will  Pre-existing out of facility DNR order (yellow form or pink MOST form)  No data  "MOST" Form in Place?  No data        IV Access:    Peripheral IV   Procedures and diagnostic studies:   US Abdomen Complete  Result Date: 05/23/2016 CLINICAL DATA:  Recent common bile duct calculus EXAM: ABDOMEN ULTRASOUND COMPLETE COMPARISON:  ERCP May 22, 2016. FINDINGS: Gallbladder: No gallstones are evident. The gallbladder wall is thickened and edematous. There is no pericholecystic fluid. No sonographic Murphy sign noted by sonographer. Common bile duct: Diameter: 13 mm, dilated. There is moderate edema in the common hepatic and common bile duct. No mass or calculus is seen by ultrasound currently in the biliary ductal system. A small amount of air in the intrahepatic ducts may be due to the recent common bile duct manipulation. The intrahepatic biliary ducts do not appear appreciably dilated by ultrasound. Liver: No focal lesion identified. Liver echogenicity is overall increased. IVC: No abnormality visualized. Pancreas: Visualized portion unremarkable. Portions of the pancreatic tail are obscured by gas. Spleen: Size and appearance within normal limits. Right Kidney: Length: 12.6 cm. Echogenicity within normal limits. No mass or hydronephrosis visualized. There is renal cortical thinning. Left Kidney: Length: 11.6 cm. Echogenicity within normal limits. No mass or hydronephrosis visualized. There is renal cortical thinning. Abdominal aorta: No aneurysm visualized. Other findings: No demonstrable ascites. IMPRESSION: Gallbladder wall is thickened and  edematous. No gallstones seen. A degree of acalculus cholecystitis must be of concern. Common bile duct remains dilated without mass or calculus  evident by ultrasound. There is edema in the wall of the common bile duct. A small amount of air in the intrahepatic biliary ductal system is likely secondary to the recent biliary ductal manipulation. Portions of pancreas obscured by gas. Visualized portions of pancreas appear normal. Renal cortical thinning bilaterally may be a function of age or may be seen with medical renal disease. Kidneys otherwise appear unremarkable. Study otherwise unremarkable. Electronically Signed   By: Lowella Grip III M.D.   On: 05/23/2016 15:19   Dg Ercp Biliary & Pancreatic Ducts  Result Date: 05/22/2016 CLINICAL DATA:  Gallstone EXAM: ERCP TECHNIQUE: Multiple spot images obtained with the fluoroscopic device and submitted for interpretation post-procedure. FLUOROSCOPY TIME:  Fluoroscopy Time:  5 minutes and 12 seconds Radiation Exposure Index (if provided by the fluoroscopic device): 87 mGy Number of Acquired Spot Images: 6 COMPARISON:  None. FINDINGS: The common bile duct is markedly dilated. There is a filling defect in the distal common bile duct. Contrast also fills the pancreatic duct. IMPRESSION: See above. These images were submitted for radiologic interpretation only. Please see the procedural report for the amount of contrast and the fluoroscopy time utilized. Electronically Signed   By: Marybelle Killings M.D.   On: 05/22/2016 16:03     Medical Consultants:    None.  Anti-Infectives:   None  Subjective:    Andrew Norton he relates no discomfort this morning.   Objective:    Vitals:   05/23/16 1740 05/23/16 1800 05/23/16 2127 05/24/16 0620  BP: (!) 155/74 (!) 153/72 134/63 (!) 102/51  Pulse: 69 95 94 72  Resp:   18 18  Temp:   98.2 F (36.8 C) 99 F (37.2 C)  TempSrc:   Oral Oral  SpO2: 98% 100% 98% 100%  Weight:      Height:        Intake/Output Summary (Last 24 hours) at 05/24/16 1136 Last data filed at 05/24/16 0620  Gross per 24 hour  Intake           622.27 ml  Output               700 ml  Net           -77.73 ml   Filed Weights   05/17/16 1743 05/22/16 1158  Weight: 69.9 kg (154 lb 1.6 oz) 69.9 kg (154 lb)    Exam: General exam: In no acute distress. Respiratory system: Good air movement and clear to auscultation. Cardiovascular system: S1 & S2 heard, RRR. Gastrointestinal system: Abdomen is nondistended, soft and nontender.  Central nervous system: Alert and oriented. No focal neurological deficits. Extremities: No pedal edema. Skin: No rashes, lesions or ulcers Psychiatry: Judgement and insight appear normal. Mood & affect appropriate.    Data Reviewed:    Labs: Basic Metabolic Panel:  Recent Labs Lab 05/18/16 0357 05/19/16 0401 05/21/16 0340 05/22/16 0511  NA 132* 133* 135 133*  K 4.3 3.8 3.9 3.7  CL 102 103 101 99*  CO2 22 23 24 23   GLUCOSE 103* 164* 202* 217*  BUN 18 11 16 20   CREATININE 0.76 0.64 0.60* 0.72  CALCIUM 8.1* 8.0* 8.8* 8.5*  MG  --   --  1.4* 1.8   GFR Estimated Creatinine Clearance: 64.3 mL/min (by C-G formula based on SCr of 0.72 mg/dL). Liver Function Tests:  Recent Labs  Lab 05/18/16 0357 05/21/16 0340 05/23/16 0401 05/24/16 0521  AST 169* 88* 209* 232*  ALT 148* 105* 132* 153*  ALKPHOS 523* 454* 509* 626*  BILITOT 4.5* 6.1* 8.7* 6.5*  PROT 6.0* 6.5 5.7* 5.7*  ALBUMIN 3.0* 3.1* 2.7* 2.6*   No results for input(s): LIPASE, AMYLASE in the last 168 hours. No results for input(s): AMMONIA in the last 168 hours. Coagulation profile  Recent Labs Lab 05/18/16 0357  INR 1.20    CBC:  Recent Labs Lab 05/18/16 0357  05/20/16 0527 05/21/16 0340 05/22/16 0511 05/23/16 0401 05/24/16 0521  WBC 8.8  < > 6.8 8.6 9.5 8.4 9.2  NEUTROABS 7.0  --   --   --   --   --   --   HGB 8.0*  < > 9.5* 9.1* 9.0* 8.2* 9.0*  HCT 23.5*  < > 27.5* 26.3* 25.6* 23.1* 25.4*  MCV 91.8  < > 90.8 90.7 89.8 88.8 89.1  PLT 272  < > 389 342 291 255 289  < > = values in this interval not displayed. Cardiac Enzymes: No results  for input(s): CKTOTAL, CKMB, CKMBINDEX, TROPONINI in the last 168 hours. BNP (last 3 results) No results for input(s): PROBNP in the last 8760 hours. CBG:  Recent Labs Lab 05/23/16 0742 05/23/16 1313 05/23/16 1715 05/23/16 2120 05/24/16 0906  GLUCAP 186* 229* 196* 209* 247*   D-Dimer: No results for input(s): DDIMER in the last 72 hours. Hgb A1c: No results for input(s): HGBA1C in the last 72 hours. Lipid Profile: No results for input(s): CHOL, HDL, LDLCALC, TRIG, CHOLHDL, LDLDIRECT in the last 72 hours. Thyroid function studies: No results for input(s): TSH, T4TOTAL, T3FREE, THYROIDAB in the last 72 hours.  Invalid input(s): FREET3 Anemia work up: No results for input(s): VITAMINB12, FOLATE, FERRITIN, TIBC, IRON, RETICCTPCT in the last 72 hours. Sepsis Labs:  Recent Labs Lab 05/17/16 1834 05/17/16 2151  05/19/16 0401 05/20/16 0527 05/21/16 0340 05/22/16 0511 05/23/16 0401 05/24/16 0521  PROCALCITON  --   --   < > 2.48 1.13 0.73 0.79  --   --   WBC  --   --   < > 8.3 6.8 8.6 9.5 8.4 9.2  LATICACIDVEN 3.0* 1.7  --   --   --   --   --   --   --   < > = values in this interval not displayed. Microbiology Recent Results (from the past 240 hour(s))  Culture, blood (routine x 2)     Status: None   Collection Time: 05/17/16  6:33 PM  Result Value Ref Range Status   Specimen Description BLOOD RIGHT ARM  Final   Special Requests BOTTLES DRAWN AEROBIC AND ANAEROBIC 5CC  Final   Culture   Final    NO GROWTH 5 DAYS Performed at [facility]    Report Status 05/22/2016 FINAL  Final  Culture, blood (routine x 2)     Status: None   Collection Time: 05/17/16  6:33 PM  Result Value Ref Range Status   Specimen Description BLOOD LEFT ARM  Final   Special Requests IN PEDIATRIC BOTTLE 4CC  Final   Culture   Final    NO GROWTH 5 DAYS Performed at [facility]    Report Status 05/22/2016 FINAL  Final  Urine culture     Status: Abnormal   Collection Time:  05/17/16 10:09 PM  Result Value Ref Range Status   Specimen Description URINE, CLEAN CATCH  Final  Special Requests NONE  Final   Culture MULTIPLE SPECIES PRESENT, SUGGEST RECOLLECTION (A)  Final   Report Status 05/19/2016 FINAL  Final     Medications:   . ciprofloxacin  500 mg Intravenous BID  . feeding supplement (ENSURE ENLIVE)  237 mL Oral BID BM  . finasteride  5 mg Oral Daily  . gabapentin  400 mg Oral QID  . insulin aspart  0-15 Units Subcutaneous TID WC  . lidocaine  1 patch Transdermal Q24H  . magnesium oxide  400 mg Oral Daily  . metronidazole  500 mg Intravenous Q8H  . senna-docusate  1 tablet Oral BID  . sodium chloride flush  3 mL Intravenous Q12H   Continuous Infusions: . diltiazem (CARDIZEM) infusion 5 mg/hr (05/24/16 0529)  . heparin 1,500 Units/hr (05/24/16 [id number])    Time spent: 15 min   LOS: 7 days   Charlynne Cousins  [facility] Pager [phone]  *Please refer to [email or web address], password [email or web address] to get updated schedule on who will round on this patient, as hospitalists switch teams weekly. If 7PM-7AM, please contact night-coverage at [email or web address], password [email or web address] for any overnight needs.  05/24/2016, 11:36 AM

## 2016-05-24 NOTE — Progress Notes (Signed)
Progress Note   Subjective  Chief Complaint: Jaundice. Biliary Obstruction, Duodenal Abnormality  Patient found sitting up in the chair by his bedside. He tells me that he had a bowel movement this morning. He is feeling much better. He explains that the cardiology team would like to put a pacemaker in him tomorrow and tells me that they're trying to get a hold of our service for recommendations. He denies any new complaints or concerns   Objective   Vital signs in last 24 hours: Temp:  [98.1 F (36.7 C)-99 F (37.2 C)] 99 F (37.2 C) (11/09 0620) Pulse Rate:  [69-95] 72 (11/09 0620) Resp:  [18] 18 (11/09 0620) BP: (102-170)/(51-74) 102/51 (11/09 0620) SpO2:  [98 %-100 %] 100 % (11/09 0620) Last BM Date: 05/23/16 General: Jaundice Caucasian male in NAD Heart:  Regular rate and rhythm; no murmurs Lungs: Respirations even and unlabored, lungs CTA bilaterally Abdomen:  Soft, nontender and nondistended. Normal bowel sounds. Extremities:  Without edema. Neurologic:  Alert and oriented,  grossly normal neurologically. Psych:  Cooperative. Normal mood and affect.  Intake/Output from previous day: 11/08 0701 - 11/09 0700 In: 652.8 [P.O.:360; I.V.:292.8] Out: 700 [Urine:700]  Lab Results:  Recent Labs  05/22/16 0511 05/23/16 0401 05/24/16 0521  WBC 9.5 8.4 9.2  HGB 9.0* 8.2* 9.0*  HCT 25.6* 23.1* 25.4*  PLT 291 255 289   BMET  Recent Labs  05/22/16 0511  NA 133*  K 3.7  CL 99*  CO2 23  GLUCOSE 217*  BUN 20  CREATININE 0.72  CALCIUM 8.5*   LFT  Recent Labs  05/24/16 0521  PROT 5.7*  ALBUMIN 2.6*  AST 232*  ALT 153*  ALKPHOS 626*  BILITOT 6.5*  BILIDIR 4.1*  IBILI 2.4*   PT/INR No results for input(s): LABPROT, INR in the last 72 hours.  Studies/Results: US Abdomen Complete  Result Date: 05/23/2016 CLINICAL DATA:  Recent common bile duct calculus EXAM: ABDOMEN ULTRASOUND COMPLETE COMPARISON:  ERCP May 22, 2016. FINDINGS: Gallbladder: No  gallstones are evident. The gallbladder wall is thickened and edematous. There is no pericholecystic fluid. No sonographic Murphy sign noted by sonographer. Common bile duct: Diameter: 13 mm, dilated. There is moderate edema in the common hepatic and common bile duct. No mass or calculus is seen by ultrasound currently in the biliary ductal system. A small amount of air in the intrahepatic ducts may be due to the recent common bile duct manipulation. The intrahepatic biliary ducts do not appear appreciably dilated by ultrasound. Liver: No focal lesion identified. Liver echogenicity is overall increased. IVC: No abnormality visualized. Pancreas: Visualized portion unremarkable. Portions of the pancreatic tail are obscured by gas. Spleen: Size and appearance within normal limits. Right Kidney: Length: 12.6 cm. Echogenicity within normal limits. No mass or hydronephrosis visualized. There is renal cortical thinning. Left Kidney: Length: 11.6 cm. Echogenicity within normal limits. No mass or hydronephrosis visualized. There is renal cortical thinning. Abdominal aorta: No aneurysm visualized. Other findings: No demonstrable ascites. IMPRESSION: Gallbladder wall is thickened and edematous. No gallstones seen. A degree of acalculus cholecystitis must be of concern. Common bile duct remains dilated without mass or calculus evident by ultrasound. There is edema in the wall of the common bile duct. A small amount of air in the intrahepatic biliary ductal system is likely secondary to the recent biliary ductal manipulation. Portions of pancreas obscured by gas. Visualized portions of pancreas appear normal. Renal cortical thinning bilaterally may be a function of age or  may be seen with medical renal disease. Kidneys otherwise appear unremarkable. Study otherwise unremarkable. Electronically Signed   By: Lowella Grip III M.D.   On: 05/23/2016 15:19   Dg Ercp Biliary & Pancreatic Ducts  Result Date: 05/22/2016 CLINICAL  DATA:  Gallstone EXAM: ERCP TECHNIQUE: Multiple spot images obtained with the fluoroscopic device and submitted for interpretation post-procedure. FLUOROSCOPY TIME:  Fluoroscopy Time:  5 minutes and 12 seconds Radiation Exposure Index (if provided by the fluoroscopic device): 87 mGy Number of Acquired Spot Images: 6 COMPARISON:  None. FINDINGS: The common bile duct is markedly dilated. There is a filling defect in the distal common bile duct. Contrast also fills the pancreatic duct. IMPRESSION: See above. These images were submitted for radiologic interpretation only. Please see the procedural report for the amount of contrast and the fluoroscopy time utilized. Electronically Signed   By: Marybelle Killings M.D.   On: 05/22/2016 16:03     Assessment / Plan:    Assessment: 1. Painless Jaundice: biliary obstruction-pt s/p ERCP 05/22/16-stone removed,stent unable to be placed, LFT's continue to rise today, bili has decreased to 6.5, per discussion with Dr. Ardis Hughs there was some slight purulent material at time of ERCP, changed patient's antibiotics to IV today; discussed patient that pathology/brushings was negative 2. Duodenal Mass: See above 3. Elevated transaminases  Plan: 1. Continue to observe and monitor LFT's, reordered for tomorrow morning 2. Continue supportive measures 3. Changed patient's antibiotics to IV ciprofloxacin, continue oral flagyl (per pharmacy on national backorder and oral bioavailability the same as IV) due to rise in LFTs and concern for possible cholangitis 4. Discussed planned pacemaker placement with Dr. Ardis Hughs and he agrees that there is no contraindication for this. May proceed as planned tomorrow. 5. Will discuss above with Dr. Ardis Hughs, please await any further recommendations  Thank you for your kind consultation, we will continue to follow   LOS: 7 days   Levin Erp  05/24/2016, 10:57 AM  Pager #  [phone]   ________________________________________________________________________  Velora Heckler GI MD note:  I personally examined the patient, reviewed the data and agree with the assessment and plan described above.  Biopsies showed no sign of cancer and he clearly had a large CBD stone that is now removed.  LFTs are mainly improving, given that there was some slight purulence noted during ERCP I'm changing him to IV cipro for 24-48 hours.  I think his LFTs will continue to drop, Ok to go ahead with any cardiology testing, procedures that are necessary. We will continue to follow along.   Owens Loffler, MD [facility] Pager [phone]

## 2016-05-24 NOTE — Progress Notes (Signed)
Inpatient Diabetes Program Recommendations  AACE/ADA: New Consensus Statement on Inpatient Glycemic Control (2015)  Target Ranges:  Prepandial:   less than 140 mg/dL      Peak postprandial:   less than 180 mg/dL (1-2 hours)      Critically ill patients:  140 - 180 mg/dL   Results for RICHTER, ROZEK (MRN [id number]) as of 05/24/2016 11:02  Ref. Range 05/23/2016 07:42 05/23/2016 13:13 05/23/2016 17:15 05/23/2016 21:20  Glucose-Capillary Latest Ref Range: 65 - 99 mg/dL 186 (H) 229 (H) 196 (H) 209 (H)   Results for ARDIN, NIEL (MRN [id number]) as of 05/24/2016 11:02  Ref. Range 05/24/2016 09:06  Glucose-Capillary Latest Ref Range: 65 - 99 mg/dL 247 (H)    Home DM Meds: Metformin 1000 mg BID                             Glipizide 10 mg BID                             Januvia 100 mg daily  Current Insulin Orders: Novolog Moderate Correction Scale/ SSI (0-15 units) TID AC     MD- Please consider the following in-hospital insulin adjustments while patient's home oral DM meds are on hold:  1. Start low dose basal insulin: Lantus 10 units daily (0.15 units/kg dosing based on weight of 70kg)  2. Start Novolog Meal Coverage: Novolog 3 units TIDWC (hold if pt eats <50% of meal)     --Will follow patient during hospitalization--  Wyn Quaker RN, MSN, CDE Diabetes Coordinator Inpatient Glycemic Control Team Team Pager: [phone] (8a-5p)

## 2016-05-24 NOTE — Progress Notes (Signed)
ANTICOAGULATION CONSULT NOTE - Follow Up Consult  Pharmacy Consult for Heparin Indication: atrial fibrillation    Patient Measurements: Height: 5\' 10"  (177.8 cm) Weight: 154 lb (69.9 kg) IBW/kg (Calculated) : 73 Heparin Dosing Weight:   Vital Signs: Temp: 98.2 F (36.8 C) (11/08 2127) Temp Source: Oral (11/08 2127) BP: 134/63 (11/08 2127) Pulse Rate: 94 (11/08 2127)  Labs:  Recent Labs  05/22/16 0511 05/23/16 0401 05/23/16 1636 05/24/16 0521  HGB 9.0* 8.2*  --  9.0*  HCT 25.6* 23.1*  --  25.4*  PLT 291 255  --  289  HEPARINUNFRC 0.50  --  0.33 0.29*  CREATININE 0.72  --   --   --     Estimated Creatinine Clearance: 64.3 mL/min (by C-G formula based on SCr of 0.72 mg/dL).   Medications:  Infusions:  . diltiazem (CARDIZEM) infusion 5 mg/hr (05/24/16 0529)  . heparin 1,400 Units/hr (05/23/16 0749)    Assessment: Patient with heparin level just below goal.  No heparin issues per RN.  Goal of Therapy:  Heparin level 0.3-0.7 units/ml Monitor platelets by anticoagulation protocol: Yes   Plan:  Increase heparin to 1500 units/hr Recheck heparin level at [place], [place] 05/24/2016,6:21 AM

## 2016-05-24 NOTE — Progress Notes (Signed)
Patient Name: Andrew Norton Date of Encounter: 05/24/2016  Primary Cardiologist: Dr. Sande Rives Problem List     Principal Problem:   Bile duct obstruction Active Problems:   HYPERKALEMIA   Hyperlipidemia   Constipation   Jaundice   Diabetes mellitus type 2, noninsulin dependent (HCC)   LFT elevation   Leukocytosis   Ampulla of Vater mass   Paroxysmal atrial fibrillation (HCC)   Protein-calorie malnutrition, severe   Abnormal CT of the abdomen   Tachy-brady syndrome (HCC)   Sinus pause   NSVT (nonsustained ventricular tachycardia) (HCC)    Subjective   Denies any repeat episodes of chest discomfort. No palpitations.   Inpatient Medications    Scheduled Meds: . ciprofloxacin  500 mg Intravenous BID  . feeding supplement (ENSURE ENLIVE)  237 mL Oral BID BM  . finasteride  5 mg Oral Daily  . gabapentin  400 mg Oral QID  . insulin aspart  0-15 Units Subcutaneous TID WC  . insulin glargine  5 Units Subcutaneous QHS  . lidocaine  1 patch Transdermal Q24H  . magnesium oxide  400 mg Oral Daily  . metroNIDAZOLE  500 mg Oral Q8H  . senna-docusate  1 tablet Oral BID  . sodium chloride flush  3 mL Intravenous Q12H   Continuous Infusions: . diltiazem (CARDIZEM) infusion 5 mg/hr (05/24/16 0529)  . heparin 1,500 Units/hr (05/24/16 [id number])   PRN Meds: cyclobenzaprine, hydrALAZINE, HYDROcodone-acetaminophen   Vital Signs    Vitals:   05/23/16 1740 05/23/16 1800 05/23/16 2127 05/24/16 0620  BP: (!) 155/74 (!) 153/72 134/63 (!) 102/51  Pulse: 69 95 94 72  Resp:   18 18  Temp:   98.2 F (36.8 C) 99 F (37.2 C)  TempSrc:   Oral Oral  SpO2: 98% 100% 98% 100%  Weight:      Height:        Intake/Output Summary (Last 24 hours) at 05/24/16 1329 Last data filed at 05/24/16 1000  Gross per 24 hour  Intake           682.27 ml  Output              700 ml  Net           -17.73 ml   Filed Weights   05/17/16 1743 05/22/16 1158  Weight: 154 lb 1.6 oz (69.9 kg) 154 lb  (69.9 kg)    Physical Exam   GEN: Well nourished, well developed, elderly Caucasian male appearing in no acute distress.  HEENT: Grossly normal.  Neck: Supple, no JVD, carotid bruits, or masses. Cardiac: RRR, no murmurs, rubs, or gallops. No clubbing, cyanosis, edema.  Radials/DP/PT 2+ and equal bilaterally.  Respiratory:  Respirations regular and unlabored, clear to auscultation bilaterally. GI: Soft, nontender, nondistended, BS + x 4. MS: no deformity or atrophy. Skin: warm and dry, no rash. Neuro:  Strength and sensation are intact. Psych: AAOx3.  Normal affect.  Labs    CBC  Recent Labs  05/23/16 0401 05/24/16 0521  WBC 8.4 9.2  HGB 8.2* 9.0*  HCT 23.1* 25.4*  MCV 88.8 89.1  PLT 255 [id number]   Basic Metabolic Panel  Recent Labs  05/22/16 0511  NA 133*  K 3.7  CL 99*  CO2 23  GLUCOSE 217*  BUN 20  CREATININE 0.72  CALCIUM 8.5*  MG 1.8   Liver Function Tests  Recent Labs  05/23/16 0401 05/24/16 0521  AST 209* 232*  ALT 132* 153*  ALKPHOS 509*  626*  BILITOT 8.7* 6.5*  PROT 5.7* 5.7*  ALBUMIN 2.7* 2.6*     Telemetry    PAF with HR up to 130's at times. 2.39 second pause this AM. Currently in NSR, HR 60's - 70's with frequent PVC's. - Personally Reviewed  ECG    Atrial flutter with 2:1 conduction, HR 137.  - Personally Reviewed  Radiology    Dg Ercp Biliary & Pancreatic Ducts  Result Date: 05/22/2016 CLINICAL DATA:  Gallstone EXAM: ERCP TECHNIQUE: Multiple spot images obtained with the fluoroscopic device and submitted for interpretation post-procedure. FLUOROSCOPY TIME:  Fluoroscopy Time:  5 minutes and 12 seconds Radiation Exposure Index (if provided by the fluoroscopic device): 87 mGy Number of Acquired Spot Images: 6 COMPARISON:  None. FINDINGS: The common bile duct is markedly dilated. There is a filling defect in the distal common bile duct. Contrast also fills the pancreatic duct. IMPRESSION: See above. These images were submitted for  radiologic interpretation only. Please see the procedural report for the amount of contrast and the fluoroscopy time utilized. Electronically Signed   By: Marybelle Killings M.D.   On: 05/22/2016 16:03    Cardiac Studies   Echocardiogram: 05/19/2016 Study Conclusions  - Left ventricle: The cavity size was normal. Wall thickness was   increased in a pattern of mild LVH. Systolic function was normal.   The estimated ejection fraction was in the range of 55% to 60%.   Wall motion was normal; there were no regional wall motion   abnormalities. Doppler parameters are consistent with abnormal   left ventricular relaxation (grade 1 diastolic dysfunction). - Aortic valve: There was trivial regurgitation. - Mitral valve: Calcified annulus. - Pulmonary arteries: Systolic pressure was mildly increased. PA   peak pressure: 52 mm Hg (S).  Impressions:  - Normal LV systolic function; grade 1 diastolic dysfunction; trace   AI; mild TR with mildly elevated pulmonary pressure.  Patient Profile     82 yo M w/ PMH of  DM, HTN, cervical myelopathy with gait instability admitted with new onset jaundice noted to develop atrial fibrillation with rapid ventricular response which was asymptomatic (paroxysms of AF/NSR on tele this adm). Tele remarkable for occasional nonsustained bradycardia with intermittent post-conversion pauses (up to 5 sec on 05/20/16), AF RVR, and brief NSVT.  Assessment & Plan    1. 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 Pathology negative for malignancy. - GI following elevated LFT's. No contraindications for possible PPM placement.  2. Paroxysmal atrial fib with RVR and Tachy-brady syndrome  - telemetry notable for post-conversion pauses (up to 5 seconds on 11/5) with junctional  bradycardia interspersed with AF RVR 140s. He is asymptomatic throughout. - BB and Diltiazem were initially held with recurrent pauses. Became tachycardiac into the 140's yesterday afternoon and placed on IV Cardizem. Had a 2.39 second pause this AM. Currently in SR with HR in 60's - 70's and remains on IV Cardizem 5mg /hr.   - Amiodarone is being avoided given his abnormal LFTs and jaundice. - This patients CHA2DS2-VASc Score and unadjusted Ischemic Stroke Rate (% per year) is equal to 4.8 % stroke rate/year from a score of 4 (HTN, DM, Age (2)). He is being maintained on parenteral anticoagulation until all procedures are complete. Not a good long-term anticoagulation candidate secondary to frequent falls.  -  with normal path report, plans are for tentative PPM placement tomorrow. Will contact EP to confirm this.  3. NSVT  - quiescent with repletion of magnesium. Started on MagOx 400mg  daily.  4. Chronic Anemia - Hgb 9.0 this AM. Continue to follow closely while on anticoagulation. Denies any evidence of active bleeding.  5. HTN  - variable at 102/51 - 170/74 in the past 24 hours. - BB held due to bradycardia.   Signed, Erma Heritage, PA  05/24/2016, 1:29 PM  The patient has been seen in conjunction with Bernerd Pho, PA. All aspects of care have been considered and discussed. The patient has been personally interviewed, examined, and all clinical data has been reviewed.   The note is correct as dictated. Will verify that EP will see the patient today and consider pacemaker placement tomorrow.

## 2016-05-24 NOTE — Progress Notes (Signed)
ANTICOAGULATION CONSULT NOTE - Follow Up Consult  Pharmacy Consult for Heparin Indication: atrial fibrillation  Patient Measurements: Height: 5\' 10"  (177.8 cm) Weight: 154 lb (69.9 kg) IBW/kg (Calculated) : 73 Heparin Dosing Weight: actual weight  Vital Signs: Temp: 97.2 F (36.2 C) (11/09 1352) Temp Source: Oral (11/09 1352) BP: 128/53 (11/09 1352) Pulse Rate: 72 (11/09 1352)  Labs:  Recent Labs  05/22/16 0511 05/23/16 0401 05/23/16 1636 05/24/16 0521  HGB 9.0* 8.2*  --  9.0*  HCT 25.6* 23.1*  --  25.4*  PLT 291 255  --  289  HEPARINUNFRC 0.50  --  0.33 0.29*  CREATININE 0.72  --   --   --     Estimated Creatinine Clearance: 64.3 mL/min (by C-G formula based on SCr of 0.72 mg/dL).   Medications:  Infusions:  . diltiazem (CARDIZEM) infusion 5 mg/hr (05/24/16 0529)  . heparin 1,500 Units/hr (05/24/16 [id number])    Assessment: 12 YOM with new onset jaundice who was directly admitted on 11/2 from [place]. CT revealed intrahepatic biliary ductal dilatation. Planned ERCP 11/3, but developed new onset Afib and the procedure was cancelled and Heparin infusion started on 11/3.   Significant events: 11/7 Heparin held for ERCP, GI recommended holding until 11/8 11/8 Heparin restarted at 0800  Today, 05/24/2016:  Heparin level 0.51  CBC: Hgb improved, Plt WNL   No bleeding or infusion issues per RN  Goal of Therapy:  Heparin level 0.3-0.7 units/ml Monitor platelets by anticoagulation protocol: Yes   Plan:   Continue heparin IV infusion at 1500 units/hr  Recheck heparin level in 8 hours to confirm therapeutic rate  Daily heparin level and CBC  Follow up plan for anticoagulation around pacemaker placement on 11/10  Follow up plans for long term anticoagulation  Gretta Arab PharmD, BCPS Pager [phone] 05/24/2016 3:42 PM

## 2016-05-24 NOTE — Progress Notes (Signed)
Pt and wife asking for Hospital Bed, MD will need order of BED, [facility] and face to face please.

## 2016-05-25 ENCOUNTER — Other Ambulatory Visit: Payer: Self-pay

## 2016-05-25 ENCOUNTER — Encounter (HOSPITAL_COMMUNITY)
Admission: AD | Disposition: A | Discharge status: Rehabilitation Facility | Payer: Self-pay | Source: Ambulatory Visit | Attending: Internal Medicine

## 2016-05-25 DIAGNOSIS — R17 Unspecified jaundice: Secondary | ICD-10-CM

## 2016-05-25 LAB — CBC
HCT: 22.8 % — ABNORMAL LOW (ref 39.0–52.0)
HEMOGLOBIN: 8.1 g/dL — AB (ref 13.0–17.0)
MCH: 31.5 pg (ref 26.0–34.0)
MCHC: 35.5 g/dL (ref 30.0–36.0)
MCV: 88.7 fL (ref 78.0–100.0)
PLATELETS: 261 10*3/uL (ref 150–400)
RBC: 2.57 MIL/uL — ABNORMAL LOW (ref 4.22–5.81)
RDW: 14.9 % (ref 11.5–15.5)
WBC: 9.7 10*3/uL (ref 4.0–10.5)

## 2016-05-25 LAB — GLUCOSE, CAPILLARY
GLUCOSE-CAPILLARY: 355 mg/dL — AB (ref 65–99)
Glucose-Capillary: 215 mg/dL — ABNORMAL HIGH (ref 65–99)
Glucose-Capillary: 442 mg/dL — ABNORMAL HIGH (ref 65–99)
Glucose-Capillary: 371 mg/dL — ABNORMAL HIGH (ref 65–99)

## 2016-05-25 LAB — COMPREHENSIVE METABOLIC PANEL
ALBUMIN: 2.6 g/dL — AB (ref 3.5–5.0)
ALT: 125 U/L — ABNORMAL HIGH (ref 17–63)
ANION GAP: 9 (ref 5–15)
AST: 118 U/L — ABNORMAL HIGH (ref 15–41)
Alkaline Phosphatase: 598 U/L — ABNORMAL HIGH (ref 38–126)
BUN: 29 mg/dL — ABNORMAL HIGH (ref 6–20)
CALCIUM: 7.8 mg/dL — AB (ref 8.9–10.3)
CHLORIDE: 98 mmol/L — AB (ref 101–111)
CO2: 24 mmol/L (ref 22–32)
Creatinine, Ser: 0.76 mg/dL (ref 0.61–1.24)
GFR calc non Af Amer: >60 mL/min (ref >60)
GLUCOSE: 216 mg/dL — AB (ref 65–99)
POTASSIUM: 3.3 mmol/L — AB (ref 3.5–5.1)
SODIUM: 131 mmol/L — AB (ref 135–145)
Total Bilirubin: 4.2 mg/dL — ABNORMAL HIGH (ref 0.3–1.2)
Total Protein: 5.9 g/dL — ABNORMAL LOW (ref 6.5–8.1)

## 2016-05-25 SURGERY — PACEMAKER IMPLANT
Anesthesia: LOCAL

## 2016-05-25 MED ORDER — INSULIN GLARGINE 100 UNIT/ML ~~LOC~~ SOLN
5.0000 [IU] | Freq: Two times a day (BID) | SUBCUTANEOUS | Status: DC
  Administered 2016-05-25 (×2): 5 [IU] via SUBCUTANEOUS
  Filled 2016-05-25 (×3): qty 0.05

## 2016-05-25 MED ORDER — INSULIN ASPART 100 UNIT/ML ~~LOC~~ SOLN
3.0000 [IU] | Freq: Three times a day (TID) | SUBCUTANEOUS | Status: DC
  Administered 2016-05-25 – 2016-05-30 (×15): 3 [IU] via SUBCUTANEOUS

## 2016-05-25 MED ORDER — POLYETHYLENE GLYCOL 3350 17 G PO PACK
17.0000 g | PACK | Freq: Two times a day (BID) | ORAL | Status: DC
  Administered 2016-05-25 – 2016-05-27 (×4): 17 g via ORAL
  Filled 2016-05-25 (×9): qty 1

## 2016-05-25 MED ORDER — INSULIN ASPART 100 UNIT/ML ~~LOC~~ SOLN
0.0000 [IU] | Freq: Every day | SUBCUTANEOUS | Status: DC
  Administered 2016-05-25: 5 [IU] via SUBCUTANEOUS
  Administered 2016-05-26: 4 [IU] via SUBCUTANEOUS
  Administered 2016-05-27: 5 [IU] via SUBCUTANEOUS

## 2016-05-25 MED ORDER — INSULIN ASPART 100 UNIT/ML ~~LOC~~ SOLN
0.0000 [IU] | Freq: Three times a day (TID) | SUBCUTANEOUS | Status: DC
  Administered 2016-05-25 (×2): 15 [IU] via SUBCUTANEOUS
  Administered 2016-05-26: 8 [IU] via SUBCUTANEOUS
  Administered 2016-05-26: 5 [IU] via SUBCUTANEOUS
  Administered 2016-05-27: 15 [IU] via SUBCUTANEOUS
  Administered 2016-05-27: 11 [IU] via SUBCUTANEOUS
  Administered 2016-05-27: 3 [IU] via SUBCUTANEOUS
  Administered 2016-05-28: 5 [IU] via SUBCUTANEOUS
  Administered 2016-05-28: 18:00:00 8 [IU] via SUBCUTANEOUS
  Administered 2016-05-28: 11 [IU] via SUBCUTANEOUS
  Administered 2016-05-29: 07:00:00 2 [IU] via SUBCUTANEOUS
  Administered 2016-05-29: 8 [IU] via SUBCUTANEOUS
  Administered 2016-05-29: 17:00:00 5 [IU] via SUBCUTANEOUS
  Administered 2016-05-30 (×2): 2 [IU] via SUBCUTANEOUS

## 2016-05-25 MED ORDER — POTASSIUM CHLORIDE CRYS ER 20 MEQ PO TBCR
40.0000 meq | EXTENDED_RELEASE_TABLET | Freq: Two times a day (BID) | ORAL | Status: AC
  Administered 2016-05-25 – 2016-05-26 (×2): 40 meq via ORAL
  Filled 2016-05-25 (×2): qty 2

## 2016-05-25 MED ORDER — POTASSIUM CHLORIDE CRYS ER 20 MEQ PO TBCR
40.0000 meq | EXTENDED_RELEASE_TABLET | Freq: Once | ORAL | Status: AC
  Administered 2016-05-25: 40 meq via ORAL
  Filled 2016-05-25: qty 2

## 2016-05-25 NOTE — Progress Notes (Signed)
[facility] PROGRESS NOTE    Progress Note  [name]  [id number] DOB: 04-28-1927 DOA: 05/17/2016 PCP: Laurey Morale, MD     Brief Narrative:   Andrew Norton is an 82 y.o. male past medical history of essential hypertension, non-insulin-dependent diabetes mellitus, neuropathy with gait instability they came into the hospital with new onset jaundice with an abdominal CT scan showed intrahepatic and biliary ductal dilation.  Assessment/Plan:   Ampullary lesion due to 1.2 cm mm Bile duct obstruction ERCP was done that showed biliary stone status post sphincterotomy and balloon distraction. Distal bile duct dilatation biopsies were done. Stent is not placed. Biopsy results Was negative for malignancy. LFTs are slowly trending down we will continue IV abx due to concern of possible cholangitis.  New-onset atrial fibrillation/Tachy-brady syndrome [facility]): No known low-dose diltiazem drip with heart rate control. I appreciate EP's assistance, the patient and family decided to start flecainide, no events on telemetry.  Leukocytosis: Afebrile, IV Cipro and flagyl.   HYPERKALEMIA Resolved, , magnesium 1.8.  Diabetes mellitus type 2, noninsulin dependent (HCC) A1c of 7.6 continue hold oral hypoglycemic agents, continue sliding scale insulin.  Protein-calorie malnutrition, severe  NSVT (nonsustained ventricular tachycardia) (HCC)  Essential hypertension: Continue hold Cozaar and diltiazem, blood pressure seems to be stable  Chronic constipation: Continue stool softener.  DVT prophylaxis: heparin Family Communication: Wife Disposition Plan/Barrier to D/C: 2 days. Code Status:     Code Status Orders        Start     Ordered   05/17/16 1829  Full code  Continuous     05/17/16 1828    Code Status History    Date Active Date Inactive Code Status Order ID Comments User Context   04/13/2015 12:44 AM 04/16/2015  3:42 PM Full Code [id number]  Allyne Gee, MD Inpatient   11/05/2012  7:13 PM 11/08/2012  4:53 PM Full Code [id number]  [name], MD Inpatient    Advance Directive Documentation   Flowsheet Row Most Recent Value  Type of Advance Directive  Healthcare Power of Attorney, Living will  Pre-existing out of facility DNR order (yellow form or pink MOST form)  No data  "MOST" Form in Place?  No data        IV Access:    Peripheral IV   Procedures and diagnostic studies:   US Abdomen Complete  Result Date: 05/23/2016 CLINICAL DATA:  Recent common bile duct calculus EXAM: ABDOMEN ULTRASOUND COMPLETE COMPARISON:  ERCP May 22, 2016. FINDINGS: Gallbladder: No gallstones are evident. The gallbladder wall is thickened and edematous. There is no pericholecystic fluid. No sonographic Murphy sign noted by sonographer. Common bile duct: Diameter: 13 mm, dilated. There is moderate edema in the common hepatic and common bile duct. No mass or calculus is seen by ultrasound currently in the biliary ductal system. A small amount of air in the intrahepatic ducts may be due to the recent common bile duct manipulation. The intrahepatic biliary ducts do not appear appreciably dilated by ultrasound. Liver: No focal lesion identified. Liver echogenicity is overall increased. IVC: No abnormality visualized. Pancreas: Visualized portion unremarkable. Portions of the pancreatic tail are obscured by gas. Spleen: Size and appearance within normal limits. Right Kidney: Length: 12.6 cm. Echogenicity within normal limits. No mass or hydronephrosis visualized. There is renal cortical thinning. Left Kidney: Length: 11.6 cm. Echogenicity within normal limits. No mass or hydronephrosis visualized. There is renal cortical thinning. Abdominal aorta: No aneurysm visualized. Other findings: No demonstrable ascites.  IMPRESSION: Gallbladder wall is thickened and edematous. No gallstones seen. A degree of acalculus cholecystitis must be of concern. Common bile duct remains dilated without  mass or calculus evident by ultrasound. There is edema in the wall of the common bile duct. A small amount of air in the intrahepatic biliary ductal system is likely secondary to the recent biliary ductal manipulation. Portions of pancreas obscured by gas. Visualized portions of pancreas appear normal. Renal cortical thinning bilaterally may be a function of age or may be seen with medical renal disease. Kidneys otherwise appear unremarkable. Study otherwise unremarkable. Electronically Signed   By: Lowella Grip III M.D.   On: 05/23/2016 15:19     Medical Consultants:    None.  Anti-Infectives:   None  Subjective:    [name] he relates no discomfort this morning.   Objective:    Vitals:   05/24/16 0620 05/24/16 1352 05/24/16 2042 05/25/16 0534  BP: (!) 102/51 (!) 128/53 (!) 123/55 129/65  Pulse: 72 72 70 68  Resp: 18 18 15 16   Temp: 99 F (37.2 C) 97.2 F (36.2 C) 98.5 F (36.9 C) 97.4 F (36.3 C)  TempSrc: Oral Oral Oral Oral  SpO2: 100%  100% 98%  Weight:      Height:        Intake/Output Summary (Last 24 hours) at 05/25/16 1213 Last data filed at 05/25/16 0925  Gross per 24 hour  Intake           907.92 ml  Output              725 ml  Net           182.92 ml   Filed Weights   05/17/16 1743 05/22/16 1158  Weight: 69.9 kg (154 lb 1.6 oz) 69.9 kg (154 lb)    Exam: General exam: In no acute distress. Respiratory system: Good air movement and clear to auscultation. Cardiovascular system: S1 & S2 heard, RRR. Gastrointestinal system: Abdomen is nondistended, soft and nontender.  Central nervous system: Alert and oriented. No focal neurological deficits. Extremities: No pedal edema. Skin: No rashes, lesions or ulcers Psychiatry: Judgement and insight appear normal. Mood & affect appropriate.    Data Reviewed:    Labs: Basic Metabolic Panel:  Recent Labs Lab 05/19/16 0401 05/21/16 0340 05/22/16 0511 05/25/16 0330  NA 133* 135 133* 131*  K  3.8 3.9 3.7 3.3*  CL 103 101 99* 98*  CO2 23 24 23 24   GLUCOSE 164* 202* 217* 216*  BUN 11 16 20  29*  CREATININE 0.64 0.60* 0.72 0.76  CALCIUM 8.0* 8.8* 8.5* 7.8*  MG  --  1.4* 1.8  --    GFR Estimated Creatinine Clearance: 64.3 mL/min (by C-G formula based on SCr of 0.76 mg/dL). Liver Function Tests:  Recent Labs Lab 05/21/16 0340 05/23/16 0401 05/24/16 0521 05/25/16 0330  AST 88* 209* 232* 118*  ALT 105* 132* 153* 125*  ALKPHOS 454* 509* 626* 598*  BILITOT 6.1* 8.7* 6.5* 4.2*  PROT 6.5 5.7* 5.7* 5.9*  ALBUMIN 3.1* 2.7* 2.6* 2.6*   No results for input(s): LIPASE, AMYLASE in the last 168 hours. No results for input(s): AMMONIA in the last 168 hours. Coagulation profile No results for input(s): INR, PROTIME in the last 168 hours.  CBC:  Recent Labs Lab 05/21/16 0340 05/22/16 0511 05/23/16 0401 05/24/16 0521 05/25/16 0330  WBC 8.6 9.5 8.4 9.2 9.7  HGB 9.1* 9.0* 8.2* 9.0* 8.1*  HCT 26.3* 25.6* 23.1*  25.4* 22.8*  MCV 90.7 89.8 88.8 89.1 88.7  PLT 342 291 255 289 261   Cardiac Enzymes: No results for input(s): CKTOTAL, CKMB, CKMBINDEX, TROPONINI in the last 168 hours. BNP (last 3 results) No results for input(s): PROBNP in the last 8760 hours. CBG:  Recent Labs Lab 05/24/16 1716 05/24/16 2135 05/24/16 2324 05/25/16 0742 05/25/16 1144  GLUCAP 331* 385* 336* 215* 442*   D-Dimer: No results for input(s): DDIMER in the last 72 hours. Hgb A1c: No results for input(s): HGBA1C in the last 72 hours. Lipid Profile: No results for input(s): CHOL, HDL, LDLCALC, TRIG, CHOLHDL, LDLDIRECT in the last 72 hours. Thyroid function studies: No results for input(s): TSH, T4TOTAL, T3FREE, THYROIDAB in the last 72 hours.  Invalid input(s): FREET3 Anemia work up: No results for input(s): VITAMINB12, FOLATE, FERRITIN, TIBC, IRON, RETICCTPCT in the last 72 hours. Sepsis Labs:  Recent Labs Lab 05/19/16 0401 05/20/16 0527 05/21/16 0340 05/22/16 0511 05/23/16 0401  05/24/16 0521 05/25/16 0330  PROCALCITON 2.48 1.13 0.73 0.79  --   --   --   WBC 8.3 6.8 8.6 9.5 8.4 9.2 9.7   Microbiology Recent Results (from the past 240 hour(s))  Culture, blood (routine x 2)     Status: None   Collection Time: 05/17/16  6:33 PM  Result Value Ref Range Status   Specimen Description BLOOD RIGHT ARM  Final   Special Requests BOTTLES DRAWN AEROBIC AND ANAEROBIC 5CC  Final   Culture   Final    NO GROWTH 5 DAYS Performed at [facility]    Report Status 05/22/2016 FINAL  Final  Culture, blood (routine x 2)     Status: None   Collection Time: 05/17/16  6:33 PM  Result Value Ref Range Status   Specimen Description BLOOD LEFT ARM  Final   Special Requests IN PEDIATRIC BOTTLE 4CC  Final   Culture   Final    NO GROWTH 5 DAYS Performed at [facility]    Report Status 05/22/2016 FINAL  Final  Urine culture     Status: Abnormal   Collection Time: 05/17/16 10:09 PM  Result Value Ref Range Status   Specimen Description URINE, CLEAN CATCH  Final   Special Requests NONE  Final   Culture MULTIPLE SPECIES PRESENT, SUGGEST RECOLLECTION (A)  Final   Report Status 05/19/2016 FINAL  Final     Medications:   . ciprofloxacin  400 mg Intravenous BID  . diltiazem  120 mg Oral Daily  . feeding supplement (ENSURE ENLIVE)  237 mL Oral BID BM  . finasteride  5 mg Oral Daily  . flecainide  50 mg Oral Q12H  . gabapentin  400 mg Oral QID  . insulin aspart  0-15 Units Subcutaneous TID WC  . insulin aspart  0-5 Units Subcutaneous QHS  . insulin aspart  3 Units Subcutaneous TID WC  . insulin glargine  5 Units Subcutaneous BID  . lidocaine  1 patch Transdermal Q24H  . magnesium oxide  400 mg Oral Daily  . metroNIDAZOLE  500 mg Oral Q8H  . polyethylene glycol  17 g Oral BID  . potassium chloride  40 mEq Oral BID  . rivaroxaban  20 mg Oral Q supper  . senna-docusate  1 tablet Oral BID  . sodium chloride flush  3 mL Intravenous Q12H   Continuous  Infusions:   Time spent: 15 min   LOS: 8 days   Charlynne Cousins  [facility] Pager [phone]  *  Please refer to [email or web address], password [email or web address] to get updated schedule on who will round on this patient, as hospitalists switch teams weekly. If 7PM-7AM, please contact night-coverage at [email or web address], password [email or web address] for any overnight needs.  05/25/2016, 12:13 PM

## 2016-05-25 NOTE — Progress Notes (Signed)
    Progress Note   Subjective  Chief Complaint: Jaundice. Biliary Obstruction, Duodenal Abnormality  This morning patient is found comfortably sitting up in the chair by his bedside. He denies any abdominal pain, nausea or vomiting. He does tell me today that it has been 4 days since his last bowel movement. Apparently this is chronic for him and he uses twice a day MiraLAX dosing at home to control this.    Objective   Vital signs in last 24 hours: Temp:  [97.2 F (36.2 C)-98.5 F (36.9 C)] 97.4 F (36.3 C) (11/10 0534) Pulse Rate:  [68-72] 68 (11/10 0534) Resp:  [15-18] 16 (11/10 0534) BP: (123-129)/(53-65) 129/65 (11/10 0534) SpO2:  [98 %-100 %] 98 % (11/10 0534) Last BM Date: 05/22/16 General: Caucasian male in NAD Heart:  Regular rate and rhythm; no murmurs Lungs: Respirations even and unlabored, lungs CTA bilaterally Abdomen:  Soft, nontender and nondistended. Normal bowel sounds. Extremities:  Without edema. Neurologic:  Alert and oriented,  grossly normal neurologically. Psych:  Cooperative. Normal mood and affect.   Lab Results:  Recent Labs  05/23/16 0401 05/24/16 0521 05/25/16 0330  WBC 8.4 9.2 9.7  HGB 8.2* 9.0* 8.1*  HCT 23.1* 25.4* 22.8*  PLT 255 289 261   BMET  Recent Labs  05/25/16 0330  NA 131*  K 3.3*  CL 98*  CO2 24  GLUCOSE 216*  BUN 29*  CREATININE 0.76  CALCIUM 7.8*   LFT  Recent Labs  05/24/16 0521 05/25/16 0330  PROT 5.7* 5.9*  ALBUMIN 2.6* 2.6*  AST 232* 118*  ALT 153* 125*  ALKPHOS 626* 598*  BILITOT 6.5* 4.2*  BILIDIR 4.1*  --   IBILI 2.4*  --      Assessment / Plan:   Assessment: 1. Painless Jaundice: biliary obstruction-pt s/p ERCP 05/22/16-large stone removed, LFT's now trending down 2. Duodenal Mass: the "mass" described on CT was almost certainly caused by the large stone causing bulging of the major papilla  Plan: 1. Continue to observe and monitor LFT's, reordered for tomorrow morning 2. Continue  supportive measures 3. Continue IV Cipro for another 24 hours and then return to oral 500mg  BID, continue Flagyl 500mg  TID 4. We will likely sign off today, please await any final recommendations from Dr. Ardis Hughs  Thank you for your kind consultation, please let us know if we may be of any further assistance.   LOS: 8 days   Levin Erp  05/25/2016, 10:24 AM  Pager # [phone]  ________________________________________________________________________  Velora Heckler GI MD note:  I reviewed the data and agree with the assessment and plan described above.  He will be OK to discharge tomorrow  from my perspective (after another 24 hours of IV cipro). He will need 5 day prescription for oral cipro and flagyl after that.  My office will contact him about repeat LFTs in 5-7 days and I will contact him with those results. No need for GI office visit for now.  Please call or page with any further questions or concerns.    Owens Loffler, MD [facility] Pager [phone]

## 2016-05-25 NOTE — Care Management Important Message (Signed)
Important Message  Patient Details  Name: Andrew Norton MRN: [id number] Date of Birth: 13-Feb-1927   Medicare Important Message Given:  Yes    Camillo Flaming 05/25/2016, 12:12 [place] Message  Patient Details  Name: Andrew Norton MRN: [id number] Date of Birth: 17-Apr-1927   Medicare Important Message Given:  Yes    Camillo Flaming 05/25/2016, 12:11 PM

## 2016-05-25 NOTE — Discharge Instructions (Addendum)
Supplemental Discharge Instructions for  Pacemaker/Defibrillator Patients  Activity No heavy lifting or vigorous activity with your left/right arm for 6 to 8 weeks.  Do not raise your left/right arm above your head for one week.  Gradually raise your affected arm as drawn below.           __        06/02/16                     06/03/16                06/04/16               06/05/16  NO DRIVING for   1 week  ; you may begin driving on  [id number]   .  WOUND CARE - Keep the wound area clean and dry.  Do not get this area wet for one week. No showers for one week; you may shower on 06/05/16    . - The tape/steri-strips on your wound will fall off; do not pull them off.  No bandage is needed on the site.  DO  NOT apply any creams, oils, or ointments to the wound area. - If you notice any drainage or discharge from the wound, any swelling or bruising at the site, or you develop a fever > 101? F after you are discharged home, call the office at once.  Special Instructions - You are still able to use cellular telephones; use the ear opposite the side where you have your pacemaker/defibrillator.  Avoid carrying your cellular phone near your device. - When traveling through airports, show security personnel your identification card to avoid being screened in the metal detectors.  Ask the security personnel to use the hand wand. - Avoid arc welding equipment, MRI testing (magnetic resonance imaging), TENS units (transcutaneous nerve stimulators).  Call the office for questions about other devices. - Avoid electrical appliances that are in poor condition or are not properly grounded. - Microwave ovens are safe to be near or to operate.   Information on my medicine - XARELTO (Rivaroxaban)  This medication education was reviewed with me or my healthcare representative as part of my discharge preparation.  The pharmacist that spoke with me during my hospital stay was:  Sallyanne Havers,  Student-PharmD  Why was Xarelto prescribed for you? Xarelto was prescribed for you to reduce the risk of a blood clot forming that can cause a stroke if you have a medical condition called atrial fibrillation (a type of irregular heartbeat).  What do you need to know about xarelto ? Take your Xarelto ONCE DAILY at the same time every day with your evening meal. If you have difficulty swallowing the tablet whole, you may crush it and mix in applesauce just prior to taking your dose.  Take Xarelto exactly as prescribed by your doctor and DO NOT stop taking Xarelto without talking to the doctor who prescribed the medication.  Stopping without other stroke prevention medication to take the place of Xarelto may increase your risk of developing a clot that causes a stroke.  Refill your prescription before you run out.  After discharge, you should have regular check-up appointments with your healthcare provider that is prescribing your Xarelto.  In the future your dose may need to be changed if your kidney function or weight changes by a significant amount.  What do you do if you miss a dose? If you are taking Xarelto ONCE  DAILY and you miss a dose, take it as soon as you remember on the same day then continue your regularly scheduled once daily regimen the next day. Do not take two doses of Xarelto at the same time or on the same day.   Important Safety Information A possible side effect of Xarelto is bleeding. You should call your healthcare provider right away if you experience any of the following: ? Bleeding from an injury or your nose that does not stop. ? Unusual colored urine (red or dark brown) or unusual colored stools (red or black). ? Unusual bruising for unknown reasons. ? A serious fall or if you hit your head (even if there is no bleeding).  Some medicines may interact with Xarelto and might increase your risk of bleeding while on Xarelto. To help avoid this, consult your  healthcare provider or pharmacist prior to using any new prescription or non-prescription medications, including herbals, vitamins, non-steroidal anti-inflammatory drugs (NSAIDs) and supplements.  This website has more information on Xarelto: [email or web address]

## 2016-05-25 NOTE — Progress Notes (Signed)
Physical Therapy Treatment Patient Details Name: Andrew Norton MRN: [id number] DOB: [birth year] Today's Date: 06/09/2016    History of Present Illness 82 y.o. male past medical history of essential hypertension, non-insulin-dependent diabetes mellitus, neuropathy with gait instability and presented to hospital with new onset jaundice with an abdominal CT scan showed intrahepatic and biliary ductal dilation, s/p ERCP 05/22/16    PT Comments    Pt assisted with ambulating in hallway and used SPC today instead of RW.  Pt's steadiness improved with distance.  Encouraged pt to continue ambulating with his son or staff during acute stay.  Follow Up Recommendations  Home health PT     Equipment Recommendations  None recommended by PT    Recommendations for Other Services       Precautions / Restrictions Precautions Precautions: Fall Restrictions Weight Bearing Restrictions: No     Mobility  Bed Mobility               General bed mobility comments: pt up in recliner on arrival  Transfers Overall transfer level: Needs assistance Equipment used: Straight cane Transfers: Sit to/from Stand Sit to Stand: Min guard         General transfer comment: min/guard for safety  Ambulation/Gait Ambulation/Gait assistance: Min guard Ambulation Distance (Feet): 240 Feet Assistive device: Straight cane Gait Pattern/deviations: Step-through pattern;Trunk flexed     General Gait Details: verbal cues for posture, slightly unsteady initially however improved with distance   Stairs            Wheelchair Mobility    Modified Rankin (Stroke Patients Only)       Balance                                    Cognition Arousal/Alertness: Awake/alert Behavior During Therapy: WFL for tasks assessed/performed Overall Cognitive Status: Within Functional Limits for tasks assessed                      Exercises      General Comments        Pertinent  Vitals/Pain Pain Assessment: 0-10 Pain Score: 2  Pain Location: abdomen Pain Descriptors / Indicators: Sore Pain Intervention(s): Limited activity within patient's tolerance;Monitored during session  HR 66 bpm pre session and 80 bpm post session    Home Living                      Prior Function            PT Goals (current goals can now be found in the care plan section) Progress towards PT goals: Progressing toward goals    Frequency    Min 3X/week      PT Plan Current plan remains appropriate    Co-evaluation             End of Session   Activity Tolerance: Patient tolerated treatment well Patient left: in chair;with call bell/phone within reach;with chair alarm set     Time: [id number] PT Time Calculation (min) (ACUTE ONLY): 16 min  Charges:  $Gait Training: 8-22 mins                    G Codes:      Madysun Thall,KATHrine E 2016/06/09, 1:07 PM Carmelia Bake, PT, DPT 06/09/16 Pager: [phone]

## 2016-05-25 NOTE — Progress Notes (Addendum)
Patient Name: Andrew Norton Date of Encounter: 05/25/2016  Primary Cardiologist: Dr. Sande Rives Problem List     Principal Problem:   Bile duct obstruction Active Problems:   HYPERKALEMIA   Hyperlipidemia   Constipation   Jaundice   Diabetes mellitus type 2, noninsulin dependent (HCC)   LFT elevation   Leukocytosis   Ampulla of Vater mass   Paroxysmal atrial fibrillation (HCC)   Protein-calorie malnutrition, severe   Abnormal CT of the abdomen   Tachy-brady syndrome (HCC)   Sinus pause   NSVT (nonsustained ventricular tachycardia) (HCC)    Subjective   Denies any chest discomfort or palpitations. Breathing at baseline. Does report feeling constipated, as his last BM was four days ago.   Inpatient Medications    Scheduled Meds: . ciprofloxacin  400 mg Intravenous BID  . diltiazem  120 mg Oral Daily  . feeding supplement (ENSURE ENLIVE)  237 mL Oral BID BM  . finasteride  5 mg Oral Daily  . flecainide  50 mg Oral Q12H  . gabapentin  400 mg Oral QID  . insulin aspart  0-15 Units Subcutaneous TID WC  . insulin glargine  5 Units Subcutaneous QHS  . lidocaine  1 patch Transdermal Q24H  . magnesium oxide  400 mg Oral Daily  . metroNIDAZOLE  500 mg Oral Q8H  . rivaroxaban  20 mg Oral Q supper  . senna-docusate  1 tablet Oral BID  . sodium chloride flush  3 mL Intravenous Q12H   Continuous Infusions:  PRN Meds: cyclobenzaprine, hydrALAZINE, HYDROcodone-acetaminophen   Vital Signs    Vitals:   05/24/16 0620 05/24/16 1352 05/24/16 2042 05/25/16 0534  BP: (!) 102/51 (!) 128/53 (!) 123/55 129/65  Pulse: 72 72 70 68  Resp: 18 18 15 16   Temp: 99 F (37.2 C) 97.2 F (36.2 C) 98.5 F (36.9 C) 97.4 F (36.3 C)  TempSrc: Oral Oral Oral Oral  SpO2: 100%  100% 98%  Weight:      Height:        Intake/Output Summary (Last 24 hours) at 05/25/16 0737 Last data filed at 05/25/16 0536  Gross per 24 hour  Intake           607.92 ml  Output              725 ml    Net          -117.08 ml   Filed Weights   05/17/16 1743 05/22/16 1158  Weight: 154 lb 1.6 oz (69.9 kg) 154 lb (69.9 kg)    Physical Exam   GEN: Well nourished, well developed, elderly Caucasian male appearing in no acute distress.  HEENT: Grossly normal.  Neck: Supple, no JVD, carotid bruits, or masses. Cardiac: RRR, no murmurs, rubs, or gallops. No clubbing, cyanosis, edema.  Radials/DP/PT 2+ and equal bilaterally.  Respiratory:  Respirations regular and unlabored, clear to auscultation bilaterally. GI: Soft, nontender, nondistended, BS + x 4. MS: no deformity or atrophy. Skin: warm and dry, no rash. Neuro:  Strength and sensation are intact. Psych: AAOx3.  Normal affect.  Labs    CBC  Recent Labs  05/24/16 0521 05/25/16 0330  WBC 9.2 9.7  HGB 9.0* 8.1*  HCT 25.4* 22.8*  MCV 89.1 88.7  PLT 289 0000000   Basic Metabolic Panel  Recent Labs  05/25/16 0330  NA 131*  K 3.3*  CL 98*  CO2 24  GLUCOSE 216*  BUN 29*  CREATININE 0.76  CALCIUM 7.8*  Liver Function Tests  Recent Labs  05/24/16 0521 05/25/16 0330  AST 232* 118*  ALT 153* 125*  ALKPHOS 626* 598*  BILITOT 6.5* 4.2*  PROT 5.7* 5.9*  ALBUMIN 2.6* 2.6*     Telemetry    Episodes of PAF this AM with HR peaking into 130's. 3.8 second post-termination pause followed by NSR with HR in the 70's - 80's.  - Personally Reviewed  ECG    No new tracings.   Radiology    Dg Ercp Biliary & Pancreatic Ducts  Result Date: 05/22/2016 CLINICAL DATA:  [place] EXAM: ERCP TECHNIQUE: Multiple spot images obtained with the fluoroscopic device and submitted for interpretation post-procedure. FLUOROSCOPY TIME:  Fluoroscopy Time:  5 minutes and 12 seconds Radiation Exposure Index (if provided by the fluoroscopic device): 87 mGy Number of Acquired Spot Images: 6 COMPARISON:  None. FINDINGS: The common bile duct is markedly dilated. There is a filling defect in the distal common bile duct. Contrast also fills the  pancreatic duct. IMPRESSION: See above. These images were submitted for radiologic interpretation only. Please see the procedural report for the amount of contrast and the fluoroscopy time utilized. Electronically Signed   By: Marybelle Killings M.D.   On: 05/22/2016 16:03    Cardiac Studies   Echocardiogram: 05/19/2016 Study Conclusions  - Left ventricle: The cavity size was normal. Wall thickness was   increased in a pattern of mild LVH. Systolic function was normal.   The estimated ejection fraction was in the range of 55% to 60%.   Wall motion was normal; there were no regional wall motion   abnormalities. Doppler parameters are consistent with abnormal   left ventricular relaxation (grade 1 diastolic dysfunction). - Aortic valve: There was trivial regurgitation. - Mitral valve: Calcified annulus. - Pulmonary arteries: Systolic pressure was mildly increased. PA   peak pressure: 52 mm Hg (S).  Impressions:  - Normal LV systolic function; grade 1 diastolic dysfunction; trace   AI; mild TR with mildly elevated pulmonary pressure.  Patient Profile     82 yo M w/ PMH of  DM, HTN, cervical myelopathy with gait instability admitted with new onset jaundice noted to develop atrial fibrillation with rapid ventricular response which was asymptomatic (paroxysms of AF/NSR on tele this adm). Tele remarkable for occasional nonsustained bradycardia with intermittent post-conversion pauses (up to 5 sec on 05/20/16), AF RVR, and brief NSVT.  Assessment & Plan    1. 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 Pathology negative for malignancy. - GI following elevated LFT's. No contraindications for possible PPM placement.  2. Paroxysmal atrial fib with RVR and Tachy-brady syndrome  - telemetry notable for  post-conversion pauses (up to 5 seconds on 11/5) with junctional bradycardia interspersed with AF RVR 140s. He is asymptomatic throughout. - BB and Diltiazem were initially held with recurrent pauses. Restarted on IV Cardizem with recurrent episodes of tachycardia. This has now been switched to Cardizem CD 120mg  daily.  - Amiodarone is being avoided given his abnormal LFTs and jaundice. - This patients CHA2DS2-VASc Score and unadjusted Ischemic Stroke Rate (% per year) is equal to 4.8 % stroke rate/year from a score of 4 (HTN, DM, Age (2)). Has been started on Xarelto 20mg  daily.  - evaluated by EP who discussed antiarrhythmic therapy vs. PPM placement with the patient  and his family. He elects to try conservative therapy first with an AAD. Has been started on Flecainide 50mg  BID. Will take him off the cath lab schedule.   3. NSVT  - quiescent with repletion of magnesium. Started on MagOx 400mg  daily.  4. Chronic Anemia - Hgb 8.1 this AM. Continue to follow closely while on anticoagulation. Denies any evidence of active bleeding.  5. HTN  - well-controlled at 123/53 - 129/65 in the past 24 hours.   6. Hypokalemia - K+ 3.3 this AM.  - will replace.   Signed, Erma Heritage, PA  05/25/2016, 7:37 AM   The patient has been seen in conjunction with [place], PA-C. All aspects of care have been considered and discussed. The patient has been personally interviewed, examined, and all clinical data has been reviewed.   This morning there was more SVT. Postconversion pauses greater than 3 seconds noted. The patient has been asymptomatic. The amount of SVT has been much reduced since flecainide was started.  Continue observation for another 24 hours.  Will need follow-up with EP ([facility])/primary cardiologist Oval Linsey) at discharge.

## 2016-05-25 NOTE — Progress Notes (Signed)
MD on unit, made aware of CBG 442, stated it was not necessary to do lab draw.

## 2016-05-26 LAB — COMPREHENSIVE METABOLIC PANEL
ALBUMIN: 3 g/dL — AB (ref 3.5–5.0)
ALT: 121 U/L — ABNORMAL HIGH (ref 17–63)
AST: 112 U/L — AB (ref 15–41)
Alkaline Phosphatase: 668 U/L — ABNORMAL HIGH (ref 38–126)
Anion gap: 8 (ref 5–15)
BILIRUBIN TOTAL: 4.1 mg/dL — AB (ref 0.3–1.2)
BUN: 29 mg/dL — AB (ref 6–20)
CO2: 27 mmol/L (ref 22–32)
Calcium: 8.7 mg/dL — ABNORMAL LOW (ref 8.9–10.3)
Chloride: 99 mmol/L — ABNORMAL LOW (ref 101–111)
Creatinine, Ser: 0.8 mg/dL (ref 0.61–1.24)
GFR calc Af Amer: >60 mL/min (ref >60)
GFR calc non Af Amer: >60 mL/min (ref >60)
GLUCOSE: 268 mg/dL — AB (ref 65–99)
POTASSIUM: 4.5 mmol/L (ref 3.5–5.1)
Sodium: 134 mmol/L — ABNORMAL LOW (ref 135–145)
TOTAL PROTEIN: 6.4 g/dL — AB (ref 6.5–8.1)

## 2016-05-26 LAB — CBC
HEMATOCRIT: 24.5 % — AB (ref 39.0–52.0)
HEMOGLOBIN: 8.6 g/dL — AB (ref 13.0–17.0)
MCH: 31.6 pg (ref 26.0–34.0)
MCHC: 35.1 g/dL (ref 30.0–36.0)
MCV: 90.1 fL (ref 78.0–100.0)
Platelets: 274 10*3/uL (ref 150–400)
RBC: 2.72 MIL/uL — ABNORMAL LOW (ref 4.22–5.81)
RDW: 15.7 % — AB (ref 11.5–15.5)
WBC: 9.9 10*3/uL (ref 4.0–10.5)

## 2016-05-26 LAB — GLUCOSE, CAPILLARY
GLUCOSE-CAPILLARY: 286 mg/dL — AB (ref 65–99)
GLUCOSE-CAPILLARY: 334 mg/dL — AB (ref 65–99)
Glucose-Capillary: 214 mg/dL — ABNORMAL HIGH (ref 65–99)
Glucose-Capillary: 424 mg/dL — ABNORMAL HIGH (ref 65–99)

## 2016-05-26 LAB — MAGNESIUM: Magnesium: 2 mg/dL (ref 1.7–2.4)

## 2016-05-26 MED ORDER — INSULIN GLARGINE 100 UNIT/ML ~~LOC~~ SOLN
15.0000 [IU] | Freq: Two times a day (BID) | SUBCUTANEOUS | Status: DC
  Administered 2016-05-26: 15 [IU] via SUBCUTANEOUS
  Filled 2016-05-26 (×2): qty 0.15

## 2016-05-26 MED ORDER — GLUCERNA PO LIQD: 237.0000 mL | Freq: Two times a day (BID) | ORAL | Status: DC

## 2016-05-26 MED ORDER — INSULIN ASPART 100 UNIT/ML ~~LOC~~ SOLN
17.0000 [IU] | Freq: Once | SUBCUTANEOUS | Status: AC
  Administered 2016-05-26: 17 [IU] via SUBCUTANEOUS

## 2016-05-26 MED ORDER — GLUCERNA SHAKE PO LIQD
237.0000 mL | Freq: Two times a day (BID) | ORAL | Status: DC
  Administered 2016-05-29 – 2016-05-30 (×3): 237 mL via ORAL
  Filled 2016-05-26 (×11): qty 237

## 2016-05-26 MED ORDER — INSULIN GLARGINE 100 UNIT/ML ~~LOC~~ SOLN
10.0000 [IU] | Freq: Two times a day (BID) | SUBCUTANEOUS | Status: DC
  Administered 2016-05-26: 10 [IU] via SUBCUTANEOUS
  Filled 2016-05-26: qty 0.1

## 2016-05-26 NOTE — Progress Notes (Signed)
[facility] PROGRESS NOTE    Progress Note  Andrew Norton  [id number] DOB: [birth year] DOA: 05/17/2016 PCP: Laurey Morale, MD     Brief Narrative:   Andrew Norton is an 82 y.o. male past medical history of essential hypertension, non-insulin-dependent diabetes mellitus, neuropathy with gait instability they came into the hospital with new onset jaundice with an abdominal CT scan showed intrahepatic and biliary ductal dilation.  Assessment/Plan:   Ampullary lesion due to 1.2 cm mm Bile duct obstruction ERCP was done that showed biliary stone status post sphincterotomy and balloon distraction. Distal bile duct dilatation biopsies were done. Stent is not placed. Biopsy results Was negative for malignancy. LFTs are trending down, will continue IV antibiotics for possible cholangitis while in-house.  New-onset atrial fibrillation/Tachy-brady syndrome ([place]): Continues to have multiple pauses as long as 5 second loss. I appreciate EP's assistance, the patient and family decided to start flecainide, no events on telemetry. Appreciate cardiology's assistance. Continue monitor and replete electrolytes.  Leukocytosis: Afebrile, now resolved. Cont IV abx.   HYPERKALEMIA Resolved, , magnesium 1.8.  Diabetes mellitus type 2, noninsulin dependent (HCC) A1c of 7.6 glucose running high increase long-acting to increase the sliding scale. Continue hold oral hypoglycemic agents.  Protein-calorie malnutrition, severe  NSVT (nonsustained ventricular tachycardia) (HCC)  Essential hypertension: Continue hold Cozaar and diltiazem, blood pressure seems to be stable  Chronic constipation: Continue stool softener.  DVT prophylaxis: heparin Family Communication: Wife Disposition Plan/Barrier to D/C: 2 days. Code Status:     Code Status Orders        Start     Ordered   05/17/16 1829  Full code  Continuous     05/17/16 1828    Code Status History    Date Active Date Inactive Code  Status Order ID Comments User Context   04/13/2015 12:44 AM 04/16/2015  3:42 PM Full Code [id number]  Allyne Gee, MD Inpatient   11/05/2012  7:13 PM 11/08/2012  4:53 PM Full Code [id number]  [name], MD Inpatient    Advance Directive Documentation   Flowsheet Row Most Recent Value  Type of Advance Directive  Healthcare Power of Attorney, Living will  Pre-existing out of facility DNR order (yellow form or pink MOST form)  No data  "MOST" Form in Place?  No data        IV Access:    Peripheral IV   Procedures and diagnostic studies:   No results found.   Medical Consultants:    None.  Anti-Infectives:   None  Subjective:    Andrew Norton he relates no discomfort this morning.   Objective:    Vitals:   05/25/16 0534 05/25/16 1419 05/25/16 2000 05/26/16 0625  BP: 129/65 (!) 128/46 131/89 (!) 159/62  Pulse: 68 66 (!) 116 68  Resp: 16 16 16 16   Temp: 97.4 F (36.3 C) 97.5 F (36.4 C) 97.7 F (36.5 C) 98 F (36.7 C)  TempSrc: Oral Oral Oral Oral  SpO2: 98% 100% 100% 100%  Weight:      Height:        Intake/Output Summary (Last 24 hours) at 05/26/16 0938 Last data filed at 05/26/16 0830  Gross per 24 hour  Intake           1159.5 ml  Output              900 ml  Net            259.5 ml  Filed Weights   05/17/16 1743 05/22/16 1158  Weight: 69.9 kg (154 lb 1.6 oz) 69.9 kg (154 lb)    Exam: General exam: In no acute distress. Respiratory system: Good air movement and clear to auscultation. Cardiovascular system: S1 & S2 heard, RRR. Gastrointestinal system: Abdomen is nondistended, soft and nontender.  Extremities: No pedal edema. Skin: No rashes, lesions or ulcers    Data Reviewed:    Labs: Basic Metabolic Panel:  Recent Labs Lab 05/21/16 0340 05/22/16 0511 05/25/16 0330 05/26/16 0357 05/26/16 0358  NA 135 133* 131*  --  134*  K 3.9 3.7 3.3*  --  4.5  CL 101 99* 98*  --  99*  CO2 24 23 24   --  27  GLUCOSE 202* 217* 216*  --   268*  BUN 16 20 29*  --  29*  CREATININE 0.60* 0.72 0.76  --  0.80  CALCIUM 8.8* 8.5* 7.8*  --  8.7*  MG 1.4* 1.8  --  2.0  --    GFR Estimated Creatinine Clearance: 64.3 mL/min (by C-G formula based on SCr of 0.8 mg/dL). Liver Function Tests:  Recent Labs Lab 05/21/16 0340 05/23/16 0401 05/24/16 0521 05/25/16 0330 05/26/16 0358  AST 88* 209* 232* 118* 112*  ALT 105* 132* 153* 125* 121*  ALKPHOS 454* 509* 626* 598* 668*  BILITOT 6.1* 8.7* 6.5* 4.2* 4.1*  PROT 6.5 5.7* 5.7* 5.9* 6.4*  ALBUMIN 3.1* 2.7* 2.6* 2.6* 3.0*   No results for input(s): LIPASE, AMYLASE in the last 168 hours. No results for input(s): AMMONIA in the last 168 hours. Coagulation profile No results for input(s): INR, PROTIME in the last 168 hours.  CBC:  Recent Labs Lab 05/22/16 0511 05/23/16 0401 05/24/16 0521 05/25/16 0330 05/26/16 0358  WBC 9.5 8.4 9.2 9.7 9.9  HGB 9.0* 8.2* 9.0* 8.1* 8.6*  HCT 25.6* 23.1* 25.4* 22.8* 24.5*  MCV 89.8 88.8 89.1 88.7 90.1  PLT 291 255 289 261 274   Cardiac Enzymes: No results for input(s): CKTOTAL, CKMB, CKMBINDEX, TROPONINI in the last 168 hours. BNP (last 3 results) No results for input(s): PROBNP in the last 8760 hours. CBG:  Recent Labs Lab 05/25/16 0742 05/25/16 1144 05/25/16 1648 05/25/16 2159 05/26/16 0727  GLUCAP 215* 442* 371* 355* 214*   D-Dimer: No results for input(s): DDIMER in the last 72 hours. Hgb A1c: No results for input(s): HGBA1C in the last 72 hours. Lipid Profile: No results for input(s): CHOL, HDL, LDLCALC, TRIG, CHOLHDL, LDLDIRECT in the last 72 hours. Thyroid function studies: No results for input(s): TSH, T4TOTAL, T3FREE, THYROIDAB in the last 72 hours.  Invalid input(s): FREET3 Anemia work up: No results for input(s): VITAMINB12, FOLATE, FERRITIN, TIBC, IRON, RETICCTPCT in the last 72 hours. Sepsis Labs:  Recent Labs Lab 05/20/16 0527 05/21/16 0340 05/22/16 0511 05/23/16 0401 05/24/16 0521 05/25/16 0330  05/26/16 0358  PROCALCITON 1.13 0.73 0.79  --   --   --   --   WBC 6.8 8.6 9.5 8.4 9.2 9.7 9.9   Microbiology Recent Results (from the past 240 hour(s))  Culture, blood (routine x 2)     Status: None   Collection Time: 05/17/16  6:33 PM  Result Value Ref Range Status   Specimen Description BLOOD RIGHT ARM  Final   Special Requests BOTTLES DRAWN AEROBIC AND ANAEROBIC 5CC  Final   Culture   Final    NO GROWTH 5 DAYS Performed at [facility]    Report Status 05/22/2016 FINAL  Final  Culture, blood (routine x 2)     Status: None   Collection Time: 05/17/16  6:33 PM  Result Value Ref Range Status   Specimen Description BLOOD LEFT ARM  Final   Special Requests IN PEDIATRIC BOTTLE 4CC  Final   Culture   Final    NO GROWTH 5 DAYS Performed at [facility]    Report Status 05/22/2016 FINAL  Final  Urine culture     Status: Abnormal   Collection Time: 05/17/16 10:09 PM  Result Value Ref Range Status   Specimen Description URINE, CLEAN CATCH  Final   Special Requests NONE  Final   Culture MULTIPLE SPECIES PRESENT, SUGGEST RECOLLECTION (A)  Final   Report Status 05/19/2016 FINAL  Final     Medications:   . ciprofloxacin  400 mg Intravenous BID  . diltiazem  120 mg Oral Daily  . feeding supplement (ENSURE ENLIVE)  237 mL Oral BID BM  . finasteride  5 mg Oral Daily  . flecainide  50 mg Oral Q12H  . gabapentin  400 mg Oral QID  . insulin aspart  0-15 Units Subcutaneous TID WC  . insulin aspart  0-5 Units Subcutaneous QHS  . insulin aspart  3 Units Subcutaneous TID WC  . insulin glargine  10 Units Subcutaneous BID  . lidocaine  1 patch Transdermal Q24H  . magnesium oxide  400 mg Oral Daily  . metroNIDAZOLE  500 mg Oral Q8H  . polyethylene glycol  17 g Oral BID  . potassium chloride  40 mEq Oral BID  . rivaroxaban  20 mg Oral Q supper  . senna-docusate  1 tablet Oral BID  . sodium chloride flush  3 mL Intravenous Q12H   Continuous Infusions:   Time spent: 15  min   LOS: 9 days   Charlynne Cousins  [facility] Pager [phone]  *Please refer to [email or web address], password [email or web address] to get updated schedule on who will round on this patient, as hospitalists switch teams weekly. If 7PM-7AM, please contact night-coverage at [email or web address], password [email or web address] for any overnight needs.  05/26/2016, 9:38 AM

## 2016-05-26 NOTE — Progress Notes (Signed)
Patient Name: Andrew Norton Date of Encounter: 05/26/2016  Primary Cardiologist: Dr. Sande Rives Problem List     Principal Problem:   Bile duct obstruction Active Problems:   HYPERKALEMIA   Hyperlipidemia   Constipation   Jaundice   Diabetes mellitus type 2, noninsulin dependent (HCC)   LFT elevation   Leukocytosis   Ampulla of Vater mass   Paroxysmal atrial fibrillation (HCC)   Protein-calorie malnutrition, severe   Abnormal CT of the abdomen   Tachy-brady syndrome (HCC)   Sinus pause   NSVT (nonsustained ventricular tachycardia) (HCC)    Subjective   No chest pain or dyspnea; no palpitations or syncope  Inpatient Medications    Scheduled Meds: . ciprofloxacin  400 mg Intravenous BID  . diltiazem  120 mg Oral Daily  . feeding supplement (ENSURE ENLIVE)  237 mL Oral BID BM  . finasteride  5 mg Oral Daily  . flecainide  50 mg Oral Q12H  . gabapentin  400 mg Oral QID  . insulin aspart  0-15 Units Subcutaneous TID WC  . insulin aspart  0-5 Units Subcutaneous QHS  . insulin aspart  3 Units Subcutaneous TID WC  . insulin glargine  5 Units Subcutaneous BID  . lidocaine  1 patch Transdermal Q24H  . magnesium oxide  400 mg Oral Daily  . metroNIDAZOLE  500 mg Oral Q8H  . polyethylene glycol  17 g Oral BID  . potassium chloride  40 mEq Oral BID  . rivaroxaban  20 mg Oral Q supper  . senna-docusate  1 tablet Oral BID  . sodium chloride flush  3 mL Intravenous Q12H   Continuous Infusions:  PRN Meds: cyclobenzaprine, hydrALAZINE, HYDROcodone-acetaminophen   Vital Signs    Vitals:   05/25/16 0534 05/25/16 1419 05/25/16 2000 05/26/16 0625  BP: 129/65 (!) 128/46 131/89 (!) 159/62  Pulse: 68 66 (!) 116 68  Resp: 16 16 16 16   Temp: 97.4 F (36.3 C) 97.5 F (36.4 C) 97.7 F (36.5 C) 98 F (36.7 C)  TempSrc: Oral Oral Oral Oral  SpO2: 98% 100% 100% 100%  Weight:      Height:        Intake/Output Summary (Last 24 hours) at 05/26/16 0742 Last data filed  at 05/26/16 [id number]  Gross per 24 hour  Intake           1279.5 ml  Output              900 ml  Net            379.5 ml   Filed Weights   05/17/16 1743 05/22/16 1158  Weight: 154 lb 1.6 oz (69.9 kg) 154 lb (69.9 kg)    Physical Exam   GEN: Well nourished, well developed, elderly Caucasian male appearing in no acute distress.  HEENT: Grossly normal.  Neck: Supple Cardiac: RRR  Respiratory:  CTA GI: Soft, nontender, nondistended MS: no deformity or atrophy. Skin: warm and dry, no rash. Neuro:  Strength and sensation are intact. Psych: AAOx3.  Normal affect.  Labs    CBC  Recent Labs  05/25/16 0330 05/26/16 0358  WBC 9.7 9.9  HGB 8.1* 8.6*  HCT 22.8* 24.5*  MCV 88.7 90.1  PLT 261 [id number]   Basic Metabolic Panel  Recent Labs  05/25/16 0330 05/26/16 0358  NA 131* 134*  K 3.3* 4.5  CL 98* 99*  CO2 24 27  GLUCOSE 216* 268*  BUN 29* 29*  CREATININE 0.76 0.80  CALCIUM 7.8* 8.7*   Liver Function Tests  Recent Labs  05/25/16 0330 05/26/16 0358  AST 118* 112*  ALT 125* 121*  ALKPHOS 598* 668*  BILITOT 4.2* 4.1*  PROT 5.9* 6.4*  ALBUMIN 2.6* 3.0*     Telemetry    Episodes of PAF and > 5 sec pause on telemetry. Personally Reviewed   Radiology    Dg Ercp Biliary & Pancreatic Ducts  Result Date: 05/22/2016 CLINICAL DATA:  [place] EXAM: ERCP TECHNIQUE: Multiple spot images obtained with the fluoroscopic device and submitted for interpretation post-procedure. FLUOROSCOPY TIME:  Fluoroscopy Time:  5 minutes and 12 seconds Radiation Exposure Index (if provided by the fluoroscopic device): 87 mGy Number of Acquired Spot Images: 6 COMPARISON:  None. FINDINGS: The common bile duct is markedly dilated. There is a filling defect in the distal common bile duct. Contrast also fills the pancreatic duct. IMPRESSION: See above. These images were submitted for radiologic interpretation only. Please see the procedural report for the amount of contrast and the fluoroscopy time  utilized. Electronically Signed   By: Marybelle Killings M.D.   On: 05/22/2016 16:03    Cardiac Studies   Echocardiogram: 05/19/2016 Study Conclusions  - Left ventricle: The cavity size was normal. Wall thickness was   increased in a pattern of mild LVH. Systolic function was normal.   The estimated ejection fraction was in the range of 55% to 60%.   Wall motion was normal; there were no regional wall motion   abnormalities. Doppler parameters are consistent with abnormal   left ventricular relaxation (grade 1 diastolic dysfunction). - Aortic valve: There was trivial regurgitation. - Mitral valve: Calcified annulus. - Pulmonary arteries: Systolic pressure was mildly increased. PA   peak pressure: 52 mm Hg (S).  Impressions:  - Normal LV systolic function; grade 1 diastolic dysfunction; trace   AI; mild TR with mildly elevated pulmonary pressure.  Patient Profile     82 yo M w/ PMH of  DM, HTN, cervical myelopathy with gait instability admitted with new onset jaundice noted to develop atrial fibrillation with rapid ventricular response which was asymptomatic (paroxysms of AF/NSR on tele this adm). Tele remarkable for occasional nonsustained bradycardia with intermittent post-conversion pauses (up to 5 sec on 05/20/16), AF RVR, and brief NSVT.  Assessment & Plan    1. 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 Pathology negative for malignancy. - GI following elevated LFT's. No contraindications for possible PPM placement.  2. Paroxysmal atrial fib with RVR and Tachy-brady syndrome  - telemetry notable for post-conversion pauses (up to 5 seconds again this AM) with junctional bradycardia interspersed with AF RVR 140s. He is asymptomatic throughout. - Patient continues with tachybradycardia  syndrome on Cardizem CD 120 mg daily and flecainide 50 mg twice a day. We are trying to avoid pacemaker. See Dr. Jackalyn Lombard note. However given recurrence I'm not convinced we will be able to avoid pacemaker. We will continue on telemetry for at least another 24 hours and make final decision. The hope had been that flecainide would help maintain sinus rhythm and therefore posttermination pauses would be avoided. - Amiodarone is being avoided given his abnormal LFTs and jaundice. - This patients CHA2DS2-VASc Score and unadjusted Ischemic Stroke Rate (% per year) is equal to 4.8 % stroke rate/year from a score of  4 (HTN, DM, Age (2)). Has been started on Xarelto 20mg  daily.   3. Chronic Anemia - Hgb 8.1 this AM. Continue to follow closely while on anticoagulation. Denies any evidence of active bleeding.  4. HTN  - continue present meds  Signed, Kirk Ruths, MD  05/26/2016, 7:42 AM

## 2016-05-26 NOTE — Progress Notes (Signed)
Pt had 5.06 second pause on telemetry. Jonette Eva made aware. Will continue to monitor patient closely.

## 2016-05-26 NOTE — Progress Notes (Signed)
Received call from CCMD that patient was in A.fib rhythm, vital signs obtained & stable. EKG obtained. Pt resting comfortably with no pain. Jonette Eva on call notified. No new orders given. Will continue to monitor patient closely.

## 2016-05-27 DIAGNOSIS — I10 Essential (primary) hypertension: Secondary | ICD-10-CM

## 2016-05-27 LAB — COMPREHENSIVE METABOLIC PANEL
ALBUMIN: 2.9 g/dL — AB (ref 3.5–5.0)
ALT: 110 U/L — ABNORMAL HIGH (ref 17–63)
ANION GAP: 6 (ref 5–15)
AST: 90 U/L — ABNORMAL HIGH (ref 15–41)
Alkaline Phosphatase: 666 U/L — ABNORMAL HIGH (ref 38–126)
BUN: 26 mg/dL — ABNORMAL HIGH (ref 6–20)
CO2: 27 mmol/L (ref 22–32)
Calcium: 8.7 mg/dL — ABNORMAL LOW (ref 8.9–10.3)
Chloride: 99 mmol/L — ABNORMAL LOW (ref 101–111)
Creatinine, Ser: 0.78 mg/dL (ref 0.61–1.24)
GFR calc Af Amer: >60 mL/min (ref >60)
GFR calc non Af Amer: >60 mL/min (ref >60)
GLUCOSE: 206 mg/dL — AB (ref 65–99)
POTASSIUM: 4.7 mmol/L (ref 3.5–5.1)
SODIUM: 132 mmol/L — AB (ref 135–145)
Total Bilirubin: 3.6 mg/dL — ABNORMAL HIGH (ref 0.3–1.2)
Total Protein: 6.2 g/dL — ABNORMAL LOW (ref 6.5–8.1)

## 2016-05-27 LAB — GLUCOSE, CAPILLARY
GLUCOSE-CAPILLARY: 180 mg/dL — AB (ref 65–99)
GLUCOSE-CAPILLARY: 391 mg/dL — AB (ref 65–99)
GLUCOSE-CAPILLARY: 363 mg/dL — AB (ref 65–99)
Glucose-Capillary: 310 mg/dL — ABNORMAL HIGH (ref 65–99)

## 2016-05-27 LAB — CBC
HCT: 23.5 % — ABNORMAL LOW (ref 39.0–52.0)
Hemoglobin: 8.2 g/dL — ABNORMAL LOW (ref 13.0–17.0)
MCH: 31.9 pg (ref 26.0–34.0)
MCHC: 34.9 g/dL (ref 30.0–36.0)
MCV: 91.4 fL (ref 78.0–100.0)
PLATELETS: 221 10*3/uL (ref 150–400)
RBC: 2.57 MIL/uL — ABNORMAL LOW (ref 4.22–5.81)
RDW: 15.7 % — AB (ref 11.5–15.5)
WBC: 8.7 10*3/uL (ref 4.0–10.5)

## 2016-05-27 MED ORDER — SODIUM CHLORIDE 0.9 % IV SOLN
INTRAVENOUS | Status: AC
  Administered 2016-05-27: 10:00:00 via INTRAVENOUS

## 2016-05-27 MED ORDER — INSULIN GLARGINE 100 UNIT/ML ~~LOC~~ SOLN
20.0000 [IU] | Freq: Two times a day (BID) | SUBCUTANEOUS | Status: DC
  Administered 2016-05-27: 20 [IU] via SUBCUTANEOUS
  Filled 2016-05-27 (×3): qty 0.2

## 2016-05-27 MED ORDER — METOPROLOL TARTRATE 12.5 MG HALF TABLET
12.5000 mg | ORAL_TABLET | Freq: Two times a day (BID) | ORAL | Status: DC
Start: 2016-05-27 — End: 2016-05-30
  Administered 2016-05-27 – 2016-05-30 (×6): 12.5 mg via ORAL
  Filled 2016-05-27 (×6): qty 1

## 2016-05-27 NOTE — Progress Notes (Addendum)
Patient Name: Andrew Norton Date of Encounter: 05/27/2016  Primary Cardiologist: Dr. Sande Rives Problem List     Principal Problem:   Bile duct obstruction Active Problems:   HYPERKALEMIA   Hyperlipidemia   Constipation   Jaundice   Diabetes mellitus type 2, noninsulin dependent (HCC)   LFT elevation   Leukocytosis   Ampulla of Vater mass   Paroxysmal atrial fibrillation (HCC)   Protein-calorie malnutrition, severe   Abnormal CT of the abdomen   Tachy-brady syndrome (HCC)   Sinus pause   NSVT (nonsustained ventricular tachycardia) (HCC)    Subjective   Denies chest pain or SON, no dizziness.  Inpatient Medications    Scheduled Meds: . ciprofloxacin  400 mg Intravenous BID  . diltiazem  120 mg Oral Daily  . feeding supplement (GLUCERNA SHAKE)  237 mL Oral BID BM  . finasteride  5 mg Oral Daily  . flecainide  50 mg Oral Q12H  . gabapentin  400 mg Oral QID  . insulin aspart  0-15 Units Subcutaneous TID WC  . insulin aspart  0-5 Units Subcutaneous QHS  . insulin aspart  17 Units Subcutaneous Once  . insulin aspart  3 Units Subcutaneous TID WC  . insulin glargine  20 Units Subcutaneous BID  . lidocaine  1 patch Transdermal Q24H  . magnesium oxide  400 mg Oral Daily  . metroNIDAZOLE  500 mg Oral Q8H  . polyethylene glycol  17 g Oral BID  . rivaroxaban  20 mg Oral Q supper  . senna-docusate  1 tablet Oral BID  . sodium chloride flush  3 mL Intravenous Q12H   Continuous Infusions: . sodium chloride 50 mL/hr at 05/27/16 1026   PRN Meds: cyclobenzaprine, hydrALAZINE, HYDROcodone-acetaminophen   Vital Signs    Vitals:   05/26/16 1104 05/26/16 1317 05/26/16 2123 05/27/16 0504  BP: (!) 137/53 (!) 146/55 (!) 131/55 (!) 151/60  Pulse:  66 (!) 59 65  Resp:  16 16 16   Temp:  97.7 F (36.5 C) 98.3 F (36.8 C) 97.6 F (36.4 C)  TempSrc:  Oral Oral Oral  SpO2:  100% 100% 98%  Weight:      Height:        Intake/Output Summary (Last 24 hours) at 05/27/16  1202 Last data filed at 05/27/16 [id number]  Gross per 24 hour  Intake              720 ml  Output             1925 ml  Net            -1205 ml   Filed Weights   05/17/16 1743 05/22/16 1158  Weight: 154 lb 1.6 oz (69.9 kg) 154 lb (69.9 kg)    Physical Exam   GEN: Well nourished, well developed, elderly Caucasian male appearing in no acute distress.  HEENT: Grossly normal.  Neck: Supple Cardiac: RRR  Respiratory:  CTA GI: Soft, nontender, nondistended MS: no deformity or atrophy. Skin: warm and dry, no rash. Neuro:  Strength and sensation are intact. Psych: AAOx3.  Normal affect.  Labs    CBC  Recent Labs  05/26/16 0358 05/27/16 0540  WBC 9.9 8.7  HGB 8.6* 8.2*  HCT 24.5* 23.5*  MCV 90.1 91.4  PLT 274 [id number]   Basic Metabolic Panel  Recent Labs  05/26/16 0357 05/26/16 0358 05/27/16 0540  NA  --  134* 132*  K  --  4.5 4.7  CL  --  99* 99*  CO2  --  27 27  GLUCOSE  --  268* 206*  BUN  --  29* 26*  CREATININE  --  0.80 0.78  CALCIUM  --  8.7* 8.7*  MG 2.0  --   --    Liver Function Tests  Recent Labs  05/26/16 0358 05/27/16 0540  AST 112* 90*  ALT 121* 110*  ALKPHOS 668* 666*  BILITOT 4.1* 3.6*  PROT 6.4* 6.2*  ALBUMIN 3.0* 2.9*     Telemetry    Episodes of PAF and > 5 sec pause on telemetry. Personally Reviewed   Radiology    Dg Ercp Biliary & Pancreatic Ducts  Result Date: 05/22/2016 CLINICAL DATA:  [place] EXAM: ERCP TECHNIQUE: Multiple spot images obtained with the fluoroscopic device and submitted for interpretation post-procedure. FLUOROSCOPY TIME:  Fluoroscopy Time:  5 minutes and 12 seconds Radiation Exposure Index (if provided by the fluoroscopic device): 87 mGy Number of Acquired Spot Images: 6 COMPARISON:  None. FINDINGS: The common bile duct is markedly dilated. There is a filling defect in the distal common bile duct. Contrast also fills the pancreatic duct. IMPRESSION: See above. These images were submitted for radiologic  interpretation only. Please see the procedural report for the amount of contrast and the fluoroscopy time utilized. Electronically Signed   By: Marybelle Killings M.D.   On: 05/22/2016 16:03    Cardiac Studies   Echocardiogram: 05/19/2016 Study Conclusions  - Left ventricle: The cavity size was normal. Wall thickness was   increased in a pattern of mild LVH. Systolic function was normal.   The estimated ejection fraction was in the range of 55% to 60%.   Wall motion was normal; there were no regional wall motion   abnormalities. Doppler parameters are consistent with abnormal   left ventricular relaxation (grade 1 diastolic dysfunction). - Aortic valve: There was trivial regurgitation. - Mitral valve: Calcified annulus. - Pulmonary arteries: Systolic pressure was mildly increased. PA   peak pressure: 52 mm Hg (S).  Impressions:  - Normal LV systolic function; grade 1 diastolic dysfunction; trace   AI; mild TR with mildly elevated pulmonary pressure.  Patient Profile     82 yo M w/ PMH of  DM, HTN, cervical myelopathy with gait instability admitted with new onset jaundice noted to develop atrial fibrillation with rapid ventricular response which was asymptomatic (paroxysms of AF/NSR on tele this adm). Tele remarkable for occasional nonsustained bradycardia with intermittent post-conversion pauses (up to 5 sec on 05/20/16), AF RVR, and brief NSVT.  Assessment & Plan    1. 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 Pathology negative for malignancy. - GI following elevated LFT's. No contraindications for possible PPM placement.  2. Paroxysmal atrial fib with RVR and Tachy-brady syndrome  - telemetry notable for post-conversion pauses (up to 5 seconds yesterday) with junctional bradycardia  interspersed with AF RVR 140s. He has been in sinus bradycardia with a pause of 3.5 sec at 7 am this morning, always asymptomatic.   - Patient continues with tachybradycardia syndrome, I would discontinue Cardizem CD and start metoprolol 12.5 mg PO BID and flecainide 50 mg twice a day. We are trying to avoid pacemaker. See Dr. Jackalyn Lombard note. However given recurrence I'm not convinced we will be able to avoid pacemaker. We will continue on  telemetry for at least another 24 hours and make final decision. The hope had been that flecainide would help maintain sinus rhythm and therefore posttermination pauses would be avoided. - Amiodarone is being avoided given his abnormal LFTs and jaundice. - This patients CHA2DS2-VASc Score and unadjusted Ischemic Stroke Rate (% per year) is equal to 4.8 % stroke rate/year from a score of 4 (HTN, DM, Age (2)). Has been started on Xarelto 20mg  daily.   3. Chronic Anemia - Hgb 8.1 this AM. Continue to follow closely while on anticoagulation. Denies any evidence of active bleeding.  4. HTN  - add losartan 25 mg po daily  Signed, Ena Dawley, MD  05/27/2016, 12:02 PM

## 2016-05-27 NOTE — Progress Notes (Signed)
Patient has converted to junctional rhythm with rate in the 50s.  Patient's VSS at this time and patient is asymptomatic.  EKG has been performed.  BP 109/60   Pulse (!) 53   Temp 98.1 F (36.7 C) (Oral)   Resp 16   Ht 5\' 10"  (1.778 m)   Wt 69.9 kg (154 lb)   SpO2 100%   BMI 22.10 kg/m    Dr. Aundra Dubin on call, paged.  Dr. Venetia Constable has also been notified.  Will continue to monitor patient.

## 2016-05-27 NOTE — Progress Notes (Signed)
Patient HR down to 37 while asleep.  Patient's HR back to 40s/50s when awake.  Patient is asymptomatic and vitals are stable.  Cardiology NP has been paged and Dr. Venetia Constable notified.  Will continue to monitor patient.

## 2016-05-27 NOTE — Progress Notes (Signed)
[facility] PROGRESS NOTE    Progress Note  Andrew Norton  [id number] DOB: 18-Sep-1926 DOA: 05/17/2016 PCP: Laurey Morale, MD     Brief Narrative:   Andrew Norton is an 82 y.o. male past medical history of essential hypertension, non-insulin-dependent diabetes mellitus, neuropathy with gait instability they came into the hospital with new onset jaundice with an abdominal CT scan showed intrahepatic and biliary ductal dilation.  Assessment/Plan:   Ampullary lesion due to 1.2 cm mm Bile duct obstruction ERCP was done that showed biliary stone status post sphincterotomy and balloon distraction. Distal bile duct dilatation biopsies were done. Stent is not placed. Biopsy results was negative for malignancy. LFTs are trending down, will continue IV antibiotics for possible cholangitis while in-house.  New-onset atrial fibrillation/Tachy-brady syndrome ([place]): Continues to have multiple pauses, This morning around 7:30 AM he had a 3.5 second pulse. I appreciate EP's assistance, the patient and family decided to start flecainide, no events on telemetry. Appreciate cardiology's assistance. Continue monitor and replete electrolytes.  Leukocytosis: Afebrile, now resolved. Cont IV abx.   HYPERKALEMIA Resolved, acute magnesium greater than 2.  Diabetes mellitus type 2, noninsulin dependent (HCC) A1c of 7.6 glucose running high increase long-acting to increase the sliding scale. Continue hold oral hypoglycemic agents.  Protein-calorie malnutrition, severe  NSVT (nonsustained ventricular tachycardia) (HCC)  Essential hypertension: Continue hold Cozaar and diltiazem, blood pressure seems to be stable  Chronic constipation: Continue stool softener.  DVT prophylaxis: heparin Family Communication: Wife Disposition Plan/Barrier to D/C: 2-3 days. Code Status:     Code Status Orders        Start     Ordered   05/17/16 1829  Full code  Continuous     05/17/16 1828    Code  Status History    Date Active Date Inactive Code Status Order ID Comments User Context   04/13/2015 12:44 AM 04/16/2015  3:42 PM Full Code [id number]  Allyne Gee, MD Inpatient   11/05/2012  7:13 PM 11/08/2012  4:53 PM Full Code [id number]  [name], MD Inpatient    Advance Directive Documentation   Flowsheet Row Most Recent Value  Type of Advance Directive  Healthcare Power of Attorney, Living will  Pre-existing out of facility DNR order (yellow form or pink MOST form)  No data  "MOST" Form in Place?  No data        IV Access:    Peripheral IV   Procedures and diagnostic studies:   No results found.   Medical Consultants:    None.  Anti-Infectives:   None  Subjective:    Andrew Norton no complains feels great.   Objective:    Vitals:   05/26/16 1104 05/26/16 1317 05/26/16 2123 05/27/16 0504  BP: (!) 137/53 (!) 146/55 (!) 131/55 (!) 151/60  Pulse:  66 (!) 59 65  Resp:  16 16 16   Temp:  97.7 F (36.5 C) 98.3 F (36.8 C) 97.6 F (36.4 C)  TempSrc:  Oral Oral Oral  SpO2:  100% 100% 98%  Weight:      Height:        Intake/Output Summary (Last 24 hours) at 05/27/16 0809 Last data filed at 05/27/16 0506  Gross per 24 hour  Intake              960 ml  Output             1925 ml  Net             -  965 ml   Filed Weights   05/17/16 1743 05/22/16 1158  Weight: 69.9 kg (154 lb 1.6 oz) 69.9 kg (154 lb)    Exam: General exam: In no acute distress. Respiratory system: Good air movement and clear to auscultation. Cardiovascular system: S1 & S2 heard, RRR. Gastrointestinal system: Abdomen is nondistended, soft and nontender.  Extremities: No pedal edema. Skin: No rashes, lesions or ulcers    Data Reviewed:    Labs: Basic Metabolic Panel:  Recent Labs Lab 05/21/16 0340 05/22/16 0511 05/25/16 0330 05/26/16 0357 05/26/16 0358 05/27/16 0540  NA 135 133* 131*  --  134* 132*  K 3.9 3.7 3.3*  --  4.5 4.7  CL 101 99* 98*  --  99* 99*  CO2 24 23  24   --  27 27  GLUCOSE 202* 217* 216*  --  268* 206*  BUN 16 20 29*  --  29* 26*  CREATININE 0.60* 0.72 0.76  --  0.80 0.78  CALCIUM 8.8* 8.5* 7.8*  --  8.7* 8.7*  MG 1.4* 1.8  --  2.0  --   --    GFR Estimated Creatinine Clearance: 64.3 mL/min (by C-G formula based on SCr of 0.78 mg/dL). Liver Function Tests:  Recent Labs Lab 05/23/16 0401 05/24/16 0521 05/25/16 0330 05/26/16 0358 05/27/16 0540  AST 209* 232* 118* 112* 90*  ALT 132* 153* 125* 121* 110*  ALKPHOS 509* 626* 598* 668* 666*  BILITOT 8.7* 6.5* 4.2* 4.1* 3.6*  PROT 5.7* 5.7* 5.9* 6.4* 6.2*  ALBUMIN 2.7* 2.6* 2.6* 3.0* 2.9*   No results for input(s): LIPASE, AMYLASE in the last 168 hours. No results for input(s): AMMONIA in the last 168 hours. Coagulation profile No results for input(s): INR, PROTIME in the last 168 hours.  CBC:  Recent Labs Lab 05/23/16 0401 05/24/16 0521 05/25/16 0330 05/26/16 0358 05/27/16 0540  WBC 8.4 9.2 9.7 9.9 8.7  HGB 8.2* 9.0* 8.1* 8.6* 8.2*  HCT 23.1* 25.4* 22.8* 24.5* 23.5*  MCV 88.8 89.1 88.7 90.1 91.4  PLT 255 289 261 274 221   Cardiac Enzymes: No results for input(s): CKTOTAL, CKMB, CKMBINDEX, TROPONINI in the last 168 hours. BNP (last 3 results) No results for input(s): PROBNP in the last 8760 hours. CBG:  Recent Labs Lab 05/26/16 0727 05/26/16 1217 05/26/16 1703 05/26/16 2120 05/27/16 0725  GLUCAP 214* 424* 286* 334* 180*   D-Dimer: No results for input(s): DDIMER in the last 72 hours. Hgb A1c: No results for input(s): HGBA1C in the last 72 hours. Lipid Profile: No results for input(s): CHOL, HDL, LDLCALC, TRIG, CHOLHDL, LDLDIRECT in the last 72 hours. Thyroid function studies: No results for input(s): TSH, T4TOTAL, T3FREE, THYROIDAB in the last 72 hours.  Invalid input(s): FREET3 Anemia work up: No results for input(s): VITAMINB12, FOLATE, FERRITIN, TIBC, IRON, RETICCTPCT in the last 72 hours. Sepsis Labs:  Recent Labs Lab 05/21/16 0340  05/22/16 0511  05/24/16 0521 05/25/16 0330 05/26/16 0358 05/27/16 0540  PROCALCITON 0.73 0.79  --   --   --   --   --   WBC 8.6 9.5  < > 9.2 9.7 9.9 8.7  < > = values in this interval not displayed. Microbiology Recent Results (from the past 240 hour(s))  Culture, blood (routine x 2)     Status: None   Collection Time: 05/17/16  6:33 PM  Result Value Ref Range Status   Specimen Description BLOOD RIGHT ARM  Final   Special Requests BOTTLES DRAWN AEROBIC AND ANAEROBIC  5CC  Final   Culture   Final    NO GROWTH 5 DAYS Performed at [facility]    Report Status 05/22/2016 FINAL  Final  Culture, blood (routine x 2)     Status: None   Collection Time: 05/17/16  6:33 PM  Result Value Ref Range Status   Specimen Description BLOOD LEFT ARM  Final   Special Requests IN PEDIATRIC BOTTLE 4CC  Final   Culture   Final    NO GROWTH 5 DAYS Performed at [facility]    Report Status 05/22/2016 FINAL  Final  Urine culture     Status: Abnormal   Collection Time: 05/17/16 10:09 PM  Result Value Ref Range Status   Specimen Description URINE, CLEAN CATCH  Final   Special Requests NONE  Final   Culture MULTIPLE SPECIES PRESENT, SUGGEST RECOLLECTION (A)  Final   Report Status 05/19/2016 FINAL  Final     Medications:   . ciprofloxacin  400 mg Intravenous BID  . diltiazem  120 mg Oral Daily  . feeding supplement (GLUCERNA SHAKE)  237 mL Oral BID BM  . finasteride  5 mg Oral Daily  . flecainide  50 mg Oral Q12H  . gabapentin  400 mg Oral QID  . insulin aspart  0-15 Units Subcutaneous TID WC  . insulin aspart  0-5 Units Subcutaneous QHS  . insulin aspart  17 Units Subcutaneous Once  . insulin aspart  3 Units Subcutaneous TID WC  . insulin glargine  15 Units Subcutaneous BID  . lidocaine  1 patch Transdermal Q24H  . magnesium oxide  400 mg Oral Daily  . metroNIDAZOLE  500 mg Oral Q8H  . polyethylene glycol  17 g Oral BID  . rivaroxaban  20 mg Oral Q supper  . senna-docusate   1 tablet Oral BID  . sodium chloride flush  3 mL Intravenous Q12H   Continuous Infusions:   Time spent: 15 min   LOS: 10 days   Charlynne Cousins  [facility] Pager [phone]  *Please refer to [email or web address], password [email or web address] to get updated schedule on who will round on this patient, as hospitalists switch teams weekly. If 7PM-7AM, please contact night-coverage at [email or web address], password [email or web address] for any overnight needs.  05/27/2016, 8:09 AM

## 2016-05-28 ENCOUNTER — Encounter (HOSPITAL_COMMUNITY)
Admission: AD | Disposition: A | Discharge status: Rehabilitation Facility | Payer: Self-pay | Source: Ambulatory Visit | Attending: Internal Medicine

## 2016-05-28 HISTORY — PX: EP IMPLANTABLE DEVICE: SHX172B

## 2016-05-28 LAB — GLUCOSE, CAPILLARY
GLUCOSE-CAPILLARY: 340 mg/dL — AB (ref 65–99)
GLUCOSE-CAPILLARY: 178 mg/dL — AB (ref 65–99)
Glucose-Capillary: 219 mg/dL — ABNORMAL HIGH (ref 65–99)
Glucose-Capillary: 300 mg/dL — ABNORMAL HIGH (ref 65–99)

## 2016-05-28 LAB — CBC
HEMATOCRIT: 24.6 % — AB (ref 39.0–52.0)
HEMOGLOBIN: 8.7 g/dL — AB (ref 13.0–17.0)
MCH: 32.5 pg (ref 26.0–34.0)
MCHC: 35.4 g/dL (ref 30.0–36.0)
MCV: 91.8 fL (ref 78.0–100.0)
Platelets: 193 10*3/uL (ref 150–400)
RBC: 2.68 MIL/uL — ABNORMAL LOW (ref 4.22–5.81)
RDW: 15.9 % — AB (ref 11.5–15.5)
WBC: 10.6 10*3/uL — ABNORMAL HIGH (ref 4.0–10.5)

## 2016-05-28 SURGERY — PACEMAKER IMPLANT

## 2016-05-28 MED ORDER — LIDOCAINE HCL (PF) 1 % IJ SOLN
INTRAMUSCULAR | Status: DC | PRN
  Administered 2016-05-28: 45 mL

## 2016-05-28 MED ORDER — LIDOCAINE HCL (PF) 1 % IJ SOLN
INTRAMUSCULAR | Status: AC
  Filled 2016-05-28: qty 30

## 2016-05-28 MED ORDER — LOSARTAN POTASSIUM 50 MG PO TABS
25.0000 mg | ORAL_TABLET | Freq: Every day | ORAL | Status: DC
  Administered 2016-05-28 – 2016-05-30 (×3): 25 mg via ORAL
  Filled 2016-05-28 (×3): qty 1

## 2016-05-28 MED ORDER — INSULIN GLARGINE 100 UNIT/ML ~~LOC~~ SOLN
25.0000 [IU] | Freq: Two times a day (BID) | SUBCUTANEOUS | Status: DC
  Administered 2016-05-28 – 2016-05-30 (×4): 25 [IU] via SUBCUTANEOUS
  Filled 2016-05-28 (×6): qty 0.25

## 2016-05-28 MED ORDER — ONDANSETRON HCL 4 MG/2ML IJ SOLN: 4.0000 mg | Freq: Four times a day (QID) | INTRAMUSCULAR | Status: DC | PRN

## 2016-05-28 MED ORDER — VANCOMYCIN HCL IN DEXTROSE 1-5 GM/200ML-% IV SOLN
1000.0000 mg | Freq: Two times a day (BID) | INTRAVENOUS | Status: AC
  Administered 2016-05-29: 02:00:00 1000 mg via INTRAVENOUS
  Filled 2016-05-28: qty 200

## 2016-05-28 MED ORDER — MIDAZOLAM HCL 5 MG/5ML IJ SOLN
INTRAMUSCULAR | Status: AC
  Filled 2016-05-28: qty 5

## 2016-05-28 MED ORDER — IOPAMIDOL (ISOVUE-370) INJECTION 76%
INTRAVENOUS | Status: AC
  Filled 2016-05-28: qty 50

## 2016-05-28 MED ORDER — SODIUM CHLORIDE 0.9 % IV SOLN
INTRAVENOUS | Status: DC
  Administered 2016-05-28: 12:00:00 via INTRAVENOUS

## 2016-05-28 MED ORDER — HEPARIN (PORCINE) IN NACL 2-0.9 UNIT/ML-% IJ SOLN
INTRAMUSCULAR | Status: AC
Start: 2016-05-28 — End: 2016-05-28
  Filled 2016-05-28: qty 500

## 2016-05-28 MED ORDER — VANCOMYCIN HCL IN DEXTROSE 1-5 GM/200ML-% IV SOLN
INTRAVENOUS | Status: AC
  Filled 2016-05-28: qty 200

## 2016-05-28 MED ORDER — SODIUM CHLORIDE 0.9 % IR SOLN
Status: AC
  Filled 2016-05-28: qty 2

## 2016-05-28 MED ORDER — IOPAMIDOL (ISOVUE-370) INJECTION 76%
INTRAVENOUS | Status: DC | PRN
  Administered 2016-05-28: 15 mL via INTRAVENOUS

## 2016-05-28 MED ORDER — SODIUM CHLORIDE 0.9 % IV SOLN
INTRAVENOUS | Status: DC | PRN
  Administered 2016-05-28: 50 mL/h via INTRAVENOUS

## 2016-05-28 MED ORDER — YOU HAVE A PACEMAKER BOOK
Freq: Once | Status: AC
  Administered 2016-05-28: 20:00:00
  Filled 2016-05-28: qty 1

## 2016-05-28 MED ORDER — VANCOMYCIN HCL IN DEXTROSE 1-5 GM/200ML-% IV SOLN
1000.0000 mg | INTRAVENOUS | Status: AC
  Administered 2016-05-28: 1000 mg via INTRAVENOUS

## 2016-05-28 MED ORDER — SODIUM CHLORIDE 0.9 % IR SOLN
80.0000 mg | Status: AC
  Administered 2016-05-28: 80 mg

## 2016-05-28 MED ORDER — FENTANYL CITRATE (PF) 100 MCG/2ML IJ SOLN
INTRAMUSCULAR | Status: AC
  Filled 2016-05-28: qty 2

## 2016-05-28 MED ORDER — ACETAMINOPHEN 325 MG PO TABS: 325.0000 mg | ORAL_TABLET | ORAL | Status: DC | PRN

## 2016-05-28 MED ORDER — OFF THE BEAT BOOK
Freq: Once | Status: AC
  Administered 2016-05-28: 22:00:00
  Filled 2016-05-28: qty 1

## 2016-05-28 MED ORDER — SODIUM CHLORIDE 0.9 % IV SOLN: INTRAVENOUS | Status: DC | PRN

## 2016-05-28 MED ORDER — HEPARIN (PORCINE) IN NACL 2-0.9 UNIT/ML-% IJ SOLN
INTRAMUSCULAR | Status: DC | PRN
  Administered 2016-05-28: 16:00:00

## 2016-05-28 SURGICAL SUPPLY — 8 items
CABLE SURGICAL S-101-97-12 (CABLE) ×6 IMPLANT
LEAD CAPSURE NOVUS 5076-52CM (Lead) ×3 IMPLANT
LEAD CAPSURE NOVUS 5076-58CM (Lead) ×3 IMPLANT
PAD DEFIB LIFELINK (PAD) ×3 IMPLANT
PPM ADVISA MRI DR A2DR01 (Pacemaker) ×3 IMPLANT
SET INTRODUCER MICROPUNCT 5F (INTRODUCER) ×3 IMPLANT
SHEATH CLASSIC 7F (SHEATH) ×6 IMPLANT
TRAY PACEMAKER INSERTION (PACKS) ×3 IMPLANT

## 2016-05-28 NOTE — Progress Notes (Signed)
PT Cancellation Note  Patient Details Name: Andrew Norton MRN: [id number] DOB: August 04, 1926   Cancelled Treatment:    Reason Eval/Treat Not Completed: Patient at procedure or test/unavailable   Dago Jungwirth,KATHrine E 05/28/2016, 3:05 PM Carmelia Bake, PT, DPT 05/28/2016 Pager: [phone]

## 2016-05-28 NOTE — Progress Notes (Addendum)
Patient transferred to [facility] via [facility]. VSS. Condition stable. Eulas Post, RN

## 2016-05-28 NOTE — Progress Notes (Signed)
SUBJECTIVE: The patient is doing well today.  At this time, he denies chest pain, shortness of breath, or any new concerns. Had continued to have episodes of atrial fibrillaiton with bradycardia even though has had a rhythm control approach.  [facility] Hold] ciprofloxacin  400 mg Intravenous BID  . [MAR Hold] feeding supplement (GLUCERNA SHAKE)  237 mL Oral BID BM  . [MAR Hold] finasteride  5 mg Oral Daily  . [MAR Hold] flecainide  50 mg Oral Q12H  . [MAR Hold] gabapentin  400 mg Oral QID  . gentamicin irrigation  80 mg Irrigation On Call  . [MAR Hold] insulin aspart  0-15 Units Subcutaneous TID WC  . [MAR Hold] insulin aspart  0-5 Units Subcutaneous QHS  . [MAR Hold] insulin aspart  3 Units Subcutaneous TID WC  . [MAR Hold] insulin glargine  25 Units Subcutaneous BID  . [MAR Hold] lidocaine  1 patch Transdermal Q24H  . [MAR Hold] losartan  25 mg Oral Daily  . [MAR Hold] magnesium oxide  400 mg Oral Daily  . [MAR Hold] metoprolol tartrate  12.5 mg Oral BID  . [MAR Hold] metroNIDAZOLE  500 mg Oral Q8H  . [MAR Hold] polyethylene glycol  17 g Oral BID  . [MAR Hold] rivaroxaban  20 mg Oral Q supper  . [MAR Hold] senna-docusate  1 tablet Oral BID  . [facility] Hold] sodium chloride flush  3 mL Intravenous Q12H  . vancomycin  1,000 mg Intravenous On Call   . sodium chloride      OBJECTIVE: Physical Exam: Vitals:   05/27/16 2109 05/27/16 2115 05/28/16 0559 05/28/16 1139  BP: (!) 90/45 (!) 90/42 (!) 160/68 (!) 152/68  Pulse: (!) 43  61 65  Resp: 18  18   Temp: 97.8 F (36.6 C)  97.9 F (36.6 C)   TempSrc: Oral  Oral   SpO2: 100%  100%   Weight:      Height:        Intake/Output Summary (Last 24 hours) at 05/28/16 1513 Last data filed at 05/28/16 0600  Gross per 24 hour  Intake              480 ml  Output              600 ml  Net             -120 ml    Telemetry reveals sinus rhythm  GEN- The patient is well appearing, alert and oriented x 3 today.   Head- normocephalic,  atraumatic Eyes-  Sclera clear, conjunctiva pink Ears- hearing intact Oropharynx- clear Neck- supple, no JVP Lymph- no cervical lymphadenopathy Lungs- Clear to ausculation bilaterally, normal work of breathing Heart- Regular rate and rhythm, no murmurs, rubs or gallops, PMI not laterally displaced GI- soft, NT, ND, + BS Extremities- no clubbing, cyanosis, or edema Skin- no rash or lesion Psych- euthymic mood, full affect Neuro- strength and sensation are intact  LABS: Basic Metabolic Panel:  Recent Labs  05/26/16 0357 05/26/16 0358 05/27/16 0540  NA  --  134* 132*  K  --  4.5 4.7  CL  --  99* 99*  CO2  --  27 27  GLUCOSE  --  268* 206*  BUN  --  29* 26*  CREATININE  --  0.80 0.78  CALCIUM  --  8.7* 8.7*  MG 2.0  --   --    Liver Function Tests:  Recent Labs  05/26/16 0358 05/27/16 0540  AST 112* 90*  ALT 121* 110*  ALKPHOS 668* 666*  BILITOT 4.1* 3.6*  PROT 6.4* 6.2*  ALBUMIN 3.0* 2.9*   No results for input(s): LIPASE, AMYLASE in the last 72 hours. CBC:  Recent Labs  05/27/16 0540 05/28/16 0420  WBC 8.7 10.6*  HGB 8.2* 8.7*  HCT 23.5* 24.6*  MCV 91.4 91.8  PLT 221 193   Cardiac Enzymes: No results for input(s): CKTOTAL, CKMB, CKMBINDEX, TROPONINI in the last 72 hours. BNP: Invalid input(s): POCBNP D-Dimer: No results for input(s): DDIMER in the last 72 hours. Hemoglobin A1C: No results for input(s): HGBA1C in the last 72 hours. Fasting Lipid Panel: No results for input(s): CHOL, HDL, LDLCALC, TRIG, CHOLHDL, LDLDIRECT in the last 72 hours. Thyroid Function Tests: No results for input(s): TSH, T4TOTAL, T3FREE, THYROIDAB in the last 72 hours.  Invalid input(s): FREET3 Anemia Panel: No results for input(s): VITAMINB12, FOLATE, FERRITIN, TIBC, IRON, RETICCTPCT in the last 72 hours.  RADIOLOGY: US Abdomen Complete  Result Date: 05/23/2016 CLINICAL DATA:  Recent common bile duct calculus EXAM: ABDOMEN ULTRASOUND COMPLETE COMPARISON:  ERCP  May 22, 2016. FINDINGS: Gallbladder: No gallstones are evident. The gallbladder wall is thickened and edematous. There is no pericholecystic fluid. No sonographic Murphy sign noted by sonographer. Common bile duct: Diameter: 13 mm, dilated. There is moderate edema in the common hepatic and common bile duct. No mass or calculus is seen by ultrasound currently in the biliary ductal system. A small amount of air in the intrahepatic ducts may be due to the recent common bile duct manipulation. The intrahepatic biliary ducts do not appear appreciably dilated by ultrasound. Liver: No focal lesion identified. Liver echogenicity is overall increased. IVC: No abnormality visualized. Pancreas: Visualized portion unremarkable. Portions of the pancreatic tail are obscured by gas. Spleen: Size and appearance within normal limits. Right Kidney: Length: 12.6 cm. Echogenicity within normal limits. No mass or hydronephrosis visualized. There is renal cortical thinning. Left Kidney: Length: 11.6 cm. Echogenicity within normal limits. No mass or hydronephrosis visualized. There is renal cortical thinning. Abdominal aorta: No aneurysm visualized. Other findings: No demonstrable ascites. IMPRESSION: Gallbladder wall is thickened and edematous. No gallstones seen. A degree of acalculus cholecystitis must be of concern. Common bile duct remains dilated without mass or calculus evident by ultrasound. There is edema in the wall of the common bile duct. A small amount of air in the intrahepatic biliary ductal system is likely secondary to the recent biliary ductal manipulation. Portions of pancreas obscured by gas. Visualized portions of pancreas appear normal. Renal cortical thinning bilaterally may be a function of age or may be seen with medical renal disease. Kidneys otherwise appear unremarkable. Study otherwise unremarkable. Electronically Signed   By: Lowella Grip III M.D.   On: 05/23/2016 15:19   Ct Abdomen Pelvis W  Contrast  Result Date: 05/11/2016 CLINICAL DATA:  Jaundice for about 2 weeks, abdominal swelling EXAM: CT ABDOMEN AND PELVIS WITH CONTRAST TECHNIQUE: Multidetector CT imaging of the abdomen and pelvis was performed using the standard protocol following bolus administration of intravenous contrast. CONTRAST:  145mL ISOVUE-300 IOPAMIDOL (ISOVUE-300) INJECTION 61% COMPARISON:  None. FINDINGS: Lower chest: Lung bases shows mild peripheral interstitial prominence probable probable chronic in nature. No segmental infiltrate. Hepatobiliary: There is intrahepatic biliary ductal dilatation. No focal hepatic mass. No calcified gallstones are noted within gallbladder. There is significant CBD dilatation up to 1.8 cm. There is abrupt narrowing of CBD at the level of ampulla There is a polypoid lesion at the  level of ampulla within duodenum measures about 1.5 cm axial image 47. Ampullary mass cannot be excluded. Further correlation with ERCP is recommended. Pancreas: There is fatty replaced pancreas. No focal pancreatic mass. No dilatation of pancreatic duct. Spleen: Enhanced spleen is unremarkable. Adrenals/Urinary Tract: No adrenal gland mass. Mild for renal cortical thinning and lobulated contour probable due to atrophy. Bilateral renal symmetrical enhancement. Delayed renal images shows bilateral renal symmetrical excretion. Bilateral visualized proximal ureter is unremarkable. Stomach/Bowel: There is no small bowel obstruction. Oral contrast material was given to the patient. Some liquid stool noted in right colon. No pericecal inflammation. The terminal ileum is unremarkable. Liquid stool noted in transverse colon and descending colon. No evidence of obstructing mass. Moderate stool noted within redundant sigmoid colon. Moderate stool noted within rectum. No definite colonic obstruction. No colitis or acute diverticulitis. Vascular/Lymphatic: Mild atherosclerotic calcifications of distal abdominal aorta. No aortic  aneurysm. No retroperitoneal or mesenteric adenopathy. Reproductive: Prostate gland is unremarkable. No bladder filling defects are noted. There are metallic artifacts from left hip prosthesis. Other: No ascites or free abdominal air.  No inguinal adenopathy. Musculoskeletal: No destructive bony lesions are noted. Sagittal images of the spine shows diffuse osteopenia. Degenerative changes lumbar spine. There is mild to moderate compression deformity L1 vertebral body of indeterminate age. Clinical correlation is necessary. IMPRESSION: 1. There is intrahepatic biliary ductal dilatation. No focal hepatic mass. No calcified gallstones are noted within gallbladder. There is significant CBD dilatation up to 1.8 cm. There is abrupt narrowing of CBD at the level of ampulla There is a polypoid lesion at the level of ampulla within duodenum measures about 1.5 cm axial image 47. Ampullary mass cannot be excluded. Further correlation with ERCP is recommended. 2. Moderate colonic stool in distal colon. No definite evidence of acute colitis or diverticulitis. 3. No small bowel obstruction. 4. Metallic artifacts from left hip prosthesis. Compression deformity L1 vertebral body of indeterminate age. Clinical correlation is necessary. Electronically Signed   By: Lahoma Crocker M.D.   On: 05/11/2016 16:58   Dg Ercp Biliary & Pancreatic Ducts  Result Date: 05/22/2016 CLINICAL DATA:  Gallstone EXAM: ERCP TECHNIQUE: Multiple spot images obtained with the fluoroscopic device and submitted for interpretation post-procedure. FLUOROSCOPY TIME:  Fluoroscopy Time:  5 minutes and 12 seconds Radiation Exposure Index (if provided by the fluoroscopic device): 87 mGy Number of Acquired Spot Images: 6 COMPARISON:  None. FINDINGS: The common bile duct is markedly dilated. There is a filling defect in the distal common bile duct. Contrast also fills the pancreatic duct. IMPRESSION: See above. These images were submitted for radiologic interpretation  only. Please see the procedural report for the amount of contrast and the fluoroscopy time utilized. Electronically Signed   By: Marybelle Killings M.D.   On: 05/22/2016 16:03    ASSESSMENT AND PLAN:  Principal Problem:   Bile duct obstruction Active Problems:   HYPERKALEMIA   Hyperlipidemia   Constipation   Jaundice   Diabetes mellitus type 2, noninsulin dependent (HCC)   LFT elevation   Leukocytosis   Ampulla of Vater mass   Paroxysmal atrial fibrillation (HCC)   Protein-calorie malnutrition, severe   Abnormal CT of the abdomen   Tachy-brady syndrome (HCC)   Sinus pause   NSVT (nonsustained ventricular tachycardia) ([place]) 1. Tachy-brady syndrome: plan for pacemaker today.  Risks and benefits discussed.  Risks include but not limited to bleeding, infection, tamponade, pneumothorax.  The patient understands the risks and has agreed to the procedure.  2. Paroxysmal  atrial fibrillation: on flecainide with post conversion pauses of up to 5 seconds.  Continue anticoagulation   This patients CHA2DS2-VASc Score and unadjusted Ischemic Stroke Rate (% per year) is equal to 4.8 % stroke rate/year from a score of 4  Above score calculated as 1 point each if present [CHF, HTN, DM, Vascular=MI/PAD/Aortic Plaque, Age if 65-74, or Male] Above score calculated as 2 points each if present [Age > 75, or Stroke/TIA/TE]     Fabio Wah Meredith Leeds, MD 05/28/2016 3:13 PM

## 2016-05-28 NOTE — Progress Notes (Signed)
[facility] PROGRESS NOTE    Progress Note  Andrew Norton  [id number] DOB: January 25, 1927 DOA: 05/17/2016 PCP: Laurey Morale, MD     Brief Narrative:   Andrew Norton is an 82 y.o. male past medical history of essential hypertension, non-insulin-dependent diabetes mellitus, neuropathy with gait instability they came into the hospital with new onset jaundice with an abdominal CT scan showed intrahepatic and biliary ductal dilation. ERCP done and stone removed, biopsy negative for malignancy. Started having pauses on telemetry. Cardiology consulted and started flecainide.  Assessment/Plan:   Ampullary lesion due to 1.2 cm mm Bile duct obstruction: ERCP was done that showed biliary stone status post sphincterotomy and balloon distraction. Distal bile duct dilatation biopsies were done. Stent is not placed. Biopsy results was negative for malignancy. Cont Cipro and flagyl.  New-onset atrial fibrillation/Tachy-brady syndrome ([place]): Continues to have multiple pauses. I appreciate EP's assistance, the patient and family decided to start flecainide. Cont to monitor for ad additional 24 hrs. Continue monitor and replete electrolytes. Cardiology on board, undecided about pacer placement  Leukocytosis: Afebrile, now resolved. Cont  abx.   HYPERKALEMIA Resolved.  Diabetes mellitus type 2, noninsulin dependent (HCC) A1c of 7.6 glucose running high increase long-acting to increase the sliding scale. Continue hold oral hypoglycemic agents.  Protein-calorie malnutrition, severe  NSVT (nonsustained ventricular tachycardia) (HCC)  Essential hypertension: Continue hold Cozaar and diltiazem, blood pressure seems to be stable  Chronic constipation: Continue stool softener.  DVT prophylaxis: heparin Family Communication: Wife Disposition Plan/Barrier to D/C: hopefully am Code Status:     Code Status Orders        Start     Ordered   05/17/16 1829  Full code  Continuous     05/17/16 1828    Code Status History    Date Active Date Inactive Code Status Order ID Comments User Context   04/13/2015 12:44 AM 04/16/2015  3:42 PM Full Code [id number]  Allyne Gee, MD Inpatient   11/05/2012  7:13 PM 11/08/2012  4:53 PM Full Code [id number]  [name], MD Inpatient    Advance Directive Documentation   Flowsheet Row Most Recent Value  Type of Advance Directive  Healthcare Power of Attorney, Living will  Pre-existing out of facility DNR order (yellow form or pink MOST form)  No data  "MOST" Form in Place?  No data        IV Access:    Peripheral IV   Procedures and diagnostic studies:   No results found.   Medical Consultants:    None.  Anti-Infectives:   None  Subjective:    Andrew Norton no complains feels great.   Objective:    Vitals:   05/27/16 2109 05/27/16 2115 05/28/16 0559 05/28/16 1139  BP: (!) 90/45 (!) 90/42 (!) 160/68 (!) 152/68  Pulse: (!) 43  61 65  Resp: 18  18   Temp: 97.8 F (36.6 C)  97.9 F (36.6 C)   TempSrc: Oral  Oral   SpO2: 100%  100%   Weight:      Height:        Intake/Output Summary (Last 24 hours) at 05/28/16 1343 Last data filed at 05/28/16 0600  Gross per 24 hour  Intake           708.33 ml  Output              600 ml  Net           108.33 ml  Filed Weights   05/17/16 1743 05/22/16 1158  Weight: 69.9 kg (154 lb 1.6 oz) 69.9 kg (154 lb)    Exam: General exam: In no acute distress. Respiratory system: Good air movement and clear to auscultation. Cardiovascular system: S1 & S2 heard, RRR. Gastrointestinal system: Abdomen is nondistended, soft and nontender.  Extremities: No pedal edema. Skin: No rashes, lesions or ulcers    Data Reviewed:    Labs: Basic Metabolic Panel:  Recent Labs Lab 05/22/16 0511 05/25/16 0330 05/26/16 0357 05/26/16 0358 05/27/16 0540  NA 133* 131*  --  134* 132*  K 3.7 3.3*  --  4.5 4.7  CL 99* 98*  --  99* 99*  CO2 23 24  --  27 27  GLUCOSE 217* 216*   --  268* 206*  BUN 20 29*  --  29* 26*  CREATININE 0.72 0.76  --  0.80 0.78  CALCIUM 8.5* 7.8*  --  8.7* 8.7*  MG 1.8  --  2.0  --   --    GFR Estimated Creatinine Clearance: 64.3 mL/min (by C-G formula based on SCr of 0.78 mg/dL). Liver Function Tests:  Recent Labs Lab 05/23/16 0401 05/24/16 0521 05/25/16 0330 05/26/16 0358 05/27/16 0540  AST 209* 232* 118* 112* 90*  ALT 132* 153* 125* 121* 110*  ALKPHOS 509* 626* 598* 668* 666*  BILITOT 8.7* 6.5* 4.2* 4.1* 3.6*  PROT 5.7* 5.7* 5.9* 6.4* 6.2*  ALBUMIN 2.7* 2.6* 2.6* 3.0* 2.9*   No results for input(s): LIPASE, AMYLASE in the last 168 hours. No results for input(s): AMMONIA in the last 168 hours. Coagulation profile No results for input(s): INR, PROTIME in the last 168 hours.  CBC:  Recent Labs Lab 05/24/16 0521 05/25/16 0330 05/26/16 0358 05/27/16 0540 05/28/16 0420  WBC 9.2 9.7 9.9 8.7 10.6*  HGB 9.0* 8.1* 8.6* 8.2* 8.7*  HCT 25.4* 22.8* 24.5* 23.5* 24.6*  MCV 89.1 88.7 90.1 91.4 91.8  PLT 289 261 274 221 193   Cardiac Enzymes: No results for input(s): CKTOTAL, CKMB, CKMBINDEX, TROPONINI in the last 168 hours. BNP (last 3 results) No results for input(s): PROBNP in the last 8760 hours. CBG:  Recent Labs Lab 05/27/16 1207 05/27/16 1731 05/27/16 2107 05/28/16 0740 05/28/16 1204  GLUCAP 391* 310* 363* 219* 340*   D-Dimer: No results for input(s): DDIMER in the last 72 hours. Hgb A1c: No results for input(s): HGBA1C in the last 72 hours. Lipid Profile: No results for input(s): CHOL, HDL, LDLCALC, TRIG, CHOLHDL, LDLDIRECT in the last 72 hours. Thyroid function studies: No results for input(s): TSH, T4TOTAL, T3FREE, THYROIDAB in the last 72 hours.  Invalid input(s): FREET3 Anemia work up: No results for input(s): VITAMINB12, FOLATE, FERRITIN, TIBC, IRON, RETICCTPCT in the last 72 hours. Sepsis Labs:  Recent Labs Lab 05/22/16 0511  05/25/16 0330 05/26/16 0358 05/27/16 0540 05/28/16 0420    PROCALCITON 0.79  --   --   --   --   --   WBC 9.5  < > 9.7 9.9 8.7 10.6*  < > = values in this interval not displayed. Microbiology No results found for this or any previous visit (from the past 240 hour(s)).   Medications:   . ciprofloxacin  400 mg Intravenous BID  . feeding supplement (GLUCERNA SHAKE)  237 mL Oral BID BM  . finasteride  5 mg Oral Daily  . flecainide  50 mg Oral Q12H  . gabapentin  400 mg Oral QID  . gentamicin irrigation  80 mg  Irrigation On Call  . insulin aspart  0-15 Units Subcutaneous TID WC  . insulin aspart  0-5 Units Subcutaneous QHS  . insulin aspart  3 Units Subcutaneous TID WC  . insulin glargine  20 Units Subcutaneous BID  . lidocaine  1 patch Transdermal Q24H  . losartan  25 mg Oral Daily  . magnesium oxide  400 mg Oral Daily  . metoprolol tartrate  12.5 mg Oral BID  . metroNIDAZOLE  500 mg Oral Q8H  . polyethylene glycol  17 g Oral BID  . rivaroxaban  20 mg Oral Q supper  . senna-docusate  1 tablet Oral BID  . sodium chloride flush  3 mL Intravenous Q12H  . vancomycin  1,000 mg Intravenous On Call   Continuous Infusions: . sodium chloride      Time spent: 15 min   LOS: 11 days   Charlynne Cousins  [facility] Pager [phone]  *Please refer to [email or web address], password [email or web address] to get updated schedule on who will round on this patient, as hospitalists switch teams weekly. If 7PM-7AM, please contact night-coverage at [email or web address], password [email or web address] for any overnight needs.  05/28/2016, 1:43 PM

## 2016-05-28 NOTE — Progress Notes (Signed)
Patient Name: Andrew Norton Date of Encounter: 05/28/2016  Primary Cardiologist: Dr. Sande Rives Problem List     Principal Problem:   Bile duct obstruction Active Problems:   HYPERKALEMIA   Hyperlipidemia   Constipation   Jaundice   Diabetes mellitus type 2, noninsulin dependent (HCC)   LFT elevation   Leukocytosis   Ampulla of Vater mass   Paroxysmal atrial fibrillation (HCC)   Protein-calorie malnutrition, severe   Abnormal CT of the abdomen   Tachy-brady syndrome (HCC)   Sinus pause   NSVT (nonsustained ventricular tachycardia) ([place])     Subjective   Feels well but frustrated with arrhthymias. He would just as soon get PPM -   Inpatient Medications    Scheduled Meds: . ciprofloxacin  400 mg Intravenous BID  . diltiazem  120 mg Oral Daily  . feeding supplement (GLUCERNA SHAKE)  237 mL Oral BID BM  . finasteride  5 mg Oral Daily  . flecainide  50 mg Oral Q12H  . gabapentin  400 mg Oral QID  . insulin aspart  0-15 Units Subcutaneous TID WC  . insulin aspart  0-5 Units Subcutaneous QHS  . insulin aspart  3 Units Subcutaneous TID WC  . insulin glargine  20 Units Subcutaneous BID  . lidocaine  1 patch Transdermal Q24H  . magnesium oxide  400 mg Oral Daily  . metoprolol tartrate  12.5 mg Oral BID  . metroNIDAZOLE  500 mg Oral Q8H  . polyethylene glycol  17 g Oral BID  . rivaroxaban  20 mg Oral Q supper  . senna-docusate  1 tablet Oral BID  . sodium chloride flush  3 mL Intravenous Q12H   Continuous Infusions: . sodium chloride 50 mL/hr at 05/27/16 1026   PRN Meds: cyclobenzaprine, hydrALAZINE, HYDROcodone-acetaminophen   Vital Signs    Vitals:   05/27/16 1703 05/27/16 2109 05/27/16 2115 05/28/16 0559  BP: (!) 106/57 (!) 90/45 (!) 90/42 (!) 160/68  Pulse: 60 (!) 43  61  Resp: 18 18  18   Temp: 98.1 F (36.7 C) 97.8 F (36.6 C)  97.9 F (36.6 C)  TempSrc: Oral Oral  Oral  SpO2: 100% 100%  100%  Weight:      Height:        Intake/Output  Summary (Last 24 hours) at 05/28/16 0819 Last data filed at 05/28/16 0600  Gross per 24 hour  Intake          1068.33 ml  Output              800 ml  Net           268.33 ml   Filed Weights   05/17/16 1743 05/22/16 1158  Weight: 154 lb 1.6 oz (69.9 kg) 154 lb (69.9 kg)    Physical Exam    GEN: Well nourished,  in no acute distress.  HEENT: normocephalic, sclera clear, mucus membranes moist.  Neck: Supple, no JVD Cardiac: RRR, no murmurs, rubs, or gallops. No clubbing, cyanosis, edema.  Radials/DP/PT 2+ and equal bilaterally.  Respiratory:  Respirations regular and unlabored, clear to auscultation bilaterally -ant without rales, rhonchi or wheezes. GI: Abd -Soft, nontender, nondistended, BS + x 4. MS: no deformity or atrophy. Skin: warm and dry, brisk capillary refill, no obvious rash Neuro:  Alert and oriented X 3 MAE, follows commands Psych: answers questions appropriately,Normal and pleasant affect.   Labs    CBC  Recent Labs  05/27/16 0540 05/28/16 0420  WBC 8.7 10.6*  HGB 8.2* 8.7*  HCT 23.5* 24.6*  MCV 91.4 91.8  PLT 221 0000000   Basic Metabolic Panel  Recent Labs  05/26/16 0357 05/26/16 0358 05/27/16 0540  NA  --  134* 132*  K  --  4.5 4.7  CL  --  99* 99*  CO2  --  27 27  GLUCOSE  --  268* 206*  BUN  --  29* 26*  CREATININE  --  0.80 0.78  CALCIUM  --  8.7* 8.7*  MG 2.0  --   --    Liver Function Tests  Recent Labs  05/26/16 0358 05/27/16 0540  AST 112* 90*  ALT 121* 110*  ALKPHOS 668* 666*  BILITOT 4.1* 3.6*  PROT 6.4* 6.2*  ALBUMIN 3.0* 2.9*   No results for input(s): LIPASE, AMYLASE in the last 72 hours. Cardiac Enzymes No results for input(s): CKTOTAL, CKMB, CKMBINDEX, TROPONINI in the last 72 hours. BNP Invalid input(s): POCBNP D-Dimer No results for input(s): DDIMER in the last 72 hours. Hemoglobin A1C No results for input(s): HGBA1C in the last 72 hours. Fasting Lipid Panel No results for input(s): CHOL, HDL, LDLCALC, TRIG,  CHOLHDL, LDLDIRECT in the last 72 hours. Thyroid Function Tests No results for input(s): TSH, T4TOTAL, T3FREE, THYROIDAB in the last 72 hours.  Invalid input(s): FREET3  Telemetry    He had junctional rhythm yesterday 37 or so and his pm dose of BB held today SR - Personally Reviewed  ECG    05/27/16 junctional rhythm at 53 - Personally Reviewed  Radiology    No results found.  Cardiac Studies   Echocardiogram: 05/19/2016 Study Conclusions  - Left ventricle: The cavity size was normal. Wall thickness was increased in a pattern of mild LVH. Systolic function was normal. The estimated ejection fraction was in the range of 55% to 60%. Wall motion was normal; there were no regional wall motion abnormalities. Doppler parameters are consistent with abnormal left ventricular relaxation (grade 1 diastolic dysfunction). - Aortic valve: There was trivial regurgitation. - Mitral valve: Calcified annulus. - Pulmonary arteries: Systolic pressure was mildly increased. PA peak pressure: 52 mm Hg (S).  Impressions:  - Normal LV systolic function; grade 1 diastolic dysfunction; trace AI; mild TR with mildly elevated pulmonary pressure.  Patient Profile     82 yo M w/ PMH of  DM, HTN, cervical myelopathy with gait instability admitted with new onset jaundice noted to develop atrial fibrillation with rapid ventricular response which was asymptomatic (paroxysms of AF/NSR on tele this adm). Tele remarkable for occasional nonsustained bradycardia with intermittent post-conversion pauses (up to 5 sec on 05/20/16), AF RVR, and brief NSVT.  Assessment & Plan    1. 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 Pathology negative for malignancy. - GI following elevated LFT's. No  contraindications for possible PPM placement.  2. Paroxysmal atrial fib with RVR and Tachy-brady syndrome  - telemetry notable for post-conversion pauses (up to 5 seconds yesterday) with junctional bradycardia interspersed with AF RVR 140s. He has been in sinus bradycardia with a pause of 3.5 sec at 7 am yesterday morning, always asymptomatic.  Last evening with Junct rhythm in the low 40s to 37 then SB with pauses and junctional escape beats.   - Patient continues with tachybradycardia syndrome, Cardizem CD was to be stopped  but continued yesterday with BB-- I stopped today 05/18/16 and metoprolol 12.5 mg PO BID  was started yesterday  Per Dr. Meda Coffee "However given recurrence I'm not convinced we will be able to avoid pacemaker." We will continue on telemetry for at least another 24 hours and make final decision. The hope had been that flecainide would help maintain sinus rhythm and therefore posttermination pauses would be avoided. - Amiodarone is being avoided given his abnormal LFTs and jaundice. - This patients CHA2DS2-VASc Score and unadjusted Ischemic Stroke Rate (% per year) is equal to 4.8 % stroke rate/year from a score of 4 (HTN, DM, Age (2)). Has been started on Xarelto 20mg  daily.  - Bradycardia last pm may have been in part due to dilt, BB and flecainide   3. Chronic Anemia - Hgb 8.7 this AM. Continue to follow closely while on anticoagulation. Denies any evidence of active bleeding.  4. HTN  - added losartan 25 mg po daily-- labile BP but have stopped Dilt.   Dr. Sallyanne Kuster will see today and family hopes plan will be made, they are frustrated.  Signed, Cecilie Kicks, NP  05/28/2016, 8:19 AM  [place] Pager [phone]  After 5 or weekends [phone]   I have seen and examined the patient along with Cecilie Kicks, NP .  I have reviewed the chart, notes and new data.  I agree with NP's note.  Key new complaints: on more detailed interrogation and  discussion with his family, he has had fatigue and exhaustion for months. No syncope. Dizzy today Key examination changes: no CHF, no jaundice Key new findings / data: frequent episodes of sinus pauses, frequent junctional escape in high 30s,  PLAN: Tachy-brady sd., not progressing well on medical Rx Proceed with dual chamber PPM with Dr. Curt Bears this PM. This procedure has been fully reviewed with the patient and written informed consent has been obtained.   Sanda Klein, MD, [place] [phone] 05/28/2016, 11:47 AM

## 2016-05-28 NOTE — Progress Notes (Signed)
Nutrition Follow-up  DOCUMENTATION CODES:   Severe malnutrition in context of acute illness/injury  INTERVENTION:   Continue Glucerna Shake po TID, each supplement provides 220 kcal and 10 grams of protein Continue to encourage adequate intake of calories and protein via meals and beverages.  RD to continue to monitor for needs  NUTRITION DIAGNOSIS:   Increased nutrient needs related to catabolic illness (cholangitis) as evidenced by estimated needs.  Ongoing.  GOAL:   Patient will meet greater than or equal to 90% of their needs  Progressing.  MONITOR:   PO intake, Supplement acceptance, Labs, Weight trends, I & O's  ASSESSMENT:   82 y.o. male with history of HTN, noninsulin dependent dm2, chronic constipation, B12 deficiency, neuropathy with gait instability is sent from [facility] for direct admission. Patient is referred to [facility] by pmd due to new onset of jaundice and abnormal CT ab/pel scan which showed intrahepatic biliary ductal dilatation.  Patient consuming 50-100% of meals over the past 48 hours. Supplement was switched to Glucerna shakes given elevated CBGs. Will monitor for tolerance and acceptance of this supplement vs. Ensure.  No new weight obtained since 11/7.  Medications: MAG-OX tablet daily Labs reviewed: CBGs: 219-363 Low Na  Diet Order:  Diet Heart Room service appropriate? Yes; Fluid consistency: Thin  Skin:  Reviewed, no issues  Last BM:  11/12  Height:   Ht Readings from Last 1 Encounters:  05/22/16 5\' 10"  (1.778 m)    Weight:   Wt Readings from Last 1 Encounters:  05/22/16 154 lb (69.9 kg)    Ideal Body Weight:  75.45 kg  BMI:  Body mass index is 22.1 kg/m.  Estimated Nutritional Needs:   Kcal:  1800-2000 (MSJ x 1.3-1.5)  Protein:  90-105 grams (1.3-1.5 grams/kg)  Fluid:  >/= 1.7 L/day (25 ml/kg)  EDUCATION NEEDS:   Education needs addressed  Clayton Bibles, MS, RD, LDN Pager: [phone] After Hours Pager:  [phone]

## 2016-05-29 ENCOUNTER — Telehealth: Payer: Self-pay | Admitting: Family Medicine

## 2016-05-29 ENCOUNTER — Encounter (HOSPITAL_COMMUNITY): Payer: Self-pay | Admitting: Cardiology

## 2016-05-29 ENCOUNTER — Inpatient Hospital Stay (HOSPITAL_COMMUNITY): Payer: Medicare Other

## 2016-05-29 LAB — GLUCOSE, CAPILLARY
GLUCOSE-CAPILLARY: 126 mg/dL — AB (ref 65–99)
GLUCOSE-CAPILLARY: 292 mg/dL — AB (ref 65–99)
GLUCOSE-CAPILLARY: 227 mg/dL — AB (ref 65–99)
GLUCOSE-CAPILLARY: 147 mg/dL — AB (ref 65–99)

## 2016-05-29 MED ORDER — FLECAINIDE ACETATE 50 MG PO TABS: 50.0000 mg | ORAL_TABLET | Freq: Two times a day (BID) | ORAL | 0 refills | Status: DC

## 2016-05-29 MED ORDER — RIVAROXABAN 20 MG PO TABS: 20.0000 mg | ORAL_TABLET | Freq: Every day | ORAL | 0 refills | Status: DC

## 2016-05-29 MED ORDER — METOPROLOL TARTRATE 25 MG PO TABS: 12.5000 mg | ORAL_TABLET | Freq: Two times a day (BID) | ORAL | 0 refills | Status: DC

## 2016-05-29 MED ORDER — LOSARTAN POTASSIUM 25 MG PO TABS: 25.0000 mg | ORAL_TABLET | Freq: Every day | ORAL | 0 refills | Status: DC

## 2016-05-29 NOTE — Progress Notes (Signed)
SUBJECTIVE: The patient is doing well today.  At this time, he denies chest pain, shortness of breath, or any new concerns.  CURRENT MEDICATIONS: . ciprofloxacin  400 mg Intravenous BID  . feeding supplement (GLUCERNA SHAKE)  237 mL Oral BID BM  . finasteride  5 mg Oral Daily  . flecainide  50 mg Oral Q12H  . gabapentin  400 mg Oral QID  . insulin aspart  0-15 Units Subcutaneous TID WC  . insulin aspart  0-5 Units Subcutaneous QHS  . insulin aspart  3 Units Subcutaneous TID WC  . insulin glargine  25 Units Subcutaneous BID  . lidocaine  1 patch Transdermal Q24H  . losartan  25 mg Oral Daily  . magnesium oxide  400 mg Oral Daily  . metoprolol tartrate  12.5 mg Oral BID  . metroNIDAZOLE  500 mg Oral Q8H  . polyethylene glycol  17 g Oral BID  . rivaroxaban  20 mg Oral Q supper  . senna-docusate  1 tablet Oral BID  . sodium chloride flush  3 mL Intravenous Q12H     OBJECTIVE: Physical Exam: Vitals:   05/28/16 1714 05/28/16 2000 05/28/16 2005 05/29/16 0425  BP: (!) 188/75  (!) 162/67 (!) 130/59  Pulse: 60 60 62 60  Resp: 15 19 20  (!) 21  Temp: 97.6 F (36.4 C)  98.2 F (36.8 C) 98.2 F (36.8 C)  TempSrc: Axillary  Oral Oral  SpO2: 100% 100% 100% 99%  Weight:    157 lb 13.6 oz (71.6 kg)  Height:        Intake/Output Summary (Last 24 hours) at 05/29/16 0725 Last data filed at 05/29/16 [id number]  Gross per 24 hour  Intake              200 ml  Output             1950 ml  Net            -1750 ml    Telemetry reveals atrial pacing with intrinsic ventricular conduction  GEN- The patient is elderly appearing, alert and oriented x 3 today.   Head- normocephalic, atraumatic Eyes-  Sclera clear, conjunctiva pink Ears- hearing intact Oropharynx- clear Neck- supple Lungs- Clear to ausculation bilaterally, normal work of breathing Heart- Regular rate and rhythm, 2/6 SEM GI- soft, NT, ND, + BS Extremities- no clubbing, cyanosis, or edema Skin- no rash or lesion, left chest  incision without hematoma/significant ecchymosis  Psych- euthymic mood, full affect Neuro- strength and sensation are intact  LABS: Basic Metabolic Panel:  Recent Labs  05/27/16 0540  NA 132*  K 4.7  CL 99*  CO2 27  GLUCOSE 206*  BUN 26*  CREATININE 0.78  CALCIUM 8.7*   Liver Function Tests:  Recent Labs  05/27/16 0540  AST 90*  ALT 110*  ALKPHOS 666*  BILITOT 3.6*  PROT 6.2*  ALBUMIN 2.9*   CBC:  Recent Labs  05/27/16 0540 05/28/16 0420  WBC 8.7 10.6*  HGB 8.2* 8.7*  HCT 23.5* 24.6*  MCV 91.4 91.8  PLT 221 193    ASSESSMENT AND PLAN:  Principal Problem:   Bile duct obstruction Active Problems:   HYPERKALEMIA   Hyperlipidemia   Constipation   Jaundice   Diabetes mellitus type 2, noninsulin dependent (HCC)   LFT elevation   Leukocytosis   Ampulla of Vater mass   Paroxysmal atrial fibrillation (HCC)   Protein-calorie malnutrition, severe   Abnormal CT of the abdomen   Tachy-brady  syndrome (HCC)   Sinus pause   NSVT (nonsustained ventricular tachycardia) ([place])  1.  Tachy/brady syndrome S/p PPM Device interrogation this morning reviewed and normal Left chest without hematoma CXR with leads in stable position. No ptx  2.  Paroxysmal atrial fibrillation Continue Xarelto for CHADS2VASC of 4 Continue Flecainide and Metoprolol for rhythm control  Routine device follow up entered in AVS. Ok to discharge from EP standpoint.  Electrophysiology team to see as needed while here. Please call with questions.  Chanetta Marshall, NP 05/29/2016 7:28 AM  I have seen and examined this patient with Chanetta Marshall.  Agree with above, note added to reflect my findings.  On exam, regular rhythm, no murmurs, lungs clear. Had dual chamber pacemaker placed for tachy brady syndrome.  CXR without abnormalities.  Interrogation without issues.  Plan for continued metoprolol, flecainide, and Xarelto for atrial fibrillation.  OK to discharge from EP standpoint at this time.      Madisyn Mawhinney M. Kalev Temme MD 05/29/2016 7:51 AM

## 2016-05-29 NOTE — Care Management Note (Signed)
Case Management Note  Patient Details  Name: Andrew Norton MRN: [id number] Date of Birth: [birth year]  Subjective/Objective:   Patient transferred from [facility] , he is s/p pacemaker implant, he was set up with [facility] already for [facility], and HHPT, will need Orders.  Patient is also on xarelto which  Is new for him, NCM awaiting benefit check.     Co pay is 97.89, NCM gave wife 30 day saving card,informed her if this is too much for them to let MD know and he may be able to change him to something else.  Wife states they will go to [facility] on [address] to get the 30 day free xarelto, and they do have in stock.   NCM will cont to follow for dc needs  11/14 - per MD patient will need to go to snf, he is very weak , pt eval today rec snf , CSW referral.  Patient wife prefers [facility].  She likes [facility] and [facility] but prefers [facility]. Lorriane Shire will be over to speak with patient shortly.                   Action/Plan:   Expected Discharge Date:   (unknown)               Expected Discharge Plan:  Skilled Nursing Facility  In-House Referral:  Clinical Social Work  Discharge planning Services  CM Consult  Post Acute Care Choice:    Choice offered to:     DME Arranged:    DME Agency:     HH Arranged:    Center Agency:     Status of Service:  Completed, signed off  If discussed at [employer], dates discussed:    Additional Comments:  Zenon Mayo, RN 05/29/2016, 3:34 PM

## 2016-05-29 NOTE — Progress Notes (Signed)
Per benefit check  XARELTO 20 MG DAILY    COVER- YES  CO-PAY- $ 97.89  PRIOR APPROVAL -NO  PHARMACY : [facility]

## 2016-05-29 NOTE — Care Management Important Message (Signed)
Important Message  Patient Details  Name: Andrew Norton MRN: [id number] Date of Birth: [birth year]   Medicare Important Message Given:  Yes    Becky Berberian Montine Circle 05/29/2016, 10:47 AM

## 2016-05-29 NOTE — Progress Notes (Addendum)
Physical Therapy Treatment Patient Details Name: Andrew Norton MRN: [id number] DOB: 09-30-1926 Today's Date: 05/29/2016    History of Present Illness 82 y.o. male past medical history of essential hypertension, non-insulin-dependent diabetes mellitus, neuropathy with gait instability and presented to hospital with new onset jaundice with an abdominal CT scan showed intrahepatic and biliary ductal dilation, s/p ERCP 05/22/16    PT Comments    Progressing steady with mobility, but quickly fatigued with there ex, then toileting and finishing with ambulation using cane.  Pt can best be served by RW at this time. Pt would benefit by more therapy at SNF level before home with his wife.   Follow Up Recommendations  SNF;Other (comment) (pt significantly weak and might be too much for wife to manage)     Equipment Recommendations  None recommended by PT    Recommendations for Other Services       Precautions / Restrictions Precautions Precautions: Fall;ICD/Pacemaker Restrictions LUE Weight Bearing: Non weight bearing    Mobility  Bed Mobility Overal bed mobility: Needs Assistance Bed Mobility: Sit to Supine       Sit to supine: Min assist   General bed mobility comments: min to assist legs in.  Once in pt bridged to the appropriate position  Transfers Overall transfer level: Needs assistance Equipment used: Straight cane Transfers: Sit to/from Stand Sit to Stand: Min assist         General transfer comment: cues for hand placement and minimal lift assist  Ambulation/Gait Ambulation/Gait assistance: Min assist;Mod assist Ambulation Distance (Feet): 75 Feet Assistive device: Straight cane Gait Pattern/deviations: Step-through pattern   Gait velocity interpretation: Below normal speed for age/gender General Gait Details: pt became fatigued enough that he was unstable with a cane and unable to hold or correct for baseline posture.   Stairs            Wheelchair  Mobility    Modified Rankin (Stroke Patients Only)       Balance Overall balance assessment: Needs assistance   Sitting balance-Leahy Scale: Fair     Standing balance support: Bilateral upper extremity supported;Single extremity supported Standing balance-Leahy Scale: Poor Standing balance comment: reliant during pericare/hygienge (for diarrhea )  on external support                     Cognition Arousal/Alertness: Awake/alert Behavior During Therapy: WFL for tasks assessed/performed Overall Cognitive Status: Within Functional Limits for tasks assessed                      Exercises General Exercises - Lower Extremity Long Arc Quad: AROM;Strengthening;Both;10 reps;Seated Hip Flexion/Marching: AROM;Strengthening;Both;10 reps;Seated Toe Raises: AROM;Strengthening;Both;20 reps;Seated Heel Raises: AROM;Strengthening;Both;20 reps;Seated Other Exercises Other Exercises: bicep/triceps bil x10 reps, isolated humeral ROM to follow PPM precautions.    General Comments        Pertinent Vitals/Pain Pain Assessment: Faces Faces Pain Scale: No hurt    Home Living                      Prior Function            PT Goals (current goals can now be found in the care plan section) Acute Rehab PT Goals Patient Stated Goal: home as soon as I can PT Goal Formulation: With patient Time For Goal Achievement: 05/30/16 Potential to Achieve Goals: Good Progress towards PT goals: Progressing toward goals    Frequency    Min 3X/week  PT Plan Discharge plan needs to be updated    Co-evaluation             End of Session   Activity Tolerance: Patient tolerated treatment well;Patient limited by fatigue Patient left: in bed;with call bell/phone within reach;with family/visitor present     Time: 1258-1330 PT Time Calculation (min) (ACUTE ONLY): 32 min  Charges:  $Gait Training: 8-22 mins $Therapeutic Exercise: 8-22 mins                     G CodesTessie Fass Joneisha Miles 05/29/2016, 2:44 PM  05/29/2016  Donnella Sham, [place] [phone]  (pager)

## 2016-05-29 NOTE — Consult Note (Signed)
[facility] CM Primary Care Navigator  05/29/2016  ADIEN KIMMEL [birth year] [phone]   Met with patient and wife [name]) as well as his two sons at the bedside to identify possible discharge needs.  Patient was dosing on and off during the conversation.  Wife and son reported that patient had yellowish skin discoloration, complained of having no energy and claimed "not feeling good" which had led to this admission/ surgery.  Patient's wife mentioned Dr.Stephen Sarajane Jews with [address] [place]) as the primary care provider.   Patient states using [facility] to obtain medications without any problem. She reports that patient was managing his own medications at home using "pill box" system weekly.   Patient drives prior to this admission/ surgery. Wife will be providing transportation to his doctors' appointments as stated.  Patient is independent with self care before this admission. Wife will serve asthe primary caregiver when patient is discharged home. According to wife, plan for discharge is skilled nursing facility for short term rehabilitation prior to going back home.  Patient and wife expressed understanding to call primary care provider's office once discharged home, for a post discharge follow-up appointment within a week or sooner if needed. Patient letter provided as a reminder.  Wife denies any needs for disease management/ education and care coordination at this time.   For additional questions please contact:  Edwena Felty A. Amonte [place], BSN, RN-BC [facility] PRIMARY CARE Navigator Cell: [phone]

## 2016-05-29 NOTE — NC FL2 (Signed)
[place] LEVEL OF CARE SCREENING TOOL     IDENTIFICATION  Patient Name: Andrew Norton Birthdate: Mar 28, 1927 Sex: male Admission Date (Current Location): 05/17/2016  [facility] and [place] Number:  Herbalist and Address:  [facility] [place]. [facility], [place] [address] [place], [place] [address]      Provider Number: [id number]  Attending Physician Name and Address:  Charlynne Cousins, MD  Relative Name and Phone Number:  Thalmus Belotti, wife.  [phone] (mobile)    Current Level of Care: Hospital Recommended Level of Care: [place] Prior Approval Number:    Date Approved/Denied:   PASRR Number: [id number] (Eff. 05/21/08)  Discharge Plan: SNF    Current Diagnoses: Patient Active Problem List   Diagnosis Date Noted  . Tachy-brady syndrome ([place]) 05/21/2016  . Sinus pause 05/21/2016  . NSVT (nonsustained ventricular tachycardia) ([place]) 05/21/2016  . Bile duct obstruction   . Abnormal CT of the abdomen   . Paroxysmal atrial fibrillation ([place]) 05/18/2016  . Protein-calorie malnutrition, severe 05/18/2016  . Jaundice 05/17/2016  . Diabetes mellitus type 2, noninsulin dependent ([place]) 05/17/2016  . LFT elevation 05/17/2016  . Leukocytosis 05/17/2016  . 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 Constipation 07/18/2015  . Aspiration pneumonia ([place]) 04/12/2015  . Sepsis ([place]) 04/12/2015  . Hyperlipidemia 04/12/2015  . Diabetes mellitus type 2 with complications ([place]) 123456  . Lobar pneumonia due to unspecified organism 04/12/2015  . Cervical myelopathy ([place]) 11/10/2013  . Cervical disc disorder with radiculopathy of cervical region 07/01/2013  . Abnormality of gait 07/01/2013  . Unspecified arthropathy, pelvic region and thigh 11/27/2012  . Type II or unspecified type diabetes mellitus with neurological manifestations, not stated as uncontrolled(250.60) 11/27/2012  . Unspecified hereditary and idiopathic peripheral neuropathy  11/27/2012  . Acute posthemorrhagic anemia 11/27/2012  . Pain with hip hemiarthroplasty ([place]) 11/05/2012  . HYPERKALEMIA 10/26/2009  . Vitamin B12 deficiency 09/27/2009  . CARPAL TUNNEL SYNDROME 09/27/2009  . 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 CHEST PAIN 12/01/2008  . NECK PAIN, CHRONIC 07/01/2008  . METHYLMALONIC ACIDEMIA 02/09/2008  . ANXIETY STATE, UNSPECIFIED 12/29/2007  . LEG CRAMPS, NOCTURNAL 12/29/2007  . SHINGLES 12/15/2007  . HEARING LOSS 08/11/2007  . DYSFUNCTION OF EUSTACHIAN TUBE 08/04/2007  . BACK PAIN, LUMBAR 07/30/2007  . Essential hypertension 07/21/2007  . ACUTE BRONCHITIS 07/21/2007  . HYPERLIPIDEMIA 05/12/2007  . Disorder resulting from impaired renal function 05/12/2007  . ANEMIA-NOS 12/11/2006  . HAY FEVER 12/11/2006  . PAGET'S DISEASE 12/11/2006  . TB SKIN TEST, POSITIVE 12/11/2006    Orientation RESPIRATION BLADDER Height & Weight     Self, Time, Situation, Place  Normal Continent (Urinary catheter placed 11/12) Weight: 157 lb 13.6 oz (71.6 kg) Height:  5\' 10"  (177.8 cm)  BEHAVIORAL SYMPTOMS/MOOD NEUROLOGICAL BOWEL NUTRITION STATUS      Continent Diet (Heart healthy-carb modified)  AMBULATORY STATUS COMMUNICATION OF NEEDS Skin   Extensive Assist (PT note indicates min/mod assist) Verbally Other (Comment) (Ecchymosis-bilateral arms and moisture associated skin damage to buttocks with barrier cream applied)                       Personal Care Assistance Level of Assistance  Bathing, Feeding, Dressing Bathing Assistance: Limited assistance Feeding assistance: Independent Dressing Assistance: Limited assistance     Functional Limitations Info  Sight, Hearing, Speech Sight Info: Impaired (Wears glasses) Hearing Info: Adequate Speech Info: Adequate    SPECIAL CARE FACTORS FREQUENCY  PT (By licensed  PT), OT (By licensed OT)     PT Frequency: Evaluated 11/8 and a minimum of 3X per week therapy recommended               Contractures Contractures Info: Not present    Additional Factors Info  Code Status, Allergies, Insulin Sliding Scale Code Status Info: Full Allergies Info: Penicillin   Insulin Sliding Scale Info: 0-5 units insulin daily at bedtime; 0-15 units insulin 3X per day with meals       Current Medications (05/29/2016):  This is the current hospital active medication list Current Facility-Administered Medications  Medication Dose Route Frequency Provider Last Rate Last Dose  . acetaminophen (TYLENOL) tablet 325-650 mg  325-650 mg Oral Q4H PRN Will Meredith Leeds, MD      . ciprofloxacin (CIPRO) IVPB 400 mg  400 mg Intravenous BID Charlynne Cousins, MD   400 mg at 05/29/16 0815  . cyclobenzaprine (FLEXERIL) tablet 10 mg  10 mg Oral QHS PRN Florencia Reasons, MD      . feeding supplement (GLUCERNA SHAKE) (GLUCERNA SHAKE) liquid 237 mL  237 mL Oral BID BM Charlynne Cousins, MD   237 mL at 05/29/16 1500  . finasteride (PROSCAR) tablet 5 mg  5 mg Oral Daily Florencia Reasons, MD   5 mg at 05/29/16 1048  . flecainide (TAMBOCOR) tablet 50 mg  50 mg Oral Q12H Thompson Grayer, MD   50 mg at 05/29/16 1030  . gabapentin (NEURONTIN) capsule 400 mg  400 mg Oral QID Eileen Stanford, PA-C   400 mg at 05/29/16 1500  . hydrALAZINE (APRESOLINE) injection 10 mg  10 mg Intravenous Q6H PRN Florencia Reasons, MD      . HYDROcodone-acetaminophen (NORCO/VICODIN) 5-325 MG per tablet 1 tablet  1 tablet Oral Q6H PRN Florencia Reasons, MD   1 tablet at 05/18/16 1546  . insulin aspart (novoLOG) injection 0-15 Units  0-15 Units Subcutaneous TID WC Charlynne Cousins, MD   8 Units at 05/29/16 1210  . insulin aspart (novoLOG) injection 0-5 Units  0-5 Units Subcutaneous QHS Charlynne Cousins, MD   5 Units at 05/27/16 2141  . insulin aspart (novoLOG) injection 3 Units  3 Units Subcutaneous TID WC Charlynne Cousins, MD   3 Units at 05/29/16 1210  . insulin glargine (LANTUS) injection 25 Units  25 Units Subcutaneous BID Charlynne Cousins, MD   25 Units at  05/29/16 1030  . lidocaine (LIDODERM) 5 % 1 patch  1 patch Transdermal Q24H Florencia Reasons, MD   1 patch at 05/29/16 0830  . losartan (COZAAR) tablet 25 mg  25 mg Oral Daily Isaiah Serge, NP   25 mg at 05/29/16 1049  . magnesium oxide (MAG-OX) tablet 400 mg  400 mg Oral Daily Dayna N Dunn, PA-C   400 mg at 05/29/16 1048  . metoprolol tartrate (LOPRESSOR) tablet 12.5 mg  12.5 mg Oral BID Dorothy Spark, MD   12.5 mg at 05/29/16 1048  . metroNIDAZOLE (FLAGYL) tablet 500 mg  500 mg Oral [address] [place], [place]   500 mg at 05/29/16 [id number]  . ondansetron (ZOFRAN) injection 4 mg  4 mg Intravenous Q6H PRN Will Meredith Leeds, MD      . polyethylene glycol (MIRALAX / GLYCOLAX) packet 17 g  17 g Oral BID Lavone Nian [place], [place]   17 g at 05/27/16 1019  . rivaroxaban (XARELTO) tablet 20 mg  20 mg Oral Q supper Thompson Grayer, MD   20 mg  at 05/27/16 1746  . senna-docusate (Senokot-S) tablet 1 tablet  1 tablet Oral BID Florencia Reasons, MD   1 tablet at 05/28/16 1139  . sodium chloride flush (NS) 0.9 % injection 3 mL  3 mL Intravenous Q12H Florencia Reasons, MD   3 mL at 05/29/16 1052     Discharge Medications: Please see discharge summary for a list of discharge medications.  Relevant Imaging Results:  Relevant Lab Results:   Additional Information ss#[id number].  Patient has permanent pacemaker.  Sable Feil, LCSW

## 2016-05-29 NOTE — Care Management Note (Addendum)
Case Management Note  Patient Details  Name: Andrew Norton MRN: [id number] Date of Birth: 1927/04/15  Subjective/Objective:   Patient transferred from [facility] , he is s/p pacemaker implant, he was set up with [facility] already for [facility], and HHPT, will need Orders.  Patient is also on xarelto which  Is new for him, NCM awaiting benefit check.     Co pay is 97.89, NCM gave wife 30 day saving card,informed her if this is too much for them to let MD know and he may be able to change him to something else.  Wife states they will go to [facility] on [address] to get the 30 day free xarelto, and they do have in stock.   NCM will cont to follow for dc needs.                Action/Plan:   Expected Discharge Date:   (unknown)               Expected Discharge Plan:  [place]  In-House Referral:     Discharge planning Services  CM Consult  Post Acute Care Choice:  Home Health Choice offered to:  Patient  DME Arranged:    DME Agency:     HH Arranged:  RN, PT [place] Agency:  [place]  Status of Service:  Completed, signed off  If discussed at [place] of Stay Meetings, dates discussed:    Additional Comments:  Zenon Mayo, RN 05/29/2016, 11:18 AM

## 2016-05-29 NOTE — Discharge Summary (Addendum)
Physician Discharge Summary  JOHNATHAN MOUND [id number] DOB: 1927-07-07 DOA: 05/17/2016  PCP: Laurey Morale, MD  Admit date: 05/17/2016 Discharge date: 05/30/2016  Time spent: 90 minutes minutes  Recommendations for Outpatient Follow-up:   Ampullary lesion due to 1.2 cm mm Bile duct obstruction: -ERCP was done that showed biliary stone status post sphincterotomy and balloon distraction. Distal bile duct dilatation biopsies were done. Stent is not placed. -Biopsy results was negative for malignancy. -Biliary stone removed -Completed course of Ciprofloxacin + Flagyl . Will follow-up with PCP  Tachy/brady syndrome -S/p PPM. Patient extremely weak and frail - would benefit from outpatient sleep study   Paroxysmal atrial fibrillation( CHADS2VASC of 4) -Currently paced rhythm. -Xarelto  -Flecainide  -Metoprolol   NSVT (nonsustained ventricular tachycardia) (HCC) -Permanent pacer in place resolved  Essential hypertension: Continue hold Cozaar and diltiazem, blood pressure seems to be stable  Leukocytosis: -Afebrile, now resolved. -Discontinue antibiotics upon discharge   HYPERKALEMIA -Resolved.  Diabetes mellitus type 2, noninsulin dependent ([place]) -8/23 Hemoglobin A1c = 7.6  -Restart patient's home medication -Scheduled follow-up with Dr. Alysia Penna for mild obstruction, with ampullary lesion. Diabetes type 2 uncontrolled with, complications - will discharge on insulin  Protein-calorie malnutrition, severe -Discharged SNF -Glucerna BID -Heart healthy/American diabetic Association diet  Chronic constipation: Continue stool softener.    Discharge Diagnoses:  Principal Problem:   Bile duct obstruction Active Problems:   HYPERKALEMIA   Hyperlipidemia   Constipation   Jaundice   Diabetes mellitus type 2, noninsulin dependent (HCC)   LFT elevation   Leukocytosis   Ampulla of Vater mass   Paroxysmal atrial fibrillation (HCC)   Protein-calorie malnutrition,  severe   Abnormal CT of the abdomen   Tachy-brady syndrome (HCC)   Sinus pause   NSVT (nonsustained ventricular tachycardia) (HCC)   Gallbladder & bile duct stone with obstruction   Discharge Condition: Stable  Diet recommendation: Heart healthy/American Diabetic Association  Filed Weights   05/22/16 1158 05/29/16 0425 05/30/16 0334  Weight: 69.9 kg (154 lb) 71.6 kg (157 lb 13.6 oz) 71.3 kg (157 lb 3 oz)    History of present illness:  NEMIAH SCALLION is an 82 y.o.WM PMHx essential hypertension, diabetes type 2 uncontrolled with complication, neuropathy with gait instability they came into the hospital with new onset jaundice with an abdominal CT scan showed intrahepatic and biliary ductal dilation. ERCP done and stone removed, biopsy negative for malignancy. Started having pauses on telemetry. Cardiology consulted and started flecainide, and placed pacer.   Procedures: 11/7 ERCP/Ampulla of Vater, biopsy, distal bile duct; ULCERATION AND EROSION WITH ASSOCIATED REACTIVE CHANGES. Negative malignancy  11/13 permanent pacemaker placement   Consultations: Dr.Will Meredith Leeds EP Dr.Daniel Merrily [facility]   Antibiotics Ciprofloxacin 11/2>> 11/15 Flagyl 11/2>> 11/15    Discharge Exam: Vitals:   05/29/16 2000 05/29/16 2025 05/30/16 0334 05/30/16 0801  BP:  139/90 (!) 144/67 (!) 181/57  Pulse:  60 60 60  Resp: 20 20 15 13   Temp:  97.8 F (36.6 C) 97.4 F (36.3 C) 97.7 F (36.5 C)  TempSrc:  Oral Oral Oral  SpO2:  99% 100% 100%  Weight:   71.3 kg (157 lb 3 oz)   Height:        General: A/O 4, extremely weak and frail, Cardiovascular: Regular rhythm and rate (paced), negative murmurs rubs gallops, normal S1/S2 Respiratory: Clear to auscultation bilateral   Discharge Instructions     Medication List    TAKE these medications   aspirin 81 MG  chewable tablet Chew 1 tablet (81 mg total) by mouth daily.   cyanocobalamin 1000 MCG/ML injection Commonly known as:   (VITAMIN B-12) Inject 1,000 mcg into the muscle every 30 (thirty) days.   cyclobenzaprine 10 MG tablet Commonly known as:  FLEXERIL TAKE 1 TABLET 3 TIMES DAILYAS NEEDED FOR MUSCLE SPASMS What changed:  how much to take  how to take this  when to take this  reasons to take this  additional instructions   Ferrous Sulfate 220 (44 Fe) MG/5ML Liqd TAKE 7 MLS BY MOUTH DAILY   finasteride 5 MG tablet Commonly known as:  PROSCAR TAKE 1 TABLET DAILY What changed:  See the new instructions.   flecainide 50 MG tablet Commonly known as:  TAMBOCOR Take 1 tablet (50 mg total) by mouth every 12 (twelve) hours.   gabapentin 400 MG capsule Commonly known as:  NEURONTIN TAKE 1 CAPSULE 4 TIMES     DAILY   glipiZIDE 10 MG tablet Commonly known as:  GLUCOTROL TAKE 1 TABLET TWICE A DAY  BEFORE MEALS   HYDROcodone-acetaminophen 5-325 MG tablet Commonly known as:  NORCO/VICODIN Take 1 tablet by mouth every 6 (six) hours as needed for moderate pain.   insulin glargine 100 UNIT/ML injection Commonly known as:  LANTUS Inject 0.2 mLs (20 Units total) into the skin 2 (two) times daily.   lidocaine 5 % Commonly known as:  LIDODERM Place 1 patch onto the skin daily. Remove & Discard patch within 12 hours or as directed by MD   losartan 25 MG tablet Commonly known as:  COZAAR Take 1 tablet (25 mg total) by mouth daily. What changed:  See the new instructions.   metFORMIN 1000 MG tablet Commonly known as:  GLUCOPHAGE TAKE 1 TABLET TWICE A DAY  WITH MEALS   metoprolol tartrate 25 MG tablet Commonly known as:  LOPRESSOR Take 0.5 tablets (12.5 mg total) by mouth 2 (two) times daily.   polyethylene glycol powder powder Commonly known as:  GLYCOLAX/MIRALAX MIX 1 CAPFUL IN LIQUID OF CHOICE AND DRINK ONCE DAILY AS DIRECTED   rivaroxaban 20 MG Tabs tablet Commonly known as:  XARELTO Take 1 tablet (20 mg total) by mouth daily with supper.   sitaGLIPtin 100 MG tablet Commonly known as:   JANUVIA Take 1 tablet (100 mg total) by mouth daily.       Follow-up Information    [facility] Follow up on 06/11/2016.   Specialty:  Cardiology Why:  at 12noon for wound check  Contact information: [address] [place] [phone]       Will Meredith Leeds, MD Follow up on 08/31/2016.   Specialty:  Cardiology Why:  at 10:30AM Contact information: [place] [address] [phone]        Laurey Morale, MD. Schedule an appointment as soon as possible for a visit in 1 week(s).   Specialty:  Family Medicine Why:  Scheduled follow-up with Dr. Alysia Penna for mild obstruction, with ampullary lesion. Diabetes type 2 uncontrolled with, complications Contact information: [place] [address] [phone]            The results of significant diagnostics from this hospitalization (including imaging, microbiology, ancillary and laboratory) are listed below for reference.    Significant Diagnostic Studies: Dg Chest 2 View  Result Date: 05/29/2016 CLINICAL DATA:  82 year old male. Cardiac device. Initial encounter. EXAM: CHEST  2 VIEW COMPARISON:  04/13/2015  FINDINGS: Sequential pacemaker enters from the left with leads in the region of the right atrium (lateral wall region) and right ventricle. Cardiomegaly. Central pulmonary vascular prominence.  No frank pulmonary edema. Biapical pleural thickening without associated bony destruction similar to prior exams. No pneumothorax. Calcified slightly tortuous aorta. Postsurgical changes cervical spine. Right acromioclavicular joint degenerative changes. No plain film evidence of pulmonary malignancy. IMPRESSION: Sequential pacemaker enters from the left with leads in the region of the right atrium and right ventricle. Cardiomegaly. Central pulmonary vascular prominence.  No frank pulmonary edema. Electronically Signed   By:  Genia Del M.D.   On: 05/29/2016 07:21   US Abdomen Complete  Result Date: 05/23/2016 CLINICAL DATA:  Recent common bile duct calculus EXAM: ABDOMEN ULTRASOUND COMPLETE COMPARISON:  ERCP May 22, 2016. FINDINGS: Gallbladder: No gallstones are evident. The gallbladder wall is thickened and edematous. There is no pericholecystic fluid. No sonographic Murphy sign noted by sonographer. Common bile duct: Diameter: 13 mm, dilated. There is moderate edema in the common hepatic and common bile duct. No mass or calculus is seen by ultrasound currently in the biliary ductal system. A small amount of air in the intrahepatic ducts may be due to the recent common bile duct manipulation. The intrahepatic biliary ducts do not appear appreciably dilated by ultrasound. Liver: No focal lesion identified. Liver echogenicity is overall increased. IVC: No abnormality visualized. Pancreas: Visualized portion unremarkable. Portions of the pancreatic tail are obscured by gas. Spleen: Size and appearance within normal limits. Right Kidney: Length: 12.6 cm. Echogenicity within normal limits. No mass or hydronephrosis visualized. There is renal cortical thinning. Left Kidney: Length: 11.6 cm. Echogenicity within normal limits. No mass or hydronephrosis visualized. There is renal cortical thinning. Abdominal aorta: No aneurysm visualized. Other findings: No demonstrable ascites. IMPRESSION: Gallbladder wall is thickened and edematous. No gallstones seen. A degree of acalculus cholecystitis must be of concern. Common bile duct remains dilated without mass or calculus evident by ultrasound. There is edema in the wall of the common bile duct. A small amount of air in the intrahepatic biliary ductal system is likely secondary to the recent biliary ductal manipulation. Portions of pancreas obscured by gas. Visualized portions of pancreas appear normal. Renal cortical thinning bilaterally may be a function of age or may be seen with  medical renal disease. Kidneys otherwise appear unremarkable. Study otherwise unremarkable. Electronically Signed   By: Lowella Grip III M.D.   On: 05/23/2016 15:19   Ct Abdomen Pelvis W Contrast  Result Date: 05/11/2016 CLINICAL DATA:  Jaundice for about 2 weeks, abdominal swelling EXAM: CT ABDOMEN AND PELVIS WITH CONTRAST TECHNIQUE: Multidetector CT imaging of the abdomen and pelvis was performed using the standard protocol following bolus administration of intravenous contrast. CONTRAST:  163mL ISOVUE-300 IOPAMIDOL (ISOVUE-300) INJECTION 61% COMPARISON:  None. FINDINGS: Lower chest: Lung bases shows mild peripheral interstitial prominence probable probable chronic in nature. No segmental infiltrate. Hepatobiliary: There is intrahepatic biliary ductal dilatation. No focal hepatic mass. No calcified gallstones are noted within gallbladder. There is significant CBD dilatation up to 1.8 cm. There is abrupt narrowing of CBD at the level of ampulla There is a polypoid lesion at the level of ampulla within duodenum measures about 1.5 cm axial image 47. Ampullary mass cannot be excluded. Further correlation with ERCP is recommended. Pancreas: There is fatty replaced pancreas. No focal pancreatic mass. No dilatation of pancreatic duct. Spleen: Enhanced spleen is unremarkable. Adrenals/Urinary Tract: No adrenal gland mass. Mild for renal cortical thinning and lobulated  contour probable due to atrophy. Bilateral renal symmetrical enhancement. Delayed renal images shows bilateral renal symmetrical excretion. Bilateral visualized proximal ureter is unremarkable. Stomach/Bowel: There is no small bowel obstruction. Oral contrast material was given to the patient. Some liquid stool noted in right colon. No pericecal inflammation. The terminal ileum is unremarkable. Liquid stool noted in transverse colon and descending colon. No evidence of obstructing mass. Moderate stool noted within redundant sigmoid colon. Moderate  stool noted within rectum. No definite colonic obstruction. No colitis or acute diverticulitis. Vascular/Lymphatic: Mild atherosclerotic calcifications of distal abdominal aorta. No aortic aneurysm. No retroperitoneal or mesenteric adenopathy. Reproductive: Prostate gland is unremarkable. No bladder filling defects are noted. There are metallic artifacts from left hip prosthesis. Other: No ascites or free abdominal air.  No inguinal adenopathy. Musculoskeletal: No destructive bony lesions are noted. Sagittal images of the spine shows diffuse osteopenia. Degenerative changes lumbar spine. There is mild to moderate compression deformity L1 vertebral body of indeterminate age. Clinical correlation is necessary. IMPRESSION: 1. There is intrahepatic biliary ductal dilatation. No focal hepatic mass. No calcified gallstones are noted within gallbladder. There is significant CBD dilatation up to 1.8 cm. There is abrupt narrowing of CBD at the level of ampulla There is a polypoid lesion at the level of ampulla within duodenum measures about 1.5 cm axial image 47. Ampullary mass cannot be excluded. Further correlation with ERCP is recommended. 2. Moderate colonic stool in distal colon. No definite evidence of acute colitis or diverticulitis. 3. No small bowel obstruction. 4. Metallic artifacts from left hip prosthesis. Compression deformity L1 vertebral body of indeterminate age. Clinical correlation is necessary. Electronically Signed   By: Lahoma Crocker M.D.   On: 05/11/2016 16:58   Dg Ercp Biliary & Pancreatic Ducts  Result Date: 05/22/2016 CLINICAL DATA:  Gallstone EXAM: ERCP TECHNIQUE: Multiple spot images obtained with the fluoroscopic device and submitted for interpretation post-procedure. FLUOROSCOPY TIME:  Fluoroscopy Time:  5 minutes and 12 seconds Radiation Exposure Index (if provided by the fluoroscopic device): 87 mGy Number of Acquired Spot Images: 6 COMPARISON:  None. FINDINGS: The common bile duct is markedly  dilated. There is a filling defect in the distal common bile duct. Contrast also fills the pancreatic duct. IMPRESSION: See above. These images were submitted for radiologic interpretation only. Please see the procedural report for the amount of contrast and the fluoroscopy time utilized. Electronically Signed   By: Marybelle Killings M.D.   On: 05/22/2016 16:03    Microbiology: No results found for this or any previous visit (from the past 240 hour(s)).   Labs: Basic Metabolic Panel:  Recent Labs Lab 05/25/16 0330 05/26/16 0357 05/26/16 0358 05/27/16 0540 05/30/16 0254  NA 131*  --  134* 132* 130*  K 3.3*  --  4.5 4.7 4.1  CL 98*  --  99* 99* 99*  CO2 24  --  27 27 24   GLUCOSE 216*  --  268* 206* 65  BUN 29*  --  29* 26* 23*  CREATININE 0.76  --  0.80 0.78 0.93  CALCIUM 7.8*  --  8.7* 8.7* 8.4*  MG  --  2.0  --   --  1.9   Liver Function Tests:  Recent Labs Lab 05/24/16 0521 05/25/16 0330 05/26/16 0358 05/27/16 0540 05/30/16 0254  AST 232* 118* 112* 90* 119*  ALT 153* 125* 121* 110* 88*  ALKPHOS 626* 598* 668* 666* 708*  BILITOT 6.5* 4.2* 4.1* 3.6* 3.0*  PROT 5.7* 5.9* 6.4* 6.2* 5.7*  ALBUMIN 2.6* 2.6* 3.0* 2.9* 2.5*   No results for input(s): LIPASE, AMYLASE in the last 168 hours. No results for input(s): AMMONIA in the last 168 hours. CBC:  Recent Labs Lab 05/25/16 0330 05/26/16 0358 05/27/16 0540 05/28/16 0420 05/30/16 0254  WBC 9.7 9.9 8.7 10.6* 8.4  HGB 8.1* 8.6* 8.2* 8.7* 8.3*  HCT 22.8* 24.5* 23.5* 24.6* 24.1*  MCV 88.7 90.1 91.4 91.8 90.9  PLT 261 274 221 193 96*   Cardiac Enzymes: No results for input(s): CKTOTAL, CKMB, CKMBINDEX, TROPONINI in the last 168 hours. BNP: BNP (last 3 results) No results for input(s): BNP in the last 8760 hours.  ProBNP (last 3 results) No results for input(s): PROBNP in the last 8760 hours.  CBG:  Recent Labs Lab 05/29/16 1725 05/29/16 2053 05/30/16 0605 05/30/16 0637 05/30/16 1230  GLUCAP 227* 147* 41* 75  124*       Signed:  Dia Crawford, MD [facility] [phone] pager

## 2016-05-29 NOTE — Telephone Encounter (Signed)
I spoke to the wife and she wanted Dr. Sarajane Jews to know he has been admitted to the hospital for AFIB and they are putting in a pacemaker  SHAY 05-29-16

## 2016-05-29 NOTE — Clinical Social Work Note (Signed)
CSW received consult for SNF placement this afternoon. Completed assessment with patient and family and initiated facility search (full assessment to follow). Patient should discharge to SNF on Wednesday, 11/15.  Timisha Mondry Givens, MSW, LCSW Licensed Clinical Social Worker [place] [phone]

## 2016-05-30 DIAGNOSIS — I495 Sick sinus syndrome: Secondary | ICD-10-CM | POA: Diagnosis not present

## 2016-05-30 DIAGNOSIS — R278 Other lack of coordination: Secondary | ICD-10-CM | POA: Diagnosis not present

## 2016-05-30 DIAGNOSIS — K8071 Calculus of gallbladder and bile duct without cholecystitis with obstruction: Secondary | ICD-10-CM

## 2016-05-30 DIAGNOSIS — K831 Obstruction of bile duct: Secondary | ICD-10-CM | POA: Diagnosis not present

## 2016-05-30 DIAGNOSIS — E871 Hypo-osmolality and hyponatremia: Secondary | ICD-10-CM | POA: Diagnosis not present

## 2016-05-30 DIAGNOSIS — K5909 Other constipation: Secondary | ICD-10-CM | POA: Diagnosis not present

## 2016-05-30 DIAGNOSIS — E119 Type 2 diabetes mellitus without complications: Secondary | ICD-10-CM | POA: Diagnosis not present

## 2016-05-30 DIAGNOSIS — R1319 Other dysphagia: Secondary | ICD-10-CM | POA: Diagnosis not present

## 2016-05-30 DIAGNOSIS — R2689 Other abnormalities of gait and mobility: Secondary | ICD-10-CM | POA: Diagnosis not present

## 2016-05-30 DIAGNOSIS — K8063 Calculus of gallbladder and bile duct with acute cholecystitis with obstruction: Secondary | ICD-10-CM

## 2016-05-30 DIAGNOSIS — K839 Disease of biliary tract, unspecified: Secondary | ICD-10-CM | POA: Diagnosis not present

## 2016-05-30 DIAGNOSIS — E538 Deficiency of other specified B group vitamins: Secondary | ICD-10-CM | POA: Diagnosis not present

## 2016-05-30 DIAGNOSIS — Z794 Long term (current) use of insulin: Secondary | ICD-10-CM | POA: Diagnosis not present

## 2016-05-30 DIAGNOSIS — E118 Type 2 diabetes mellitus with unspecified complications: Secondary | ICD-10-CM | POA: Diagnosis not present

## 2016-05-30 DIAGNOSIS — E784 Other hyperlipidemia: Secondary | ICD-10-CM

## 2016-05-30 DIAGNOSIS — I482 Chronic atrial fibrillation: Secondary | ICD-10-CM | POA: Diagnosis not present

## 2016-05-30 DIAGNOSIS — R5381 Other malaise: Secondary | ICD-10-CM | POA: Diagnosis not present

## 2016-05-30 DIAGNOSIS — M545 Low back pain: Secondary | ICD-10-CM | POA: Diagnosis not present

## 2016-05-30 DIAGNOSIS — K8061 Calculus of gallbladder and bile duct with cholecystitis, unspecified, with obstruction: Secondary | ICD-10-CM | POA: Diagnosis not present

## 2016-05-30 DIAGNOSIS — K3189 Other diseases of stomach and duodenum: Secondary | ICD-10-CM | POA: Diagnosis not present

## 2016-05-30 DIAGNOSIS — I480 Paroxysmal atrial fibrillation: Secondary | ICD-10-CM | POA: Diagnosis not present

## 2016-05-30 DIAGNOSIS — D62 Acute posthemorrhagic anemia: Secondary | ICD-10-CM | POA: Diagnosis not present

## 2016-05-30 DIAGNOSIS — I10 Essential (primary) hypertension: Secondary | ICD-10-CM | POA: Diagnosis not present

## 2016-05-30 DIAGNOSIS — E43 Unspecified severe protein-calorie malnutrition: Secondary | ICD-10-CM

## 2016-05-30 DIAGNOSIS — M6281 Muscle weakness (generalized): Secondary | ICD-10-CM | POA: Diagnosis not present

## 2016-05-30 DIAGNOSIS — G629 Polyneuropathy, unspecified: Secondary | ICD-10-CM | POA: Diagnosis not present

## 2016-05-30 DIAGNOSIS — G8929 Other chronic pain: Secondary | ICD-10-CM | POA: Diagnosis not present

## 2016-05-30 DIAGNOSIS — R531 Weakness: Secondary | ICD-10-CM | POA: Diagnosis not present

## 2016-05-30 DIAGNOSIS — Z48815 Encounter for surgical aftercare following surgery on the digestive system: Secondary | ICD-10-CM | POA: Diagnosis not present

## 2016-05-30 DIAGNOSIS — E1140 Type 2 diabetes mellitus with diabetic neuropathy, unspecified: Secondary | ICD-10-CM | POA: Diagnosis not present

## 2016-05-30 LAB — COMPREHENSIVE METABOLIC PANEL
ALBUMIN: 2.5 g/dL — AB (ref 3.5–5.0)
ALK PHOS: 708 U/L — AB (ref 38–126)
ALT: 88 U/L — AB (ref 17–63)
AST: 119 U/L — AB (ref 15–41)
Anion gap: 7 (ref 5–15)
BILIRUBIN TOTAL: 3 mg/dL — AB (ref 0.3–1.2)
BUN: 23 mg/dL — AB (ref 6–20)
CALCIUM: 8.4 mg/dL — AB (ref 8.9–10.3)
CO2: 24 mmol/L (ref 22–32)
CREATININE: 0.93 mg/dL (ref 0.61–1.24)
Chloride: 99 mmol/L — ABNORMAL LOW (ref 101–111)
GFR calc Af Amer: >60 mL/min (ref >60)
GLUCOSE: 65 mg/dL (ref 65–99)
POTASSIUM: 4.1 mmol/L (ref 3.5–5.1)
Sodium: 130 mmol/L — ABNORMAL LOW (ref 135–145)
TOTAL PROTEIN: 5.7 g/dL — AB (ref 6.5–8.1)

## 2016-05-30 LAB — GLUCOSE, CAPILLARY
GLUCOSE-CAPILLARY: 41 mg/dL — AB (ref 65–99)
GLUCOSE-CAPILLARY: 75 mg/dL (ref 65–99)
GLUCOSE-CAPILLARY: 124 mg/dL — AB (ref 65–99)
GLUCOSE-CAPILLARY: 126 mg/dL — AB (ref 65–99)

## 2016-05-30 LAB — MAGNESIUM: MAGNESIUM: 1.9 mg/dL (ref 1.7–2.4)

## 2016-05-30 LAB — CBC
HCT: 24.1 % — ABNORMAL LOW (ref 39.0–52.0)
HEMOGLOBIN: 8.3 g/dL — AB (ref 13.0–17.0)
MCH: 31.3 pg (ref 26.0–34.0)
MCHC: 34.4 g/dL (ref 30.0–36.0)
MCV: 90.9 fL (ref 78.0–100.0)
Platelets: 96 10*3/uL — ABNORMAL LOW (ref 150–400)
RBC: 2.65 MIL/uL — ABNORMAL LOW (ref 4.22–5.81)
RDW: 15.9 % — AB (ref 11.5–15.5)
WBC: 8.4 10*3/uL (ref 4.0–10.5)

## 2016-05-30 MED ORDER — INSULIN GLARGINE 100 UNIT/ML ~~LOC~~ SOLN: 20.0000 [IU] | Freq: Two times a day (BID) | SUBCUTANEOUS | 11 refills | Status: DC

## 2016-05-30 NOTE — Progress Notes (Signed)
Dr. Adair Patter notified that patient still on flagyl po. Instructed to hold medication as the course of antibiotics has been completed.

## 2016-05-30 NOTE — Progress Notes (Signed)
Agree with Discharge summary dictated by Dr. Dia Crawford.  Did addend discharge summary to include outpatient sleep study follow up and decrease in insulin to 20units from 25 units.  No other changes to be made.  Awaiting SNF.

## 2016-05-30 NOTE — Clinical Social Work Placement (Signed)
   CLINICAL SOCIAL WORK PLACEMENT  NOTE 05/30/16 - DISCHARGED TO [facility]IA AMBULANCE  Date:  05/30/2016  Patient Details  Name: Andrew Norton MRN: [id number] Date of Birth: 02-11-1927  Clinical Social Work is seeking post-discharge placement for this patient at the [place] level of care (*CSW will initial, date and re-position this form in  chart as items are completed):  No (Wife provided CSW with facility preferences)   Patient/family provided with [place] Work Department's list of facilities offering this level of care within the geographic area requested by the patient (or if unable, by the patient's family).  Yes   Patient/family informed of their freedom to choose among providers that offer the needed level of care, that participate in Medicare, Medicaid or managed care program needed by the patient, have an available bed and are willing to accept the patient.  No   Patient/family informed of [facility]'s ownership interest in [facility] and [facility], as well as of the fact that they are under no obligation to receive care at these facilities.  PASRR submitted to EDS on       PASRR number received on       Existing PASRR number confirmed on 05/29/16     FL2 transmitted to all facilities in geographic area requested by pt/family on 05/29/16     FL2 transmitted to all facilities within larger geographic area on       Patient informed that his/her managed care company has contracts with or will negotiate with certain facilities, including the following:        Yes   Patient/family informed of bed offers received.  Patient chooses bed at [facility]     Physician recommends and patient chooses bed at      Patient to be transferred to [facility] on 05/30/16.  Patient to be transferred to facility by Ambulance [facility])     Patient family notified on 05/30/16 of transfer.  Name of family member notified:  Wife [name] at  the bedside     PHYSICIAN       Additional Comment:    _______________________________________________ Sable Feil, LCSW 05/30/2016, 4:44 PM

## 2016-05-30 NOTE — Progress Notes (Signed)
Physical Therapy Treatment Patient Details Name: DESAI APA MRN: [id number] DOB: 03-10-1927 Today's Date: 05/30/2016    History of Present Illness 82 y.o. male past medical history of essential hypertension, non-insulin-dependent diabetes mellitus, neuropathy with gait instability and presented to hospital with new onset jaundice with an abdominal CT scan showed intrahepatic and biliary ductal dilation, s/p ERCP 05/22/16    PT Comments    Pt shows definite signs of weakness and fatigue.  Emphasized transfer safety and gait stability today.  Follow Up Recommendations  SNF;Other (comment)     Equipment Recommendations  None recommended by PT    Recommendations for Other Services       Precautions / Restrictions Precautions Precautions: Fall;ICD/Pacemaker Restrictions LUE Weight Bearing: Non weight bearing    Mobility  Bed Mobility Overal bed mobility: Needs Assistance Bed Mobility: Sit to Supine       Sit to supine: Min assist   General bed mobility comments: min to assist legs in.  Once in pt bridged to the appropriate position  Transfers Overall transfer level: Needs assistance Equipment used: Rolling walker (2 wheeled) Transfers: Sit to/from Stand Sit to Stand: Min assist         General transfer comment: cues for hand placement and min lift assist and general stability due to patient leaning posteriorly  Ambulation/Gait Ambulation/Gait assistance: Min assist Ambulation Distance (Feet): 85 Feet Assistive device: Rolling walker (2 wheeled) Gait Pattern/deviations: Step-through pattern   Gait velocity interpretation: Below normal speed for age/gender General Gait Details: Noticeable fatigue during gait trial, but pt able to continue with generally steady pattern.  cues for posture   Stairs            Wheelchair Mobility    Modified Rankin (Stroke Patients Only)       Balance   Sitting-balance support: No upper extremity supported Sitting  balance-Leahy Scale: Fair       Standing balance-Leahy Scale: Poor Standing balance comment: reliant on external support                    Cognition Arousal/Alertness: Awake/alert Behavior During Therapy: WFL for tasks assessed/performed Overall Cognitive Status: Within Functional Limits for tasks assessed                      Exercises      General Comments        Pertinent Vitals/Pain Pain Assessment: Faces Faces Pain Scale: No hurt    Home Living                      Prior Function            PT Goals (current goals can now be found in the care plan section) Acute Rehab PT Goals Patient Stated Goal: home as soon as I can PT Goal Formulation: With patient Time For Goal Achievement: 05/30/16 Potential to Achieve Goals: Good Progress towards PT goals: Progressing toward goals    Frequency    Min 3X/week      PT Plan Current plan remains appropriate    Co-evaluation             End of Session   Activity Tolerance: Patient tolerated treatment well Patient left: in bed;with call bell/phone within reach;with family/visitor present     Time: [id number] PT Time Calculation (min) (ACUTE ONLY): 16 min  Charges:  $Gait Training: 8-22 mins  G CodesTessie Fass Skyelyn Scruggs 05/30/2016, 4:57 PM 05/30/2016  Donnella Sham, [place] [phone]  (pager)

## 2016-05-30 NOTE — Clinical Social Work Note (Signed)
Clinical Social Work Assessment  Patient Details  Name: Andrew Norton MRN: [id number] Date of Birth: 18-Nov-1926  Date of referral:  05/29/16               Reason for consult:  Facility Placement                Permission sought to share information with:  Family Supports Permission granted to share information::  Yes, Verbal Permission Granted (Wife and sons in room with patient)  Name::     Lanna Poche, Nicki Reaper and Cong Matheson  Agency::     Relationship::  Wife and sons  Sport and exercise psychologist Information:  [phone] (mobile wife) and [phone] (mobile son Nicki Reaper)  Housing/Transportation Living arrangements for the past 2 months:  [place] of Information:  Patient, Adult Children, Spouse Patient Interpreter Needed:  None Criminal Activity/Legal Involvement Pertinent to Current Situation/Hospitalization:  No - Comment as needed Significant Relationships:  Adult Children, Spouse Lives with:  Spouse Do you feel safe going back to the place where you live?  No (Patient and wife in agreement that rehab needed before returning home) Need for family participation in patient care:  Yes (Comment)  Care giving concerns:  Wife expressed that she cannot care for patient at discharge due to health issues and patient is very weak.  Social Worker assessment / plan:  CSW talked with patient/wife on 11/14 regarding discharge planning and recommendation of ST rehab. Patient was lying in bed, sitting up and was alert/oriented, pleasant and open to speaking with CSW. Wife was expecting to talk with a SW regarding her husband's discharge plan and welcomed CSW into the room. Also present were sons Annie Main and [place]. Patient and wife in agreement with ST rehab and [facility] is their first choice as patient has been there before and they were pleased with the care patient received.   Employment status:  Retired Forensic scientist:  Medicare, Other (Comment Required) (Chief of Staff) PT  Recommendations:  [place] / Referral to community resources:  Other (Comment Required) (Patient/wife had facility preference)  Patient/Family's Response to care:  No concerns were expressed regarding the care patient has received during hospitalization.  Patient/Family's Understanding of and Emotional Response to Diagnosis, Current Treatment, and Prognosis:  Not discussed.  Emotional Assessment Appearance:  Appears stated age Attitude/Demeanor/Rapport:  Other (Appropriate) Affect (typically observed):  Appropriate, Adaptable, Accepting, Pleasant Orientation:  Oriented to Self, Oriented to Place, Oriented to  Time, Oriented to Situation Alcohol / Substance use:  Tobacco Use, Alcohol Use, Illicit Drugs (Patient reported that he has never smoked and does not drink or use illicit drugs) Psych involvement (Current and /or in the community):  No (Comment)  Discharge Needs  Concerns to be addressed:  Discharge Planning Concerns Readmission within the last 30 days:  No Current discharge risk:  None Barriers to Discharge:  No Barriers Identified   Sable Feil, LCSW 05/30/2016, 4:23 PM

## 2016-05-30 NOTE — Telephone Encounter (Signed)
PER lorraine pc navigator callback number  [phone]  the pt will be discharge to skill nursing facility for short term rehab

## 2016-05-30 NOTE — Progress Notes (Signed)
Report given to Jory Sims LPN at [facility]. Report given to Verdie Shire, carelink transporter prior to transfer to [facility]. Reported patient given Xarelto before transfer and instructed to report to nurse.  Spoke to [place] at [facility] and notified that patient got blood thinner prior to transfer. She called and reported that patient's nurse was notified that blood thinner given at the hospital.

## 2016-05-30 NOTE — Progress Notes (Signed)
Nutrition Follow-up  DOCUMENTATION CODES:   Severe malnutrition in context of acute illness/injury  INTERVENTION:   -Continue Glucerna Shake po BID, each supplement provides 220 kcal and 10 grams of protein  NUTRITION DIAGNOSIS:   Increased nutrient needs related to catabolic illness (cholangitis) as evidenced by estimated needs.  Progressing  GOAL:   Patient will meet greater than or equal to 90% of their needs  Progressing  MONITOR:   PO intake, Supplement acceptance, Labs, Weight trends, I & O's  REASON FOR ASSESSMENT:   Malnutrition Screening Tool    ASSESSMENT:   82 y.o. male with history of HTN, noninsulin dependent dm2, chronic constipation, B12 deficiency, neuropathy with gait instability is sent from [facility] clinic for direct admission. Patient is referred to [facility] by pmd due to new onset of jaundice and abnormal CT ab/pel scan which showed intrahepatic biliary ductal dilatation.  Pt was transferred from [facility] to [facility] on 05/28/16 for pacemaker placement.   Spoke with pt and pt wife at bedside. Pt wife reports that she is very impressed about pt's increased appetite over the past several days. Meal completion 25-100%. Pt has also been consuming Glucerna shakes when offered.   Pt wife shares that pt was become weaker due to acute illness and is pleased that pt will transfer to SNF to regain strength. Discussed with pt and pt wife importance of continued good meal and supplement intake to assist with continued improvements in nutritional status and progression with therapy. Pt wife was also concerned about pt receiving a supplement at rehab. This RD will recommend continuation of supplement and made pt and wife aware that SNF may substitute with an equivalent supplement based on formulary.   Noted pt had a hypoglycemic episode this AM per nursing notes, which was corrected with a 15 gram carbohydrate snack.   Labs reviewed: CBGS: 41-227.   Diet Order:  Diet heart  healthy/carb modified Room service appropriate? Yes; Fluid consistency: Thin  Skin:  Reviewed, no issues  Last BM:  05/29/16  Height:   Ht Readings from Last 1 Encounters:  05/22/16 5\' 10"  (1.778 m)    Weight:   Wt Readings from Last 1 Encounters:  05/30/16 157 lb 3 oz (71.3 kg)    Ideal Body Weight:  75.45 kg  BMI:  Body mass index is 22.55 kg/m.  Estimated Nutritional Needs:   Kcal:  1800-2000 (MSJ x 1.3-1.5)  Protein:  90-105 grams (1.3-1.5 grams/kg)  Fluid:  >/= 1.7 L/day (25 ml/kg)  EDUCATION NEEDS:   Education needs addressed  Lacee Grey A. Jimmye Norman, RD, LDN, CDE Pager: [phone] After hours Pager: [phone]

## 2016-05-30 NOTE — Progress Notes (Signed)
Hypoglycemic Event  CBG: 41 (06:05 am) Treatment: 15 GM carbohydrate snack  Symptoms: Sweaty  Follow-up CBG: Time: 06:37 CBG Result: 75  Possible Reasons for Event: Meds  Comments/MD notified: NP Tylene Fantasia

## 2016-05-30 NOTE — Progress Notes (Signed)
Inpatient Diabetes Program Recommendations  AACE/ADA: New Consensus Statement on Inpatient Glycemic Control (2015)  Target Ranges:  Prepandial:   less than 140 mg/dL      Peak postprandial:   less than 180 mg/dL (1-2 hours)      Critically ill patients:  140 - 180 mg/dL   Lab Results  Component Value Date   GLUCAP 124 (H) 05/30/2016   HGBA1C 7.6 (H) 03/07/2016    Review of Glycemic Control: Results for BOBBYE, SAPIA (MRN [id number]) as of 05/30/2016 12:42  Ref. Range 05/28/2016 21:19 05/29/2016 07:02 05/29/2016 12:01 05/29/2016 17:25 05/29/2016 20:53 05/30/2016 06:05 05/30/2016 06:37  Glucose-Capillary Latest Ref Range: 65 - 99 mg/dL 178 (H) 126 (H) 292 (H) 227 (H) 147 (H) 41 (LL) 75    Diabetes history: Type 2 diabetes Outpatient Diabetes medications: Glucotrol 10 mg twice daily before meals, Metformin 1000 mg bid, Januvia 100 mg daily Current orders for Inpatient glycemic control:  Lantus 25 units bid, Novolog moderate tid with meals and HS, Novolog 3 units tid with meals  Inpatient Diabetes Program Recommendations:    Please consider reducing Lantus to 20 units daily.  Text page sent to MD.   Thanks, Adah Perl, RN, BC-ADM Inpatient Diabetes Coordinator Pager [phone] (8a-5p)

## 2016-05-31 ENCOUNTER — Non-Acute Institutional Stay (SKILLED_NURSING_FACILITY): Payer: Medicare Other | Admitting: Adult Health

## 2016-05-31 ENCOUNTER — Encounter: Payer: Self-pay | Admitting: Adult Health

## 2016-05-31 DIAGNOSIS — E871 Hypo-osmolality and hyponatremia: Secondary | ICD-10-CM

## 2016-05-31 DIAGNOSIS — I495 Sick sinus syndrome: Secondary | ICD-10-CM | POA: Diagnosis not present

## 2016-05-31 DIAGNOSIS — D62 Acute posthemorrhagic anemia: Secondary | ICD-10-CM | POA: Diagnosis not present

## 2016-05-31 DIAGNOSIS — K5909 Other constipation: Secondary | ICD-10-CM | POA: Diagnosis not present

## 2016-05-31 DIAGNOSIS — I10 Essential (primary) hypertension: Secondary | ICD-10-CM | POA: Diagnosis not present

## 2016-05-31 DIAGNOSIS — I480 Paroxysmal atrial fibrillation: Secondary | ICD-10-CM | POA: Diagnosis not present

## 2016-05-31 DIAGNOSIS — M545 Low back pain: Secondary | ICD-10-CM

## 2016-05-31 DIAGNOSIS — E118 Type 2 diabetes mellitus with unspecified complications: Secondary | ICD-10-CM

## 2016-05-31 DIAGNOSIS — G629 Polyneuropathy, unspecified: Secondary | ICD-10-CM

## 2016-05-31 DIAGNOSIS — E43 Unspecified severe protein-calorie malnutrition: Secondary | ICD-10-CM

## 2016-05-31 DIAGNOSIS — Z794 Long term (current) use of insulin: Secondary | ICD-10-CM

## 2016-05-31 DIAGNOSIS — R5381 Other malaise: Secondary | ICD-10-CM

## 2016-05-31 DIAGNOSIS — E538 Deficiency of other specified B group vitamins: Secondary | ICD-10-CM

## 2016-05-31 DIAGNOSIS — K8061 Calculus of gallbladder and bile duct with cholecystitis, unspecified, with obstruction: Secondary | ICD-10-CM

## 2016-05-31 DIAGNOSIS — G8929 Other chronic pain: Secondary | ICD-10-CM

## 2016-05-31 NOTE — Progress Notes (Signed)
Patient ID: Andrew Norton, male   DOB: 1927-06-14, 82 y.o.   MRN: [id number]    DATE:  05/31/2016   MRN:  [id number]  BIRTHDAY: [birth year]  Facility:  Nursing Home Location:  [place] [facility] [place] Room Number: [id number]  LEVEL OF CARE:  SNF [phone])  Contact Information    Name [place] Spouse [phone]     Zupko,Scott Son [phone]  [phone]   [place] Father [phone]  [phone]       Code Status History    Date Active Date Inactive Code Status Order ID Comments User Context   05/17/2016  6:28 PM 05/30/2016 10:34 PM Full Code [id number]  Florencia Reasons, MD Inpatient   04/13/2015 12:44 AM 04/16/2015  3:42 PM Full Code [id number]  Allyne Gee, MD Inpatient   11/05/2012  7:13 PM 11/08/2012  4:53 PM Full Code [id number]  [name], MD Inpatient       Chief Complaint  Patient presents with  . Hospitalization Follow-up    HISTORY OF PRESENT ILLNESS:  This is an 82 year old male who has  PMH of hypertension, diabetes mellitus type 2 and neuropathy. He was having new onset of jaundice with an abdominal CT scan which showed intrahepatic and biliary ductal dilation. ERCP done and stone removal on 05/22/16. Biopsy was negative for malignancy. He then started having pauses on telemetry. Cardiology was consulted and was started on flecainide and placed on 05/28/16.  He was visited in the room with wife and son at bedside. Patient requested that this MiraLAX be given as needed instead of routinely. Wife said that he is usually constipated but after having the surgery, he started having regular bowel movements.  He has been admitted for a short-term rehabilitation.   PAST MEDICAL HISTORY:  Past Medical History:  Diagnosis Date  . Anemia   . Arthritis    hips  . Atrial fibrillation ([place])   . Carpal tunnel syndrome    left leg sciatica  . Diabetes mellitus   . Hearing loss    left ear greater loss  . History of shingles     past hx. left hand  . Hyperlipidemia   . Hypertension    EKG, chest x ray 11/15/10 EPIC,  clearance Dr Sharlene Motts on chart/ pt states on meds to "prevent hypertension from diabetes"  . Paget's disease    scrotum  . Paroxysmal atrial fibrillation ([employer]) 05/18/2016  . Positive PPD, treated      CURRENT MEDICATIONS: Reviewed  Patient's Medications  New Prescriptions   No medications on file  Previous Medications   ASPIRIN 81 MG CHEWABLE TABLET    Chew 1 tablet (81 mg total) by mouth daily.   CYANOCOBALAMIN (,VITAMIN B-12,) 1000 MCG/ML INJECTION    Inject 1,000 mcg into the muscle every 30 (thirty) days.   CYCLOBENZAPRINE (FLEXERIL) 10 MG TABLET    TAKE 1 TABLET 3 TIMES DAILYAS NEEDED FOR MUSCLE SPASMS   FERROUS SULFATE 220 (44 FE) MG/5ML LIQD    TAKE 7 MLS BY MOUTH DAILY   FINASTERIDE (PROSCAR) 5 MG TABLET    TAKE 1 TABLET DAILY   FLECAINIDE (TAMBOCOR) 50 MG TABLET    Take 1 tablet (50 mg total) by mouth every 12 (twelve) hours.   GABAPENTIN (NEURONTIN) 400 MG CAPSULE    TAKE 1 CAPSULE 4 TIMES     DAILY   GLIPIZIDE (GLUCOTROL) 10 MG TABLET    TAKE 1 TABLET TWICE A DAY  BEFORE MEALS   HYDROCODONE-ACETAMINOPHEN (NORCO/VICODIN) 5-325 MG TABLET    Take 1 tablet by mouth every 6 (six) hours as needed for moderate pain.   INSULIN GLARGINE (LANTUS) 100 UNIT/ML INJECTION    Inject 0.2 mLs (20 Units total) into the skin 2 (two) times daily.   LIDOCAINE (LIDODERM) 5 %    Place 1 patch onto the skin daily. Remove & Discard patch within 12 hours or as directed by MD   LOSARTAN (COZAAR) 25 MG TABLET    Take 1 tablet (25 mg total) by mouth daily.   METFORMIN (GLUCOPHAGE) 1000 MG TABLET    TAKE 1 TABLET TWICE A DAY  WITH MEALS   METOPROLOL TARTRATE (LOPRESSOR) 25 MG TABLET    Take 0.5 tablets (12.5 mg total) by mouth 2 (two) times daily.   NUTRITIONAL SUPPLEMENT LIQD    Take 120 mLs by mouth 2 (two) times daily. SF [employer] for nutritional support   POLYETHYLENE GLYCOL POWDER (GLYCOLAX/MIRALAX) POWDER    MIX 1  CAPFUL IN LIQUID OF CHOICE AND DRINK ONCE DAILY AS DIRECTED   RIVAROXABAN (XARELTO) 20 MG TABS TABLET    Take 1 tablet (20 mg total) by mouth daily with supper.   SITAGLIPTIN (JANUVIA) 100 MG TABLET    Take 1 tablet (100 mg total) by mouth daily.  Modified Medications   No medications on file  Discontinued Medications   No medications on file     Allergies  Allergen Reactions  . Penicillins Hives         REVIEW OF SYSTEMS:  GENERAL: no change in appetite, no fatigue, no weight changes, no fever, chills or weakness EYES: Denies change in vision, dry eyes, eye pain, itching or discharge EARS: Denies change in hearing, ringing in ears, or earache NOSE: Denies nasal congestion or epistaxis MOUTH and THROAT: Denies oral discomfort, gingival pain or bleeding, pain from teeth or hoarseness   RESPIRATORY: no cough, SOB, DOE, wheezing, hemoptysis CARDIAC: no chest pain, edema or palpitations GI: no abdominal pain, diarrhea, constipation, heart burn, nausea or vomiting GU: Denies dysuria, frequency, hematuria, incontinence, or discharge PSYCHIATRIC: Denies feeling of depression or anxiety. No report of hallucinations, insomnia, paranoia, or agitation    PHYSICAL EXAMINATION  GENERAL APPEARANCE: Well nourished. In no acute distress. Normal body habitus SKIN:  Skin is warm and dry. Left chest has pacemaker surgical site with steri-strips HEAD: Normal in size and contour. No evidence of trauma EYES: Lids open and close normally. No blepharitis, entropion or ectropion. PERRL. Conjunctivae are clear and sclerae are white. Lenses are without opacity EARS: Pinnae are normal. Patient hears normal voice tunes of the examiner MOUTH and THROAT: Lips are without lesions. Oral mucosa is moist and without lesions. Tongue is normal in shape, size, and color and without lesions NECK: supple, trachea midline, no neck masses, no thyroid tenderness, no thyromegaly LYMPHATICS: no LAN in the neck, no  supraclavicular LAN RESPIRATORY: breathing is even & unlabored, BS CTAB CARDIAC: RRR, no murmur,no extra heart sounds, no edema GI: abdomen soft, normal BS, no masses, no tenderness, no hepatomegaly, no splenomegaly GU:  Has condom catheter draining to urine bag with yellowish urine PSYCHIATRIC: Alert and oriented X 3. Affect and behavior are appropriate  LABS/RADIOLOGY: Labs reviewed: Basic Metabolic Panel:  Recent Labs  05/22/16 0511  05/26/16 0357 05/26/16 0358 05/27/16 0540 05/30/16 0254  NA 133*  < >  --  134* 132* 130*  K 3.7  < >  --  4.5 4.7 4.1  CL 99*  < >  --  99* 99* 99*  CO2 23  < >  --  27 27 24   GLUCOSE 217*  < >  --  268* 206* 65  BUN 20  < >  --  29* 26* 23*  CREATININE 0.72  < >  --  0.80 0.78 0.93  CALCIUM 8.5*  < >  --  8.7* 8.7* 8.4*  MG 1.8  --  2.0  --   --  1.9  < > = values in this interval not displayed. Liver Function Tests:  Recent Labs  05/26/16 0358 05/27/16 0540 05/30/16 0254  AST 112* 90* 119*  ALT 121* 110* 88*  ALKPHOS 668* 666* 708*  BILITOT 4.1* 3.6* 3.0*  PROT 6.4* 6.2* 5.7*  ALBUMIN 3.0* 2.9* 2.5*    Recent Labs  05/09/16 1627  LIPASE 27.0  AMYLASE 40    CBC:  Recent Labs  05/09/16 1627 05/17/16 1227 05/18/16 0357  05/27/16 0540 05/28/16 0420 05/30/16 0254  WBC 6.9 20.2 Repeated and verified X2.* 8.8  < > 8.7 10.6* 8.4  NEUTROABS 3.8 17.2* 7.0  --   --   --   --   HGB 9.7* 9.4* 8.0*  < > 8.2* 8.7* 8.3*  HCT 28.8* 28.0* 23.5*  < > 23.5* 24.6* 24.1*  MCV 92.5 92.6 91.8  < > 91.4 91.8 90.9  PLT 297.0 348.0 272  < > 221 193 96*  < > = values in this interval not displayed.  Lipid Panel:  Recent Labs  10/05/15 1112  HDL 43.00    CBG:  Recent Labs  05/30/16 0637 05/30/16 1230 05/30/16 1757  GLUCAP 75 124* 126*      Dg Chest 2 View  Result Date: 05/29/2016 CLINICAL DATA:  82 year old male. Cardiac device. Initial encounter. EXAM: CHEST  2 VIEW COMPARISON:  04/13/2015 FINDINGS: Sequential  pacemaker enters from the left with leads in the region of the right atrium (lateral wall region) and right ventricle. Cardiomegaly. Central pulmonary vascular prominence.  No frank pulmonary edema. Biapical pleural thickening without associated bony destruction similar to prior exams. No pneumothorax. Calcified slightly tortuous aorta. Postsurgical changes cervical spine. Right acromioclavicular joint degenerative changes. No plain film evidence of pulmonary malignancy. IMPRESSION: Sequential pacemaker enters from the left with leads in the region of the right atrium and right ventricle. Cardiomegaly. Central pulmonary vascular prominence.  No frank pulmonary edema. Electronically Signed   By: Genia Del M.D.   On: 05/29/2016 07:21   US Abdomen Complete  Result Date: 05/23/2016 CLINICAL DATA:  Recent common bile duct calculus EXAM: ABDOMEN ULTRASOUND COMPLETE COMPARISON:  ERCP May 22, 2016. FINDINGS: Gallbladder: No gallstones are evident. The gallbladder wall is thickened and edematous. There is no pericholecystic fluid. No sonographic Murphy sign noted by sonographer. Common bile duct: Diameter: 13 mm, dilated. There is moderate edema in the common hepatic and common bile duct. No mass or calculus is seen by ultrasound currently in the biliary ductal system. A small amount of air in the intrahepatic ducts may be due to the recent common bile duct manipulation. The intrahepatic biliary ducts do not appear appreciably dilated by ultrasound. Liver: No focal lesion identified. Liver echogenicity is overall increased. IVC: No abnormality visualized. Pancreas: Visualized portion unremarkable. Portions of the pancreatic tail are obscured by gas. Spleen: Size and appearance within normal limits. Right Kidney: Length: 12.6 cm. Echogenicity within normal limits. No mass or hydronephrosis visualized. There is renal cortical thinning. Left Kidney: Length:  11.6 cm. Echogenicity within normal limits. No mass or  hydronephrosis visualized. There is renal cortical thinning. Abdominal aorta: No aneurysm visualized. Other findings: No demonstrable ascites. IMPRESSION: Gallbladder wall is thickened and edematous. No gallstones seen. A degree of acalculus cholecystitis must be of concern. Common bile duct remains dilated without mass or calculus evident by ultrasound. There is edema in the wall of the common bile duct. A small amount of air in the intrahepatic biliary ductal system is likely secondary to the recent biliary ductal manipulation. Portions of pancreas obscured by gas. Visualized portions of pancreas appear normal. Renal cortical thinning bilaterally may be a function of age or may be seen with medical renal disease. Kidneys otherwise appear unremarkable. Study otherwise unremarkable. Electronically Signed   By: Lowella Grip III M.D.   On: 05/23/2016 15:19   Ct Abdomen Pelvis W Contrast  Result Date: 05/11/2016 CLINICAL DATA:  Jaundice for about 2 weeks, abdominal swelling EXAM: CT ABDOMEN AND PELVIS WITH CONTRAST TECHNIQUE: Multidetector CT imaging of the abdomen and pelvis was performed using the standard protocol following bolus administration of intravenous contrast. CONTRAST:  165mL ISOVUE-300 IOPAMIDOL (ISOVUE-300) INJECTION 61% COMPARISON:  None. FINDINGS: Lower chest: Lung bases shows mild peripheral interstitial prominence probable probable chronic in nature. No segmental infiltrate. Hepatobiliary: There is intrahepatic biliary ductal dilatation. No focal hepatic mass. No calcified gallstones are noted within gallbladder. There is significant CBD dilatation up to 1.8 cm. There is abrupt narrowing of CBD at the level of ampulla There is a polypoid lesion at the level of ampulla within duodenum measures about 1.5 cm axial image 47. Ampullary mass cannot be excluded. Further correlation with ERCP is recommended. Pancreas: There is fatty replaced pancreas. No focal pancreatic mass. No dilatation of  pancreatic duct. Spleen: Enhanced spleen is unremarkable. Adrenals/Urinary Tract: No adrenal gland mass. Mild for renal cortical thinning and lobulated contour probable due to atrophy. Bilateral renal symmetrical enhancement. Delayed renal images shows bilateral renal symmetrical excretion. Bilateral visualized proximal ureter is unremarkable. Stomach/Bowel: There is no small bowel obstruction. Oral contrast material was given to the patient. Some liquid stool noted in right colon. No pericecal inflammation. The terminal ileum is unremarkable. Liquid stool noted in transverse colon and descending colon. No evidence of obstructing mass. Moderate stool noted within redundant sigmoid colon. Moderate stool noted within rectum. No definite colonic obstruction. No colitis or acute diverticulitis. Vascular/Lymphatic: Mild atherosclerotic calcifications of distal abdominal aorta. No aortic aneurysm. No retroperitoneal or mesenteric adenopathy. Reproductive: Prostate gland is unremarkable. No bladder filling defects are noted. There are metallic artifacts from left hip prosthesis. Other: No ascites or free abdominal air.  No inguinal adenopathy. Musculoskeletal: No destructive bony lesions are noted. Sagittal images of the spine shows diffuse osteopenia. Degenerative changes lumbar spine. There is mild to moderate compression deformity L1 vertebral body of indeterminate age. Clinical correlation is necessary. IMPRESSION: 1. There is intrahepatic biliary ductal dilatation. No focal hepatic mass. No calcified gallstones are noted within gallbladder. There is significant CBD dilatation up to 1.8 cm. There is abrupt narrowing of CBD at the level of ampulla There is a polypoid lesion at the level of ampulla within duodenum measures about 1.5 cm axial image 47. Ampullary mass cannot be excluded. Further correlation with ERCP is recommended. 2. Moderate colonic stool in distal colon. No definite evidence of acute colitis or  diverticulitis. 3. No small bowel obstruction. 4. Metallic artifacts from left hip prosthesis. Compression deformity L1 vertebral body of indeterminate age. Clinical correlation is necessary.  Electronically Signed   By: Lahoma Crocker M.D.   On: 05/11/2016 16:58   Dg Ercp Biliary & Pancreatic Ducts  Result Date: 05/22/2016 CLINICAL DATA:  Gallstone EXAM: ERCP TECHNIQUE: Multiple spot images obtained with the fluoroscopic device and submitted for interpretation post-procedure. FLUOROSCOPY TIME:  Fluoroscopy Time:  5 minutes and 12 seconds Radiation Exposure Index (if provided by the fluoroscopic device): 87 mGy Number of Acquired Spot Images: 6 COMPARISON:  None. FINDINGS: The common bile duct is markedly dilated. There is a filling defect in the distal common bile duct. Contrast also fills the pancreatic duct. IMPRESSION: See above. These images were submitted for radiologic interpretation only. Please see the procedural report for the amount of contrast and the fluoroscopy time utilized. Electronically Signed   By: Marybelle Killings M.D.   On: 05/22/2016 16:03    ASSESSMENT/PLAN:  Physical deconditioning - for rehabilitation, PT and OT, for therapeutic strengthening exercises; fall precaution  Biliary stone with bile duct obstruction - ERCP was done that showed biliary stone on 05/22/16, stone was removed, stent was unable to be placed; biopsy result was negative for malignancy; completed course of ciprofloxacin + Flagyl;    Tachybradycardia syndrome - S/P PPM; monitor surgical site for infection; follow-up with cardiology on 06/11/16  Paroxysmal atrial fibrillation -  S/P PPM, continue flecainide 50 mg 1 tab by mouth every 12 hours, metoprolol tartrate 25 mg 1/2 tab = 12.5 mg by mouth twice a day and Xarelto 20 mg 1 tab by mouth daily  Essential hypertension - continue metoprolol tartrate 25 mg 1/2 tab = 12.5 mg by mouth twice a day, losartan 25 mg 1 tab by mouth daily; check BP/HR BID X 1  week  Diabetes mellitus, type II   - continue Lantus 100 units/mL inject 20 units subcutaneous twice a day, metformin 1000 mg 1 tab by mouth twice a day, Januvia 100 mg 1 tab by mouth daily and  discontinue glipizide 10 mg 1 tab by mouth twice a day; check CBG twice a day and at bedtime Lab Results  Component Value Date   HGBA1C 7.6 (H) 03/07/2016   Protein calorie malnutrition, severe - albumin 2.5 ; RD consult, continue med past sugar-free 120 mL by mouth twice a day  Chronic constipation - verbalized having regular bowel movements after biliary stone removal, change MiraLAX to 17 mg by mouth daily when necessary  Anemia, acute blood loss - continue ferrous sulfate 220 mg/5 ML give 7 mL by mouth daily; check CBC on 06/05/16 Lab Results  Component Value Date   HGB 8.3 (L) 05/30/2016   Hyponatremia - Na 130; check BMP on 06/05/16  Vitamin B12 deficiency - continue cyanocobalamin 1000 g/ML injection every 30 days  Low back pain - continue lidocaine 5% 1 patch to lower back daily and Norco 5/325 mg 1 tab by mouth every 6 hours when necessary for pain and cyclobenzaprine 10 mg 1 tab by mouth 3 times a day for muscle spasm  Neuropathy - Continue gabapentin 400 mg 1 capsule by mouth 4 times a day     Goals of care:  Short-term rehabilitation    Durenda Age, NP [place] [phone]

## 2016-05-31 NOTE — Progress Notes (Deleted)
DATE:  05/31/2016 MRN:  [id number]  BIRTHDAY: November 02, 1926  Facility:  [facility] Home Location:  [place] [facility] [place] Room Number: [id number]  LEVEL OF CARE:  SNF [phone])  Contact Information    Name Relation Home Work Mobile   Ratajczak,Helen Spouse [phone]     Dilone,Scott Son [phone]  [phone]   Cassia Father [phone]  [phone]       Code Status History    Date Active Date Inactive Code Status Order ID Comments User Context   05/17/2016  6:28 PM 05/30/2016 10:34 PM Full Code [id number]  Florencia Reasons, MD Inpatient   04/13/2015 12:44 AM 04/16/2015  3:42 PM Full Code [id number]  Allyne Gee, MD Inpatient   11/05/2012  7:13 PM 11/08/2012  4:53 PM Full Code [id number]  [name], MD Inpatient       Chief Complaint  Patient presents with  . Hospitalization Follow-up    HISTORY OF PRESENT ILLNESS:    PAST MEDICAL HISTORY:  Past Medical History:  Diagnosis Date  . Anemia   . Arthritis    hips  . Atrial fibrillation ([place])   . Carpal tunnel syndrome    left leg sciatica  . Diabetes mellitus   . Hearing loss    left ear greater loss  . History of shingles    past hx. left hand  . Hyperlipidemia   . Hypertension    EKG, chest x ray 11/15/10 EPIC,  clearance Dr Sharlene Motts on chart/ pt states on meds to "prevent hypertension from diabetes"  . Paget's disease    scrotum  . Paroxysmal atrial fibrillation ([place]) 05/18/2016  . Positive PPD, treated      CURRENT MEDICATIONS: Reviewed  Patient's Medications  New Prescriptions   No medications on file  Previous Medications   ASPIRIN 81 MG CHEWABLE TABLET    Chew 1 tablet (81 mg total) by mouth daily.   CYANOCOBALAMIN (,VITAMIN B-12,) 1000 MCG/ML INJECTION    Inject 1,000 mcg into the muscle every 30 (thirty) days.   CYCLOBENZAPRINE (FLEXERIL) 10 MG TABLET    TAKE 1 TABLET 3 TIMES DAILYAS NEEDED FOR MUSCLE SPASMS   FERROUS SULFATE 220 (44 FE) MG/5ML LIQD    TAKE 7 MLS BY MOUTH DAILY   FINASTERIDE (PROSCAR) 5 MG TABLET    TAKE 1 TABLET DAILY   FLECAINIDE (TAMBOCOR) 50 MG TABLET    Take 1 tablet (50 mg total) by mouth every 12 (twelve) hours.   GABAPENTIN (NEURONTIN) 400 MG CAPSULE    TAKE 1 CAPSULE 4 TIMES     DAILY   GLIPIZIDE (GLUCOTROL) 10 MG TABLET    TAKE 1 TABLET TWICE A DAY  BEFORE MEALS   HYDROCODONE-ACETAMINOPHEN (NORCO/VICODIN) 5-325 MG TABLET    Take 1 tablet by mouth every 6 (six) hours as needed for moderate pain.   INSULIN GLARGINE (LANTUS) 100 UNIT/ML INJECTION    Inject 0.2 mLs (20 Units total) into the skin 2 (two) times daily.   LIDOCAINE (LIDODERM) 5 %    Place 1 patch onto the skin daily. Remove & Discard patch within 12 hours or as directed by MD   LOSARTAN (COZAAR) 25 MG TABLET    Take 1 tablet (25 mg total) by mouth daily.   METFORMIN (GLUCOPHAGE) 1000 MG TABLET    TAKE 1 TABLET TWICE A DAY  WITH MEALS   METOPROLOL TARTRATE (LOPRESSOR) 25 MG TABLET    Take 0.5 tablets (12.5 mg total) by mouth 2 (two) times  daily.   NUTRITIONAL SUPPLEMENT LIQD    Take 120 mLs by mouth 2 (two) times daily. SF [employer] for nutritional support   POLYETHYLENE GLYCOL POWDER (GLYCOLAX/MIRALAX) POWDER    MIX 1 CAPFUL IN LIQUID OF CHOICE AND DRINK ONCE DAILY AS DIRECTED   RIVAROXABAN (XARELTO) 20 MG TABS TABLET    Take 1 tablet (20 mg total) by mouth daily with supper.   SITAGLIPTIN (JANUVIA) 100 MG TABLET    Take 1 tablet (100 mg total) by mouth daily.  Modified Medications   No medications on file  Discontinued Medications   No medications on file     Allergies  Allergen Reactions  . Penicillins Hives    Has patient had a PCN reaction causing immediate rash, facial/tongue/throat swelling, SOB or lightheadedness with hypotension: No Has patient had a PCN reaction causing severe rash involving mucus membranes or skin necrosis: No Has patient had a PCN reaction that required hospitalization No Has patient had a PCN reaction occurring within the last 10 years:  {Yes/No:[id number]} If all of the above answers are "NO", then may proceed with Cephalosporin use.      REVIEW OF SYSTEMS:  GENERAL: No change in appetite, no fatigue, no weight changes, no fever, chills or weakness SKIN: Denies rash, itching, wounds, ulcer sores, or nail abnormality EYES: Denies change in vision, dry eyes, eye pain, itching or discharge EARS: Denies change in hearing, ringing in ears, or earache NOSE: Denies nasal congestion or epistaxis MOUTH and THROAT: Denies oral discomfort, gingival pain or bleeding, pain from teeth or hoarseness   RESPIRATORY: No cough, SOB, DOE, wheezing, hemoptysis CARDIAC: No chest pain, edema or palpitations GI: No abdominal pain, diarrhea, constipation, heart burn, nausea or vomiting GU: Denies dysuria, frequency, hematuria, incontinence, or discharge MUSCULOSKELETAL: Denies joit pain, muscle pain, back pain, restricted movement, or unusual weakness CIRCULATION: Denies claudication, edema of legs, varicosities, or cold extremities NEUROLOGICAL: Denies dizziness, syncope, numbness, or headache PSYCHIATRIC: Denies feeling of depression or anxiety. No report of hallucinations, insomnia, paranoia, or agitation ENDOCRINE: Denies polyphagia, polyuria, polydipsia, heat or cold intolerance HEME/LYMPH: Denies excessive bruising, petechia, enlarged lymph nodes, or bleeding problems IMMUNOLOGIC: Denies history of frequent infections, AIDS, or use of immunosuppressive agents    PHYSICAL EXAMINATION  GENERAL APPEARANCE: Well nourished. In no acute distress. Normal body habitus SKIN:  Skin is warm and dry. There are no suspicious lesions or rash HEAD: Normal in size and contour. No evidence of trauma EYES: Lids open and close normally. No blepharitis, entropion or ectropion. PERRL. Conjunctivae are clear and sclerae are white. Lenses are without opacity EARS: Pinnae are normal. Patient hears normal voice tunes of the examiner MOUTH and THROAT: Lips are  without lesions. Oral mucosa is moist and without lesions. Tongue is normal in shape, size, and color and without lesions NECK: supple, trachea midline, no neck masses, no thyroid tenderness, no thyromegaly LYMPHATICS: No LAN in the neck, no supraclavicular LAN RESPIRATORY: Breathing is even & unlabored, BS CTAB CARDIAC: RRR, no murmur,no extra heart sounds, no edema GI: Abdomen soft, normal BS, no masses, no tenderness, no hepatomegaly, no splenomegaly MUSCULOSKELETAL: No deformities. Movement at each extremity is full and painless. Strength is 5/5 at each extremity. Back is without kyphosis or scoliosis CIRCULATION: Pedal pulses are 2+. There is no edema of the legs, ankles and feet NEUROLOGICAL: There is no tremor. Speech is clear PSYCHIATRIC: Alert and oriented X 3. Affect and behavior are appropriate.  LABS/RADIOLOGY: Labs reviewed: Basic Metabolic Panel:  Recent Labs  05/22/16 0511  05/26/16 0357 05/26/16 0358 05/27/16 0540 05/30/16 0254  NA 133*  < >  --  134* 132* 130*  K 3.7  < >  --  4.5 4.7 4.1  CL 99*  < >  --  99* 99* 99*  CO2 23  < >  --  27 27 24   GLUCOSE 217*  < >  --  268* 206* 65  BUN 20  < >  --  29* 26* 23*  CREATININE 0.72  < >  --  0.80 0.78 0.93  CALCIUM 8.5*  < >  --  8.7* 8.7* 8.4*  MG 1.8  --  2.0  --   --  1.9  < > = values in this interval not displayed. Liver Function Tests:  Recent Labs  05/26/16 0358 05/27/16 0540 05/30/16 0254  AST 112* 90* 119*  ALT 121* 110* 88*  ALKPHOS 668* 666* 708*  BILITOT 4.1* 3.6* 3.0*  PROT 6.4* 6.2* 5.7*  ALBUMIN 3.0* 2.9* 2.5*    Recent Labs  05/09/16 1627  LIPASE 27.0  AMYLASE 40   No results for input(s): AMMONIA in the last 8760 hours. CBC:  Recent Labs  05/09/16 1627 05/17/16 1227 05/18/16 0357  05/27/16 0540 05/28/16 0420 05/30/16 0254  WBC 6.9 20.2 Repeated and verified X2.* 8.8  < > 8.7 10.6* 8.4  NEUTROABS 3.8 17.2* 7.0  --   --   --   --   HGB 9.7* 9.4* 8.0*  < > 8.2* 8.7* 8.3*    HCT 28.8* 28.0* 23.5*  < > 23.5* 24.6* 24.1*  MCV 92.5 92.6 91.8  < > 91.4 91.8 90.9  PLT 297.0 348.0 272  < > 221 193 96*  < > = values in this interval not displayed. A1C: Invalid input(s): A1C Lipid Panel:  Recent Labs  10/05/15 1112  HDL 43.00   Cardiac Enzymes: No results for input(s): CKTOTAL, CKMB, CKMBINDEX, TROPONINI in the last 8760 hours. BNP: Invalid input(s): POCBNP CBG:  Recent Labs  05/30/16 0637 05/30/16 1230 05/30/16 1757  GLUCAP 75 124* 126*      Dg Chest 2 View  Result Date: 05/29/2016 CLINICAL DATA:  82 year old male. Cardiac device. Initial encounter. EXAM: CHEST  2 VIEW COMPARISON:  04/13/2015 FINDINGS: Sequential pacemaker enters from the left with leads in the region of the right atrium (lateral wall region) and right ventricle. Cardiomegaly. Central pulmonary vascular prominence.  No frank pulmonary edema. Biapical pleural thickening without associated bony destruction similar to prior exams. No pneumothorax. Calcified slightly tortuous aorta. Postsurgical changes cervical spine. Right acromioclavicular joint degenerative changes. No plain film evidence of pulmonary malignancy. IMPRESSION: Sequential pacemaker enters from the left with leads in the region of the right atrium and right ventricle. Cardiomegaly. Central pulmonary vascular prominence.  No frank pulmonary edema. Electronically Signed   By: Genia Del M.D.   On: 05/29/2016 07:21   US Abdomen Complete  Result Date: 05/23/2016 CLINICAL DATA:  Recent common bile duct calculus EXAM: ABDOMEN ULTRASOUND COMPLETE COMPARISON:  ERCP May 22, 2016. FINDINGS: Gallbladder: No gallstones are evident. The gallbladder wall is thickened and edematous. There is no pericholecystic fluid. No sonographic Murphy sign noted by sonographer. Common bile duct: Diameter: 13 mm, dilated. There is moderate edema in the common hepatic and common bile duct. No mass or calculus is seen by ultrasound currently in the  biliary ductal system. A small amount of air in the intrahepatic ducts may be due to the recent common bile duct  manipulation. The intrahepatic biliary ducts do not appear appreciably dilated by ultrasound. Liver: No focal lesion identified. Liver echogenicity is overall increased. IVC: No abnormality visualized. Pancreas: Visualized portion unremarkable. Portions of the pancreatic tail are obscured by gas. Spleen: Size and appearance within normal limits. Right Kidney: Length: 12.6 cm. Echogenicity within normal limits. No mass or hydronephrosis visualized. There is renal cortical thinning. Left Kidney: Length: 11.6 cm. Echogenicity within normal limits. No mass or hydronephrosis visualized. There is renal cortical thinning. Abdominal aorta: No aneurysm visualized. Other findings: No demonstrable ascites. IMPRESSION: Gallbladder wall is thickened and edematous. No gallstones seen. A degree of acalculus cholecystitis must be of concern. Common bile duct remains dilated without mass or calculus evident by ultrasound. There is edema in the wall of the common bile duct. A small amount of air in the intrahepatic biliary ductal system is likely secondary to the recent biliary ductal manipulation. Portions of pancreas obscured by gas. Visualized portions of pancreas appear normal. Renal cortical thinning bilaterally may be a function of age or may be seen with medical renal disease. Kidneys otherwise appear unremarkable. Study otherwise unremarkable. Electronically Signed   By: Lowella Grip III M.D.   On: 05/23/2016 15:19   Ct Abdomen Pelvis W Contrast  Result Date: 05/11/2016 CLINICAL DATA:  Jaundice for about 2 weeks, abdominal swelling EXAM: CT ABDOMEN AND PELVIS WITH CONTRAST TECHNIQUE: Multidetector CT imaging of the abdomen and pelvis was performed using the standard protocol following bolus administration of intravenous contrast. CONTRAST:  190mL ISOVUE-300 IOPAMIDOL (ISOVUE-300) INJECTION 61% COMPARISON:   None. FINDINGS: Lower chest: Lung bases shows mild peripheral interstitial prominence probable probable chronic in nature. No segmental infiltrate. Hepatobiliary: There is intrahepatic biliary ductal dilatation. No focal hepatic mass. No calcified gallstones are noted within gallbladder. There is significant CBD dilatation up to 1.8 cm. There is abrupt narrowing of CBD at the level of ampulla There is a polypoid lesion at the level of ampulla within duodenum measures about 1.5 cm axial image 47. Ampullary mass cannot be excluded. Further correlation with ERCP is recommended. Pancreas: There is fatty replaced pancreas. No focal pancreatic mass. No dilatation of pancreatic duct. Spleen: Enhanced spleen is unremarkable. Adrenals/Urinary Tract: No adrenal gland mass. Mild for renal cortical thinning and lobulated contour probable due to atrophy. Bilateral renal symmetrical enhancement. Delayed renal images shows bilateral renal symmetrical excretion. Bilateral visualized proximal ureter is unremarkable. Stomach/Bowel: There is no small bowel obstruction. Oral contrast material was given to the patient. Some liquid stool noted in right colon. No pericecal inflammation. The terminal ileum is unremarkable. Liquid stool noted in transverse colon and descending colon. No evidence of obstructing mass. Moderate stool noted within redundant sigmoid colon. Moderate stool noted within rectum. No definite colonic obstruction. No colitis or acute diverticulitis. Vascular/Lymphatic: Mild atherosclerotic calcifications of distal abdominal aorta. No aortic aneurysm. No retroperitoneal or mesenteric adenopathy. Reproductive: Prostate gland is unremarkable. No bladder filling defects are noted. There are metallic artifacts from left hip prosthesis. Other: No ascites or free abdominal air.  No inguinal adenopathy. Musculoskeletal: No destructive bony lesions are noted. Sagittal images of the spine shows diffuse osteopenia. Degenerative  changes lumbar spine. There is mild to moderate compression deformity L1 vertebral body of indeterminate age. Clinical correlation is necessary. IMPRESSION: 1. There is intrahepatic biliary ductal dilatation. No focal hepatic mass. No calcified gallstones are noted within gallbladder. There is significant CBD dilatation up to 1.8 cm. There is abrupt narrowing of CBD at the level of ampulla There is  a polypoid lesion at the level of ampulla within duodenum measures about 1.5 cm axial image 47. Ampullary mass cannot be excluded. Further correlation with ERCP is recommended. 2. Moderate colonic stool in distal colon. No definite evidence of acute colitis or diverticulitis. 3. No small bowel obstruction. 4. Metallic artifacts from left hip prosthesis. Compression deformity L1 vertebral body of indeterminate age. Clinical correlation is necessary. Electronically Signed   By: Lahoma Crocker M.D.   On: 05/11/2016 16:58   Dg Ercp Biliary & Pancreatic Ducts  Result Date: 05/22/2016 CLINICAL DATA:  Gallstone EXAM: ERCP TECHNIQUE: Multiple spot images obtained with the fluoroscopic device and submitted for interpretation post-procedure. FLUOROSCOPY TIME:  Fluoroscopy Time:  5 minutes and 12 seconds Radiation Exposure Index (if provided by the fluoroscopic device): 87 mGy Number of Acquired Spot Images: 6 COMPARISON:  None. FINDINGS: The common bile duct is markedly dilated. There is a filling defect in the distal common bile duct. Contrast also fills the pancreatic duct. IMPRESSION: See above. These images were submitted for radiologic interpretation only. Please see the procedural report for the amount of contrast and the fluoroscopy time utilized. Electronically Signed   By: Marybelle Killings M.D.   On: 05/22/2016 16:03    ASSESSMENT/PLAN:          [employer] [id number]

## 2016-06-01 ENCOUNTER — Non-Acute Institutional Stay (SKILLED_NURSING_FACILITY): Payer: Medicare Other | Admitting: Internal Medicine

## 2016-06-01 ENCOUNTER — Encounter: Payer: Self-pay | Admitting: Internal Medicine

## 2016-06-01 DIAGNOSIS — M545 Low back pain, unspecified: Secondary | ICD-10-CM

## 2016-06-01 DIAGNOSIS — Z794 Long term (current) use of insulin: Secondary | ICD-10-CM

## 2016-06-01 DIAGNOSIS — K831 Obstruction of bile duct: Secondary | ICD-10-CM

## 2016-06-01 DIAGNOSIS — G8929 Other chronic pain: Secondary | ICD-10-CM

## 2016-06-01 DIAGNOSIS — I480 Paroxysmal atrial fibrillation: Secondary | ICD-10-CM | POA: Diagnosis not present

## 2016-06-01 DIAGNOSIS — E871 Hypo-osmolality and hyponatremia: Secondary | ICD-10-CM

## 2016-06-01 DIAGNOSIS — K839 Disease of biliary tract, unspecified: Secondary | ICD-10-CM | POA: Diagnosis not present

## 2016-06-01 DIAGNOSIS — I10 Essential (primary) hypertension: Secondary | ICD-10-CM

## 2016-06-01 DIAGNOSIS — E1140 Type 2 diabetes mellitus with diabetic neuropathy, unspecified: Secondary | ICD-10-CM | POA: Diagnosis not present

## 2016-06-01 DIAGNOSIS — R531 Weakness: Secondary | ICD-10-CM

## 2016-06-01 DIAGNOSIS — K838 Other specified diseases of biliary tract: Secondary | ICD-10-CM

## 2016-06-01 DIAGNOSIS — R1319 Other dysphagia: Secondary | ICD-10-CM | POA: Diagnosis not present

## 2016-06-01 NOTE — Progress Notes (Signed)
LOCATION: [facility]  PCP: Laurey Morale, MD   Code Status: Full Code  Goals of care: Advanced Directive information Advanced Directives 05/22/2016  Does patient have an advance directive? Yes  Type of Paramedic of [place];Living will  Does patient want to make changes to advanced directive? -  Copy of advanced directive(s) in chart? -  Pre-existing out of facility DNR order (yellow form or pink MOST form) -       Extended Emergency Contact Information Primary Emergency Contact: Arreola,Helen Address: [address]          [place], [place] [address] [place] Phone: [phone] Relation: Spouse Secondary Emergency Contact: [place], [place] [address] [place] Phone: [phone] Mobile Phone: [phone] Relation: Son Father: Kaley,Stephen Address: [address]          [place], [place] [address] [place] Phone: [phone] Mobile Phone: [phone]   Allergies  Allergen Reactions  . Penicillins Hives    Chief Complaint  Patient presents with  . New Admit To SNF    New Admission Visit     HPI:  Patient is a 82 y.o. male seen today for short term rehabilitation post hospital admission from 05/17/2016-05/30/2016 with new onset jaundice with CT abdomen showing intrahepatic and biliary ductal dilation. 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 He underwent sphincterotomy and removal of the stone on 05/22/2016. He underwent biopsy of the ampullary lesion and that was negative for malignancy. No stent was placed. He was placed on antibiotics and has completed the course. He also had A. fib and nonsustained ventricular tachycardia in the hospital. He required flecainide and had a pacemaker placed. He has medical history of hypertension, diabetes mellitus type 2, neuropathy among others. He is seen in his room today.  Review of  Systems:  Constitutional: Negative for fever, chills, diaphoresis. Energy level is slowly coming back. HENT: Negative for headache, congestion, nasal discharge, sore throat. Positive for difficulty swallowing (chronic dysphagia)   Eyes: Negative for blurred vision, double vision and discharge.  Respiratory: Negative for cough, shortness of breath and wheezing.   Cardiovascular: Negative for chest pain, palpitations, leg swelling.  Gastrointestinal: Negative for heartburn, nausea, vomiting, abdominal pain. Last bowel movement was this morning.  Genitourinary: Negative for dysuria.  Musculoskeletal: Negative for back pain, fall in the facility.  Skin: Negative for itching, rash.  Neurological: Negative for dizziness. Psychiatric/Behavioral: Negative for depression.   Past Medical History:  Diagnosis Date  . Anemia   . Arthritis    hips  . Atrial fibrillation ([place])   . Carpal tunnel syndrome    left leg sciatica  . Diabetes mellitus   . Hearing loss    left ear greater loss  . History of shingles    past hx. left hand  . Hyperlipidemia   . Hypertension    EKG, chest x ray 11/15/10 EP[facility],  clearance Dr Sharlene Motts on chart/ pt states on meds to "prevent hypertension from diabetes"  . Paget's disease    scrotum  . Paroxysmal atrial fibrillation ([place]) 05/18/2016  . Positive PPD, treated    Past Surgical History:  Procedure Laterality Date  . CERVICAL FUSION     dr Vertell Limber 2009- retained hardware  . colonoscopy  2-08   per Dr. Oletta Lamas, normal   . EP IMPLANTABLE DEVICE N/A 05/28/2016   Procedure: Pacemaker  Implant;  Surgeon: Will Meredith Leeds, MD;  Location: [facility];  Service: Cardiovascular;  Laterality: N/A;  . ERCP N/A 05/22/2016   Procedure: ENDOSCOPIC RETROGRADE CHOLANGIOPANCREATOGRAPHY (ERCP);  Surgeon: Milus Banister, MD;  Location: [facility];  Service: Endoscopy;  Laterality: N/A;  . EYE SURGERY     bilateral cataract extraction with IOL  . HIP FRACTURE SURGERY   11-24-10   left hip per Dr, [name]  . LUMBAR LAMINECTOMY/DECOMPRESSION MICRODISCECTOMY  11/07/2011   Procedure: LUMBAR LAMINECTOMY/DECOMPRESSION MICRODISCECTOMY;  Surgeon: Johnn Hai, MD;  Location: [facility];  Service: Orthopedics;  Laterality: N/A;  Decompression of the L4 - L5 Central (X-ray)   . partial removal of scrotum    . TONSILLECTOMY    . TOTAL HIP REVISION Left 11/05/2012   Procedure: Conversion of a Bipolar to a Left Total Hip Arthroplasty;  Surgeon: [name], MD;  Location: [facility];  Service: Orthopedics;  Laterality: Left;  Conversion of a Bipolar to a Left Total Hip Arthroplasty   Social History:   reports that he has never smoked. He has never used smokeless tobacco. He reports that he does not drink alcohol or use drugs.  Family History  Problem Relation Age of Onset  . Tuberculosis      Medications:   Medication List       Accurate as of 06/01/16 11:48 AM. Always use your most recent med list.          aspirin 81 MG chewable tablet Chew 1 tablet (81 mg total) by mouth daily.   cyanocobalamin 1000 MCG/ML injection Commonly known as:  (VITAMIN B-12) Inject 1,000 mcg into the muscle every 30 (thirty) days.   cyclobenzaprine 10 MG tablet Commonly known as:  FLEXERIL TAKE 1 TABLET 3 TIMES DAILYAS NEEDED FOR MUSCLE SPASMS   Ferrous Sulfate 220 (44 Fe) MG/5ML Liqd TAKE 7 MLS BY MOUTH DAILY   finasteride 5 MG tablet Commonly known as:  PROSCAR TAKE 1 TABLET DAILY   flecainide 50 MG tablet Commonly known as:  TAMBOCOR Take 1 tablet (50 mg total) by mouth every 12 (twelve) hours.   gabapentin 400 MG capsule Commonly known as:  NEURONTIN TAKE 1 CAPSULE 4 TIMES     DAILY   HYDROcodone-acetaminophen 5-325 MG tablet Commonly known as:  NORCO/VICODIN Take 1 tablet by mouth every 6 (six) hours as needed for moderate pain.   insulin glargine 100 UNIT/ML injection Commonly known as:  LANTUS Inject 0.2 mLs (20 Units total) into the skin 2 (two)  times daily.   lidocaine 5 % Commonly known as:  LIDODERM Place 1 patch onto the skin daily. Remove & Discard patch within 12 hours or as directed by MD   losartan 25 MG tablet Commonly known as:  COZAAR Take 1 tablet (25 mg total) by mouth daily.   metFORMIN 1000 MG tablet Commonly known as:  GLUCOPHAGE TAKE 1 TABLET TWICE A DAY  WITH MEALS   metoprolol tartrate 25 MG tablet Commonly known as:  LOPRESSOR Take 0.5 tablets (12.5 mg total) by mouth 2 (two) times daily.   NUTRITIONAL SUPPLEMENT Liqd Take 120 mLs by mouth 2 (two) times daily. SF MedPass for nutritional support   polyethylene glycol packet Commonly known as:  MIRALAX / GLYCOLAX Take 17 g by mouth daily as needed for mild constipation.   rivaroxaban 20 MG Tabs tablet Commonly known as:  XARELTO Take 1 tablet (20 mg total) by mouth daily with supper.   sitaGLIPtin 100 MG tablet  Commonly known as:  JANUVIA Take 1 tablet (100 mg total) by mouth daily.       Immunizations: Immunization History  Administered Date(s) Administered  . Influenza Split 04/25/2011, 03/27/2012  . Influenza Whole 05/02/2007, 04/22/2008, 04/25/2009, 03/24/2010  . Influenza, High Dose Seasonal PF 03/25/2015, 04/03/2016  . Influenza,inj,Quad PF,36+ Mos 03/24/2013, 03/25/2014  . Pneumococcal Conjugate-13 07/14/2014  . Tdap 07/14/2014  . Zoster 07/28/2014     Physical Exam: Vitals:   06/01/16 1143  BP: (!) 144/63  Pulse: 62  Resp: 18  Temp: 97.6 F (36.4 C)  TempSrc: Oral  SpO2: 98%  Weight: 150 lb (68 kg)  Height: 5\' 10"  (1.778 m)   Body mass index is 21.52 kg/m.  General- elderly male, frail, in no acute distress Head- normocephalic, atraumatic Nose- no maxillary or frontal sinus tenderness, no nasal discharge Throat- moist mucus membrane , white coating on his tongue Eyes- PERRLA, EOMI, no pallor, no icterus Neck- no cervical lymphadenopathy Cardiovascular- normal s1,s2, no murmur Respiratory- bilateral clear to  auscultation, no wheeze, no rhonchi, no crackles, no use of accessory muscles Abdomen- bowel sounds present, soft, non tender, condom catheter in place Musculoskeletal- able to move all 4 extremities, no leg edema, generalized weakness Neurological- alert and oriented to person, place and time Skin- warm and dry, bruising to both his arms, senile purpura present, pacemaker site with Steri-Strips in place clean and dry Psychiatry- normal mood and affect    Labs reviewed: Basic Metabolic Panel:  Recent Labs  05/22/16 0511  05/26/16 0357 05/26/16 0358 05/27/16 0540 05/30/16 0254  NA 133*  < >  --  134* 132* 130*  K 3.7  < >  --  4.5 4.7 4.1  CL 99*  < >  --  99* 99* 99*  CO2 23  < >  --  27 27 24   GLUCOSE 217*  < >  --  268* 206* 65  BUN 20  < >  --  29* 26* 23*  CREATININE 0.72  < >  --  0.80 0.78 0.93  CALCIUM 8.5*  < >  --  8.7* 8.7* 8.4*  MG 1.8  --  2.0  --   --  1.9  < > = values in this interval not displayed. Liver Function Tests:  Recent Labs  05/26/16 0358 05/27/16 0540 05/30/16 0254  AST 112* 90* 119*  ALT 121* 110* 88*  ALKPHOS 668* 666* 708*  BILITOT 4.1* 3.6* 3.0*  PROT 6.4* 6.2* 5.7*  ALBUMIN 3.0* 2.9* 2.5*    Recent Labs  05/09/16 1627  LIPASE 27.0  AMYLASE 40   No results for input(s): AMMONIA in the last 8760 hours. CBC:  Recent Labs  05/09/16 1627 05/17/16 1227 05/18/16 0357  05/27/16 0540 05/28/16 0420 05/30/16 0254  WBC 6.9 20.2 Repeated and verified X2.* 8.8  < > 8.7 10.6* 8.4  NEUTROABS 3.8 17.2* 7.0  --   --   --   --   HGB 9.7* 9.4* 8.0*  < > 8.2* 8.7* 8.3*  HCT 28.8* 28.0* 23.5*  < > 23.5* 24.6* 24.1*  MCV 92.5 92.6 91.8  < > 91.4 91.8 90.9  PLT 297.0 348.0 272  < > 221 193 96*  < > = values in this interval not displayed. Cardiac Enzymes: No results for input(s): CKTOTAL, CKMB, CKMBINDEX, TROPONINI in the last 8760 hours. BNP: Invalid input(s): POCBNP CBG:  Recent Labs  05/30/16 0637 05/30/16 1230 05/30/16 1757    GLUCAP 75 124* 126*  Radiological Exams: Dg Chest 2 View  Result Date: 05/29/2016 CLINICAL DATA:  82 year old male. Cardiac device. Initial encounter. EXAM: CHEST  2 VIEW COMPARISON:  04/13/2015 FINDINGS: Sequential pacemaker enters from the left with leads in the region of the right atrium (lateral wall region) and right ventricle. Cardiomegaly. Central pulmonary vascular prominence.  No frank pulmonary edema. Biapical pleural thickening without associated bony destruction similar to prior exams. No pneumothorax. Calcified slightly tortuous aorta. Postsurgical changes cervical spine. Right acromioclavicular joint degenerative changes. No plain film evidence of pulmonary malignancy. IMPRESSION: Sequential pacemaker enters from the left with leads in the region of the right atrium and right ventricle. Cardiomegaly. Central pulmonary vascular prominence.  No frank pulmonary edema. Electronically Signed   By: Genia Del M.D.   On: 05/29/2016 07:21   US Abdomen Complete  Result Date: 05/23/2016 CLINICAL DATA:  Recent common bile duct calculus EXAM: ABDOMEN ULTRASOUND COMPLETE COMPARISON:  ERCP May 22, 2016. FINDINGS: Gallbladder: No gallstones are evident. The gallbladder wall is thickened and edematous. There is no pericholecystic fluid. No sonographic Murphy sign noted by sonographer. Common bile duct: Diameter: 13 mm, dilated. There is moderate edema in the common hepatic and common bile duct. No mass or calculus is seen by ultrasound currently in the biliary ductal system. A small amount of air in the intrahepatic ducts may be due to the recent common bile duct manipulation. The intrahepatic biliary ducts do not appear appreciably dilated by ultrasound. Liver: No focal lesion identified. Liver echogenicity is overall increased. IVC: No abnormality visualized. Pancreas: Visualized portion unremarkable. Portions of the pancreatic tail are obscured by gas. Spleen: Size and appearance within normal  limits. Right Kidney: Length: 12.6 cm. Echogenicity within normal limits. No mass or hydronephrosis visualized. There is renal cortical thinning. Left Kidney: Length: 11.6 cm. Echogenicity within normal limits. No mass or hydronephrosis visualized. There is renal cortical thinning. Abdominal aorta: No aneurysm visualized. Other findings: No demonstrable ascites. IMPRESSION: Gallbladder wall is thickened and edematous. No gallstones seen. A degree of acalculus cholecystitis must be of concern. Common bile duct remains dilated without mass or calculus evident by ultrasound. There is edema in the wall of the common bile duct. A small amount of air in the intrahepatic biliary ductal system is likely secondary to the recent biliary ductal manipulation. Portions of pancreas obscured by gas. Visualized portions of pancreas appear normal. Renal cortical thinning bilaterally may be a function of age or may be seen with medical renal disease. Kidneys otherwise appear unremarkable. Study otherwise unremarkable. Electronically Signed   By: Lowella Grip III M.D.   On: 05/23/2016 15:19   Ct Abdomen Pelvis W Contrast  Result Date: 05/11/2016 CLINICAL DATA:  Jaundice for about 2 weeks, abdominal swelling EXAM: CT ABDOMEN AND PELVIS WITH CONTRAST TECHNIQUE: Multidetector CT imaging of the abdomen and pelvis was performed using the standard protocol following bolus administration of intravenous contrast. CONTRAST:  116mL ISOVUE-300 IOPAMIDOL (ISOVUE-300) INJECTION 61% COMPARISON:  None. FINDINGS: Lower chest: Lung bases shows mild peripheral interstitial prominence probable probable chronic in nature. No segmental infiltrate. Hepatobiliary: There is intrahepatic biliary ductal dilatation. No focal hepatic mass. No calcified gallstones are noted within gallbladder. There is significant CBD dilatation up to 1.8 cm. There is abrupt narrowing of CBD at the level of ampulla There is a polypoid lesion at the level of ampulla  within duodenum measures about 1.5 cm axial image 47. Ampullary mass cannot be excluded. Further correlation with ERCP is recommended. Pancreas: There is fatty replaced pancreas.  No focal pancreatic mass. No dilatation of pancreatic duct. Spleen: Enhanced spleen is unremarkable. Adrenals/Urinary Tract: No adrenal gland mass. Mild for renal cortical thinning and lobulated contour probable due to atrophy. Bilateral renal symmetrical enhancement. Delayed renal images shows bilateral renal symmetrical excretion. Bilateral visualized proximal ureter is unremarkable. Stomach/Bowel: There is no small bowel obstruction. Oral contrast material was given to the patient. Some liquid stool noted in right colon. No pericecal inflammation. The terminal ileum is unremarkable. Liquid stool noted in transverse colon and descending colon. No evidence of obstructing mass. Moderate stool noted within redundant sigmoid colon. Moderate stool noted within rectum. No definite colonic obstruction. No colitis or acute diverticulitis. Vascular/Lymphatic: Mild atherosclerotic calcifications of distal abdominal aorta. No aortic aneurysm. No retroperitoneal or mesenteric adenopathy. Reproductive: Prostate gland is unremarkable. No bladder filling defects are noted. There are metallic artifacts from left hip prosthesis. Other: No ascites or free abdominal air.  No inguinal adenopathy. Musculoskeletal: No destructive bony lesions are noted. Sagittal images of the spine shows diffuse osteopenia. Degenerative changes lumbar spine. There is mild to moderate compression deformity L1 vertebral body of indeterminate age. Clinical correlation is necessary. IMPRESSION: 1. There is intrahepatic biliary ductal dilatation. No focal hepatic mass. No calcified gallstones are noted within gallbladder. There is significant CBD dilatation up to 1.8 cm. There is abrupt narrowing of CBD at the level of ampulla There is a polypoid lesion at the level of ampulla  within duodenum measures about 1.5 cm axial image 47. Ampullary mass cannot be excluded. Further correlation with ERCP is recommended. 2. Moderate colonic stool in distal colon. No definite evidence of acute colitis or diverticulitis. 3. No small bowel obstruction. 4. Metallic artifacts from left hip prosthesis. Compression deformity L1 vertebral body of indeterminate age. Clinical correlation is necessary. Electronically Signed   By: Lahoma Crocker M.D.   On: 05/11/2016 16:58   Dg Ercp Biliary & Pancreatic Ducts  Result Date: 05/22/2016 CLINICAL DATA:  Gallstone EXAM: ERCP TECHNIQUE: Multiple spot images obtained with the fluoroscopic device and submitted for interpretation post-procedure. FLUOROSCOPY TIME:  Fluoroscopy Time:  5 minutes and 12 seconds Radiation Exposure Index (if provided by the fluoroscopic device): 87 mGy Number of Acquired Spot Images: 6 COMPARISON:  None. FINDINGS: The common bile duct is markedly dilated. There is a filling defect in the distal common bile duct. Contrast also fills the pancreatic duct. IMPRESSION: See above. These images were submitted for radiologic interpretation only. Please see the procedural report for the amount of contrast and the fluoroscopy time utilized. Electronically Signed   By: Marybelle Killings M.D.   On: 05/22/2016 16:03    Assessment/Plan  Generalized weakness From physical deconditioning. Will have her work with physical therapy and occupational therapy team for gait training and strengthening exercises. Fall precautions to be taken.  Biliary duct obstruction With jaundice. Status post ERCP with extraction of the stone. No stent has been placed. Has completed his course of antibiotics. Monitor LFT given deranged liver enzymes.  Atrial fibrillation Rate controlled. He is now status post pacemaker. Has follow-up with cardiology on 27th of November 2017. Continue flecainide 50 mg twice a day and metoprolol tartrate 12.5 mg twice a day. Continue Xarelto 20  mg daily for stroke prophylaxis.  Dysphagia Aspiration precautions to be taken. Medications to be provided with applesauce or pudding per patient request.  Ampullary lesion Status post biopsy with biopsy results negative for malignancy.  Hyponatremia Monitor BMP  Hypertension Monitor blood pressure reading. Continue his losartan and metoprolol.  Type 2 diabetes mellitus with neuropathy Monitor blood sugar reading. Continue metformin thousand milligrams twice a day, Januvia 100 mg daily and Lantus 20 units twice a day. Continue his gabapentin.   Anemia of chronic disease Continue ferrous sulfate supplement. Monitor CBC.  Chronic low back pain Continue lidocaine patch and Norco on a needed basis. Continue cyclobenzaprine 3 times a day. Monitor clinically. back precautions to be taken.     Goals of care: short term rehabilitation   Labs/tests ordered: cbc, bmp  Family/ staff Communication: reviewed care plan with patient and nursing supervisor    Blanchie Serve, MD Internal Medicine [place], [place] [address] Cell Phone (Monday-Friday 8 am - 5 pm): [phone] On Call: [phone] and follow prompts after 5 pm and on weekends Office Phone: [phone] Office Fax: [phone]

## 2016-06-05 LAB — BASIC METABOLIC PANEL
BUN: 23 mg/dL — AB (ref 4–21)
Creatinine: 0.8 mg/dL (ref 0.6–1.3)
GLUCOSE: 108 mg/dL
POTASSIUM: 4.9 mmol/L (ref 3.4–5.3)
Sodium: 139 mmol/L (ref 137–147)

## 2016-06-05 LAB — CBC AND DIFFERENTIAL
HEMATOCRIT: 26 % — AB (ref 41–53)
Hemoglobin: 8.4 g/dL — AB (ref 13.5–17.5)
Neutrophils Absolute: 6 /uL
Platelets: 219 10*3/uL (ref 150–399)
WBC: 8.1 10*3/mL

## 2016-06-11 ENCOUNTER — Ambulatory Visit: Payer: Medicare Other

## 2016-06-11 ENCOUNTER — Non-Acute Institutional Stay (SKILLED_NURSING_FACILITY): Payer: Medicare Other | Admitting: Adult Health

## 2016-06-11 ENCOUNTER — Encounter: Payer: Self-pay | Admitting: Adult Health

## 2016-06-11 DIAGNOSIS — E118 Type 2 diabetes mellitus with unspecified complications: Secondary | ICD-10-CM | POA: Diagnosis not present

## 2016-06-11 DIAGNOSIS — Z794 Long term (current) use of insulin: Secondary | ICD-10-CM

## 2016-06-11 NOTE — Progress Notes (Signed)
Patient ID: Andrew Norton, male   DOB: June 03, 1927, 82 y.o.   MRN: [id number]    DATE:  06/11/16  MRN:  [id number]  BIRTHDAY: 1926-11-03  Facility:  Nursing Home Location:  [place] Room Number: [id number]  LEVEL OF CARE:  SNF [phone])  Contact Information    Name Relation Home Work Mobile   Spreen,Helen Spouse [phone]     Sedam,Scott Son [phone]  [phone]   [place] Father [phone]  [phone]       Code Status History    Date Active Date Inactive Code Status Order ID Comments User Context   05/17/2016  6:28 PM 05/30/2016 10:34 PM Full Code [id number]  Florencia Reasons, MD Inpatient   04/13/2015 12:44 AM 04/16/2015  3:42 PM Full Code [id number]  Allyne Gee, MD Inpatient   11/05/2012  7:13 PM 11/08/2012  4:53 PM Full Code [id number]  [name], MD Inpatient       Chief Complaint  Patient presents with  . Acute Visit    Hypoglycemia    HISTORY OF PRESENT ILLNESS:  This is an 82 year old male who has CBGs 108, 186, 191, 95, 161, 164, 145, 181. He had an episode of CBG 44 and was given glucogel and another day CBG was 54 so glucogel was given. He currently takes Metformin, Januvia, Glipizide and Lantus. Patient verbalized that he feels week when his CBG is low.  He has  PMH of hypertension, diabetes mellitus type 2 and neuropathy. He was having new onset of jaundice with an abdominal CT scan which showed intrahepatic and biliary ductal dilation. ERCP done and stone removal on 05/22/16. Biopsy was negative for malignancy. He then started having pauses on telemetry. Cardiology was consulted and was started on flecainide and placed on 05/28/16. He is currently having a short-term rehabilitation.   PAST MEDICAL HISTORY:  Past Medical History:  Diagnosis Date  . Anemia   . Arthritis    hips  . Atrial fibrillation ([place])   . Carpal tunnel syndrome    left leg sciatica  . Diabetes mellitus   . Hearing loss    left ear greater loss  .  History of shingles    past hx. left hand  . Hyperlipidemia   . Hypertension    EKG, chest x ray 11/15/10 EPIC,  clearance Dr Sharlene Motts on chart/ pt states on meds to "prevent hypertension from diabetes"  . Paget's disease    scrotum  . Paroxysmal atrial fibrillation ([place]) 05/18/2016  . Positive PPD, treated      CURRENT MEDICATIONS: Reviewed  Patient's Medications  New Prescriptions   GLIPIZIDE (GLUCOTROL) 10 MG TABLET    Take 1 tablet (10 mg total) by mouth 2 (two) times daily before a meal.  Previous Medications   ASPIRIN 81 MG CHEWABLE TABLET    Chew 1 tablet (81 mg total) by mouth daily.   CYANOCOBALAMIN (,VITAMIN B-12,) 1000 MCG/ML INJECTION    Inject 1,000 mcg into the muscle every 30 (thirty) days.   CYCLOBENZAPRINE (FLEXERIL) 10 MG TABLET    TAKE 1 TABLET 3 TIMES DAILYAS NEEDED FOR MUSCLE SPASMS   FERROUS SULFATE 220 (44 FE) MG/5ML LIQD    TAKE 7 MLS BY MOUTH DAILY   FINASTERIDE (PROSCAR) 5 MG TABLET    TAKE 1 TABLET DAILY   HYDROCODONE-ACETAMINOPHEN (NORCO/VICODIN) 5-325 MG TABLET    Take 1 tablet by mouth every 6 (six) hours as needed for moderate pain.   LIDOCAINE (LIDODERM) 5 %  Place 1 patch onto the skin daily. Remove & Discard patch within 12 hours or as directed by MD   LOSARTAN (COZAAR) 25 MG TABLET    Take 1 tablet (25 mg total) by mouth daily.   METFORMIN (GLUCOPHAGE) 1000 MG TABLET    TAKE 1 TABLET TWICE A DAY  WITH MEALS   NUTRITIONAL SUPPLEMENT LIQD    Take 120 mLs by mouth 2 (two) times daily. SF MedPass for nutritional support   POLYETHYLENE GLYCOL (MIRALAX / GLYCOLAX) PACKET    Take 17 g by mouth daily as needed for mild constipation.  Modified Medications   Modified Medication Previous Medication   FLECAINIDE (TAMBOCOR) 50 MG TABLET flecainide (TAMBOCOR) 50 MG tablet      Take 1 tablet (50 mg total) by mouth every 12 (twelve) hours.    Take 1 tablet (50 mg total) by mouth every 12 (twelve) hours.   GABAPENTIN (NEURONTIN) 400 MG CAPSULE gabapentin (NEURONTIN) 400  MG capsule      TAKE 1 CAPSULE 4 TIMES     DAILY    TAKE 1 CAPSULE 4 TIMES     DAILY   METOPROLOL TARTRATE (LOPRESSOR) 25 MG TABLET metoprolol tartrate (LOPRESSOR) 25 MG tablet      Take 0.5 tablets (12.5 mg total) by mouth 2 (two) times daily.    Take 0.5 tablets (12.5 mg total) by mouth 2 (two) times daily.   RIVAROXABAN (XARELTO) 20 MG TABS TABLET rivaroxaban (XARELTO) 20 MG TABS tablet      Take 1 tablet (20 mg total) by mouth daily with supper.    Take 1 tablet (20 mg total) by mouth daily with supper.  Discontinued Medications   INSULIN GLARGINE (LANTUS) 100 UNIT/ML INJECTION    Inject 0.2 mLs (20 Units total) into the skin 2 (two) times daily.   INSULIN GLARGINE (LANTUS) 100 UNIT/ML INJECTION    Inject 10 Units into the skin 2 (two) times daily.   SITAGLIPTIN (JANUVIA) 100 MG TABLET    Take 1 tablet (100 mg total) by mouth daily.     Allergies  Allergen Reactions  . Penicillins Hives         REVIEW OF SYSTEMS:  GENERAL: no change in appetite, no fatigue, no weight changes, no fever, chills or weakness EYES: Denies change in vision, dry eyes, eye pain, itching or discharge EARS: Denies change in hearing, ringing in ears, or earache NOSE: Denies nasal congestion or epistaxis MOUTH and THROAT: Denies oral discomfort, gingival pain or bleeding, pain from teeth or hoarseness   RESPIRATORY: no cough, SOB, DOE, wheezing, hemoptysis CARDIAC: no chest pain, edema or palpitations GI: no abdominal pain, diarrhea, constipation, heart burn, nausea or vomiting GU: Denies dysuria, frequency, hematuria, incontinence, or discharge PSYCHIATRIC: Denies feeling of depression or anxiety. No report of hallucinations, insomnia, paranoia, or agitation    PHYSICAL EXAMINATION  GENERAL APPEARANCE: Well nourished. In no acute distress. Normal body habitus SKIN:  Skin is warm and dry. Left chest has pacemaker surgical site with steri-strips HEAD: Normal in size and contour. No evidence of  trauma EYES: Lids open and close normally. No blepharitis, entropion or ectropion. PERRL. Conjunctivae are clear and sclerae are white. Lenses are without opacity EARS: Pinnae are normal. Patient hears normal voice tunes of the examiner MOUTH and THROAT: Lips are without lesions. Oral mucosa is moist and without lesions. Tongue is normal in shape, size, and color and without lesions NECK: supple, trachea midline, no neck masses, no thyroid tenderness, no thyromegaly LYMPHATICS:  no LAN in the neck, no supraclavicular LAN RESPIRATORY: breathing is even & unlabored, BS CTAB CARDIAC: RRR, no murmur,no extra heart sounds, no edema GI: abdomen soft, normal BS, no masses, no tenderness, no hepatomegaly, no splenomegaly GU:  Has condom catheter draining to urine bag with yellowish urine PSYCHIATRIC: Alert and oriented X 3. Affect and behavior are appropriate  LABS/RADIOLOGY: Labs reviewed: Basic Metabolic Panel:  Recent Labs  05/22/16 0511  05/26/16 0357 05/26/16 0358 05/27/16 0540 05/30/16 0254 06/05/16  NA 133*  < >  --  134* 132* 130* 139  K 3.7  < >  --  4.5 4.7 4.1 4.9  CL 99*  < >  --  99* 99* 99*  --   CO2 23  < >  --  27 27 24   --   GLUCOSE 217*  < >  --  268* 206* 65  --   BUN 20  < >  --  29* 26* 23* 23*  CREATININE 0.72  < >  --  0.80 0.78 0.93 0.8  CALCIUM 8.5*  < >  --  8.7* 8.7* 8.4*  --   MG 1.8  --  2.0  --   --  1.9  --   < > = values in this interval not displayed. Liver Function Tests:  Recent Labs  05/26/16 0358 05/27/16 0540 05/30/16 0254  AST 112* 90* 119*  ALT 121* 110* 88*  ALKPHOS 668* 666* 708*  BILITOT 4.1* 3.6* 3.0*  PROT 6.4* 6.2* 5.7*  ALBUMIN 3.0* 2.9* 2.5*    Recent Labs  05/09/16 1627  LIPASE 27.0  AMYLASE 40    CBC:  Recent Labs  05/17/16 1227 05/18/16 0357  05/27/16 0540 05/28/16 0420 05/30/16 0254 06/05/16  WBC 20.2 Repeated and verified X2.* 8.8  < > 8.7 10.6* 8.4 8.1  NEUTROABS 17.2* 7.0  --   --   --   --  6  HGB 9.4*  8.0*  < > 8.2* 8.7* 8.3* 8.4*  HCT 28.0* 23.5*  < > 23.5* 24.6* 24.1* 26*  MCV 92.6 91.8  < > 91.4 91.8 90.9  --   PLT 348.0 272  < > 221 193 96* 219  < > = values in this interval not displayed.  Lipid Panel:  Recent Labs  10/05/15 1112  HDL 43.00    CBG:  Recent Labs  05/30/16 0637 05/30/16 1230 05/30/16 1757  GLUCAP 75 124* 126*       ASSESSMENT/PLAN:  Diabetes mellitus, type II   -  he has been having hypoglycemia episodes and was given gluco gel  X 3 episodes; discontinue Januvia, decrease Lantus from 20 units twice a day to 10 units subcutaneous twice a day and continue metformin 1000 mg 1 tab by mouth twice a day    Durenda Age, NP [place]

## 2016-06-15 ENCOUNTER — Non-Acute Institutional Stay (SKILLED_NURSING_FACILITY): Payer: Medicare Other | Admitting: Adult Health

## 2016-06-15 ENCOUNTER — Encounter: Payer: Self-pay | Admitting: Adult Health

## 2016-06-15 DIAGNOSIS — K831 Obstruction of bile duct: Secondary | ICD-10-CM

## 2016-06-15 DIAGNOSIS — R5381 Other malaise: Secondary | ICD-10-CM

## 2016-06-15 DIAGNOSIS — G8929 Other chronic pain: Secondary | ICD-10-CM

## 2016-06-15 DIAGNOSIS — M545 Low back pain: Secondary | ICD-10-CM

## 2016-06-15 DIAGNOSIS — I10 Essential (primary) hypertension: Secondary | ICD-10-CM

## 2016-06-15 DIAGNOSIS — I495 Sick sinus syndrome: Secondary | ICD-10-CM | POA: Diagnosis not present

## 2016-06-15 DIAGNOSIS — G629 Polyneuropathy, unspecified: Secondary | ICD-10-CM | POA: Diagnosis not present

## 2016-06-15 DIAGNOSIS — E118 Type 2 diabetes mellitus with unspecified complications: Secondary | ICD-10-CM | POA: Diagnosis not present

## 2016-06-15 DIAGNOSIS — Z794 Long term (current) use of insulin: Secondary | ICD-10-CM

## 2016-06-15 DIAGNOSIS — I480 Paroxysmal atrial fibrillation: Secondary | ICD-10-CM

## 2016-06-15 DIAGNOSIS — K5909 Other constipation: Secondary | ICD-10-CM

## 2016-06-15 DIAGNOSIS — D62 Acute posthemorrhagic anemia: Secondary | ICD-10-CM | POA: Diagnosis not present

## 2016-06-15 DIAGNOSIS — E538 Deficiency of other specified B group vitamins: Secondary | ICD-10-CM | POA: Diagnosis not present

## 2016-06-15 NOTE — Progress Notes (Signed)
Patient ID: Andrew Norton, male   DOB: 1927-04-28, 82 y.o.   MRN: [id number]    DATE:    06/15/2016   MRN:  [id number]  BIRTHDAY: Mar 19, 1927  Facility:  Nursing Home Location:  [place] and [place] Room Number: [id number]  LEVEL OF CARE:  SNF [phone])  Contact Information    Name [place] Spouse [phone]     Tomassetti,Scott Son [phone]  [phone]   [place] Father [phone]  [phone]       Code Status History    Date Active Date Inactive Code Status Order ID Comments User Context   05/17/2016  6:28 PM 05/30/2016 10:34 PM Full Code [id number]  Florencia Reasons, MD Inpatient   04/13/2015 12:44 AM 04/16/2015  3:42 PM Full Code [id number]  Allyne Gee, MD Inpatient   11/05/2012  7:13 PM 11/08/2012  4:53 PM Full Code [id number]  [name], MD Inpatient       Chief Complaint  Patient presents with  . Discharge Note    HISTORY OF PRESENT ILLNESS:  This is an 82 year old male who has  PMH of hypertension, diabetes mellitus type 2 and neuropathy. He was having new onset of jaundice with an abdominal CT scan which showed intrahepatic and biliary ductal dilation. ERCP done and stone removal on 05/22/16. Biopsy was negative for malignancy. He then started having pauses on telemetry. Cardiology was consulted and was started on flecainide and placed on 05/28/16.  He was visited in the room with wife and son at bedside. Patient requested that this MiraLAX be given as needed instead of routinely. Wife said that he is usually constipated but after having the surgery, he started having regular bowel movements.  He has been admitted for a short-term rehabilitation.   PAST MEDICAL HISTORY:  Past Medical History:  Diagnosis Date  . Anemia   . Arthritis    hips  . Atrial fibrillation ([place])   . Carpal tunnel syndrome    left leg sciatica  . Diabetes mellitus   . Hearing loss    left ear greater loss  . History of shingles    past hx.  left hand  . Hyperlipidemia   . Hypertension    EKG, chest x ray 11/15/10 EPIC,  clearance Dr Sharlene Motts on chart/ pt states on meds to "prevent hypertension from diabetes"  . Paget's disease    scrotum  . Paroxysmal atrial fibrillation ([place]) 05/18/2016  . Positive PPD, treated      CURRENT MEDICATIONS: Reviewed  Patient's Medications  New Prescriptions   METOPROLOL TARTRATE (LOPRESSOR) 25 MG TABLET    Take 1 tablet (25 mg total) by mouth 2 (two) times daily.  Previous Medications   CYANOCOBALAMIN (,VITAMIN B-12,) 1000 MCG/ML INJECTION    Inject 1,000 mcg into the muscle every 30 (thirty) days.   GLUCOSE BLOOD (TRUE METRIX BLOOD GLUCOSE TEST VI)    by In Vitro route 2 (two) times daily. Diagnosis code is E 11.9   LIDOCAINE (LIDODERM) 5 %    Place 1 patch onto the skin daily. Remove & Discard patch within 12 hours or as directed by MD   NUTRITIONAL SUPPLEMENT LIQD    Take 120 mLs by mouth 2 (two) times daily. SF MedPass for nutritional support   POLYETHYLENE GLYCOL (MIRALAX / GLYCOLAX) PACKET    Take 17 g by mouth daily as needed for mild constipation.  Modified Medications   Modified Medication Previous Medication   CYCLOBENZAPRINE (  FLEXERIL) 10 MG TABLET cyclobenzaprine (FLEXERIL) 10 MG tablet      TAKE 1 TABLET 3 TIMES DAILYAS NEEDED FOR MUSCLE SPASMS    TAKE 1 TABLET 3 TIMES DAILYAS NEEDED FOR MUSCLE SPASMS   FERROUS SULFATE 220 (44 FE) MG/5ML LIQD Ferrous Sulfate 220 (44 Fe) MG/5ML LIQD      Take 7 mLs by mouth daily. TAKE 7 MLS BY MOUTH DAILY    TAKE 7 MLS BY MOUTH DAILY   FINASTERIDE (PROSCAR) 5 MG TABLET finasteride (PROSCAR) 5 MG tablet      TAKE 1 TABLET DAILY    TAKE 1 TABLET DAILY   FLECAINIDE (TAMBOCOR) 50 MG TABLET flecainide (TAMBOCOR) 50 MG tablet      Take 1 tablet (50 mg total) by mouth every 12 (twelve) hours.    Take 1 tablet (50 mg total) by mouth every 12 (twelve) hours.   GABAPENTIN (NEURONTIN) 400 MG CAPSULE gabapentin (NEURONTIN) 400 MG capsule      TAKE 1 CAPSULE 4  TIMES     DAILY    TAKE 1 CAPSULE 4 TIMES     DAILY   GLIPIZIDE (GLUCOTROL) 10 MG TABLET glipiZIDE (GLUCOTROL) 10 MG tablet      Take 0.5 tablets (5 mg total) by mouth 2 (two) times daily.    TAKE 1 TABLET TWICE A DAY  BEFORE MEALS   HYDROCODONE-ACETAMINOPHEN (NORCO/VICODIN) 5-325 MG TABLET HYDROcodone-acetaminophen (NORCO/VICODIN) 5-325 MG tablet      Take 1 tablet by mouth every 6 (six) hours as needed for moderate pain.    Take 1 tablet by mouth every 6 (six) hours as needed for moderate pain.   LOSARTAN (COZAAR) 50 MG TABLET losartan (COZAAR) 50 MG tablet      TAKE 1 TABLET EVERY MORNING    TAKE 1 TABLET EVERY MORNING   METFORMIN (GLUCOPHAGE) 1000 MG TABLET metFORMIN (GLUCOPHAGE) 1000 MG tablet      TAKE 1 TABLET TWICE A DAY  WITH MEALS    TAKE 1 TABLET TWICE A DAY  WITH MEALS   RIVAROXABAN (XARELTO) 20 MG TABS TABLET rivaroxaban (XARELTO) 20 MG TABS tablet      Take 1 tablet (20 mg total) by mouth daily with supper.    Take 1 tablet (20 mg total) by mouth daily with supper.   SITAGLIPTIN (JANUVIA) 100 MG TABLET sitaGLIPtin (JANUVIA) 100 MG tablet      Take 1 tablet (100 mg total) by mouth daily.    Take 1 tablet (100 mg total) by mouth daily.  Discontinued Medications   ASPIRIN 81 MG CHEWABLE TABLET    Chew 1 tablet (81 mg total) by mouth daily.   FERROUS SULFATE 220 (44 FE) MG/5ML LIQD    TAKE 7 MLS BY MOUTH DAILY   FLECAINIDE (TAMBOCOR) 50 MG TABLET    Take 1 tablet (50 mg total) by mouth every 12 (twelve) hours.   HYDROCODONE-ACETAMINOPHEN (NORCO/VICODIN) 5-325 MG TABLET    Take 1 tablet by mouth every 6 (six) hours as needed for moderate pain.   INSULIN GLARGINE (LANTUS) 100 UNIT/ML INJECTION    Inject 10 Units into the skin 2 (two) times daily.   LOSARTAN (COZAAR) 25 MG TABLET    Take 1 tablet (25 mg total) by mouth daily.   METOPROLOL TARTRATE (LOPRESSOR) 25 MG TABLET    Take 0.5 tablets (12.5 mg total) by mouth 2 (two) times daily.   RIVAROXABAN (XARELTO) 20 MG TABS TABLET    Take 1  tablet (20 mg total) by mouth  daily with supper.     Allergies  Allergen Reactions  . Penicillins Hives         REVIEW OF SYSTEMS:  GENERAL: no change in appetite, no fatigue, no weight changes, no fever, chills or weakness EYES: Denies change in vision, dry eyes, eye pain, itching or discharge EARS: Denies change in hearing, ringing in ears, or earache NOSE: Denies nasal congestion or epistaxis MOUTH and THROAT: Denies oral discomfort, gingival pain or bleeding, pain from teeth or hoarseness   RESPIRATORY: no cough, SOB, DOE, wheezing, hemoptysis CARDIAC: no chest pain, edema or palpitations GI: no abdominal pain, diarrhea, constipation, heart burn, nausea or vomiting GU: Denies dysuria, frequency, hematuria, incontinence, or discharge PSYCHIATRIC: Denies feeling of depression or anxiety. No report of hallucinations, insomnia, paranoia, or agitation    PHYSICAL EXAMINATION  GENERAL APPEARANCE: Well nourished. In no acute distress. Normal body habitus SKIN:  Skin is warm and dry. Left chest has pacemaker surgical site with steri-strips HEAD: Normal in size and contour. No evidence of trauma EYES: Lids open and close normally. No blepharitis, entropion or ectropion. PERRL. Conjunctivae are clear and sclerae are white. Lenses are without opacity EARS: Pinnae are normal. Patient hears normal voice tunes of the examiner MOUTH and THROAT: Lips are without lesions. Oral mucosa is moist and without lesions. Tongue is normal in shape, size, and color and without lesions NECK: supple, trachea midline, no neck masses, no thyroid tenderness, no thyromegaly LYMPHATICS: no LAN in the neck, no supraclavicular LAN RESPIRATORY: breathing is even & unlabored, BS CTAB CARDIAC: RRR, no murmur,no extra heart sounds, no edema GI: abdomen soft, normal BS, no masses, no tenderness, no hepatomegaly, no splenomegaly GU:  Has condom catheter draining to urine bag with yellowish urine PSYCHIATRIC: Alert  and oriented X 3. Affect and behavior are appropriate  LABS/RADIOLOGY: Labs reviewed: Basic Metabolic Panel:  Recent Labs  05/22/16 0511  05/26/16 0357  05/27/16 0540 05/30/16 0254 06/05/16   NA 133*  < >  --   < > 132* 130* 139   K 3.7  < >  --   < > 4.7 4.1 4.9   CL 99*  < >  --   < > 99* 99*  --    CO2 23  < >  --   < > 27 24  --    GLUCOSE 217*  < >  --   < > 206* 65  --    BUN 20  < >  --   < > 26* 23* 23*   CREATININE 0.72  < >  --   < > 0.78 0.93 0.8   CALCIUM 8.5*  < >  --   < > 8.7* 8.4*  --    MG 1.8  --  2.0  --   --  1.9  --    < > = values in this interval not displayed. Liver Function Tests:  Recent Labs  05/27/16 0540 05/30/16 0254 11/29/16 1010  AST 90* 119* 17  ALT 110* 88* 13  ALKPHOS 666* 708* 107  BILITOT 3.6* 3.0* 0.5  PROT 6.2* 5.7* 7.0  ALBUMIN 2.9* 2.5* 4.0    Recent Labs  05/09/16 1627  LIPASE 27.0  AMYLASE 40    CBC:  Recent Labs  05/18/16 0357  05/28/16 0420 05/30/16 0254 06/05/16   WBC 8.8  < > 10.6* 8.4 8.1   NEUTROABS 7.0  --   --   --  6   HGB 8.0*  < >  8.7* 8.3* 8.4*   HCT 23.5*  < > 24.6* 24.1* 26*   MCV 91.8  < > 91.8 90.9  --    PLT 272  < > 193 96* 219   < > = values in this interval not displayed.  Lipid Panel:  Recent Labs  11/29/16 1010  HDL 47.00    CBG:  Recent Labs  05/30/16 0637 05/30/16 1230 05/30/16 1757  GLUCAP 75 124* 126*       ASSESSMENT/PLAN:  Physical deconditioning - for Home health PT and OT, for therapeutic strengthening exercises; fall precautions  Biliary stone with bile duct obstruction - ERCP was done that showed biliary stone on 05/22/16, stone was removed, stent was unable to be placed; biopsy result was negative for malignancy; completed course of ciprofloxacin + Flagyl; for home health PT and OT, for therapeutic strengthening exercises   Tachybradycardia syndrome - S/P PPM;  follow-up with cardiology   Paroxysmal atrial fibrillation -  S/P PPM, continue flecainide 50  mg 1 tab by mouth every 12 hours, metoprolol tartrate 25 mg 1/2 tab = 12.5 mg by mouth twice a day and Xarelto 20 mg 1 tab by mouth daily  Essential hypertension - well-controlled; continue metoprolol tartrate 25 mg 1/2 tab = 12.5 mg by mouth twice a day, losartan 25 mg 1 tab by mouth daily  Diabetes mellitus, type II   - continue Lantus 100 units/mL inject 10 units subcutaneous twice a day, metformin 1000 mg 1 tab by mouth twice a day, DC Januvia y and  DC glipizide ; check CBG twice a day and at bedtime  Lab Results  Component Value Date   HGBA1C 6.9 (H) 11/29/2016   Chronic constipation - verbalized having regular bowel movements after biliary stone removal, change MiraLAX to 17 mg by mouth daily when necessary  Anemia, acute blood loss - continue ferrous sulfate 220 mg/5 ML give 7 mL by mouth daily  Vitamin B12 deficiency - continue cyanocobalamin 1000 g/ML injection every 30 days  Low back pain - continue lidocaine 5% 1 patch to lower back daily and Norco 5/325 mg 1 tab by mouth every 6 hours when necessary for pain and cyclobenzaprine 10 mg 1 tab by mouth 3 times a day for muscle spasm  Neuropathy - Continue gabapentin 400 mg 1 capsule by mouth 4 times a day      I have filled out patient's discharge paperwork and written prescriptions.  Patient will receive home health PT, OT, Nursing and CNA.  DME provided:  None  Total discharge time: Greater than 30 minutes Greater than 50% was spent in counseling and coordination of care.   Discharge time involved coordination of the discharge process with social worker, nursing staff and therapy department. Medical justification for home health services verified.      Durenda Age, NP [employer] [phone]

## 2016-06-21 ENCOUNTER — Other Ambulatory Visit: Payer: Self-pay | Admitting: Family Medicine

## 2016-06-21 DIAGNOSIS — I480 Paroxysmal atrial fibrillation: Secondary | ICD-10-CM | POA: Diagnosis not present

## 2016-06-21 DIAGNOSIS — E785 Hyperlipidemia, unspecified: Secondary | ICD-10-CM | POA: Diagnosis not present

## 2016-06-21 DIAGNOSIS — M160 Bilateral primary osteoarthritis of hip: Secondary | ICD-10-CM | POA: Diagnosis not present

## 2016-06-21 DIAGNOSIS — Z7982 Long term (current) use of aspirin: Secondary | ICD-10-CM | POA: Diagnosis not present

## 2016-06-21 DIAGNOSIS — I10 Essential (primary) hypertension: Secondary | ICD-10-CM | POA: Diagnosis not present

## 2016-06-21 DIAGNOSIS — Z48815 Encounter for surgical aftercare following surgery on the digestive system: Secondary | ICD-10-CM | POA: Diagnosis not present

## 2016-06-21 DIAGNOSIS — I495 Sick sinus syndrome: Secondary | ICD-10-CM | POA: Diagnosis not present

## 2016-06-21 DIAGNOSIS — E43 Unspecified severe protein-calorie malnutrition: Secondary | ICD-10-CM | POA: Diagnosis not present

## 2016-06-21 DIAGNOSIS — Z7984 Long term (current) use of oral hypoglycemic drugs: Secondary | ICD-10-CM | POA: Diagnosis not present

## 2016-06-21 DIAGNOSIS — E1140 Type 2 diabetes mellitus with diabetic neuropathy, unspecified: Secondary | ICD-10-CM | POA: Diagnosis not present

## 2016-06-21 DIAGNOSIS — D649 Anemia, unspecified: Secondary | ICD-10-CM | POA: Diagnosis not present

## 2016-06-21 NOTE — Telephone Encounter (Signed)
Can we refill this? 

## 2016-06-22 DIAGNOSIS — Z48815 Encounter for surgical aftercare following surgery on the digestive system: Secondary | ICD-10-CM | POA: Diagnosis not present

## 2016-06-22 DIAGNOSIS — M160 Bilateral primary osteoarthritis of hip: Secondary | ICD-10-CM | POA: Diagnosis not present

## 2016-06-22 DIAGNOSIS — E1140 Type 2 diabetes mellitus with diabetic neuropathy, unspecified: Secondary | ICD-10-CM | POA: Diagnosis not present

## 2016-06-22 DIAGNOSIS — I495 Sick sinus syndrome: Secondary | ICD-10-CM | POA: Diagnosis not present

## 2016-06-22 DIAGNOSIS — I10 Essential (primary) hypertension: Secondary | ICD-10-CM | POA: Diagnosis not present

## 2016-06-22 DIAGNOSIS — I480 Paroxysmal atrial fibrillation: Secondary | ICD-10-CM | POA: Diagnosis not present

## 2016-06-25 DIAGNOSIS — I480 Paroxysmal atrial fibrillation: Secondary | ICD-10-CM | POA: Diagnosis not present

## 2016-06-25 DIAGNOSIS — E1140 Type 2 diabetes mellitus with diabetic neuropathy, unspecified: Secondary | ICD-10-CM | POA: Diagnosis not present

## 2016-06-25 DIAGNOSIS — M160 Bilateral primary osteoarthritis of hip: Secondary | ICD-10-CM | POA: Diagnosis not present

## 2016-06-25 DIAGNOSIS — I10 Essential (primary) hypertension: Secondary | ICD-10-CM | POA: Diagnosis not present

## 2016-06-25 DIAGNOSIS — I495 Sick sinus syndrome: Secondary | ICD-10-CM | POA: Diagnosis not present

## 2016-06-25 DIAGNOSIS — Z48815 Encounter for surgical aftercare following surgery on the digestive system: Secondary | ICD-10-CM | POA: Diagnosis not present

## 2016-06-26 ENCOUNTER — Ambulatory Visit: Payer: Medicare Other | Admitting: Neurology

## 2016-06-26 ENCOUNTER — Telehealth: Payer: Self-pay | Admitting: *Deleted

## 2016-06-26 DIAGNOSIS — I495 Sick sinus syndrome: Secondary | ICD-10-CM | POA: Diagnosis not present

## 2016-06-26 DIAGNOSIS — E1140 Type 2 diabetes mellitus with diabetic neuropathy, unspecified: Secondary | ICD-10-CM | POA: Diagnosis not present

## 2016-06-26 DIAGNOSIS — I10 Essential (primary) hypertension: Secondary | ICD-10-CM | POA: Diagnosis not present

## 2016-06-26 DIAGNOSIS — Z48815 Encounter for surgical aftercare following surgery on the digestive system: Secondary | ICD-10-CM | POA: Diagnosis not present

## 2016-06-26 DIAGNOSIS — M160 Bilateral primary osteoarthritis of hip: Secondary | ICD-10-CM | POA: Diagnosis not present

## 2016-06-26 DIAGNOSIS — I480 Paroxysmal atrial fibrillation: Secondary | ICD-10-CM | POA: Diagnosis not present

## 2016-06-26 NOTE — Telephone Encounter (Signed)
I agree with these instructions. We will see him tomorrow.

## 2016-06-26 NOTE — Telephone Encounter (Signed)
Home health nurse called stating patient was showing signs of being "orthostatic." I asked nurse to describe symptoms and she stated he has been complaining of being dizzy.Patient negative for chest pain, SHOB, and verbalized being otherwise asymptomatic outside of intermittent dizziness.  Vital signs obtained by home health nurse:  HR: 78 - 82 (Pacemaker)  Lying: BP not obtained Sitting: 142/70 Standing: 100/50   Fluid intake: Glucerna x1 and minimal intake this morning  *Advised home health nurse to push fluids. Patient needs to move slowly changing positions and to ambulate in an environment that patient is able to lean on the wall or have a device to assist if becomes symptomatic. Also told patient to call office back for appointment and/or go to ED if symptoms do not resolve or new symptoms present.   Patient has appointment with Dr. Sarajane Jews tomorrow. Reminded home health nurse and patient to ensure they make their appointment tomorrow.   Will route call to [facility] and Dr. Sarajane Jews for further intervention as appropriate and approval of patient instructions

## 2016-06-27 ENCOUNTER — Encounter: Payer: Self-pay | Admitting: Family Medicine

## 2016-06-27 ENCOUNTER — Ambulatory Visit (INDEPENDENT_AMBULATORY_CARE_PROVIDER_SITE_OTHER): Payer: Medicare Other | Admitting: Family Medicine

## 2016-06-27 VITALS — BP 135/68 | HR 71 | Temp 97.7°F | Ht 70.0 in | Wt 151.0 lb

## 2016-06-27 DIAGNOSIS — G959 Disease of spinal cord, unspecified: Secondary | ICD-10-CM

## 2016-06-27 DIAGNOSIS — E538 Deficiency of other specified B group vitamins: Secondary | ICD-10-CM

## 2016-06-27 DIAGNOSIS — K831 Obstruction of bile duct: Secondary | ICD-10-CM

## 2016-06-27 DIAGNOSIS — R269 Unspecified abnormalities of gait and mobility: Secondary | ICD-10-CM

## 2016-06-27 DIAGNOSIS — G629 Polyneuropathy, unspecified: Secondary | ICD-10-CM

## 2016-06-27 DIAGNOSIS — E118 Type 2 diabetes mellitus with unspecified complications: Secondary | ICD-10-CM

## 2016-06-27 DIAGNOSIS — I495 Sick sinus syndrome: Secondary | ICD-10-CM

## 2016-06-27 DIAGNOSIS — I480 Paroxysmal atrial fibrillation: Secondary | ICD-10-CM

## 2016-06-27 DIAGNOSIS — R17 Unspecified jaundice: Secondary | ICD-10-CM

## 2016-06-27 MED ORDER — RIVAROXABAN 20 MG PO TABS: 20.0000 mg | ORAL_TABLET | Freq: Every day | ORAL | 2 refills | Status: DC

## 2016-06-27 MED ORDER — FLECAINIDE ACETATE 50 MG PO TABS: 50.0000 mg | ORAL_TABLET | Freq: Two times a day (BID) | ORAL | 2 refills | Status: DC

## 2016-06-27 MED ORDER — GLIPIZIDE 10 MG PO TABS: 10.0000 mg | ORAL_TABLET | Freq: Two times a day (BID) | ORAL | 3 refills | Status: DC

## 2016-06-27 MED ORDER — METOPROLOL TARTRATE 25 MG PO TABS: 12.5000 mg | ORAL_TABLET | Freq: Two times a day (BID) | ORAL | 2 refills | Status: DC

## 2016-06-27 MED ORDER — CYANOCOBALAMIN 1000 MCG/ML IJ SOLN
1000.0000 ug | Freq: Once | INTRAMUSCULAR | Status: AC
Start: 2016-06-27 — End: 2016-06-27
  Administered 2016-06-27: 1000 ug via INTRAMUSCULAR

## 2016-06-27 NOTE — Progress Notes (Signed)
   Subjective:    Patient ID: Andrew Norton, male    DOB: 12/23/1926, 82 y.o.   MRN: [id number]  HPI Here with his wife to follow up a hospital stay from 05-17-16 to 05-30-16, and then a rehab stay at [facility] from 05-30-16 to 06-19-16. He presented with nausea, abdominal pain, and jaundice and he was found to have a CBD stone. He underwent ERCP with removal of the stone and a sphincterotomy per Dr. Oretha Caprice. He had prompt relief from the abdominal symptoms but then he developed a tachy/brady syndrome in the heart which caused significant bradycardia. He ended up having a permanent pacemaker placed by Dr. Allegra Lai, and this has been successful. He has had no chest pain or SOB, but he has been very weak and deconditioned. Is appetite is poor but he drinks 2 bottles of Glucerna daily. He is urinating and stooling normally. His BP has been stable in the 130s over 80s. His am fasting blood glucoses have been in the 90 to 110 range. He is working with [place] and they are trying to get some home PT set up.    Review of Systems  Constitutional: Positive for fatigue. Negative for fever.  Respiratory: Negative.   Cardiovascular: Negative.   Gastrointestinal: Negative.   Neurological: Positive for weakness. Negative for dizziness, tremors, seizures, syncope, facial asymmetry, speech difficulty, light-headedness, numbness and headaches.       Objective:   Physical Exam  Constitutional:  Frail, thin, alert. Walks with a walker.   Neck: No thyromegaly present.  Cardiovascular: Normal rate, regular rhythm, normal heart sounds and intact distal pulses.  Exam reveals no gallop.   No murmur heard. Pulmonary/Chest: Effort normal and breath sounds normal. No respiratory distress. He has no wheezes. He has no rales.  Abdominal: Soft. Bowel sounds are normal. He exhibits no distension and no mass. There is no tenderness. There is no rebound and no guarding.  Lymphadenopathy:    He has no  cervical adenopathy.          Assessment & Plan:  He is doing as well as can be expected after removal of a CBD stone with jaundice. He has advanced his diet to regular, although his appetite is poor. He will be starting PT soon to build up his overall strength. His atrial fibrillation, tachy/brady syndrome and HTN are all stable. He remains on Xarelto and aspirin to prevent DVTs and PEs. His diabetes is well controlled. He will follow up with [place] in month and he will see Cardiology on 08-31-16.  Laurey Morale, MD

## 2016-06-27 NOTE — Progress Notes (Signed)
Pre visit review using our clinic review tool, if applicable. No additional management support is needed unless otherwise documented below in the visit note. 

## 2016-06-28 DIAGNOSIS — E1140 Type 2 diabetes mellitus with diabetic neuropathy, unspecified: Secondary | ICD-10-CM | POA: Diagnosis not present

## 2016-06-28 DIAGNOSIS — Z48815 Encounter for surgical aftercare following surgery on the digestive system: Secondary | ICD-10-CM | POA: Diagnosis not present

## 2016-06-28 DIAGNOSIS — I10 Essential (primary) hypertension: Secondary | ICD-10-CM | POA: Diagnosis not present

## 2016-06-28 DIAGNOSIS — M160 Bilateral primary osteoarthritis of hip: Secondary | ICD-10-CM | POA: Diagnosis not present

## 2016-06-28 DIAGNOSIS — I480 Paroxysmal atrial fibrillation: Secondary | ICD-10-CM | POA: Diagnosis not present

## 2016-06-28 DIAGNOSIS — I495 Sick sinus syndrome: Secondary | ICD-10-CM | POA: Diagnosis not present

## 2016-06-29 DIAGNOSIS — Z48815 Encounter for surgical aftercare following surgery on the digestive system: Secondary | ICD-10-CM | POA: Diagnosis not present

## 2016-06-29 DIAGNOSIS — I495 Sick sinus syndrome: Secondary | ICD-10-CM | POA: Diagnosis not present

## 2016-06-29 DIAGNOSIS — M160 Bilateral primary osteoarthritis of hip: Secondary | ICD-10-CM | POA: Diagnosis not present

## 2016-06-29 DIAGNOSIS — E1140 Type 2 diabetes mellitus with diabetic neuropathy, unspecified: Secondary | ICD-10-CM | POA: Diagnosis not present

## 2016-06-29 DIAGNOSIS — I10 Essential (primary) hypertension: Secondary | ICD-10-CM | POA: Diagnosis not present

## 2016-06-29 DIAGNOSIS — I480 Paroxysmal atrial fibrillation: Secondary | ICD-10-CM | POA: Diagnosis not present

## 2016-07-02 DIAGNOSIS — Z48815 Encounter for surgical aftercare following surgery on the digestive system: Secondary | ICD-10-CM | POA: Diagnosis not present

## 2016-07-02 DIAGNOSIS — M160 Bilateral primary osteoarthritis of hip: Secondary | ICD-10-CM | POA: Diagnosis not present

## 2016-07-02 DIAGNOSIS — L602 Onychogryphosis: Secondary | ICD-10-CM | POA: Diagnosis not present

## 2016-07-02 DIAGNOSIS — I495 Sick sinus syndrome: Secondary | ICD-10-CM | POA: Diagnosis not present

## 2016-07-02 DIAGNOSIS — I10 Essential (primary) hypertension: Secondary | ICD-10-CM | POA: Diagnosis not present

## 2016-07-02 DIAGNOSIS — I480 Paroxysmal atrial fibrillation: Secondary | ICD-10-CM | POA: Diagnosis not present

## 2016-07-02 DIAGNOSIS — E1140 Type 2 diabetes mellitus with diabetic neuropathy, unspecified: Secondary | ICD-10-CM | POA: Diagnosis not present

## 2016-07-02 DIAGNOSIS — E1151 Type 2 diabetes mellitus with diabetic peripheral angiopathy without gangrene: Secondary | ICD-10-CM | POA: Diagnosis not present

## 2016-07-03 ENCOUNTER — Telehealth: Payer: Self-pay | Admitting: Family Medicine

## 2016-07-03 DIAGNOSIS — Z48815 Encounter for surgical aftercare following surgery on the digestive system: Secondary | ICD-10-CM | POA: Diagnosis not present

## 2016-07-03 DIAGNOSIS — L570 Actinic keratosis: Secondary | ICD-10-CM | POA: Diagnosis not present

## 2016-07-03 DIAGNOSIS — Z08 Encounter for follow-up examination after completed treatment for malignant neoplasm: Secondary | ICD-10-CM | POA: Diagnosis not present

## 2016-07-03 DIAGNOSIS — Z1283 Encounter for screening for malignant neoplasm of skin: Secondary | ICD-10-CM | POA: Diagnosis not present

## 2016-07-03 DIAGNOSIS — X32XXXD Exposure to sunlight, subsequent encounter: Secondary | ICD-10-CM | POA: Diagnosis not present

## 2016-07-03 DIAGNOSIS — M160 Bilateral primary osteoarthritis of hip: Secondary | ICD-10-CM | POA: Diagnosis not present

## 2016-07-03 DIAGNOSIS — Z85820 Personal history of malignant melanoma of skin: Secondary | ICD-10-CM | POA: Diagnosis not present

## 2016-07-03 DIAGNOSIS — E1140 Type 2 diabetes mellitus with diabetic neuropathy, unspecified: Secondary | ICD-10-CM | POA: Diagnosis not present

## 2016-07-03 DIAGNOSIS — I480 Paroxysmal atrial fibrillation: Secondary | ICD-10-CM | POA: Diagnosis not present

## 2016-07-03 DIAGNOSIS — I495 Sick sinus syndrome: Secondary | ICD-10-CM | POA: Diagnosis not present

## 2016-07-03 DIAGNOSIS — C4441 Basal cell carcinoma of skin of scalp and neck: Secondary | ICD-10-CM | POA: Diagnosis not present

## 2016-07-03 DIAGNOSIS — I10 Essential (primary) hypertension: Secondary | ICD-10-CM | POA: Diagnosis not present

## 2016-07-03 DIAGNOSIS — D045 Carcinoma in situ of skin of trunk: Secondary | ICD-10-CM | POA: Diagnosis not present

## 2016-07-03 NOTE — Telephone Encounter (Signed)
[name] with [facility] states pt's blood sugar readings, which have been fasting( first thing in the am)   have been in the low 60's last 4 days: 61, 62,68, 69   Pt is on 2 DM meds, and has not been eating a snack at bedtime. Jenny Reichmann has discussed w/ pt about having a snack at bedtime.

## 2016-07-03 NOTE — Telephone Encounter (Signed)
Spoke to [place] from [facility] and informed her of new directions.  I also spoke to the pt and informed him to eat a bedtime snack.  Reduce glipizide to 5 mg bid.  Pt agrees.  Updated in [place].  Jenny Reichmann stated that she will also call the pt to make sure he understands directions.

## 2016-07-03 NOTE — Telephone Encounter (Signed)
I agree with him eating a bedtime snack. Also teel him to reduce the Glipizide to 5 mg (or 1/2 tab) bid.

## 2016-07-04 DIAGNOSIS — I480 Paroxysmal atrial fibrillation: Secondary | ICD-10-CM | POA: Diagnosis not present

## 2016-07-04 DIAGNOSIS — M160 Bilateral primary osteoarthritis of hip: Secondary | ICD-10-CM | POA: Diagnosis not present

## 2016-07-04 DIAGNOSIS — I10 Essential (primary) hypertension: Secondary | ICD-10-CM | POA: Diagnosis not present

## 2016-07-04 DIAGNOSIS — E1140 Type 2 diabetes mellitus with diabetic neuropathy, unspecified: Secondary | ICD-10-CM | POA: Diagnosis not present

## 2016-07-04 DIAGNOSIS — I495 Sick sinus syndrome: Secondary | ICD-10-CM | POA: Diagnosis not present

## 2016-07-04 DIAGNOSIS — Z48815 Encounter for surgical aftercare following surgery on the digestive system: Secondary | ICD-10-CM | POA: Diagnosis not present

## 2016-07-05 DIAGNOSIS — I10 Essential (primary) hypertension: Secondary | ICD-10-CM | POA: Diagnosis not present

## 2016-07-05 DIAGNOSIS — I495 Sick sinus syndrome: Secondary | ICD-10-CM | POA: Diagnosis not present

## 2016-07-05 DIAGNOSIS — E1140 Type 2 diabetes mellitus with diabetic neuropathy, unspecified: Secondary | ICD-10-CM | POA: Diagnosis not present

## 2016-07-05 DIAGNOSIS — I480 Paroxysmal atrial fibrillation: Secondary | ICD-10-CM | POA: Diagnosis not present

## 2016-07-05 DIAGNOSIS — M160 Bilateral primary osteoarthritis of hip: Secondary | ICD-10-CM | POA: Diagnosis not present

## 2016-07-05 DIAGNOSIS — Z48815 Encounter for surgical aftercare following surgery on the digestive system: Secondary | ICD-10-CM | POA: Diagnosis not present

## 2016-07-06 DIAGNOSIS — E1140 Type 2 diabetes mellitus with diabetic neuropathy, unspecified: Secondary | ICD-10-CM | POA: Diagnosis not present

## 2016-07-06 DIAGNOSIS — I495 Sick sinus syndrome: Secondary | ICD-10-CM | POA: Diagnosis not present

## 2016-07-06 DIAGNOSIS — H3562 Retinal hemorrhage, left eye: Secondary | ICD-10-CM | POA: Diagnosis not present

## 2016-07-06 DIAGNOSIS — Z48815 Encounter for surgical aftercare following surgery on the digestive system: Secondary | ICD-10-CM | POA: Diagnosis not present

## 2016-07-06 DIAGNOSIS — I10 Essential (primary) hypertension: Secondary | ICD-10-CM | POA: Diagnosis not present

## 2016-07-06 DIAGNOSIS — I480 Paroxysmal atrial fibrillation: Secondary | ICD-10-CM | POA: Diagnosis not present

## 2016-07-06 DIAGNOSIS — H43811 Vitreous degeneration, right eye: Secondary | ICD-10-CM | POA: Diagnosis not present

## 2016-07-06 DIAGNOSIS — M160 Bilateral primary osteoarthritis of hip: Secondary | ICD-10-CM | POA: Diagnosis not present

## 2016-07-06 DIAGNOSIS — H34832 Tributary (branch) retinal vein occlusion, left eye, with macular edema: Secondary | ICD-10-CM | POA: Diagnosis not present

## 2016-07-06 DIAGNOSIS — H353132 Nonexudative age-related macular degeneration, bilateral, intermediate dry stage: Secondary | ICD-10-CM | POA: Diagnosis not present

## 2016-07-06 LAB — HM DIABETES EYE EXAM

## 2016-07-11 DIAGNOSIS — I10 Essential (primary) hypertension: Secondary | ICD-10-CM | POA: Diagnosis not present

## 2016-07-11 DIAGNOSIS — I480 Paroxysmal atrial fibrillation: Secondary | ICD-10-CM | POA: Diagnosis not present

## 2016-07-11 DIAGNOSIS — I495 Sick sinus syndrome: Secondary | ICD-10-CM | POA: Diagnosis not present

## 2016-07-11 DIAGNOSIS — Z48815 Encounter for surgical aftercare following surgery on the digestive system: Secondary | ICD-10-CM | POA: Diagnosis not present

## 2016-07-11 DIAGNOSIS — E1140 Type 2 diabetes mellitus with diabetic neuropathy, unspecified: Secondary | ICD-10-CM | POA: Diagnosis not present

## 2016-07-11 DIAGNOSIS — M160 Bilateral primary osteoarthritis of hip: Secondary | ICD-10-CM | POA: Diagnosis not present

## 2016-07-12 DIAGNOSIS — I495 Sick sinus syndrome: Secondary | ICD-10-CM | POA: Diagnosis not present

## 2016-07-12 DIAGNOSIS — M160 Bilateral primary osteoarthritis of hip: Secondary | ICD-10-CM | POA: Diagnosis not present

## 2016-07-12 DIAGNOSIS — I480 Paroxysmal atrial fibrillation: Secondary | ICD-10-CM | POA: Diagnosis not present

## 2016-07-12 DIAGNOSIS — E1140 Type 2 diabetes mellitus with diabetic neuropathy, unspecified: Secondary | ICD-10-CM | POA: Diagnosis not present

## 2016-07-12 DIAGNOSIS — I10 Essential (primary) hypertension: Secondary | ICD-10-CM | POA: Diagnosis not present

## 2016-07-12 DIAGNOSIS — Z48815 Encounter for surgical aftercare following surgery on the digestive system: Secondary | ICD-10-CM | POA: Diagnosis not present

## 2016-07-13 DIAGNOSIS — I495 Sick sinus syndrome: Secondary | ICD-10-CM | POA: Diagnosis not present

## 2016-07-13 DIAGNOSIS — I10 Essential (primary) hypertension: Secondary | ICD-10-CM | POA: Diagnosis not present

## 2016-07-13 DIAGNOSIS — E1140 Type 2 diabetes mellitus with diabetic neuropathy, unspecified: Secondary | ICD-10-CM | POA: Diagnosis not present

## 2016-07-13 DIAGNOSIS — I480 Paroxysmal atrial fibrillation: Secondary | ICD-10-CM | POA: Diagnosis not present

## 2016-07-13 DIAGNOSIS — M160 Bilateral primary osteoarthritis of hip: Secondary | ICD-10-CM | POA: Diagnosis not present

## 2016-07-13 DIAGNOSIS — Z48815 Encounter for surgical aftercare following surgery on the digestive system: Secondary | ICD-10-CM | POA: Diagnosis not present

## 2016-07-17 DIAGNOSIS — I495 Sick sinus syndrome: Secondary | ICD-10-CM | POA: Diagnosis not present

## 2016-07-17 DIAGNOSIS — I480 Paroxysmal atrial fibrillation: Secondary | ICD-10-CM | POA: Diagnosis not present

## 2016-07-17 DIAGNOSIS — Z48815 Encounter for surgical aftercare following surgery on the digestive system: Secondary | ICD-10-CM | POA: Diagnosis not present

## 2016-07-17 DIAGNOSIS — M160 Bilateral primary osteoarthritis of hip: Secondary | ICD-10-CM | POA: Diagnosis not present

## 2016-07-17 DIAGNOSIS — I10 Essential (primary) hypertension: Secondary | ICD-10-CM | POA: Diagnosis not present

## 2016-07-17 DIAGNOSIS — E1140 Type 2 diabetes mellitus with diabetic neuropathy, unspecified: Secondary | ICD-10-CM | POA: Diagnosis not present

## 2016-07-18 ENCOUNTER — Other Ambulatory Visit: Payer: Self-pay | Admitting: Family Medicine

## 2016-07-19 DIAGNOSIS — E1140 Type 2 diabetes mellitus with diabetic neuropathy, unspecified: Secondary | ICD-10-CM | POA: Diagnosis not present

## 2016-07-19 DIAGNOSIS — Z48815 Encounter for surgical aftercare following surgery on the digestive system: Secondary | ICD-10-CM | POA: Diagnosis not present

## 2016-07-19 DIAGNOSIS — I480 Paroxysmal atrial fibrillation: Secondary | ICD-10-CM | POA: Diagnosis not present

## 2016-07-19 DIAGNOSIS — I495 Sick sinus syndrome: Secondary | ICD-10-CM | POA: Diagnosis not present

## 2016-07-19 DIAGNOSIS — I10 Essential (primary) hypertension: Secondary | ICD-10-CM | POA: Diagnosis not present

## 2016-07-19 DIAGNOSIS — M160 Bilateral primary osteoarthritis of hip: Secondary | ICD-10-CM | POA: Diagnosis not present

## 2016-07-20 ENCOUNTER — Telehealth: Payer: Self-pay | Admitting: Family Medicine

## 2016-07-20 DIAGNOSIS — M160 Bilateral primary osteoarthritis of hip: Secondary | ICD-10-CM | POA: Diagnosis not present

## 2016-07-20 DIAGNOSIS — I480 Paroxysmal atrial fibrillation: Secondary | ICD-10-CM | POA: Diagnosis not present

## 2016-07-20 DIAGNOSIS — I495 Sick sinus syndrome: Secondary | ICD-10-CM | POA: Diagnosis not present

## 2016-07-20 DIAGNOSIS — I10 Essential (primary) hypertension: Secondary | ICD-10-CM | POA: Diagnosis not present

## 2016-07-20 DIAGNOSIS — Z48815 Encounter for surgical aftercare following surgery on the digestive system: Secondary | ICD-10-CM | POA: Diagnosis not present

## 2016-07-20 DIAGNOSIS — E1140 Type 2 diabetes mellitus with diabetic neuropathy, unspecified: Secondary | ICD-10-CM | POA: Diagnosis not present

## 2016-07-20 NOTE — Telephone Encounter (Signed)
Noted. I will order outpatient PT through Epic

## 2016-07-20 NOTE — Addendum Note (Signed)
Addended by: Alysia Penna A on: 07/20/2016 05:08 PM   Modules accepted: Orders

## 2016-07-20 NOTE — Telephone Encounter (Signed)
° ° ° °  Merry Proud from [place] call to say pt completed his home therapy. Pt would liker to go to out patient therapy and he prefer to go to [facility] out patient therapy [facility] location   [phone]

## 2016-07-22 ENCOUNTER — Other Ambulatory Visit: Payer: Self-pay | Admitting: Family Medicine

## 2016-07-23 NOTE — Telephone Encounter (Signed)
I left a voice message for Merry Proud with below information.

## 2016-07-25 ENCOUNTER — Encounter: Payer: Self-pay | Admitting: Physical Therapy

## 2016-07-25 ENCOUNTER — Ambulatory Visit: Payer: Medicare Other | Attending: Family Medicine | Admitting: Physical Therapy

## 2016-07-25 DIAGNOSIS — R2689 Other abnormalities of gait and mobility: Secondary | ICD-10-CM | POA: Insufficient documentation

## 2016-07-25 DIAGNOSIS — R2681 Unsteadiness on feet: Secondary | ICD-10-CM | POA: Diagnosis not present

## 2016-07-25 DIAGNOSIS — M6281 Muscle weakness (generalized): Secondary | ICD-10-CM | POA: Diagnosis not present

## 2016-07-25 DIAGNOSIS — R293 Abnormal posture: Secondary | ICD-10-CM | POA: Diagnosis not present

## 2016-07-25 NOTE — Therapy (Signed)
[place], [place], [address] Phone: [phone]   Fax:  [phone]  Physical Therapy Evaluation  Patient Details  Name: Andrew Norton MRN: [id number] Date of Birth: 17-Sep-1926 Referring Provider: Laurey Morale MD  Encounter Date: 07/25/2016      PT End of Session - 07/25/16 1756    Visit Number 1   Number of Visits 18   Date for PT Re-Evaluation 09/19/16   Authorization Type Medicare: Kx mod by 15th visit, Progress note by 10th visit   PT Start Time 1547   PT Stop Time 1635   PT Time Calculation (min) 48 min   Activity Tolerance Patient tolerated treatment well;Patient limited by fatigue   Behavior During Therapy [facility] for tasks assessed/performed      Past Medical History:  Diagnosis Date  . Anemia   . Arthritis    hips  . Atrial fibrillation ([place])   . Carpal tunnel syndrome    left leg sciatica  . Diabetes mellitus   . Hearing loss    left ear greater loss  . History of shingles    past hx. left hand  . Hyperlipidemia   . Hypertension    EKG, chest x ray 11/15/10 EPIC,  clearance Dr Sharlene Motts on chart/ pt states on meds to "prevent hypertension from diabetes"  . Paget's disease    scrotum  . Paroxysmal atrial fibrillation ([place]) 05/18/2016  . Positive PPD, treated     Past Surgical History:  Procedure Laterality Date  . CERVICAL FUSION     dr Vertell Limber 2009- retained hardware  . colonoscopy  2-08   per Dr. Oletta Lamas, normal   . EP IMPLANTABLE DEVICE N/A 05/28/2016   Procedure: Pacemaker Implant;  Surgeon: Will Meredith Leeds, MD;  Location: [facility];  Service: Cardiovascular;  Laterality: N/A;  . ERCP N/A 05/22/2016   Procedure: ENDOSCOPIC RETROGRADE CHOLANGIOPANCREATOGRAPHY (ERCP);  Surgeon: Milus Banister, MD;  Location: [facility];  Service: Endoscopy;  Laterality: N/A;  . EYE SURGERY     bilateral cataract extraction with IOL  . HIP FRACTURE SURGERY  11-24-10   left hip per Dr,  Andrew Norton  . LUMBAR LAMINECTOMY/DECOMPRESSION MICRODISCECTOMY  11/07/2011   Procedure: LUMBAR LAMINECTOMY/DECOMPRESSION MICRODISCECTOMY;  Surgeon: Johnn Hai, MD;  Location: [facility];  Service: Orthopedics;  Laterality: N/A;  Decompression of the L4 - L5 Central (X-ray)   . partial removal of scrotum    . TONSILLECTOMY    . TOTAL HIP REVISION Left 11/05/2012   Procedure: Conversion of a Bipolar to a Left Total Hip Arthroplasty;  Surgeon: [name], MD;  Location: [facility];  Service: Orthopedics;  Laterality: Left;  Conversion of a Bipolar to a Left Total Hip Arthroplasty    There were no vitals filed for this visit.       Subjective Assessment - 07/25/16 1556    Subjective pt is a 83 y.o M with CC difficulty walking due to feeling uncoordinated.  Have been the hospital January 29th 2017 for another complaint and ended up getting a pacemaker. He and been working with inpatient rehabilitation. pt reports no pain today, and denies N/T.  currently the rollator for ambulation.   Pertinent History hx of Pacemaker   Limitations Standing;Walking;Other (comment)  walking   How long can you sit comfortably? unlimited   How long can you stand comfortably? with Rollator 5 min   How long can you walk comfortably? with rollator 10 min  Diagnostic tests no   Patient Stated Goals to be able to run, using least restrictive device,   Currently in Pain? No/denies   Pain Score 0-No pain   Pain Location Hip   Pain Orientation Right;Left   Pain Descriptors / Indicators --  weakness   Pain Type Other (Comment)  chronic weakness and instability   Pain Onset More than a month ago   Aggravating Factors  prolonged walking/ standing    Pain Relieving Factors sitting            [facility] PT Assessment - 07/25/16 1604      Assessment   Medical Diagnosis Cervical myleopahty, abnormality of gait   Referring Provider Laurey Morale MD   Onset Date/Surgical Date --  August 14, 2015   Hand  Dominance Right   Next MD Visit make on prn   Prior Therapy yes  inpatient     Precautions   Precaution Comments no heavy lifting,   having difficulty remember     Restrictions   Weight Bearing Restrictions No     Balance Screen   Has the patient fallen in the past 6 months No   Has the patient had a decrease in activity level because of a fear of falling?  No   Is the patient reluctant to leave their home because of a fear of falling?  No     Home Environment   Living Environment Private residence   Living Arrangements Spouse/significant other   Available Help at Discharge Available PRN/intermittently   Type of [place] to enter   Entrance Stairs-Number of Steps 3   Entrance Stairs-Rails Can reach both   Home Layout Two level   Alternate Level Stairs-Number of Steps 13   Alternate Level Stairs-Rails Can reach both   [employer] - single point;Shower Tax adviser     Prior Function   Level of Independence Independent;Independent with basic ADLs   Vocation Retired   Leisure hanging out with wife     Posture/Postural Control   Posture/Postural Control Postural limitations   Postural Limitations Rounded Shoulders;Forward head;Increased thoracic kyphosis     ROM / Strength   AROM / PROM / Strength Strength;AROM     AROM   AROM Assessment Site Cervical   Cervical Flexion 30   Cervical Extension 32   Cervical - Right Side Bend 10   Cervical - Left Side Bend 12   Cervical - Right Rotation 40   Cervical - Left Rotation 40     Strength   Strength Assessment Site Hip;Knee   Right/Left Hip Right;Left   Right Hip Flexion 3+/5   Right Hip Extension 3-/5   Right Hip ABduction 3-/5   Right Hip ADduction 4-/5   Left Hip Flexion 3+/5   Left Hip Extension 3-/5   Left Hip ABduction 2+/5   Left Hip ADduction 4-/5   Right/Left Knee Right;Left   Right Knee Flexion 4+/5   Right Knee Extension 4/5   Left Knee Flexion 4/5   Left Knee  Extension 4+/5     Standardized Balance Assessment   Standardized Balance Assessment Berg Balance Test     Berg Balance Test   Sit to Stand Able to stand using hands after several tries   Standing Unsupported Able to stand 2 minutes with supervision   Sitting with Back Unsupported but Feet Supported on Floor or Stool Able to sit safely and securely 2 minutes   Stand  to Sit Uses backs of legs against chair to control descent   Transfers Able to transfer safely, definite need of hands   Standing Unsupported with Eyes Closed Able to stand 10 seconds with supervision   Standing Ubsupported with Feet Together Able to place feet together independently but unable to hold for 30 seconds   From Standing, Reach Forward with Outstretched Arm Can reach forward >12 cm safely (5")   From Standing Position, Pick up Object from Floor Unable to pick up shoe, but reaches 2-5 cm (1-2") from shoe and balances independently   From Standing Position, Turn to Look Behind Over each Shoulder Needs supervision when turning   Turn 360 Degrees Needs close supervision or verbal cueing   Standing Unsupported, Alternately Place Feet on Step/Stool Able to complete >2 steps/needs minimal assist   Standing Unsupported, One Foot in [place] balance while stepping or standing   Standing on One Leg Unable to try or needs assist to prevent fall   Total Score 27                           PT Education - 07/25/16 1756    Education provided Yes   Education Details evaluation findings, POC, goals, HEP with proper form/ rationale   Person(s) Educated Patient   Methods Explanation;Verbal cues;Handout   Comprehension Verbalized understanding;Verbal cues required          PT Short Term Goals - 07/25/16 1805      PT SHORT TERM GOAL #1   Title pt will be </= Min A with inital HEP (08/25/2016)   Time 4   Period Weeks   Status New     PT SHORT TERM GOAL #2   Title He will increase his Berg score to  >/=33 to promote improvement in balance for safety (08/25/2016)   Time 4   Period Weeks   Status New     PT SHORT TERM GOAL #3   Title he will improve bil abductor/ extensore hip strength to >/= 3+/5 to assist with standing and walking activites and promote safety (08/25/2016)   Time 4   Period Weeks   Status New     PT SHORT TERM GOAL #4   Title he will be able to stand for >/= 10 min and walk for >/= 13 minutes using LRAD with </= mod postural sway to promote endurance and maintain safety   Time 4   Period Weeks   Status New           PT Long Term Goals - 07/25/16 1809      PT LONG TERM GOAL #1   Title pt will be </= MIN A with all HEP given as of last visit (09/19/2016)   Time 8   Period Weeks   Status New     PT LONG TERM GOAL #2   Title pt will improve overall hip strength to >/= 4-/5 to assist community ambulation and endurace activities in standing (09/19/2016)   Time 8   Period Weeks   Status New     PT LONG TERM GOAL #3   Title He will improve his Berg Balance score to >/= 43 to demonstrate improvement in balance and safety (09/19/2016)   Time 8   Period Weeks   Status New     PT LONG TERM GOAL #4   Title pt will be able to walk/ stand for >/= 15-20 min with LRAD for  safety and navigate >/= 10 steps with </= MIN A for safety to promote functional endurance and home/ community ambulatoin   Time 8   Period Weeks   Status New               Plan - 07/25/16 1757    Clinical Impression Statement Mr. Pawlus presents to OPPT as a moderate complexity evaluation based on involved PMHX, worsening syptomology of balance/ weakness and evaluation findings regarding dx of myleopathy. reports no pain or discomfort but exhibits significant weakness in bil LE. He scored 27/54 on the Berg indicating high fall risk. He currently uses Rollator for ambulation with an antalgic gait pattern limited stride bil and trendelenberg gait pattern. He would benefit from physical to improve  bil LE strength and balance to maximize his function by addressing the deficits listed below.    Rehab Potential Good   PT Frequency 2x / week   PT Duration 8 weeks   PT Treatment/Interventions ADLs/Self Care Home Management;Cryotherapy;Moist Heat;Gait training;Therapeutic activities;Therapeutic exercise;Taping;Manual techniques;Patient/family education;Stair training;Functional mobility training;Passive range of motion;Neuromuscular re-education;Balance training   PT Next Visit Plan assess/ review HEP, LE strengthening, (hx of pacemaker)   PT Home Exercise Plan clamshells, heel/ toe raise, glute set, seated marching   Consulted and Agree with Plan of Care Patient      Patient will benefit from skilled therapeutic intervention in order to improve the following deficits and impairments:  Abnormal gait, Decreased strength, Improper body mechanics, Postural dysfunction, Decreased activity tolerance, Decreased balance, Decreased endurance, Difficulty walking  Visit Diagnosis: Muscle weakness (generalized) - Plan: PT plan of care cert/re-cert  Unsteadiness on feet - Plan: PT plan of care cert/re-cert  Abnormal posture - Plan: PT plan of care cert/re-cert  Other abnormalities of gait and mobility - Plan: PT plan of care cert/re-cert      G-Codes - [id number]    Functional Assessment Tool Used clinical judement/ BERG   Functional Limitation Mobility: Walking and moving around   Mobility: Walking and Moving Around Current Status [phone]) At least 60 percent but less than 80 percent impaired, limited or restricted   Mobility: Walking and Moving Around Goal Status [phone]) At least 40 percent but less than 60 percent impaired, limited or restricted       Problem List Patient Active Problem List   Diagnosis Date Noted  . Neuropathy ([place]) 05/31/2016  . Gallbladder & bile duct stone with obstruction   . Tachy-brady syndrome ([place]) 05/21/2016  . Sinus pause 05/21/2016  . NSVT (nonsustained  ventricular tachycardia) ([place]) 05/21/2016  . Bile duct obstruction   . Abnormal CT of the abdomen   . Paroxysmal atrial fibrillation ([place]) 05/18/2016  . Protein-calorie malnutrition, severe 05/18/2016  . Jaundice 05/17/2016  . Diabetes mellitus type 2, noninsulin dependent ([place]) 05/17/2016  . LFT elevation 05/17/2016  . Leukocytosis 05/17/2016  . 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 Constipation 07/18/2015  . Aspiration pneumonia ([place]) 04/12/2015  . Sepsis ([place]) 04/12/2015  . Hyperlipidemia 04/12/2015  . Diabetes mellitus type 2 with complications ([place]) 123456  . Lobar pneumonia due to unspecified organism 04/12/2015  . Cervical myelopathy ([place]) 11/10/2013  . Cervical disc disorder with radiculopathy of cervical region 07/01/2013  . Abnormality of gait 07/01/2013  . Unspecified arthropathy, pelvic region and thigh 11/27/2012  . Type II or unspecified type diabetes mellitus with neurological manifestations, not stated as uncontrolled(250.60) 11/27/2012  . Unspecified hereditary and idiopathic peripheral neuropathy 11/27/2012  . Acute posthemorrhagic anemia 11/27/2012  . Pain  with hip hemiarthroplasty ([place]) 11/05/2012  . HYPERKALEMIA 10/26/2009  . Vitamin B12 deficiency 09/27/2009  . CARPAL TUNNEL SYNDROME 09/27/2009  . 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 CHEST PAIN 12/01/2008  . NECK PAIN, CHRONIC 07/01/2008  . METHYLMALONIC ACIDEMIA 02/09/2008  . ANXIETY STATE, UNSPECIFIED 12/29/2007  . LEG CRAMPS, NOCTURNAL 12/29/2007  . SHINGLES 12/15/2007  . HEARING LOSS 08/11/2007  . DYSFUNCTION OF EUSTACHIAN TUBE 08/04/2007  . BACK PAIN, LUMBAR 07/30/2007  . Essential hypertension 07/21/2007  . ACUTE BRONCHITIS 07/21/2007  . HYPERLIPIDEMIA 05/12/2007  . Disorder resulting from impaired renal function 05/12/2007  . ANEMIA-NOS 12/11/2006  . HAY FEVER 12/11/2006  . PAGET'S DISEASE 12/11/2006  . TB SKIN TEST, POSITIVE 12/11/2006   Starr Lake PT, DPT, LAT, ATC   07/25/16  6:16 PM      [facility] [address] [place], [place], [address] Phone: [phone]   Fax:  [phone]  Name: SIMION RIEDL MRN: [id number] Date of Birth: 02-17-1927

## 2016-07-25 NOTE — Therapy (Deleted)
[place], [place], [address] Phone: [phone]   Fax:  [phone]  Patient Details  Name: Andrew Norton MRN: [id number] Date of Birth: 08-16-1926 Referring Provider:  Laurey Morale, MD  Encounter Date: 07/25/2016   [place]arr [place] 07/25/2016, 6:16 PM  [facility] [address] [place], [place], [address] Phone: [phone]   Fax:  [phone]

## 2016-07-26 ENCOUNTER — Ambulatory Visit (INDEPENDENT_AMBULATORY_CARE_PROVIDER_SITE_OTHER): Payer: Medicare Other

## 2016-07-26 DIAGNOSIS — E538 Deficiency of other specified B group vitamins: Secondary | ICD-10-CM | POA: Diagnosis not present

## 2016-07-26 MED ORDER — CYANOCOBALAMIN 1000 MCG/ML IJ SOLN
1000.0000 ug | Freq: Once | INTRAMUSCULAR | Status: AC
  Administered 2016-07-26: 1000 ug via INTRAMUSCULAR

## 2016-07-26 NOTE — Progress Notes (Signed)
Patient had his  B 12 injection on 07/26/2016 at 11.20am. Given by Rosealee Albee CMA

## 2016-07-30 ENCOUNTER — Telehealth: Payer: Self-pay | Admitting: Family Medicine

## 2016-07-30 DIAGNOSIS — M160 Bilateral primary osteoarthritis of hip: Secondary | ICD-10-CM | POA: Diagnosis not present

## 2016-07-30 DIAGNOSIS — E1140 Type 2 diabetes mellitus with diabetic neuropathy, unspecified: Secondary | ICD-10-CM | POA: Diagnosis not present

## 2016-07-30 DIAGNOSIS — I10 Essential (primary) hypertension: Secondary | ICD-10-CM | POA: Diagnosis not present

## 2016-07-30 DIAGNOSIS — I495 Sick sinus syndrome: Secondary | ICD-10-CM | POA: Diagnosis not present

## 2016-07-30 DIAGNOSIS — Z48815 Encounter for surgical aftercare following surgery on the digestive system: Secondary | ICD-10-CM | POA: Diagnosis not present

## 2016-07-30 DIAGNOSIS — I480 Paroxysmal atrial fibrillation: Secondary | ICD-10-CM | POA: Diagnosis not present

## 2016-07-30 NOTE — Telephone Encounter (Signed)
Noted  

## 2016-07-30 NOTE — Telephone Encounter (Signed)
Pt is being discharge from nursing and pt will go to out patient rehab tomorrow

## 2016-07-31 ENCOUNTER — Ambulatory Visit: Payer: Medicare Other | Admitting: Physical Therapy

## 2016-07-31 DIAGNOSIS — R293 Abnormal posture: Secondary | ICD-10-CM

## 2016-07-31 DIAGNOSIS — R2689 Other abnormalities of gait and mobility: Secondary | ICD-10-CM

## 2016-07-31 DIAGNOSIS — R2681 Unsteadiness on feet: Secondary | ICD-10-CM | POA: Diagnosis not present

## 2016-07-31 DIAGNOSIS — M6281 Muscle weakness (generalized): Secondary | ICD-10-CM | POA: Diagnosis not present

## 2016-07-31 NOTE — Therapy (Signed)
[place], [place], [address] Phone: [phone]   Fax:  [phone]  Physical Therapy Treatment  Patient Details  Name: Andrew Norton MRN: [id number] Date of Birth: 10-12-1926 Referring Provider: Laurey Morale MD  Encounter Date: 07/31/2016      PT End of Session - 07/31/16 1303    Visit Number 2   Number of Visits 18   Date for PT Re-Evaluation 09/19/16   PT Start Time 1155   PT Stop Time 1230   PT Time Calculation (min) 35 min      Past Medical History:  Diagnosis Date  . Anemia   . Arthritis    hips  . Atrial fibrillation ([place])   . Carpal tunnel syndrome    left leg sciatica  . Diabetes mellitus   . Hearing loss    left ear greater loss  . History of shingles    past hx. left hand  . Hyperlipidemia   . Hypertension    EKG, chest x ray 11/15/10 EPIC,  clearance Dr Sharlene Motts on chart/ pt states on meds to "prevent hypertension from diabetes"  . Paget's disease    scrotum  . Paroxysmal atrial fibrillation ([place]) 05/18/2016  . Positive PPD, treated     Past Surgical History:  Procedure Laterality Date  . CERVICAL FUSION     dr Vertell Limber 2009- retained hardware  . colonoscopy  2-08   per Dr. Oletta Lamas, normal   . EP IMPLANTABLE DEVICE N/A 05/28/2016   Procedure: Pacemaker Implant;  Surgeon: Will Meredith Leeds, MD;  Location: [facility];  Service: Cardiovascular;  Laterality: N/A;  . ERCP N/A 05/22/2016   Procedure: ENDOSCOPIC RETROGRADE CHOLANGIOPANCREATOGRAPHY (ERCP);  Surgeon: Milus Banister, MD;  Location: [facility];  Service: Endoscopy;  Laterality: N/A;  . EYE SURGERY     bilateral cataract extraction with IOL  . HIP FRACTURE SURGERY  11-24-10   left hip per Dr, [name]  . LUMBAR LAMINECTOMY/DECOMPRESSION MICRODISCECTOMY  11/07/2011   Procedure: LUMBAR LAMINECTOMY/DECOMPRESSION MICRODISCECTOMY;  Surgeon: Johnn Hai, MD;  Location: [facility];  Service: Orthopedics;  Laterality:  N/A;  Decompression of the L4 - L5 Central (X-ray)   . partial removal of scrotum    . TONSILLECTOMY    . TOTAL HIP REVISION Left 11/05/2012   Procedure: Conversion of a Bipolar to a Left Total Hip Arthroplasty;  Surgeon: [name], MD;  Location: [facility];  Service: Orthopedics;  Laterality: Left;  Conversion of a Bipolar to a Left Total Hip Arthroplasty    There were no vitals filed for this visit.      Subjective Assessment - 07/31/16 1155    Subjective pt was 8 minutes late. "Just doing things around the house, no problems right now. Haven't done much other than going to Sunday school. [name] been doing some walking and heel raises."   Currently in Pain? No/denies   Aggravating Factors  prolonged walking/ standing   Pain Relieving Factors sitting                         [facility] Adult PT Treatment/Exercise - 07/31/16 1203      Lumbar Exercises: Stretches   Pelvic Tilt --  1 x 15 with 5 second holds     Lumbar Exercises: Supine   Clam 15 reps  x 2 sets with yellow theraband     Knee/Hip Exercises: Aerobic   Nustep L3 x 5  min     Knee/Hip Exercises: Seated   Long Arc Quad Strengthening;Both;2 sets;10 reps  with 2 pound ankle weights   Marching Strengthening;Both;2 sets;10 reps  with 2 pound ankle weights   Hamstring Curl Strengthening;Both;2 sets;10 reps  with yellow theraband     Knee/Hip Exercises: Supine   Bridges Strengthening;Both;1 set;15 reps                PT Education - 07/31/16 1257    Education provided Yes   Education Details Avoid leaning back during hip flexion and knee exercises. When performing pelvic tilts, needed hand at back for feedback.     Person(s) Educated Patient   Methods Explanation;Verbal cues;Tactile cues   Comprehension Verbalized understanding;Verbal cues required;Tactile cues required          PT Short Term Goals - 07/25/16 1805      PT SHORT TERM GOAL #1   Title pt will be </= Min A with inital HEP  (08/25/2016)   Time 4   Period Weeks   Status New     PT SHORT TERM GOAL #2   Title He will increase his Berg score to >/=33 to promote improvement in balance for safety (08/25/2016)   Time 4   Period Weeks   Status New     PT SHORT TERM GOAL #3   Title he will improve bil abductor/ extensore hip strength to >/= 3+/5 to assist with standing and walking activites and promote safety (08/25/2016)   Time 4   Period Weeks   Status New     PT SHORT TERM GOAL #4   Title he will be able to stand for >/= 10 min and walk for >/= 13 minutes using LRAD with </= mod postural sway to promote endurance and maintain safety   Time 4   Period Weeks   Status New           PT Long Term Goals - 07/25/16 1809      PT LONG TERM GOAL #1   Title pt will be </= MIN A with all HEP given as of last visit (09/19/2016)   Time 8   Period Weeks   Status New     PT LONG TERM GOAL #2   Title pt will improve overall hip strength to >/= 4-/5 to assist community ambulation and endurace activities in standing (09/19/2016)   Time 8   Period Weeks   Status New     PT LONG TERM GOAL #3   Title He will improve his Berg Balance score to >/= 43 to demonstrate improvement in balance and safety (09/19/2016)   Time 8   Period Weeks   Status New     PT LONG TERM GOAL #4   Title pt will be able to walk/ stand for >/= 15-20 min with LRAD for safety and navigate >/= 10 steps with </= MIN A for safety to promote functional endurance and home/ community ambulatoin   Time 8   Period Weeks   Status New               Plan - 07/31/16 1305    Clinical Impression Statement Mr. Sapia was able to complete 5 min on the nustep with mild fatigue. During marching and knee exercises he required cueing to stop leaning back and holding onto the table. After performing bridges at the end of the session he reported some fatigue, but no increase in pain.    PT Next Visit Plan  assess/ review HEP, LE strengthening, balance (hx of  pacemaker)   PT Home Exercise Plan clamshells, heel/ toe raise, glute set, seated marching   Consulted and Agree with Plan of Care Patient      Patient will benefit from skilled therapeutic intervention in order to improve the following deficits and impairments:  Abnormal gait, Decreased strength, Improper body mechanics, Postural dysfunction, Decreased activity tolerance, Decreased balance, Decreased endurance, Difficulty walking  Visit Diagnosis: Muscle weakness (generalized)  Unsteadiness on feet  Abnormal posture  Other abnormalities of gait and mobility     Problem List Patient Active Problem List   Diagnosis Date Noted  . Neuropathy ([place]) 05/31/2016  . Gallbladder & bile duct stone with obstruction   . Tachy-brady syndrome ([place]) 05/21/2016  . Sinus pause 05/21/2016  . NSVT (nonsustained ventricular tachycardia) ([place]) 05/21/2016  . Bile duct obstruction   . Abnormal CT of the abdomen   . Paroxysmal atrial fibrillation ([place]) 05/18/2016  . Protein-calorie malnutrition, severe 05/18/2016  . Jaundice 05/17/2016  . Diabetes mellitus type 2, noninsulin dependent ([place]) 05/17/2016  . LFT elevation 05/17/2016  . Leukocytosis 05/17/2016  . 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 Constipation 07/18/2015  . Aspiration pneumonia ([place]) 04/12/2015  . Sepsis ([place]) 04/12/2015  . Hyperlipidemia 04/12/2015  . Diabetes mellitus type 2 with complications ([place]) 123456  . Lobar pneumonia due to unspecified organism 04/12/2015  . Cervical myelopathy ([place]) 11/10/2013  . Cervical disc disorder with radiculopathy of cervical region 07/01/2013  . Abnormality of gait 07/01/2013  . Unspecified arthropathy, pelvic region and thigh 11/27/2012  . Type II or unspecified type diabetes mellitus with neurological manifestations, not stated as uncontrolled(250.60) 11/27/2012  . Unspecified hereditary and idiopathic peripheral neuropathy 11/27/2012  . Acute posthemorrhagic anemia 11/27/2012  . Pain  with hip hemiarthroplasty ([place]) 11/05/2012  . HYPERKALEMIA 10/26/2009  . Vitamin B12 deficiency 09/27/2009  . CARPAL TUNNEL SYNDROME 09/27/2009  . 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 CHEST PAIN 12/01/2008  . NECK PAIN, CHRONIC 07/01/2008  . METHYLMALONIC ACIDEMIA 02/09/2008  . ANXIETY STATE, UNSPECIFIED 12/29/2007  . LEG CRAMPS, NOCTURNAL 12/29/2007  . SHINGLES 12/15/2007  . HEARING LOSS 08/11/2007  . DYSFUNCTION OF EUSTACHIAN TUBE 08/04/2007  . BACK PAIN, LUMBAR 07/30/2007  . Essential hypertension 07/21/2007  . ACUTE BRONCHITIS 07/21/2007  . HYPERLIPIDEMIA 05/12/2007  . Disorder resulting from impaired renal function 05/12/2007  . ANEMIA-NOS 12/11/2006  . HAY FEVER 12/11/2006  . PAGET'S DISEASE 12/11/2006  . TB SKIN TEST, POSITIVE 12/11/2006    Alda Ponder, SPT 07/31/2016, 1:26 PM  [facility] [address] [place], [place], [address] Phone: [phone]   Fax:  [phone]  Name: KAMARI KOPACK MRN: [id number] Date of Birth: 07-Jul-1927   Starr [place] PT, DPT, LAT, ATC  07/31/16  1:30 PM

## 2016-08-03 ENCOUNTER — Telehealth: Payer: Self-pay | Admitting: Family Medicine

## 2016-08-03 NOTE — Telephone Encounter (Signed)
Pt would like to discuss some of his medication.

## 2016-08-06 ENCOUNTER — Ambulatory Visit: Payer: Medicare Other | Admitting: Physical Therapy

## 2016-08-06 ENCOUNTER — Encounter: Payer: Self-pay | Admitting: Physical Therapy

## 2016-08-06 DIAGNOSIS — M6281 Muscle weakness (generalized): Secondary | ICD-10-CM | POA: Diagnosis not present

## 2016-08-06 DIAGNOSIS — R293 Abnormal posture: Secondary | ICD-10-CM | POA: Diagnosis not present

## 2016-08-06 DIAGNOSIS — R2689 Other abnormalities of gait and mobility: Secondary | ICD-10-CM | POA: Diagnosis not present

## 2016-08-06 DIAGNOSIS — R2681 Unsteadiness on feet: Secondary | ICD-10-CM | POA: Diagnosis not present

## 2016-08-06 NOTE — Therapy (Signed)
[place], [place], [address] Phone: [phone]   Fax:  [phone]  Physical Therapy Treatment  Patient Details  Name: Andrew Norton MRN: [id number] Date of Birth: 1926/10/10 Referring Provider: Laurey Morale MD  Encounter Date: 08/06/2016      PT End of Session - 08/06/16 1140    Visit Number 3   Number of Visits 18   Date for PT Re-Evaluation 09/19/16   PT Start Time 1104   PT Stop Time 1145   PT Time Calculation (min) 41 min   Activity Tolerance Patient tolerated treatment well   Behavior During Therapy [facility] for tasks assessed/performed      Past Medical History:  Diagnosis Date  . Anemia   . Arthritis    hips  . Atrial fibrillation ([place])   . Carpal tunnel syndrome    left leg sciatica  . Diabetes mellitus   . Hearing loss    left ear greater loss  . History of shingles    past hx. left hand  . Hyperlipidemia   . Hypertension    EKG, chest x ray 11/15/10 EPIC,  clearance Dr Andrew Norton on chart/ pt states on meds to "prevent hypertension from diabetes"  . Paget's disease    scrotum  . Paroxysmal atrial fibrillation ([place]) 05/18/2016  . Positive PPD, treated     Past Surgical History:  Procedure Laterality Date  . CERVICAL FUSION     dr Andrew Norton 2009- retained hardware  . colonoscopy  2-08   per Dr. Oletta Norton, normal   . EP IMPLANTABLE DEVICE N/A 05/28/2016   Procedure: Pacemaker Implant;  Surgeon: Andrew Meredith Leeds, MD;  Location: [facility];  Service: Cardiovascular;  Laterality: N/A;  . ERCP N/A 05/22/2016   Procedure: ENDOSCOPIC RETROGRADE CHOLANGIOPANCREATOGRAPHY (ERCP);  Surgeon: Andrew Banister, MD;  Location: [facility];  Service: Endoscopy;  Laterality: N/A;  . EYE SURGERY     bilateral cataract extraction with IOL  . HIP FRACTURE SURGERY  11-24-10   left hip per Dr, Andrew Norton  . LUMBAR LAMINECTOMY/DECOMPRESSION MICRODISCECTOMY  11/07/2011   Procedure: LUMBAR  LAMINECTOMY/DECOMPRESSION MICRODISCECTOMY;  Surgeon: Andrew Hai, MD;  Location: [facility];  Service: Orthopedics;  Laterality: N/A;  Decompression of the L4 - L5 Central (X-ray)   . partial removal of scrotum    . TONSILLECTOMY    . TOTAL HIP REVISION Left 11/05/2012   Procedure: Conversion of a Bipolar to a Left Total Hip Arthroplasty;  Surgeon: Andrew Alf, MD;  Location: [facility];  Service: Orthopedics;  Laterality: Left;  Conversion of a Bipolar to a Left Total Hip Arthroplasty    There were no vitals filed for this visit.      Subjective Assessment - 08/06/16 1109    Subjective Right knee is a little sore.  I have been doing my exercises.   Currently in Pain? Yes   Pain Score --  mild   Pain Location Knee   Pain Orientation Right   Pain Descriptors / Indicators Sore   Pain Type Chronic pain   Pain Frequency Intermittent   Aggravating Factors  weather   Pain Relieving Factors rest   Multiple Pain Sites No                         [facility] Adult PT Treatment/Exercise - 08/06/16 0001      Lumbar Exercises: Stretches   Double Knee to Chest Stretch --  10 x legs rolling on red ball     Lumbar Exercises: Standing   Heel Raises 10 reps   Heel Raises Limitations and toe raise     Lumbar Exercises: Supine   Glut Set 10 reps   Clam 10 reps   Clam Limitations green band   Bridge 10 reps  legs on red ball   Other Supine Lumbar Exercises leg press 10 X 5 seconds,  with 1 pillow  Both     Knee/Hip Exercises: Stretches   Gastroc Stretch 3 reps;20 seconds   Gastroc Stretch Limitations Mis assist for safety incline board     Knee/Hip Exercises: Standing   Other Standing Knee Exercises 5 X 2 marching with CGA     Knee/Hip Exercises: Seated   Ball Squeeze 10   Other Seated Knee/Hip Exercises DF 10 X both, green band   Hamstring Curl 10 reps   Hamstring Limitations green band both   Sit to Sand --  6 X from 6 inch and foam pad on mat,  cga, cues.                   PT Short Term Goals - 08/06/16 1140      PT SHORT TERM GOAL #1   Title pt Andrew be </= Min A with inital HEP (08/25/2016)   Baseline cues   Time 4   Period Weeks   Status On-going     PT SHORT TERM GOAL #2   Period Weeks   Status Unable to assess     PT SHORT TERM GOAL #3   Title he Andrew improve bil abductor/ extensore hip strength to >/= 3+/5 to assist with standing and walking activites and promote safety (08/25/2016)   Time 4   Period Weeks   Status On-going     PT SHORT TERM GOAL #4   Title he Andrew be able to stand for >/= 10 min and walk for >/= 13 minutes using LRAD with </= mod postural sway to promote endurance and maintain safety   Time 4   Period Weeks   Status Unable to assess           PT Long Term Goals - 07/25/16 1809      PT LONG TERM GOAL #1   Title pt Andrew be </= MIN A with all HEP given as of last visit (09/19/2016)   Time 8   Period Weeks   Status New     PT LONG TERM GOAL #2   Title pt Andrew improve overall hip strength to >/= 4-/5 to assist community ambulation and endurace activities in standing (09/19/2016)   Time 8   Period Weeks   Status New     PT LONG TERM GOAL #3   Title He Andrew improve his Berg Balance score to >/= 43 to demonstrate improvement in balance and safety (09/19/2016)   Time 8   Period Weeks   Status New     PT LONG TERM GOAL #4   Title pt Andrew be able to walk/ stand for >/= 15-20 min with LRAD for safety and navigate >/= 10 steps with </= MIN A for safety to promote functional endurance and home/ community ambulatoin   Time 8   Period Weeks   Status New               Plan - 08/06/16 1307    Clinical Impression Statement Andrew Norton was able to sit to stand from elevated table  5 X no hands and CGA for safety.  No new exercises added today.  Patient needs some cues with his HEP.   PT Next Visit Plan assess/ review HEP, LE strengthening, balance (hx of pacemaker)Consider anterior hip and quad  stretches.     PT Home Exercise Plan clamshells, heel/ toe raise, glute set, seated marching   Consulted and Agree with Plan of Care Patient      Patient Andrew benefit from skilled therapeutic intervention in order to improve the following deficits and impairments:  Abnormal gait, Decreased strength, Improper body mechanics, Postural dysfunction, Decreased activity tolerance, Decreased balance, Decreased endurance, Difficulty walking  Visit Diagnosis: Muscle weakness (generalized)  Unsteadiness on feet  Abnormal posture  Other abnormalities of gait and mobility     Problem List Patient Active Problem List   Diagnosis Date Noted  . Neuropathy ([place]) 05/31/2016  . Gallbladder & bile duct stone with obstruction   . Tachy-brady syndrome ([place]) 05/21/2016  . Sinus pause 05/21/2016  . NSVT (nonsustained ventricular tachycardia) ([place]) 05/21/2016  . Bile duct obstruction   . Abnormal CT of the abdomen   . Paroxysmal atrial fibrillation ([place]) 05/18/2016  . Protein-calorie malnutrition, severe 05/18/2016  . Jaundice 05/17/2016  . Diabetes mellitus type 2, noninsulin dependent ([place]) 05/17/2016  . LFT elevation 05/17/2016  . Leukocytosis 05/17/2016  . 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 Constipation 07/18/2015  . Aspiration pneumonia ([place]) 04/12/2015  . Sepsis ([place]) 04/12/2015  . Hyperlipidemia 04/12/2015  . Diabetes mellitus type 2 with complications ([place]) 123456  . Lobar pneumonia due to unspecified organism 04/12/2015  . Cervical myelopathy ([place]) 11/10/2013  . Cervical disc disorder with radiculopathy of cervical region 07/01/2013  . Abnormality of gait 07/01/2013  . Unspecified arthropathy, pelvic region and thigh 11/27/2012  . Type II or unspecified type diabetes mellitus with neurological manifestations, not stated as uncontrolled(250.60) 11/27/2012  . Unspecified hereditary and idiopathic peripheral neuropathy 11/27/2012  . Acute posthemorrhagic anemia 11/27/2012  . Pain  with hip hemiarthroplasty ([place]) 11/05/2012  . HYPERKALEMIA 10/26/2009  . Vitamin B12 deficiency 09/27/2009  . CARPAL TUNNEL SYNDROME 09/27/2009  . 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 CHEST PAIN 12/01/2008  . NECK PAIN, CHRONIC 07/01/2008  . METHYLMALONIC ACIDEMIA 02/09/2008  . ANXIETY STATE, UNSPECIFIED 12/29/2007  . LEG CRAMPS, NOCTURNAL 12/29/2007  . SHINGLES 12/15/2007  . HEARING LOSS 08/11/2007  . DYSFUNCTION OF EUSTACHIAN TUBE 08/04/2007  . BACK PAIN, LUMBAR 07/30/2007  . Essential hypertension 07/21/2007  . ACUTE BRONCHITIS 07/21/2007  . HYPERLIPIDEMIA 05/12/2007  . Disorder resulting from impaired renal function 05/12/2007  . ANEMIA-NOS 12/11/2006  . HAY FEVER 12/11/2006  . PAGET'S DISEASE 12/11/2006  . TB SKIN TEST, POSITIVE 12/11/2006    HARRIS,KAREN PTA 08/06/2016, 1:09 PM  [facility] [address] [place], [place], [address] Phone: [phone]   Fax:  [phone]  Name: Andrew Norton MRN: [id number] Date of Birth: 1926-11-07

## 2016-08-07 NOTE — Telephone Encounter (Signed)
I spoke with pt and he is not sure about Januvia, it was stopped during recent hospital visit and has not resumed taking. Can you clarify? Also needs refill on Flexeril and a 90 day supply to [facility].

## 2016-08-08 ENCOUNTER — Other Ambulatory Visit: Payer: Self-pay | Admitting: Family Medicine

## 2016-08-08 MED ORDER — CYCLOBENZAPRINE HCL 10 MG PO TABS: ORAL_TABLET | ORAL | 3 refills | Status: AC

## 2016-08-08 NOTE — Telephone Encounter (Signed)
I tried to reach pt and no answer, will try again later today. I did fax script for Flexeril to [facility].

## 2016-08-08 NOTE — Telephone Encounter (Signed)
The Januvia should not have been stopped, so he should resume taking it. The Flexeril rx is ready to fax

## 2016-08-09 ENCOUNTER — Ambulatory Visit: Payer: Medicare Other | Admitting: Physical Therapy

## 2016-08-09 ENCOUNTER — Encounter: Payer: Self-pay | Admitting: Physical Therapy

## 2016-08-09 DIAGNOSIS — H353122 Nonexudative age-related macular degeneration, left eye, intermediate dry stage: Secondary | ICD-10-CM | POA: Diagnosis not present

## 2016-08-09 DIAGNOSIS — M6281 Muscle weakness (generalized): Secondary | ICD-10-CM

## 2016-08-09 DIAGNOSIS — R293 Abnormal posture: Secondary | ICD-10-CM

## 2016-08-09 DIAGNOSIS — R2689 Other abnormalities of gait and mobility: Secondary | ICD-10-CM

## 2016-08-09 DIAGNOSIS — H35033 Hypertensive retinopathy, bilateral: Secondary | ICD-10-CM | POA: Diagnosis not present

## 2016-08-09 DIAGNOSIS — H3582 Retinal ischemia: Secondary | ICD-10-CM | POA: Diagnosis not present

## 2016-08-09 DIAGNOSIS — H34832 Tributary (branch) retinal vein occlusion, left eye, with macular edema: Secondary | ICD-10-CM | POA: Diagnosis not present

## 2016-08-09 DIAGNOSIS — R2681 Unsteadiness on feet: Secondary | ICD-10-CM | POA: Diagnosis not present

## 2016-08-09 NOTE — Patient Instructions (Addendum)
Decompression exercises all issued from exercise drawer,  5 x 5 each Decompression 5-15 minutes

## 2016-08-09 NOTE — Therapy (Signed)
[place], [place], [address] Phone: [phone]   Fax:  [phone]  Physical Therapy Treatment  Patient Details  Name: Andrew Norton MRN: [id number] Date of Birth: 08-05-1926 Referring Provider: Laurey Morale MD  Encounter Date: 08/09/2016      PT End of Session - 08/09/16 1249    Visit Number 4   Number of Visits 18   Date for PT Re-Evaluation 09/19/16   PT Start Time 1103   PT Stop Time 1145   PT Time Calculation (min) 42 min   Activity Tolerance Patient tolerated treatment well   Behavior During Therapy [facility] for tasks assessed/performed      Past Medical History:  Diagnosis Date  . Anemia   . Arthritis    hips  . Atrial fibrillation ([place])   . Carpal tunnel syndrome    left leg sciatica  . Diabetes mellitus   . Hearing loss    left ear greater loss  . History of shingles    past hx. left hand  . Hyperlipidemia   . Hypertension    EKG, chest x ray 11/15/10 EPIC,  clearance Dr Sharlene Motts on chart/ pt states on meds to "prevent hypertension from diabetes"  . Paget's disease    scrotum  . Paroxysmal atrial fibrillation ([place]) 05/18/2016  . Positive PPD, treated     Past Surgical History:  Procedure Laterality Date  . CERVICAL FUSION     dr Vertell Limber 2009- retained hardware  . colonoscopy  2-08   per Dr. Oletta Lamas, normal   . EP IMPLANTABLE DEVICE N/A 05/28/2016   Procedure: Pacemaker Implant;  Surgeon: Will Meredith Leeds, MD;  Location: [facility];  Service: Cardiovascular;  Laterality: N/A;  . ERCP N/A 05/22/2016   Procedure: ENDOSCOPIC RETROGRADE CHOLANGIOPANCREATOGRAPHY (ERCP);  Surgeon: Milus Banister, MD;  Location: [facility];  Service: Endoscopy;  Laterality: N/A;  . EYE SURGERY     bilateral cataract extraction with IOL  . HIP FRACTURE SURGERY  11-24-10   left hip per Dr, [name]  . LUMBAR LAMINECTOMY/DECOMPRESSION MICRODISCECTOMY  11/07/2011   Procedure: LUMBAR  LAMINECTOMY/DECOMPRESSION MICRODISCECTOMY;  Surgeon: Johnn Hai, MD;  Location: [facility];  Service: Orthopedics;  Laterality: N/A;  Decompression of the L4 - L5 Central (X-ray)   . partial removal of scrotum    . TONSILLECTOMY    . TOTAL HIP REVISION Left 11/05/2012   Procedure: Conversion of a Bipolar to a Left Total Hip Arthroplasty;  Surgeon: [name], MD;  Location: [facility];  Service: Orthopedics;  Laterality: Left;  Conversion of a Bipolar to a Left Total Hip Arthroplasty    There were no vitals filed for this visit.      Subjective Assessment - 08/09/16 1111    Subjective At this time of day I don't have any back pain.  My knee is a little sore.  i was sore from exercises , but not as sore as I was thinking.  They helped my posturre,   Patient is accompained by: --  wife in lobby, she did not want me to work him very hard due to a proceedure later today(eye injection)   Currently in Pain? Yes   Pain Score 3    Pain Location Knee   Pain Orientation Right                         [facility] Adult PT Treatment/Exercise - 08/09/16 0001  Lumbar Exercises: Stretches   Passive Hamstring Stretch 3 reps;30 seconds   Double Knee to Chest Stretch 5 reps  lrgs on rd ball,  small bridge   Lower Trunk Rotation 5 reps   Lower Trunk Rotation Limitations legs on red ball     Lumbar Exercises: Supine   Glut Set 5 reps;5 seconds   Bridge 10 reps   Other Supine Lumbar Exercises decompression 5 minutes (3.5 pillows), shoulder, head, leg presses 5 x 5 seconds  good morning stretch 10 seconds 5 x     Knee/Hip Exercises: Stretches   Gastroc Stretch 30 seconds;3 reps   Gastroc Stretch Limitations min assist for safety.                PT Education - 08/09/16 1124    Education provided Yes   Education Details HEP   Person(s) Educated Patient;Spouse   Methods Explanation   Comprehension Verbalized understanding;Returned demonstration          PT Short  Term Goals - 08/06/16 1140      PT SHORT TERM GOAL #1   Title pt will be </= Min A with inital HEP (08/25/2016)   Baseline cues   Time 4   Period Weeks   Status On-going     PT SHORT TERM GOAL #2   Period Weeks   Status Unable to assess     PT SHORT TERM GOAL #3   Title he will improve bil abductor/ extensore hip strength to >/= 3+/5 to assist with standing and walking activites and promote safety (08/25/2016)   Time 4   Period Weeks   Status On-going     PT SHORT TERM GOAL #4   Title he will be able to stand for >/= 10 min and walk for >/= 13 minutes using LRAD with </= mod postural sway to promote endurance and maintain safety   Time 4   Period Weeks   Status Unable to assess           PT Long Term Goals - 07/25/16 1809      PT LONG TERM GOAL #1   Title pt will be </= MIN A with all HEP given as of last visit (09/19/2016)   Time 8   Period Weeks   Status New     PT LONG TERM GOAL #2   Title pt will improve overall hip strength to >/= 4-/5 to assist community ambulation and endurace activities in standing (09/19/2016)   Time 8   Period Weeks   Status New     PT LONG TERM GOAL #3   Title He will improve his Berg Balance score to >/= 43 to demonstrate improvement in balance and safety (09/19/2016)   Time 8   Period Weeks   Status New     PT LONG TERM GOAL #4   Title pt will be able to walk/ stand for >/= 15-20 min with LRAD for safety and navigate >/= 10 steps with </= MIN A for safety to promote functional endurance and home/ community ambulatoin   Time 8   Period Weeks   Status New               Plan - 08/09/16 1249    Clinical Impression Statement Progress toward home exercise program goals.  Patient says he has already noticed improvements with his posture.  He requires moderate assist to get off mat.  He needs close supervision with any standing exercises.  No increased pain at  end of session,  I feel stretched.    PT Next Visit Plan review decompression  exercise, ask ablou how his eye injection went. continue stretching, stabilization   PT Home Exercise Plan clamshells, heel/ toe raise, glute set, seated marching, decompression      Patient will benefit from skilled therapeutic intervention in order to improve the following deficits and impairments:  Abnormal gait, Decreased strength, Improper body mechanics, Postural dysfunction, Decreased activity tolerance, Decreased balance, Decreased endurance, Difficulty walking  Visit Diagnosis: Muscle weakness (generalized)  Unsteadiness on feet  Abnormal posture  Other abnormalities of gait and mobility     Problem List Patient Active Problem List   Diagnosis Date Noted  . Neuropathy ([place]) 05/31/2016  . Gallbladder & bile duct stone with obstruction   . Tachy-brady syndrome ([place]) 05/21/2016  . Sinus pause 05/21/2016  . NSVT (nonsustained ventricular tachycardia) ([place]) 05/21/2016  . Bile duct obstruction   . Abnormal CT of the abdomen   . Paroxysmal atrial fibrillation ([place]) 05/18/2016  . Protein-calorie malnutrition, severe 05/18/2016  . Jaundice 05/17/2016  . Diabetes mellitus type 2, noninsulin dependent ([place]) 05/17/2016  . LFT elevation 05/17/2016  . Leukocytosis 05/17/2016  . 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 Constipation 07/18/2015  . Aspiration pneumonia ([place]) 04/12/2015  . Sepsis ([place]) 04/12/2015  . Hyperlipidemia 04/12/2015  . Diabetes mellitus type 2 with complications ([place]) 123456  . Lobar pneumonia due to unspecified organism 04/12/2015  . Cervical myelopathy ([place]) 11/10/2013  . Cervical disc disorder with radiculopathy of cervical region 07/01/2013  . Abnormality of gait 07/01/2013  . Unspecified arthropathy, pelvic region and thigh 11/27/2012  . Type II or unspecified type diabetes mellitus with neurological manifestations, not stated as uncontrolled(250.60) 11/27/2012  . Unspecified hereditary and idiopathic peripheral neuropathy 11/27/2012  . Acute  posthemorrhagic anemia 11/27/2012  . Pain with hip hemiarthroplasty ([place]) 11/05/2012  . HYPERKALEMIA 10/26/2009  . Vitamin B12 deficiency 09/27/2009  . CARPAL TUNNEL SYNDROME 09/27/2009  . 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 CHEST PAIN 12/01/2008  . NECK PAIN, CHRONIC 07/01/2008  . METHYLMALONIC ACIDEMIA 02/09/2008  . ANXIETY STATE, UNSPECIFIED 12/29/2007  . LEG CRAMPS, NOCTURNAL 12/29/2007  . SHINGLES 12/15/2007  . HEARING LOSS 08/11/2007  . DYSFUNCTION OF EUSTACHIAN TUBE 08/04/2007  . BACK PAIN, LUMBAR 07/30/2007  . Essential hypertension 07/21/2007  . ACUTE BRONCHITIS 07/21/2007  . HYPERLIPIDEMIA 05/12/2007  . Disorder resulting from impaired renal function 05/12/2007  . ANEMIA-NOS 12/11/2006  . HAY FEVER 12/11/2006  . PAGET'S DISEASE 12/11/2006  . TB SKIN TEST, POSITIVE 12/11/2006    HARRIS,KAREN PTA 08/09/2016, 12:54 PM  [facility] [address] [place], [place], [address] Phone: [phone]   Fax:  [phone]  Name: VIRGLE SHORTELL MRN: [id number] Date of Birth: Jan 17, 1927

## 2016-08-09 NOTE — Telephone Encounter (Signed)
I spoke with pt and went over below information, he will start back on Januvia and he does not need any refills at this time.

## 2016-08-13 ENCOUNTER — Ambulatory Visit: Payer: Medicare Other | Admitting: Physical Therapy

## 2016-08-13 DIAGNOSIS — R2689 Other abnormalities of gait and mobility: Secondary | ICD-10-CM | POA: Diagnosis not present

## 2016-08-13 DIAGNOSIS — R293 Abnormal posture: Secondary | ICD-10-CM | POA: Diagnosis not present

## 2016-08-13 DIAGNOSIS — R2681 Unsteadiness on feet: Secondary | ICD-10-CM

## 2016-08-13 DIAGNOSIS — M6281 Muscle weakness (generalized): Secondary | ICD-10-CM

## 2016-08-13 NOTE — Therapy (Signed)
Andrew [place], [place], [address] Phone: [phone]   Fax:  [phone]  Physical Therapy Treatment  Patient Details  Name: Andrew Norton MRN: [id number] Date of Birth: Jan 05, 1927 Referring Provider: Laurey Morale MD  Encounter Date: 08/13/2016      PT End of Session - 08/13/16 1320    Visit Number 5   Number of Visits 18   Date for PT Re-Evaluation 09/19/16   PT Start Time 1103   PT Stop Time 1155   PT Time Calculation (min) 52 min   Activity Tolerance Patient tolerated treatment well   Behavior During Therapy [facility] for tasks assessed/performed      Past Medical History:  Diagnosis Date  . Anemia   . Arthritis    hips  . Atrial fibrillation ([place])   . Carpal tunnel syndrome    left leg sciatica  . Diabetes mellitus   . Hearing loss    left ear greater loss  . History of shingles    past hx. left hand  . Hyperlipidemia   . Hypertension    EKG, chest x ray 11/15/10 EPIC,  clearance Dr Sharlene Motts on chart/ pt states on meds to "prevent hypertension from diabetes"  . Paget's disease    scrotum  . Paroxysmal atrial fibrillation ([place]) 05/18/2016  . Positive PPD, treated     Past Surgical History:  Procedure Laterality Date  . CERVICAL FUSION     dr Vertell Limber 2009- retained hardware  . colonoscopy  2-08   per Dr. Oletta Lamas, normal   . EP IMPLANTABLE DEVICE N/A 05/28/2016   Procedure: Pacemaker Implant;  Surgeon: Will Meredith Leeds, MD;  Location: [facility];  Service: Cardiovascular;  Laterality: N/A;  . ERCP N/A 05/22/2016   Procedure: ENDOSCOPIC RETROGRADE CHOLANGIOPANCREATOGRAPHY (ERCP);  Surgeon: Milus Banister, MD;  Location: [facility];  Service: Endoscopy;  Laterality: N/A;  . EYE SURGERY     bilateral cataract extraction with IOL  . HIP FRACTURE SURGERY  11-24-10   left hip per Dr, [name]  . LUMBAR LAMINECTOMY/DECOMPRESSION MICRODISCECTOMY  11/07/2011   Procedure: LUMBAR  LAMINECTOMY/DECOMPRESSION MICRODISCECTOMY;  Surgeon: Johnn Hai, MD;  [facility];  Service: Orthopedics;  Laterality: N/A;  Decompression of the L4 - L5 Central (X-ray)   . partial removal of scrotum    . TONSILLECTOMY    . TOTAL HIP REVISION Left 11/05/2012   Procedure: Conversion of a Bipolar to a Left Total Hip Arthroplasty;  Surgeon: [name], MD;  Location: [facility];  Service: Orthopedics;  Laterality: Left;  Conversion of a Bipolar to a Left Total Hip Arthroplasty    There were no vitals filed for this visit.      Subjective Assessment - 08/13/16 1103    Subjective "I am feeling good, my R knee is a little stiff. Exercises have been going well, I skipped yesterday though. My eye injection went well, I could tell I had something done ut nothing major""   Currently in Pain? Yes   Pain Score 3    Pain Location Knee   Pain Orientation Right   Pain Descriptors / Indicators Sore   Pain Frequency Intermittent   Aggravating Factors  normal pain    Pain Relieving Factors rest                         [facility] Adult PT Treatment/Exercise - 08/13/16 0001  Lumbar Exercises: Stretches   Active Hamstring Stretch --  both sides   Passive Hamstring Stretch 3 reps;30 seconds  both sides   Double Knee to Chest Stretch --  10 reps by 5 s hold on red ball     Lumbar Exercises: Supine   Clam 10 reps  2 sets with green band   Bridge 10 reps;5 seconds  red ball under legs, breaking half way through   Other Supine Lumbar Exercises decompression 5 minutes (3 pillows), shoulder, head, leg presses 5 x 5 seconds     Knee/Hip Exercises: Aerobic   Nustep L3 x 5 min  UE/LE     Knee/Hip Exercises: Seated   Ball Squeeze 15 with 3 second hold   Hamstring Curl 1 set;10 reps;Both  green band     Modalities   Modalities Cryotherapy     Cryotherapy   Number Minutes Cryotherapy 10 Minutes   Cryotherapy Location Knee  R knee   Type of Cryotherapy Ice pack                   PT Short Term Goals - 08/06/16 1140      PT SHORT TERM GOAL #1   Title pt will be </= Min A with inital HEP (08/25/2016)   Baseline cues   Time 4   Period Weeks   Status On-going     PT SHORT TERM GOAL #2   Period Weeks   Status Unable to assess     PT SHORT TERM GOAL #3   Title he will improve bil abductor/ extensore hip strength to >/= 3+/5 to assist with standing and walking activites and promote safety (08/25/2016)   Time 4   Period Weeks   Status On-going     PT SHORT TERM GOAL #4   Title he will be able to stand for >/= 10 min and walk for >/= 13 minutes using LRAD with </= mod postural sway to promote endurance and maintain safety   Time 4   Period Weeks   Status Unable to assess           PT Long Term Goals - 07/25/16 1809      PT LONG TERM GOAL #1   Title pt will be </= MIN A with all HEP given as of last visit (09/19/2016)   Time 8   Period Weeks   Status New     PT LONG TERM GOAL #2   Title pt will improve overall hip strength to >/= 4-/5 to assist community ambulation and endurace activities in standing (09/19/2016)   Time 8   Period Weeks   Status New     PT LONG TERM GOAL #3   Title He will improve his Berg Balance score to >/= 43 to demonstrate improvement in balance and safety (09/19/2016)   Time 8   Period Weeks   Status New     PT LONG TERM GOAL #4   Title pt will be able to walk/ stand for >/= 15-20 min with LRAD for safety and navigate >/= 10 steps with </= MIN A for safety to promote functional endurance and home/ community ambulatoin   Time 8   Period Weeks   Status New               Plan - 08/13/16 1322    Clinical Impression Statement Mr. Marra says he has been doing his HEP with little difficulty for most of his exercises. He has been having  some knee pain but says that is normal. He is able to perform bridges on the red ball but does require cueing to keep core tight and legs in the center. Has been  performing decompression exercise at home. Requested ice pack for R knee post session.    PT Next Visit Plan decompression exercise, continue stretching, stabilization, hip strengthening     PT Home Exercise Plan clamshells, heel/ toe raise, glute set, seated marching, decompression   Consulted and Agree with Plan of Care Patient      Patient will benefit from skilled therapeutic intervention in order to improve the following deficits and impairments:  Abnormal gait, Decreased strength, Improper body mechanics, Postural dysfunction, Decreased activity tolerance, Decreased balance, Decreased endurance, Difficulty walking  Visit Diagnosis: Muscle weakness (generalized)  Unsteadiness on feet  Abnormal posture  Other abnormalities of gait and mobility     Problem List Patient Active Problem List   Diagnosis Date Noted  . Neuropathy ([place]) 05/31/2016  . Gallbladder & bile duct stone with obstruction   . Tachy-brady syndrome ([place]) 05/21/2016  . Sinus pause 05/21/2016  . NSVT (nonsustained ventricular tachycardia) ([place]) 05/21/2016  . Bile duct obstruction   . Abnormal CT of the abdomen   . Paroxysmal atrial fibrillation ([place]) 05/18/2016  . Protein-calorie malnutrition, severe 05/18/2016  . Jaundice 05/17/2016  . Diabetes mellitus type 2, noninsulin dependent ([place]) 05/17/2016  . LFT elevation 05/17/2016  . Leukocytosis 05/17/2016  . 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 Constipation 07/18/2015  . Aspiration pneumonia ([place]) 04/12/2015  . Sepsis ([place]) 04/12/2015  . Hyperlipidemia 04/12/2015  . Diabetes mellitus type 2 with complications ([place]) 123456  . Lobar pneumonia due to unspecified organism 04/12/2015  . Cervical myelopathy ([place]) 11/10/2013  . Cervical disc disorder with radiculopathy of cervical region 07/01/2013  . Abnormality of gait 07/01/2013  . Unspecified arthropathy, pelvic region and thigh 11/27/2012  . Type II or unspecified type diabetes mellitus with  neurological manifestations, not stated as uncontrolled(250.60) 11/27/2012  . Unspecified hereditary and idiopathic peripheral neuropathy 11/27/2012  . Acute posthemorrhagic anemia 11/27/2012  . Pain with hip hemiarthroplasty ([place]) 11/05/2012  . HYPERKALEMIA 10/26/2009  . Vitamin B12 deficiency 09/27/2009  . CARPAL TUNNEL SYNDROME 09/27/2009  . 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 CHEST PAIN 12/01/2008  . NECK PAIN, CHRONIC 07/01/2008  . METHYLMALONIC ACIDEMIA 02/09/2008  . ANXIETY STATE, UNSPECIFIED 12/29/2007  . LEG CRAMPS, NOCTURNAL 12/29/2007  . SHINGLES 12/15/2007  . HEARING LOSS 08/11/2007  . DYSFUNCTION OF EUSTACHIAN TUBE 08/04/2007  . BACK PAIN, LUMBAR 07/30/2007  . Essential hypertension 07/21/2007  . ACUTE BRONCHITIS 07/21/2007  . HYPERLIPIDEMIA 05/12/2007  . Disorder resulting from impaired renal function 05/12/2007  . ANEMIA-NOS 12/11/2006  . HAY FEVER 12/11/2006  . PAGET'S DISEASE 12/11/2006  . TB SKIN TEST, POSITIVE 12/11/2006    Alda Ponder, SPT 08/13/2016, 1:36 PM  [facility] [address] [place], [place], [address] Phone: [phone]   Fax:  [phone]  Name: TOMAZ GWYNN MRN: [id number] Date of Birth: 1926-11-12   Starr [place] PT, DPT, LAT, ATC  08/13/16  1:44 PM

## 2016-08-15 ENCOUNTER — Ambulatory Visit: Payer: Medicare Other | Admitting: Physical Therapy

## 2016-08-15 DIAGNOSIS — R2689 Other abnormalities of gait and mobility: Secondary | ICD-10-CM | POA: Diagnosis not present

## 2016-08-15 DIAGNOSIS — R2681 Unsteadiness on feet: Secondary | ICD-10-CM | POA: Diagnosis not present

## 2016-08-15 DIAGNOSIS — R293 Abnormal posture: Secondary | ICD-10-CM

## 2016-08-15 DIAGNOSIS — M6281 Muscle weakness (generalized): Secondary | ICD-10-CM | POA: Diagnosis not present

## 2016-08-15 NOTE — Therapy (Signed)
[place], [place], [address] Phone: [phone]   Fax:  [phone]  Physical Therapy Treatment  Patient Details  Name: Andrew Norton MRN: [id number] Date of Birth: 11-Feb-1927 Referring Provider: Laurey Morale MD  Encounter Date: 08/15/2016      PT End of Session - 08/15/16 1151    Visit Number 6   Number of Visits 18   Date for PT Re-Evaluation 09/19/16   Authorization Type Medicare: Kx mod by 15th visit, Progress note by 10th visit   PT Start Time 1059   PT Stop Time 1143   PT Time Calculation (min) 44 min   Activity Tolerance Patient tolerated treatment well   Behavior During Therapy [facility] for tasks assessed/performed      Past Medical History:  Diagnosis Date  . Anemia   . Arthritis    hips  . Atrial fibrillation ([place])   . Carpal tunnel syndrome    left leg sciatica  . Diabetes mellitus   . Hearing loss    left ear greater loss  . History of shingles    past hx. left hand  . Hyperlipidemia   . Hypertension    EKG, chest x ray 11/15/10 EPIC,  clearance Dr Sharlene Motts on chart/ pt states on meds to "prevent hypertension from diabetes"  . Paget's disease    scrotum  . Paroxysmal atrial fibrillation ([place]) 05/18/2016  . Positive PPD, treated     Past Surgical History:  Procedure Laterality Date  . CERVICAL FUSION     dr Vertell Limber 2009- retained hardware  . colonoscopy  2-08   per Dr. Oletta Lamas, normal   . EP IMPLANTABLE DEVICE N/A 05/28/2016   Procedure: Pacemaker Implant;  Surgeon: Will Meredith [place], MD;  Location: [place] [facility];  Service: Cardiovascular;  Laterality: N/A;  . ERCP N/A 05/22/2016   Procedure: ENDOSCOPIC RETROGRADE CHOLANGIOPANCREATOGRAPHY (ERCP);  Surgeon: Milus Banister, MD;  Location: [facility];  Service: Endoscopy;  Laterality: N/A;  . EYE SURGERY     bilateral cataract extraction with IOL  . HIP FRACTURE SURGERY  11-24-10   left hip per Dr, [name]  . LUMBAR  LAMINECTOMY/DECOMPRESSION MICRODISCECTOMY  11/07/2011   Procedure: LUMBAR LAMINECTOMY/DECOMPRESSION MICRODISCECTOMY;  Surgeon: Johnn Hai, MD;  Location: [facility];  Service: Orthopedics;  Laterality: N/A;  Decompression of the L4 - L5 Central (X-ray)   . partial removal of scrotum    . TONSILLECTOMY    . TOTAL HIP REVISION Left 11/05/2012   Procedure: Conversion of a Bipolar to a Left Total Hip Arthroplasty;  Surgeon: [name], MD;  Location: [facility];  Service: Orthopedics;  Laterality: Left;  Conversion of a Bipolar to a Left Total Hip Arthroplasty    There were no vitals filed for this visit.      Subjective Assessment - 08/15/16 1058    Subjective "not having much pain, just feeling weak"   Currently in Pain? Yes   Pain Score 3    Pain Location Knee   Pain Orientation Right                         [facility] Adult PT Treatment/Exercise - 08/15/16 1104      Lumbar Exercises: Aerobic   Stationary Bike Nu-Step L4 x 5 min  UE/LE     Lumbar Exercises: Seated   Long Arc Quad on Chair Strengthening;Both;2 sets;10 reps  3#   Hip Flexion on [employer]  20 reps;Both  with 3#  (while seated on table)   Sit to Stand 5 reps  with 2 inch step on the table to increase table height   Sit to Stand Limitations verbal cues on proper mechanics and avoiding "plopping"     Lumbar Exercises: Supine   Clam 10 reps  red theraband x 2 sets   Bent Knee Raise 15 reps  x 2 sets    Bent Knee Raise Limitations verbal cues to keep the core tight   Bridge 10 reps;5 seconds  with physioball under knees to promote ease and control             Balance Exercises - 08/15/16 1148      Balance Exercises: Standing   Standing Eyes Opened Wide (BOA);Narrow base of support (BOS);4 reps;30 secs  starting with wide BOS and gradually narrowing ea. rep           PT Education - 08/15/16 1150    Education provided Yes   Education Details benefits of continues stretching of the  hamstrings and how his wife can assist him with stretching. proper mechanics of getting into and out of bed and sit to stand biomechanics   Person(s) Educated Patient;Spouse   Methods Verbal cues;Explanation;Demonstration   Comprehension Verbalized understanding;Verbal cues required          PT Short Term Goals - 08/06/16 1140      PT SHORT TERM GOAL #1   Title pt will be </= Min A with inital HEP (08/25/2016)   Baseline cues   Time 4   Period Weeks   Status On-going     PT SHORT TERM GOAL #2   Period Weeks   Status Unable to assess     PT SHORT TERM GOAL #3   Title he will improve bil abductor/ extensore hip strength to >/= 3+/5 to assist with standing and walking activites and promote safety (08/25/2016)   Time 4   Period Weeks   Status On-going     PT SHORT TERM GOAL #4   Title he will be able to stand for >/= 10 min and walk for >/= 13 minutes using LRAD with </= mod postural sway to promote endurance and maintain safety   Time 4   Period Weeks   Status Unable to assess           PT Long Term Goals - 07/25/16 1809      PT LONG TERM GOAL #1   Title pt will be </= MIN A with all HEP given as of last visit (09/19/2016)   Time 8   Period Weeks   Status New     PT LONG TERM GOAL #2   Title pt will improve overall hip strength to >/= 4-/5 to assist community ambulation and endurace activities in standing (09/19/2016)   Time 8   Period Weeks   Status New     PT LONG TERM GOAL #3   Title He will improve his Berg Balance score to >/= 43 to demonstrate improvement in balance and safety (09/19/2016)   Time 8   Period Weeks   Status New     PT LONG TERM GOAL #4   Title pt will be able to walk/ stand for >/= 15-20 min with LRAD for safety and navigate >/= 10 steps with </= MIN A for safety to promote functional endurance and home/ community ambulatoin   Time 8   Period Weeks   Status New  Plan - 08/15/16 1152    Clinical Impression Statement Mr.  Helsley continues to report some soreness in the R knee but not bad. focused today mostly on strengthening working form core to bil LE to promote stability. began wide base Rhomberg balance and gradually narrowed stance which he demonstrates minimal postural sway but fatigued quickly.    PT Next Visit Plan decompression exercise, continue stretching, stabilization, hip strengthening     Consulted and Agree with Plan of Care Patient      Patient will benefit from skilled therapeutic intervention in order to improve the following deficits and impairments:  Abnormal gait, Decreased strength, Improper body mechanics, Postural dysfunction, Decreased activity tolerance, Decreased balance, Decreased endurance, Difficulty walking  Visit Diagnosis: Muscle weakness (generalized)  Unsteadiness on feet  Abnormal posture  Other abnormalities of gait and mobility     Problem List Patient Active Problem List   Diagnosis Date Noted  . Neuropathy ([place]) 05/31/2016  . Gallbladder & bile duct stone with obstruction   . Tachy-brady syndrome ([place]) 05/21/2016  . Sinus pause 05/21/2016  . NSVT (nonsustained ventricular tachycardia) ([place]) 05/21/2016  . Bile duct obstruction   . Abnormal CT of the abdomen   . Paroxysmal atrial fibrillation ([place]) 05/18/2016  . Protein-calorie malnutrition, severe 05/18/2016  . Jaundice 05/17/2016  . Diabetes mellitus type 2, noninsulin dependent ([place]) 05/17/2016  . LFT elevation 05/17/2016  . Leukocytosis 05/17/2016  . 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 Constipation 07/18/2015  . Aspiration pneumonia ([place]) 04/12/2015  . Sepsis ([place]) 04/12/2015  . Hyperlipidemia 04/12/2015  . Diabetes mellitus type 2 with complications ([place]) 123456  . Lobar pneumonia due to unspecified organism 04/12/2015  . Cervical myelopathy ([place]) 11/10/2013  . Cervical disc disorder with radiculopathy of cervical region 07/01/2013  . Abnormality of gait 07/01/2013  . Unspecified arthropathy,  pelvic region and thigh 11/27/2012  . Type II or unspecified type diabetes mellitus with neurological manifestations, not stated as uncontrolled(250.60) 11/27/2012  . Unspecified hereditary and idiopathic peripheral neuropathy 11/27/2012  . Acute posthemorrhagic anemia 11/27/2012  . Pain with hip hemiarthroplasty ([place]) 11/05/2012  . HYPERKALEMIA 10/26/2009  . Vitamin B12 deficiency 09/27/2009  . CARPAL TUNNEL SYNDROME 09/27/2009  . 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 CHEST PAIN 12/01/2008  . NECK PAIN, CHRONIC 07/01/2008  . METHYLMALONIC ACIDEMIA 02/09/2008  . ANXIETY STATE, UNSPECIFIED 12/29/2007  . LEG CRAMPS, NOCTURNAL 12/29/2007  . SHINGLES 12/15/2007  . HEARING LOSS 08/11/2007  . DYSFUNCTION OF EUSTACHIAN TUBE 08/04/2007  . BACK PAIN, LUMBAR 07/30/2007  . Essential hypertension 07/21/2007  . ACUTE BRONCHITIS 07/21/2007  . HYPERLIPIDEMIA 05/12/2007  . Disorder resulting from impaired renal function 05/12/2007  . ANEMIA-NOS 12/11/2006  . HAY FEVER 12/11/2006  . PAGET'S DISEASE 12/11/2006  . TB SKIN TEST, POSITIVE 12/11/2006   Starr Lake PT, DPT, LAT, ATC  08/15/16  11:57 AM      [facility] [address] [place], [place], [address] Phone: [phone]   Fax:  [phone]  Name: KOVE LANIUS MRN: [id number] Date of Birth: [birth year]

## 2016-08-20 ENCOUNTER — Ambulatory Visit: Payer: Medicare Other | Attending: Family Medicine | Admitting: Physical Therapy

## 2016-08-20 DIAGNOSIS — R2689 Other abnormalities of gait and mobility: Secondary | ICD-10-CM | POA: Diagnosis not present

## 2016-08-20 DIAGNOSIS — R293 Abnormal posture: Secondary | ICD-10-CM | POA: Diagnosis not present

## 2016-08-20 DIAGNOSIS — M6281 Muscle weakness (generalized): Secondary | ICD-10-CM

## 2016-08-20 DIAGNOSIS — R2681 Unsteadiness on feet: Secondary | ICD-10-CM | POA: Diagnosis not present

## 2016-08-20 NOTE — Patient Instructions (Signed)
Gastroc Stretch    Stand with right foot back, leg straight, forward leg bent. Keeping heel on floor, turned slightly out, lean into wall until stretch is felt in calf. Hold __10-30__ seconds. Repeat __3__ times per set. Do __1__ sets per session. Do _1___ sessions per day.  http://orth.exer.us/26   Copyright  VHI. All rights reserved.

## 2016-08-20 NOTE — Therapy (Signed)
[place], [place], [address] Phone: [phone]   Fax:  [phone]  Physical Therapy Treatment  Patient Details  Name: Andrew Norton MRN: [id number] Date of Birth: 03-27-1927 Referring Provider: Laurey Morale MD  Encounter Date: 08/20/2016      PT End of Session - 08/20/16 1158    Visit Number 7   Number of Visits 18   Date for PT Re-Evaluation 09/19/16   PT Start Time 1105   PT Stop Time 1145   PT Time Calculation (min) 40 min   Activity Tolerance Patient tolerated treatment well   Behavior During Therapy [facility] for tasks assessed/performed      Past Medical History:  Diagnosis Date  . Anemia   . Arthritis    hips  . Atrial fibrillation ([place])   . Carpal tunnel syndrome    left leg sciatica  . Diabetes mellitus   . Hearing loss    left ear greater loss  . History of shingles    past hx. left hand  . Hyperlipidemia   . Hypertension    EKG, chest x ray 11/15/10 EPIC,  clearance Dr Sharlene Motts on chart/ pt states on meds to "prevent hypertension from diabetes"  . Paget's disease    scrotum  . Paroxysmal atrial fibrillation ([place]) 05/18/2016  . Positive PPD, treated     Past Surgical History:  Procedure Laterality Date  . CERVICAL FUSION     dr Vertell Limber 2009- retained hardware  . colonoscopy  2-08   per Dr. Oletta Lamas, normal   . EP IMPLANTABLE DEVICE N/A 05/28/2016   Procedure: Pacemaker Implant;  Surgeon: Will Meredith [place], MD;  Location: [place] [facility];  Service: Cardiovascular;  Laterality: N/A;  . ERCP N/A 05/22/2016   Procedure: ENDOSCOPIC RETROGRADE CHOLANGIOPANCREATOGRAPHY (ERCP);  Surgeon: Milus Banister, MD;  Location: [facility];  Service: Endoscopy;  Laterality: N/A;  . EYE SURGERY     bilateral cataract extraction with IOL  . HIP FRACTURE SURGERY  11-24-10   left hip per Dr, [name]  . LUMBAR LAMINECTOMY/DECOMPRESSION MICRODISCECTOMY  11/07/2011   Procedure: LUMBAR  LAMINECTOMY/DECOMPRESSION MICRODISCECTOMY;  Surgeon: Johnn Hai, MD;  Location: [facility];  Service: Orthopedics;  Laterality: N/A;  Decompression of the L4 - L5 Central (X-ray)   . partial removal of scrotum    . TONSILLECTOMY    . TOTAL HIP REVISION Left 11/05/2012   Procedure: Conversion of a Bipolar to a Left Total Hip Arthroplasty;  Surgeon: [name], MD;  Location: [facility];  Service: Orthopedics;  Laterality: Left;  Conversion of a Bipolar to a Left Total Hip Arthroplasty    There were no vitals filed for this visit.      Subjective Assessment - 08/20/16 1131    Subjective knee pain 3/10.  [place] has been helping with hamstring stretches.  PT is helpin a lot .     Currently in Pain? Yes   Pain Score 3    Pain Location Knee   Pain Orientation Right   Pain Descriptors / Indicators Sore   Pain Type Chronic pain   Pain Onset More than a month ago   Pain Frequency Intermittent   Aggravating Factors  not sure   Pain Relieving Factors rest                         [facility] Adult PT Treatment/Exercise - 08/20/16 0001      Lumbar  Exercises: Stretches   Passive Hamstring Stretch 3 reps;30 seconds     Lumbar Exercises: Standing   Other Standing Lumbar Exercises face wall,  arm slides with weight shift and glut squeeze 5x each,  heavy cues CGi     Lumbar Exercises: Supine   Bridge 10 reps   Other Supine Lumbar Exercises decompression 5 minutes (3 pillows), shoulder, head, leg presses 5 x 5 seconds  also good morning stretch with gluteal squeeze 5 X 5 seconds                PT Education - 08/20/16 1158    Education provided Yes   Education Details HEP   Person(s) Educated Patient   Methods Explanation;Demonstration;Verbal cues;Handout   Comprehension Verbalized understanding;Returned demonstration          PT Short Term Goals - 08/20/16 1206      PT SHORT TERM GOAL #1   Title pt will be </= Min A with inital HEP (08/25/2016)   Baseline Min  assist from his wife   Time 4   Period Weeks   Status Achieved     PT SHORT TERM GOAL #2   Title He will increase his Berg score to >/=33 to promote improvement in balance for safety (08/25/2016)   Time 4   Period Weeks   Status Unable to assess     PT SHORT TERM GOAL #3   Title he will improve bil abductor/ extensore hip strength to >/= 3+/5 to assist with standing and walking activites and promote safety (08/25/2016)   Time 4   Period Weeks   Status Unable to assess     PT SHORT TERM GOAL #4   Title he will be able to stand for >/= 10 min and walk for >/= 13 minutes using LRAD with </= mod postural sway to promote endurance and maintain safety   Time 4   Period Weeks   Status Unable to assess           PT Long Term Goals - 07/25/16 1809      PT LONG TERM GOAL #1   Title pt will be </= MIN A with all HEP given as of last visit (09/19/2016)   Time 8   Period Weeks   Status New     PT LONG TERM GOAL #2   Title pt will improve overall hip strength to >/= 4-/5 to assist community ambulation and endurace activities in standing (09/19/2016)   Time 8   Period Weeks   Status New     PT LONG TERM GOAL #3   Title He will improve his Berg Balance score to >/= 43 to demonstrate improvement in balance and safety (09/19/2016)   Time 8   Period Weeks   Status New     PT LONG TERM GOAL #4   Title pt will be able to walk/ stand for >/= 15-20 min with LRAD for safety and navigate >/= 10 steps with </= MIN A for safety to promote functional endurance and home/ community ambulatoin   Time 8   Period Weeks   Status New               Plan - 08/20/16 1200    Clinical Impression Statement Focus today on stretches,  stabilization and gait.  Stretches at wall, pregait weight shifting,  were limited by arm ROM.  Patient demonstrated increased weight shifting at end of session,  pain is not increased.   PT Next Visit  Plan decompression exercise, continue stretching, stabilization, hip  strengthening  , review calf stretch.  Balance exercises.   PT Home Exercise Plan clamshells, heel/ toe raise, glute set, seated marching, decompression, calf stretch.   Consulted and Agree with Plan of Care Patient      Patient will benefit from skilled therapeutic intervention in order to improve the following deficits and impairments:  Abnormal gait, Decreased strength, Improper body mechanics, Postural dysfunction, Decreased activity tolerance, Decreased balance, Decreased endurance, Difficulty walking  Visit Diagnosis: Muscle weakness (generalized)  Unsteadiness on feet  Abnormal posture  Other abnormalities of gait and mobility     Problem List Patient Active Problem List   Diagnosis Date Noted  . Neuropathy ([place]) 05/31/2016  . Gallbladder & bile duct stone with obstruction   . Tachy-brady syndrome ([place]) 05/21/2016  . Sinus pause 05/21/2016  . NSVT (nonsustained ventricular tachycardia) ([place]) 05/21/2016  . Bile duct obstruction   . Abnormal CT of the abdomen   . Paroxysmal atrial fibrillation ([place]) 05/18/2016  . Protein-calorie malnutrition, severe 05/18/2016  . Jaundice 05/17/2016  . Diabetes mellitus type 2, noninsulin dependent ([place]) 05/17/2016  . LFT elevation 05/17/2016  . Leukocytosis 05/17/2016  . 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 Constipation 07/18/2015  . Aspiration pneumonia ([place]) 04/12/2015  . Sepsis ([place]) 04/12/2015  . Hyperlipidemia 04/12/2015  . Diabetes mellitus type 2 with complications ([place]) 123456  . Lobar pneumonia due to unspecified organism 04/12/2015  . Cervical myelopathy ([place]) 11/10/2013  . Cervical disc disorder with radiculopathy of cervical region 07/01/2013  . Abnormality of gait 07/01/2013  . Unspecified arthropathy, pelvic region and thigh 11/27/2012  . Type II or unspecified type diabetes mellitus with neurological manifestations, not stated as uncontrolled(250.60) 11/27/2012  . Unspecified hereditary and idiopathic peripheral  neuropathy 11/27/2012  . Acute posthemorrhagic anemia 11/27/2012  . Pain with hip hemiarthroplasty ([place]) 11/05/2012  . HYPERKALEMIA 10/26/2009  . Vitamin B12 deficiency 09/27/2009  . CARPAL TUNNEL SYNDROME 09/27/2009  . 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 CHEST PAIN 12/01/2008  . NECK PAIN, CHRONIC 07/01/2008  . METHYLMALONIC ACIDEMIA 02/09/2008  . ANXIETY STATE, UNSPECIFIED 12/29/2007  . LEG CRAMPS, NOCTURNAL 12/29/2007  . SHINGLES 12/15/2007  . HEARING LOSS 08/11/2007  . DYSFUNCTION OF EUSTACHIAN TUBE 08/04/2007  . BACK PAIN, LUMBAR 07/30/2007  . Essential hypertension 07/21/2007  . ACUTE BRONCHITIS 07/21/2007  . HYPERLIPIDEMIA 05/12/2007  . Disorder resulting from impaired renal function 05/12/2007  . ANEMIA-NOS 12/11/2006  . HAY FEVER 12/11/2006  . PAGET'S DISEASE 12/11/2006  . TB SKIN TEST, POSITIVE 12/11/2006    HARRIS,KAREN PTA 08/20/2016, 12:08 PM  [facility] [address] [place], [place], [address] Phone: [phone]   Fax:  [phone]  Name: Andrew Norton MRN: [id number] Date of Birth: 1926-08-10

## 2016-08-22 ENCOUNTER — Ambulatory Visit (INDEPENDENT_AMBULATORY_CARE_PROVIDER_SITE_OTHER): Payer: Medicare Other | Admitting: Neurology

## 2016-08-22 ENCOUNTER — Encounter: Payer: Self-pay | Admitting: Neurology

## 2016-08-22 VITALS — BP 125/57 | HR 61 | Ht 70.0 in | Wt 158.0 lb

## 2016-08-22 DIAGNOSIS — G8929 Other chronic pain: Secondary | ICD-10-CM | POA: Diagnosis not present

## 2016-08-22 DIAGNOSIS — G959 Disease of spinal cord, unspecified: Secondary | ICD-10-CM

## 2016-08-22 DIAGNOSIS — R269 Unspecified abnormalities of gait and mobility: Secondary | ICD-10-CM

## 2016-08-22 DIAGNOSIS — G629 Polyneuropathy, unspecified: Secondary | ICD-10-CM | POA: Diagnosis not present

## 2016-08-22 DIAGNOSIS — R252 Cramp and spasm: Secondary | ICD-10-CM

## 2016-08-22 DIAGNOSIS — M545 Low back pain: Secondary | ICD-10-CM | POA: Diagnosis not present

## 2016-08-22 NOTE — Progress Notes (Signed)
PATIENT: Andrew Norton DOB: June 02, 1927   HISTORY OF PRESENT ILLNESS:  HISTORY (Dr. Krista Blue): Grizzard is 83 years old right-handed Caucasian male, accompanied by his wife, primary care physician is Dr. Alysia Penna, he was a previous patient of Dr. Erling Cruz. He had a past medical history of diabetes, shingles involving right C7 dermatome, mild memory loss, essential tremor  He had a history of acute on chronic cervical cord compression at decompression surgery October 2009, by neurosurgeon Dr. Vertell Limber, anterior cervical decompression, fusion at C3-4, C4-5, C5-6, with allograft, and anterior cervical plate, post surgically, he developed cardiac arrhythmia, myelopathy, with profound left arm, leg weakness, had slow recovery, Since the surgery, he continues to notice burning and numbness in both hands, left greater than right, mild gait difficulty,  He was treated with gabapentin 300 mg for his neuropathic pain involving left arm, and leg  He also suffered bladder urgency, occasionally bladder spasm, bowel incontinence, Post surgical MRI cervical in January 2011 showed ACDF at C3, 4, C4-5, C5-6, interbody anterior plate and screw, hypodensity in the cord at C5-6, that is cystic, compatible with chronic myelomalacia, at C6 and 7, there is progression of central disc protrusion, disc osteophyte complex on the left, moderate large extruded disc fragment at T1-2, extending cranially. He had a repeat cervical MRI September 2011, which showed similar findings, but also questionable cord edema vs. myelomalacia at C4-5, He also complained of severe right lower extremity shooting pain, fell, and broken his left hip, had open reduction internal fixation 2011 by Dr.Allusio, He had worsening low back pain, CT myelogram December 2012 showed mild multifactorial spinal stenosis at L1-2, bilateral foraminal stenosis, mild multifactorial spinal stenosis at L3, moderate disc bulging at L4-5, with bilateral subarticular recess,  and foraminal stenosis, right greater than left, compression deformity of L1 and 2. He eventually underwent lumbar decompression in April 2013 by Dr. Maxie Better, He complains of almost constant and mild to moderate left hand and left leg neuropathic pain, despite Neurontin treatment, mild worsening gait difficulty, and he is taking hydrocodone 5/325 mg twice a day, receiving epidural injection by Dr. Nelva Bush.   MRI cervical in 2015  together: bone fusion and metal hardware from C3-C6 levels. Spinal cord atrophy and gliosis at C6 level.  C6-7: disc bulging, uncovertebral joint hypertrophy, and facet hypertrophy with moderate spinal stenosis and severe biforaminal foraminal stenosis. C3-4, C4-5 and C5-6 there is severe biforaminal foraminal stenosis. T1-2: disc protrusion with superior migration of disc material with slight deformation of spinal cord. Compared to MRI on 10/26/10, there T1-2 disc protrusion has slightly increased. Otherwise no change.  UPDATE Feb 7th 2018: He was found to have nonsustained ventricular tachycardia, had pacemaker placement in November 2017, and then had ERCP for biliary duct stone removal, was treated with Cipro and Flagyl,  He now goes to rehab twice a week for gait training, he is now using walker, he still complains of pain in his fingers, arms and legs, feet, low back, he is using lidoderm patches, but would not take away the pain. He is still taking gabapentin 400 mg 4 times a day, use Flexeril every night for bilateral lower extremity muscle cramping, occasionally urinary urgency incontinence, mild chronic constipations    REVIEW OF SYSTEMS: Out of a complete 14 system review of symptoms, the patient complains only of the following symptoms, and all other reviewed systems are negative. Fatigue,  ALLERGIES: PCN  HOME MEDICATIONS: Outpatient Medications Prior to Visit  Medication Sig Dispense Refill  .  aspirin 81 MG chewable tablet Chew 1 tablet (81 mg total) by  mouth daily. 1 tablet 0  . cyanocobalamin (,VITAMIN B-12,) 1000 MCG/ML injection Inject 1,000 mcg into the muscle every 30 (thirty) days.    . cyclobenzaprine (FLEXERIL) 10 MG tablet TAKE 1 TABLET 3 TIMES DAILYAS NEEDED FOR MUSCLE SPASMS 270 tablet 3  . Ferrous Sulfate 220 (44 FE) MG/5ML LIQD TAKE 7 MLS BY MOUTH DAILY 473 mL 3  . finasteride (PROSCAR) 5 MG tablet TAKE 1 TABLET DAILY 90 tablet 3  . flecainide (TAMBOCOR) 50 MG tablet Take 1 tablet (50 mg total) by mouth every 12 (twelve) hours. 60 tablet 2  . gabapentin (NEURONTIN) 400 MG capsule TAKE 1 CAPSULE 4 TIMES     DAILY 360 capsule 3  . glipiZIDE (GLUCOTROL) 10 MG tablet TAKE 1 TABLET TWICE A DAY  BEFORE MEALS 180 tablet 1  . HYDROcodone-acetaminophen (NORCO/VICODIN) 5-325 MG tablet Take 1 tablet by mouth every 6 (six) hours as needed for moderate pain. 120 tablet 0  . lidocaine (LIDODERM) 5 % Place 1 patch onto the skin daily. Remove & Discard patch within 12 hours or as directed by MD 90 patch 3  . losartan (COZAAR) 50 MG tablet TAKE 1 TABLET EVERY MORNING 90 tablet 1  . metFORMIN (GLUCOPHAGE) 1000 MG tablet TAKE 1 TABLET TWICE A DAY  WITH MEALS 180 tablet 3  . metoprolol tartrate (LOPRESSOR) 25 MG tablet Take 0.5 tablets (12.5 mg total) by mouth 2 (two) times daily. 30 tablet 2  . NUTRITIONAL SUPPLEMENT LIQD Take 120 mLs by mouth 2 (two) times daily. SF Med[employer] for nutritional support    . polyethylene glycol (MIRALAX / GLYCOLAX) packet Take 17 g by mouth daily as needed for mild constipation.    . rivaroxaban (XARELTO) 20 MG TABS tablet Take 1 tablet (20 mg total) by mouth daily with supper. 30 tablet 2  . FEROSUL 220 (44 Fe) MG/5ML solution TAKE 7 MLS BY MOUTH DAILY 473 mL 0  . glipiZIDE (GLUCOTROL) 10 MG tablet Take 1 tablet (10 mg total) by mouth 2 (two) times daily before a meal. (Patient taking differently: Take 5 mg by mouth 2 (two) times daily before a meal. ) 60 tablet 3  . losartan (COZAAR) 25 MG tablet Take 1 tablet (25 mg  total) by mouth daily. 30 tablet 0   No facility-administered medications prior to visit.     PAST MEDICAL HISTORY: Past Medical History:  Diagnosis Date  . Anemia   . Arthritis    hips  . Atrial fibrillation ([place])   . Carpal tunnel syndrome    left leg sciatica  . Diabetes mellitus   . Hearing loss    left ear greater loss  . History of shingles    past hx. left hand  . Hyperlipidemia   . Hypertension    EKG, chest x ray 11/15/10 EPIC,  clearance Dr Sharlene Motts on chart/ pt states on meds to "prevent hypertension from diabetes"  . Paget's disease    scrotum  . Paroxysmal atrial fibrillation ([place]) 05/18/2016  . Positive PPD, treated     PAST SURGICAL HISTORY: Past Surgical History:  Procedure Laterality Date  . CERVICAL FUSION     dr Vertell Limber 2009- retained hardware  . colonoscopy  2-08   per Dr. Oletta Lamas, normal   . EP IMPLANTABLE DEVICE N/A 05/28/2016   Procedure: Pacemaker Implant;  Surgeon: Will Meredith Leeds, MD;  Location: [facility];  Service: Cardiovascular;  Laterality: N/A;  .  ERCP N/A 05/22/2016   Procedure: ENDOSCOPIC RETROGRADE CHOLANGIOPANCREATOGRAPHY (ERCP);  Surgeon: Milus Banister, MD;  Location: [facility];  Service: Endoscopy;  Laterality: N/A;  . EYE SURGERY     bilateral cataract extraction with IOL  . HIP FRACTURE SURGERY  11-24-10   left hip per Dr, [name]  . LUMBAR LAMINECTOMY/DECOMPRESSION MICRODISCECTOMY  11/07/2011   Procedure: LUMBAR LAMINECTOMY/DECOMPRESSION MICRODISCECTOMY;  Surgeon: Johnn Hai, MD;  Location: [facility];  Service: Orthopedics;  Laterality: N/A;  Decompression of the L4 - L5 Central (X-ray)   . partial removal of scrotum    . TONSILLECTOMY    . TOTAL HIP REVISION Left 11/05/2012   Procedure: Conversion of a Bipolar to a Left Total Hip Arthroplasty;  Surgeon: [name], MD;  Location: [facility];  Service: Orthopedics;  Laterality: Left;  Conversion of a Bipolar to a Left Total Hip Arthroplasty    FAMILY  HISTORY: Family History  Problem Relation Age of Onset  . Tuberculosis      SOCIAL HISTORY: Social History   Social History  . Marital status: Married    Spouse name: [name]  . Number of children: 3  . Years of education: College-BS   Occupational History  . retired Retired   Social History Main Topics  . Smoking status: Never Smoker  . Smokeless tobacco: Never Used  . Alcohol use No  . Drug use: No  . Sexual activity: No   Other Topics Concern  . Not on file   Social History Narrative   Pt lives at home with wife [name])   Pt is right handed   Education college-BS   Pt states that he drinks 1 cup of coffee daily, and occ may have a cup of tea or soda      PHYSICAL EXAM  Vitals:   08/22/16 1158  BP: (!) 125/57  Pulse: 61  Weight: 158 lb (71.7 kg)  Height: 5\' 10"  (1.778 m)   Body mass index is 22.67 kg/m.  PHYSICAL EXAMNIATION:  Gen: NAD, conversant, well nourised, obese, well groomed                     Cardiovascular: Regular rate rhythm, no peripheral edema, warm, nontender. Eyes: Conjunctivae clear without exudates or hemorrhage Neck: Supple, no carotid bruits. Pulmonary: Clear to auscultation bilaterally   NEUROLOGICAL EXAM:  MENTAL STATUS: Speech:    Speech is normal; fluent and spontaneous with normal comprehension.  Cognition:     Orientation to time, place and person     Normal recent and remote memory     Normal Attention span and concentration     Normal Language, naming, repeating,spontaneous speech     Fund of knowledge   CRANIAL NERVES: CN II: Visual fields are full to confrontation. Fundoscopic exam is normal with sharp discs and no vascular changes. Pupils are round equal and briskly reactive to light. CN III, IV, VI: extraocular movement are normal. No ptosis. CN V: Facial sensation is intact to pinprick in all 3 divisions bilaterally. Corneal responses are intact.  CN VII: Face is symmetric with normal eye closure and smile. CN  VIII: Hearing is normal to rubbing fingers CN IX, X: Palate elevates symmetrically. Phonation is normal. CN XI: Head turning and shoulder shrug are intact CN XII: Tongue is midline with normal movements and no atrophy.  MOTOR: There is no pronator drift of out-stretched arms. Muscle bulk and tone are normal. Muscle strength is normal.  REFLEXES: Reflexes are  2+ and symmetric at the biceps, triceps, knees, and ankles. Plantar responses are flexor.  SENSORY: Mildly length dependent decreased to light touch  COORDINATION: Rapid alternating movements and fine finger movements are intact. There is no dysmetria on finger-to-nose and heel-knee-shin.    GAIT/STANCE:  Needs assistance to get up from seated position, spastic, wide-based, cautious   DIAGNOSTIC DATA (LABS, IMAGING, TESTING) - I reviewed patient records, labs, notes, testing and imaging myself where available.  Lab Results  Component Value Date   WBC 8.1 06/05/2016   HGB 8.4 (A) 06/05/2016   HCT 26 (A) 06/05/2016   MCV 90.9 05/30/2016   PLT 219 06/05/2016      Component Value Date/Time   NA 139 06/05/2016   K 4.9 06/05/2016   CL 99 (L) 05/30/2016 0254   CO2 24 05/30/2016 0254   GLUCOSE 65 05/30/2016 0254   BUN 23 (A) 06/05/2016   CREATININE 0.8 06/05/2016   CREATININE 0.93 05/30/2016 0254   CALCIUM 8.4 (L) 05/30/2016 0254   PROT 5.7 (L) 05/30/2016 0254   ALBUMIN 2.5 (L) 05/30/2016 0254   AST 119 (H) 05/30/2016 0254   ALT 88 (H) 05/30/2016 0254   ALKPHOS 708 (H) 05/30/2016 0254   BILITOT 3.0 (H) 05/30/2016 0254   GFRNONAA >60 05/30/2016 0254   GFRAA >60 05/30/2016 0254   Lab Results  Component Value Date   CHOL 152 10/05/2015   HDL 43.00 10/05/2015   LDLCALC 72 10/05/2015   TRIG 184.0 (H) 10/05/2015   CHOLHDL 4 10/05/2015   Lab Results  Component Value Date   HGBA1C 7.6 (H) 03/07/2016    Lab Results  Component Value Date   TSH 2.356 05/18/2016      ASSESSMENT AND PLAN 83 y.o. year old Gait  abnormality  Multifactorial, history of cervical spondylitic myelopathy, status post decompression, continued evidence of cervical cystic myelomalacia, also had a history of lumbar stenosis, status post decompression,   Continue ambulate with walker, outpatient physical therapy  Chronic neuropathic pain, low back pain  Continue gabapentin 400 mg 4 times a day, Lidoderm patch  Marcial Pacas, M.D. Ph.D.  [facility] [place], [place] [address] Phone: [phone] Fax:      [phone]

## 2016-08-23 ENCOUNTER — Ambulatory Visit: Payer: Medicare Other | Admitting: Physical Therapy

## 2016-08-23 DIAGNOSIS — R2689 Other abnormalities of gait and mobility: Secondary | ICD-10-CM | POA: Diagnosis not present

## 2016-08-23 DIAGNOSIS — R293 Abnormal posture: Secondary | ICD-10-CM

## 2016-08-23 DIAGNOSIS — M6281 Muscle weakness (generalized): Secondary | ICD-10-CM

## 2016-08-23 DIAGNOSIS — R2681 Unsteadiness on feet: Secondary | ICD-10-CM

## 2016-08-23 NOTE — Therapy (Signed)
[place], [place], [address] Phone: [phone]   Fax:  [phone]  Physical Therapy Treatment  Patient Details  Name: Andrew Norton MRN: [id number] Date of Birth: 11-23-1926 Referring Provider: Laurey Morale MD  Encounter Date: 08/23/2016      PT End of Session - 08/23/16 1154    Visit Number 8   Number of Visits 18   Date for PT Re-Evaluation 09/19/16   PT Start Time 1058   PT Stop Time 1143   PT Time Calculation (min) 45 min   Activity Tolerance Patient tolerated treatment well   Behavior During Therapy [facility] for tasks assessed/performed      Past Medical History:  Diagnosis Date  . Anemia   . Arthritis    hips  . Atrial fibrillation ([place])   . Carpal tunnel syndrome    left leg sciatica  . Diabetes mellitus   . Hearing loss    left ear greater loss  . History of shingles    past hx. left hand  . Hyperlipidemia   . Hypertension    EKG, chest x ray 11/15/10 EPIC,  clearance Dr Sharlene Motts on chart/ pt states on meds to "prevent hypertension from diabetes"  . Paget's disease    scrotum  . Paroxysmal atrial fibrillation ([place]) 05/18/2016  . Positive PPD, treated     Past Surgical History:  Procedure Laterality Date  . CERVICAL FUSION     dr Vertell Limber 2009- retained hardware  . colonoscopy  2-08   per Dr. Oletta Lamas, normal   . EP IMPLANTABLE DEVICE N/A 05/28/2016   Procedure: Pacemaker Implant;  Surgeon: Will Meredith Leeds, MD;  Location: [facility];  Service: Cardiovascular;  Laterality: N/A;  . ERCP N/A 05/22/2016   Procedure: ENDOSCOPIC RETROGRADE CHOLANGIOPANCREATOGRAPHY (ERCP);  Surgeon: Milus Banister, MD;  Location: [facility];  Service: Endoscopy;  Laterality: N/A;  . EYE SURGERY     bilateral cataract extraction with IOL  . HIP FRACTURE SURGERY  11-24-10   left hip per Dr, [name]  . LUMBAR LAMINECTOMY/DECOMPRESSION MICRODISCECTOMY  11/07/2011   Procedure: LUMBAR  LAMINECTOMY/DECOMPRESSION MICRODISCECTOMY;  Surgeon: Johnn Hai, MD;  Location: [facility];  Service: Orthopedics;  Laterality: N/A;  Decompression of the L4 - L5 Central (X-ray)   . partial removal of scrotum    . TONSILLECTOMY    . TOTAL HIP REVISION Left 11/05/2012   Procedure: Conversion of a Bipolar to a Left Total Hip Arthroplasty;  Surgeon: [name], MD;  Location: [facility];  Service: Orthopedics;  Laterality: Left;  Conversion of a Bipolar to a Left Total Hip Arthroplasty    There were no vitals filed for this visit.      Subjective Assessment - 08/23/16 1056    Subjective "I have been good. I was a litle sore after last visit."    Currently in Pain? Yes   Pain Score 3    Pain Location Knee   Pain Orientation Right   Pain Descriptors / Indicators Sore   Pain Type Chronic pain                         [facility] Adult PT Treatment/Exercise - 08/23/16 0001      Lumbar Exercises: Stretches   Passive Hamstring Stretch 3 reps;30 seconds     Lumbar Exercises: Aerobic   Stationary Bike Nu-Step L4 x 6 min  UE/LE     Lumbar Exercises:  Seated   Sit to Stand 5 reps   Sit to Stand Limitations verbal cues on proper mechanics     Lumbar Exercises: Supine   Clam 15 reps  2 sets with red theraband   Bridge 10 reps;5 seconds  feet on red physioball   Other Supine Lumbar Exercises double knee to chest 2x 15 with feet on physioball   Other Supine Lumbar Exercises decompression 5 minutes (3 pillows), shoulder, head, leg presses 5 x 5 seconds     Knee/Hip Exercises: Supine   Other Supine Knee/Hip Exercises modified table top 5 x 10 s with feet on physioball   Other Supine Knee/Hip Exercises supine marching 2 x 20  first set no weight, second set 2# ankle weights                  PT Short Term Goals - 08/20/16 1206      PT SHORT TERM GOAL #1   Title pt will be </= Min A with inital HEP (08/25/2016)   Baseline Min assist from his wife   Time 4    Period Weeks   Status Achieved     PT SHORT TERM GOAL #2   Title He will increase his Berg score to >/=33 to promote improvement in balance for safety (08/25/2016)   Time 4   Period Weeks   Status Unable to assess     PT SHORT TERM GOAL #3   Title he will improve bil abductor/ extensore hip strength to >/= 3+/5 to assist with standing and walking activites and promote safety (08/25/2016)   Time 4   Period Weeks   Status Unable to assess     PT SHORT TERM GOAL #4   Title he will be able to stand for >/= 10 min and walk for >/= 13 minutes using LRAD with </= mod postural sway to promote endurance and maintain safety   Time 4   Period Weeks   Status Unable to assess           PT Long Term Goals - 07/25/16 1809      PT LONG TERM GOAL #1   Title pt will be </= MIN A with all HEP given as of last visit (09/19/2016)   Time 8   Period Weeks   Status New     PT LONG TERM GOAL #2   Title pt will improve overall hip strength to >/= 4-/5 to assist community ambulation and endurace activities in standing (09/19/2016)   Time 8   Period Weeks   Status New     PT LONG TERM GOAL #3   Title He will improve his Berg Balance score to >/= 43 to demonstrate improvement in balance and safety (09/19/2016)   Time 8   Period Weeks   Status New     PT LONG TERM GOAL #4   Title pt will be able to walk/ stand for >/= 15-20 min with LRAD for safety and navigate >/= 10 steps with </= MIN A for safety to promote functional endurance and home/ community ambulatoin   Time 8   Period Weeks   Status New               Plan - 08/23/16 1156    Clinical Impression Statement Andrew Norton reports continued pain in his right knee at 3/10, but that it is not limiting him. reported that he has continued to do HEP and new exercises given last session. Focused on glute  and core strengthening today. He reported no increase in pain, but that his legs were tired afterwards. No modalities were used post session.     PT Next Visit Plan reassess BERG, decompression exercise, continue stretching, stabilization, hip strengthening, Balance exercises   PT Home Exercise Plan clamshells, heel/ toe raise, glute set, seated marching, decompression, calf stretch.   Consulted and Agree with Plan of Care Patient      Patient will benefit from skilled therapeutic intervention in order to improve the following deficits and impairments:  Abnormal gait, Decreased strength, Improper body mechanics, Postural dysfunction, Decreased activity tolerance, Decreased balance, Decreased endurance, Difficulty walking  Visit Diagnosis: Muscle weakness (generalized)  Unsteadiness on feet  Abnormal posture  Other abnormalities of gait and mobility     Problem List Patient Active Problem List   Diagnosis Date Noted  . Neuropathy ([place]) 05/31/2016  . Gallbladder & bile duct stone with obstruction   . Tachy-brady syndrome ([place]) 05/21/2016  . Sinus pause 05/21/2016  . NSVT (nonsustained ventricular tachycardia) ([place]) 05/21/2016  . Bile duct obstruction   . Abnormal CT of the abdomen   . Paroxysmal atrial fibrillation ([place]) 05/18/2016  . Protein-calorie malnutrition, severe 05/18/2016  . Jaundice 05/17/2016  . Diabetes mellitus type 2, noninsulin dependent ([place]) 05/17/2016  . LFT elevation 05/17/2016  . Leukocytosis 05/17/2016  . 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 Constipation 07/18/2015  . Aspiration pneumonia ([place]) 04/12/2015  . Sepsis ([place]) 04/12/2015  . Hyperlipidemia 04/12/2015  . Diabetes mellitus type 2 with complications ([place]) 123456  . Lobar pneumonia due to unspecified organism 04/12/2015  . Cervical myelopathy ([place]) 11/10/2013  . Cervical disc disorder with radiculopathy of cervical region 07/01/2013  . Abnormality of gait 07/01/2013  . Unspecified arthropathy, pelvic region and thigh 11/27/2012  . Type II or unspecified type diabetes mellitus with neurological manifestations, not stated as  uncontrolled(250.60) 11/27/2012  . Unspecified hereditary and idiopathic peripheral neuropathy 11/27/2012  . Acute posthemorrhagic anemia 11/27/2012  . Pain with hip hemiarthroplasty ([place]) 11/05/2012  . HYPERKALEMIA 10/26/2009  . Vitamin B12 deficiency 09/27/2009  . CARPAL TUNNEL SYNDROME 09/27/2009  . 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 CHEST PAIN 12/01/2008  . NECK PAIN, CHRONIC 07/01/2008  . METHYLMALONIC ACIDEMIA 02/09/2008  . ANXIETY STATE, UNSPECIFIED 12/29/2007  . LEG CRAMPS, NOCTURNAL 12/29/2007  . SHINGLES 12/15/2007  . HEARING LOSS 08/11/2007  . DYSFUNCTION OF EUSTACHIAN TUBE 08/04/2007  . BACK PAIN, LUMBAR 07/30/2007  . Essential hypertension 07/21/2007  . ACUTE BRONCHITIS 07/21/2007  . HYPERLIPIDEMIA 05/12/2007  . Disorder resulting from impaired renal function 05/12/2007  . ANEMIA-NOS 12/11/2006  . HAY FEVER 12/11/2006  . PAGET'S DISEASE 12/11/2006  . TB SKIN TEST, POSITIVE 12/11/2006    Andrew Norton, Andrew Norton 08/23/2016, 12:02 PM  [facility] [address] [place], [place], [address] Phone: [phone]   Fax:  [phone]  Name: Andrew Norton MRN: [id number] Date of Birth: 05-14-1927

## 2016-08-27 ENCOUNTER — Ambulatory Visit: Payer: Medicare Other | Admitting: Physical Therapy

## 2016-08-27 DIAGNOSIS — M6281 Muscle weakness (generalized): Secondary | ICD-10-CM | POA: Diagnosis not present

## 2016-08-27 DIAGNOSIS — R2689 Other abnormalities of gait and mobility: Secondary | ICD-10-CM

## 2016-08-27 DIAGNOSIS — R293 Abnormal posture: Secondary | ICD-10-CM

## 2016-08-27 DIAGNOSIS — R2681 Unsteadiness on feet: Secondary | ICD-10-CM | POA: Diagnosis not present

## 2016-08-27 NOTE — Therapy (Signed)
[place], [place], [address] Phone: [phone]   Fax:  [phone]  Physical Therapy Treatment  Patient Details  Name: Andrew Norton MRN: [id number] Date of Birth: [birth year] Referring Provider: Laurey Morale MD  Encounter Date: 08/27/2016      PT End of Session - 08/27/16 1146    Visit Number 9   Number of Visits 18   Date for PT Re-Evaluation 09/19/16   Authorization Type Medicare: Kx mod by 15th visit, Progress note by 10th visit   PT Start Time 1100   PT Stop Time 1145   PT Time Calculation (min) 45 min   Activity Tolerance Patient tolerated treatment well   Behavior During Therapy [facility] for tasks assessed/performed      Past Medical History:  Diagnosis Date  . Anemia   . Arthritis    hips  . Atrial fibrillation ([place])   . Carpal tunnel syndrome    left leg sciatica  . Diabetes mellitus   . Hearing loss    left ear greater loss  . History of shingles    past hx. left hand  . Hyperlipidemia   . Hypertension    EKG, chest x ray 11/15/10 EPIC,  clearance Dr Sharlene Motts on chart/ pt states on meds to "prevent hypertension from diabetes"  . Paget's disease    scrotum  . Paroxysmal atrial fibrillation ([place]) 05/18/2016  . Positive PPD, treated     Past Surgical History:  Procedure Laterality Date  . CERVICAL FUSION     dr Vertell Limber 2009- retained hardware  . colonoscopy  2-08   per Dr. Oletta Lamas, normal   . EP IMPLANTABLE DEVICE N/A 05/28/2016   Procedure: Pacemaker Implant;  Surgeon: Will Meredith Leeds, MD;  Location: [facility];  Service: Cardiovascular;  Laterality: N/A;  . ERCP N/A 05/22/2016   Procedure: ENDOSCOPIC RETROGRADE CHOLANGIOPANCREATOGRAPHY (ERCP);  Surgeon: Milus Banister, MD;  Location: [facility];  Service: Endoscopy;  Laterality: N/A;  . EYE SURGERY     bilateral cataract extraction with IOL  . HIP FRACTURE SURGERY  11-24-10   left hip per Dr, [name]  . LUMBAR  LAMINECTOMY/DECOMPRESSION MICRODISCECTOMY  11/07/2011   Procedure: LUMBAR LAMINECTOMY/DECOMPRESSION MICRODISCECTOMY;  Surgeon: Johnn Hai, MD;  Location: [facility];  Service: Orthopedics;  Laterality: N/A;  Decompression of the L4 - L5 Central (X-ray)   . partial removal of scrotum    . TONSILLECTOMY    . TOTAL HIP REVISION Left 11/05/2012   Procedure: Conversion of a Bipolar to a Left Total Hip Arthroplasty;  Surgeon: [name], MD;  Location: [facility];  Service: Orthopedics;  Laterality: Left;  Conversion of a Bipolar to a Left Total Hip Arthroplasty    There were no vitals filed for this visit.      Subjective Assessment - 08/27/16 1107    Subjective Balance in improving, but ti has a long way to go.    Currently in Pain? Yes   Pain Score 3    Pain Location Knee   Pain Orientation Right   Pain Descriptors / Indicators Sore   Aggravating Factors  not sure   Pain Relieving Factors rest   Multiple Pain Sites No                         [facility] Adult PT Treatment/Exercise - 08/27/16 0001      Berg Balance Test   Sit to Stand  Able to stand  independently using hands   Standing Unsupported Able to stand safely 2 minutes   Sitting with Back Unsupported but Feet Supported on Floor or Stool Able to sit safely and securely 2 minutes   Stand to Sit Controls descent by using hands   Transfers Able to transfer safely, definite need of hands   Standing Unsupported with Eyes Closed Able to stand 10 seconds with supervision   Standing Ubsupported with Feet Together Able to place feet together independently and stand for 1 minute with supervision   From Standing, Reach Forward with Outstretched Arm Can reach forward >5 cm safely (2")   From Standing Position, Pick up Object from [place] to pick up shoe, needs supervision   From Standing Position, Turn to Look Behind Over each Shoulder Turn sideways only but maintains balance   Turn 360 Degrees Able to turn 360 degrees  safely but slowly   Standing Unsupported, Alternately Place Feet on Step/Stool Able to complete >2 steps/needs minimal assist   Standing Unsupported, One Foot in Front Needs help to step but can hold 15 seconds   Standing on One Leg Tries to lift leg/unable to hold 3 seconds but remains standing independently   Total Score 35     Lumbar Exercises: Aerobic   Stationary Bike Nustep L5 X 12 minutes                  PT Short Term Goals - 08/27/16 1134      PT SHORT TERM GOAL #1   Title pt will be </= Min A with inital HEP (08/25/2016)   Time 4   Period Weeks   Status Achieved     PT SHORT TERM GOAL #2   Title He will increase his Berg score to >/=33 to promote improvement in balance for safety (08/25/2016)   Baseline 35/56   Time 4   Period Weeks   Status Achieved     PT SHORT TERM GOAL #3   Title he will improve bil abductor/ extensore hip strength to >/= 3+/5 to assist with standing and walking activites and promote safety (08/25/2016)   Time 4   Period Weeks   Status Unable to assess     PT SHORT TERM GOAL #4   Title he will be able to stand for >/= 10 min and walk for >/= 13 minutes using LRAD with </= mod postural sway to promote endurance and maintain safety   Baseline able to push cart in [employer] store 30 minutes with fatigue,  He would hav been unable to do prior to PT   Time 4   Period Weeks   Status Partially Met           PT Long Term Goals - 07/25/16 1809      PT LONG TERM GOAL #1   Title pt will be </= MIN A with all HEP given as of last visit (09/19/2016)   Time 8   Period Weeks   Status New     PT LONG TERM GOAL #2   Title pt will improve overall hip strength to >/= 4-/5 to assist community ambulation and endurace activities in standing (09/19/2016)   Time 8   Period Weeks   Status New     PT LONG TERM GOAL #3   Title He will improve his Berg Balance score to >/= 43 to demonstrate improvement in balance and safety (09/19/2016)   Time 8   Period  Weeks  Status New     PT LONG TERM GOAL #4   Title pt will be able to walk/ stand for >/= 15-20 min with LRAD for safety and navigate >/= 10 steps with </= MIN A for safety to promote functional endurance and home/ community ambulatoin   Time 8   Period Weeks   Status New               Plan - 08/27/16 1139    Clinical Impression Statement STG # 2 met,  STG#4 partially met.  BERG  35/56.  No pain increased during session.  Patiewnt see's the cardiologist next week and hopes he will be gfiven permission to drive.  he is more dfunctional at home, now able to vacume and in  community, able to walk 30 minutes in the gro[employer] store,   PT Next Visit Plan MMT hip, decompression exercise, continue stretching, stabilization, hip strengthening, Balance exercises   PT Home Exercise Plan clamshells, heel/ toe raise, glute set, seated marching, decompression, calf stretch.   Consulted and Agree with Plan of Care Patient      Patient will benefit from skilled therapeutic intervention in order to improve the following deficits and impairments:  Abnormal gait, Decreased strength, Improper body mechanics, Postural dysfunction, Decreased activity tolerance, Decreased balance, Decreased endurance, Difficulty walking  Visit Diagnosis: Muscle weakness (generalized)  Unsteadiness on feet  Abnormal posture  Other abnormalities of gait and mobility     Problem List Patient Active Problem List   Diagnosis Date Noted  . Neuropathy ([place]) 05/31/2016  . Gallbladder & bile duct stone with obstruction   . Tachy-brady syndrome ([place]) 05/21/2016  . Sinus pause 05/21/2016  . NSVT (nonsustained ventricular tachycardia) ([place]) 05/21/2016  . Bile duct obstruction   . Abnormal CT of the abdomen   . Paroxysmal atrial fibrillation ([place]) 05/18/2016  . Protein-calorie malnutrition, severe 05/18/2016  . Jaundice 05/17/2016  . Diabetes mellitus type 2, noninsulin dependent ([place]) 05/17/2016  . LFT elevation  05/17/2016  . Leukocytosis 05/17/2016  . 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 Constipation 07/18/2015  . Aspiration pneumonia ([place]) 04/12/2015  . Sepsis ([place]) 04/12/2015  . Hyperlipidemia 04/12/2015  . Diabetes mellitus type 2 with complications ([place]) 26/20/3559  . Lobar pneumonia due to unspecified organism 04/12/2015  . Cervical myelopathy ([place]) 11/10/2013  . Cervical disc disorder with radiculopathy of cervical region 07/01/2013  . Abnormality of gait 07/01/2013  . Unspecified arthropathy, pelvic region and thigh 11/27/2012  . Type II or unspecified type diabetes mellitus with neurological manifestations, not stated as uncontrolled(250.60) 11/27/2012  . Unspecified hereditary and idiopathic peripheral neuropathy 11/27/2012  . Acute posthemorrhagic anemia 11/27/2012  . Pain with hip hemiarthroplasty ([place]) 11/05/2012  . HYPERKALEMIA 10/26/2009  . Vitamin B12 deficiency 09/27/2009  . CARPAL TUNNEL SYNDROME 09/27/2009  . 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 CHEST PAIN 12/01/2008  . NECK PAIN, CHRONIC 07/01/2008  . METHYLMALONIC ACIDEMIA 02/09/2008  . ANXIETY STATE, UNSPECIFIED 12/29/2007  . LEG CRAMPS, NOCTURNAL 12/29/2007  . SHINGLES 12/15/2007  . HEARING LOSS 08/11/2007  . DYSFUNCTION OF EUSTACHIAN TUBE 08/04/2007  . BACK PAIN, LUMBAR 07/30/2007  . Essential hypertension 07/21/2007  . ACUTE BRONCHITIS 07/21/2007  . HYPERLIPIDEMIA 05/12/2007  . Disorder resulting from impaired renal function 05/12/2007  . ANEMIA-NOS 12/11/2006  . HAY FEVER 12/11/2006  . PAGET'S DISEASE 12/11/2006  . TB SKIN TEST, POSITIVE 12/11/2006    Khani Paino PTA 08/27/2016, 11:47 AM  [place], [place],  [address] Phone: [phone]   Fax:  [phone]  Name: AMADI FRADY MRN: [id number] Date of Birth: [birth year]

## 2016-08-28 ENCOUNTER — Ambulatory Visit (INDEPENDENT_AMBULATORY_CARE_PROVIDER_SITE_OTHER): Payer: Medicare Other | Admitting: Family Medicine

## 2016-08-28 DIAGNOSIS — E538 Deficiency of other specified B group vitamins: Secondary | ICD-10-CM | POA: Diagnosis not present

## 2016-08-28 MED ORDER — CYANOCOBALAMIN 1000 MCG/ML IJ SOLN
1000.0000 ug | Freq: Once | INTRAMUSCULAR | Status: AC
Start: 2016-08-28 — End: 2016-08-28
  Administered 2016-08-28: 1000 ug via INTRAMUSCULAR

## 2016-08-29 ENCOUNTER — Ambulatory Visit: Payer: Medicare Other | Admitting: Neurology

## 2016-08-29 ENCOUNTER — Ambulatory Visit: Payer: Medicare Other | Admitting: Physical Therapy

## 2016-08-29 DIAGNOSIS — R2681 Unsteadiness on feet: Secondary | ICD-10-CM | POA: Diagnosis not present

## 2016-08-29 DIAGNOSIS — R293 Abnormal posture: Secondary | ICD-10-CM | POA: Diagnosis not present

## 2016-08-29 DIAGNOSIS — R2689 Other abnormalities of gait and mobility: Secondary | ICD-10-CM

## 2016-08-29 DIAGNOSIS — M6281 Muscle weakness (generalized): Secondary | ICD-10-CM | POA: Diagnosis not present

## 2016-08-29 NOTE — Therapy (Signed)
[place], [place], [address] Phone: [phone]   Fax:  [phone]  Physical Therapy Treatment  Patient Details  Name: Andrew Norton MRN: [id number] Date of Birth: 03/09/1927 Referring Provider: Laurey Morale MD  Encounter Date: 08/29/2016      PT End of Session - 08/29/16 1151    Visit Number 10   Number of Visits 18   Date for PT Re-Evaluation 09/19/16   PT Start Time 1100   PT Stop Time 1145   PT Time Calculation (min) 45 min   Activity Tolerance Patient tolerated treatment well   Behavior During Therapy [facility] for tasks assessed/performed      Past Medical History:  Diagnosis Date  . Anemia   . Arthritis    hips  . Atrial fibrillation ([place])   . Carpal tunnel syndrome    left leg sciatica  . Diabetes mellitus   . Hearing loss    left ear greater loss  . History of shingles    past hx. left hand  . Hyperlipidemia   . Hypertension    EKG, chest x ray 11/15/10 EPIC,  clearance Dr Sharlene Motts on chart/ pt states on meds to "prevent hypertension from diabetes"  . Paget's disease    scrotum  . Paroxysmal atrial fibrillation ([place]) 05/18/2016  . Positive PPD, treated     Past Surgical History:  Procedure Laterality Date  . CERVICAL FUSION     dr Vertell Limber 2009- retained hardware  . colonoscopy  2-08   per Dr. Oletta Lamas, normal   . EP IMPLANTABLE DEVICE N/A 05/28/2016   Procedure: Pacemaker Implant;  Surgeon: Will Meredith Leeds, MD;  Location: [facility];  Service: Cardiovascular;  Laterality: N/A;  . ERCP N/A 05/22/2016   Procedure: ENDOSCOPIC RETROGRADE CHOLANGIOPANCREATOGRAPHY (ERCP);  Surgeon: Milus Banister, MD;  Location: [facility];  Service: Endoscopy;  Laterality: N/A;  . EYE SURGERY     bilateral cataract extraction with IOL  . HIP FRACTURE SURGERY  11-24-10   left hip per Dr, [name]  . LUMBAR LAMINECTOMY/DECOMPRESSION MICRODISCECTOMY  11/07/2011   Procedure: LUMBAR  LAMINECTOMY/DECOMPRESSION MICRODISCECTOMY;  Surgeon: Johnn Hai, MD;  [facility];  Service: Orthopedics;  Laterality: N/A;  Decompression of the L4 - L5 Central (X-ray)   . partial removal of scrotum    . TONSILLECTOMY    . TOTAL HIP REVISION Left 11/05/2012   Procedure: Conversion of a Bipolar to a Left Total Hip Arthroplasty;  Surgeon: [name], MD;  Location: [facility];  Service: Orthopedics;  Laterality: Left;  Conversion of a Bipolar to a Left Total Hip Arthroplasty    There were no vitals filed for this visit.      Subjective Assessment - 08/29/16 1102    Subjective "I have been doing good, Im just having my regular ache in my knee. Getting around seems to be a little easier."   Currently in Pain? Yes   Pain Score 3    Pain Location Knee   Pain Orientation Right   Pain Descriptors / Indicators Aching;Sore   Pain Onset More than a month ago   Pain Frequency Intermittent                         [facility] Adult PT Treatment/Exercise - 08/29/16 1109      Lumbar Exercises: Stretches   Passive Hamstring Stretch 3 reps;30 seconds   Single Knee to Chest Stretch  3 reps;30 seconds     Lumbar Exercises: Aerobic   Stationary Bike L5 x 10 min  UE/LE     Lumbar Exercises: Supine   Clam 15 reps  2 sets with red band   Bridge 10 reps;5 seconds  calves on red physioball     Knee/Hip Exercises: Seated   Long Arc Quad Both;15 reps;Weights  2# ankle weights   Hamstring Curl Both;15 reps  red band     Knee/Hip Exercises: Supine   Other Supine Knee/Hip Exercises supine marching 2 x 20  with 1# ankle weights                  PT Short Term Goals - 08/27/16 1134      PT SHORT TERM GOAL #1   Title pt will be </= Min A with inital HEP (08/25/2016)   Time 4   Period Weeks   Status Achieved     PT SHORT TERM GOAL #2   Title He will increase his Berg score to >/=33 to promote improvement in balance for safety (08/25/2016)   Baseline 35/56    Time 4   Period Weeks   Status Achieved     PT SHORT TERM GOAL #3   Title he will improve bil abductor/ extensore hip strength to >/= 3+/5 to assist with standing and walking activites and promote safety (08/25/2016)   Time 4   Period Weeks   Status Unable to assess     PT SHORT TERM GOAL #4   Title he will be able to stand for >/= 10 min and walk for >/= 13 minutes using LRAD with </= mod postural sway to promote endurance and maintain safety   Baseline able to push cart in [employer] store 30 minutes with fatigue,  He would hav been unable to do prior to PT   Time 4   Period Weeks   Status Partially Met           PT Long Term Goals - 07/25/16 1809      PT LONG TERM GOAL #1   Title pt will be </= MIN A with all HEP given as of last visit (09/19/2016)   Time 8   Period Weeks   Status New     PT LONG TERM GOAL #2   Title pt will improve overall hip strength to >/= 4-/5 to assist community ambulation and endurace activities in standing (09/19/2016)   Time 8   Period Weeks   Status New     PT LONG TERM GOAL #3   Title He will improve his Berg Balance score to >/= 43 to demonstrate improvement in balance and safety (09/19/2016)   Time 8   Period Weeks   Status New     PT LONG TERM GOAL #4   Title pt will be able to walk/ stand for >/= 15-20 min with LRAD for safety and navigate >/= 10 steps with </= MIN A for safety to promote functional endurance and home/ community ambulatoin   Time 8   Period Weeks   Status New              G-Codes - 06-Sep-2016 1201    Functional Assessment Tool Used clinical judement/ BERG   Functional Limitation Mobility: Walking and moving around   Mobility: Walking and Moving Around Current Status [phone]) At least 40 percent but less than 60 percent impaired, limited or restricted   Mobility: Walking and Moving Around Goal Status (C7893) At  least 40 percent but less than 60 percent impaired, limited or restricted            Plan - 08/29/16  1152    Clinical Impression Statement [name] reports that he feels that his balance has gotten better, but that he has more work to do. Focused on hip strengthening. He reported no increase in pain during exercise but that his legs felt tired today. Declined modalities post session.    PT Next Visit Plan MMT hip, decompression exercise, continue stretching, stabilization, hip strengthening, Balance exercises   PT Home Exercise Plan clamshells, heel/ toe raise, glute set, seated marching, decompression, calf stretch.   Consulted and Agree with Plan of Care Patient      Patient will benefit from skilled therapeutic intervention in order to improve the following deficits and impairments:  Abnormal gait, Decreased strength, Improper body mechanics, Postural dysfunction, Decreased activity tolerance, Decreased balance, Decreased endurance, Difficulty walking  Visit Diagnosis: Muscle weakness (generalized)  Unsteadiness on feet  Abnormal posture  Other abnormalities of gait and mobility     Problem List Patient Active Problem List   Diagnosis Date Noted  . Neuropathy ([place]) 05/31/2016  . Gallbladder & bile duct stone with obstruction   . Tachy-brady syndrome ([place]) 05/21/2016  . Sinus pause 05/21/2016  . NSVT (nonsustained ventricular tachycardia) ([place]) 05/21/2016  . Bile duct obstruction   . Abnormal CT of the abdomen   . Paroxysmal atrial fibrillation ([place]) 05/18/2016  . Protein-calorie malnutrition, severe 05/18/2016  . Jaundice 05/17/2016  . Diabetes mellitus type 2, noninsulin dependent ([place]) 05/17/2016  . LFT elevation 05/17/2016  . Leukocytosis 05/17/2016  . 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 Constipation 07/18/2015  . Aspiration pneumonia ([place]) 04/12/2015  . Sepsis ([place]) 04/12/2015  . Hyperlipidemia 04/12/2015  . Diabetes mellitus type 2 with complications ([place]) 67/89/3810  . Lobar pneumonia due to unspecified organism 04/12/2015  . Cervical myelopathy ([place])  11/10/2013  . Cervical disc disorder with radiculopathy of cervical region 07/01/2013  . Abnormality of gait 07/01/2013  . Unspecified arthropathy, pelvic region and thigh 11/27/2012  . Type II or unspecified type diabetes mellitus with neurological manifestations, not stated as uncontrolled(250.60) 11/27/2012  . Unspecified hereditary and idiopathic peripheral neuropathy 11/27/2012  . Acute posthemorrhagic anemia 11/27/2012  . Pain with hip hemiarthroplasty ([place]) 11/05/2012  . HYPERKALEMIA 10/26/2009  . Vitamin B12 deficiency 09/27/2009  . CARPAL TUNNEL SYNDROME 09/27/2009  . 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 CHEST PAIN 12/01/2008  . NECK PAIN, CHRONIC 07/01/2008  . METHYLMALONIC ACIDEMIA 02/09/2008  . ANXIETY STATE, UNSPECIFIED 12/29/2007  . LEG CRAMPS, NOCTURNAL 12/29/2007  . SHINGLES 12/15/2007  . HEARING LOSS 08/11/2007  . DYSFUNCTION OF EUSTACHIAN TUBE 08/04/2007  . BACK PAIN, LUMBAR 07/30/2007  . Essential hypertension 07/21/2007  . ACUTE BRONCHITIS 07/21/2007  . HYPERLIPIDEMIA 05/12/2007  . Disorder resulting from impaired renal function 05/12/2007  . ANEMIA-NOS 12/11/2006  . HAY FEVER 12/11/2006  . PAGET'S DISEASE 12/11/2006  . TB SKIN TEST, POSITIVE 12/11/2006    Alda Ponder, SPT 08/29/2016, 11:57 AM  [facility] [address] [place], [place], [address] Phone: [phone]   Fax:  [phone]  Name: Andrew Norton MRN: [id number] Date of Birth: 11-18-1926

## 2016-08-30 ENCOUNTER — Ambulatory Visit (INDEPENDENT_AMBULATORY_CARE_PROVIDER_SITE_OTHER): Payer: Medicare Other | Admitting: Cardiology

## 2016-08-30 ENCOUNTER — Encounter: Payer: Self-pay | Admitting: Cardiology

## 2016-08-30 VITALS — BP 126/54 | HR 73 | Ht 70.0 in | Wt 158.2 lb

## 2016-08-30 DIAGNOSIS — I495 Sick sinus syndrome: Secondary | ICD-10-CM | POA: Diagnosis not present

## 2016-08-30 DIAGNOSIS — Z950 Presence of cardiac pacemaker: Secondary | ICD-10-CM | POA: Diagnosis not present

## 2016-08-30 LAB — CUP PACEART INCLINIC DEVICE CHECK
Battery Remaining Longevity: 134 mo
Battery Voltage: 3.06 V
Brady Statistic AP VP Percent: 0.04 %
Brady Statistic AS VS Percent: 68.18 %
Brady Statistic RV Percent Paced: 0.07 %
Date Time Interrogation Session: 20180215163719
Implantable Lead Implant Date: 20171113
Implantable Lead Implant Date: 20171113
Implantable Lead Location: 753859
Implantable Lead Model: 5076
Implantable Pulse Generator Implant Date: 20171113
Lead Channel Impedance Value: 323 Ohm
Lead Channel Impedance Value: 380 Ohm
Lead Channel Impedance Value: 437 Ohm
Lead Channel Impedance Value: 437 Ohm
Lead Channel Impedance Value: 494 Ohm
Lead Channel Pacing Threshold Pulse Width: 0.4 ms
Lead Channel Sensing Intrinsic Amplitude: 1.125 mV
Lead Channel Sensing Intrinsic Amplitude: 1.375 mV
Lead Channel Sensing Intrinsic Amplitude: 1.375 mV
Lead Channel Sensing Intrinsic Amplitude: 11.125 mV
Lead Channel Sensing Intrinsic Amplitude: 5 mV
Lead Channel Sensing Intrinsic Amplitude: 5.25 mV
Lead Channel Setting Pacing Amplitude: 2.5 V
Lead Channel Setting Pacing Amplitude: 4 V
Lead Channel Setting Sensing Sensitivity: 1.2 mV
MDC IDC LEAD LOCATION: 753860
MDC IDC MSMT LEADCHNL RA IMPEDANCE VALUE: 342 Ohm
MDC IDC MSMT LEADCHNL RA IMPEDANCE VALUE: 399 Ohm
MDC IDC MSMT LEADCHNL RA PACING THRESHOLD AMPLITUDE: 1.5 V
MDC IDC MSMT LEADCHNL RA SENSING INTR AMPL: 1.5 mV
MDC IDC MSMT LEADCHNL RV IMPEDANCE VALUE: 437 Ohm
MDC IDC MSMT LEADCHNL RV PACING THRESHOLD AMPLITUDE: 0.875 V
MDC IDC MSMT LEADCHNL RV PACING THRESHOLD AMPLITUDE: 2.375 V
MDC IDC MSMT LEADCHNL RV PACING THRESHOLD PULSEWIDTH: 0.4 ms
MDC IDC MSMT LEADCHNL RV PACING THRESHOLD PULSEWIDTH: 0.4 ms
MDC IDC MSMT LEADCHNL RV SENSING INTR AMPL: 13.125 mV
MDC IDC SET LEADCHNL RV PACING PULSEWIDTH: 0.8 ms
MDC IDC STAT BRADY AP VS PERCENT: 31.75 %
MDC IDC STAT BRADY AS VP PERCENT: 0.04 %
MDC IDC STAT BRADY RA PERCENT PACED: 31.62 %

## 2016-08-30 MED ORDER — METOPROLOL TARTRATE 25 MG PO TABS: 25.0000 mg | ORAL_TABLET | Freq: Two times a day (BID) | ORAL | 3 refills | Status: DC

## 2016-08-30 MED ORDER — FLECAINIDE ACETATE 50 MG PO TABS: 50.0000 mg | ORAL_TABLET | Freq: Two times a day (BID) | ORAL | 3 refills | Status: DC

## 2016-08-30 MED ORDER — RIVAROXABAN 20 MG PO TABS: 20.0000 mg | ORAL_TABLET | Freq: Every day | ORAL | 3 refills | Status: DC

## 2016-08-30 NOTE — Patient Instructions (Addendum)
Medication Instructions:   Your physician has recommended you make the following change in your medication: 1) STOP Aspirin 2) INCREASE Metoprolol (Lopressor) to 25 mg twice daily  --- If you need a refill on your cardiac medications before your next appointment, please call your pharmacy. ---  Labwork:  None ordered  Testing/Procedures:  None ordered  Follow-Up: Remote monitoring is used to monitor your Pacemaker of ICD from home. This monitoring reduces the number of office visits required to check your device to one time per year. It allows [place] to keep an eye on the functioning of your device to ensure it is working properly. You are scheduled for a device check from home on 11/29/2016. You may send your transmission at any time that day. If you have a wireless device, the transmission will be sent automatically. After your physician reviews your transmission, you will receive a postcard with your next transmission date.  Call the device clinic [phone]) with the serial number to your monitor.    Your physician wants you to follow-up in: 6 months with Dr. Curt Bears.  You will receive a reminder letter in the mail two months in advance. If you don't receive a letter, please call our office to schedule the follow-up appointment.  Thank you for choosing [facility]!!   [name], RN [phone]

## 2016-08-30 NOTE — Progress Notes (Signed)
Electrophysiology Office Note   Date:  08/30/2016   ID:  Andrew Norton, Andrew Norton May 13, 1927, MRN [id number]  PCP:  Alysia Penna, MD  Cardiologist:  Tamala Julian Primary Electrophysiologist:  Raina Sole Meredith Leeds, MD    Chief Complaint  Patient presents with  . Pacemaker Check    Tach/Brady     History of Present Illness: Andrew Norton is a 83 y.o. male who presents today for electrophysiology evaluation.   History of HTN, DMT2, cervical myelopathy with gain instability who was directly admitted to [facility] on 05/17/16 for new onset of jaundice and abnormal CT ab/pel scan which showed "intrahepatic biliary ductal dilatation." He went into atrial fibrillation with rapid rates while in the hospital, and was placed on flecainide. He also had evidence of bradycardia, and thus had a Medtronic dual chamber pacemaker placed on 05/28/16.   Today, he denies symptoms of palpitations, chest pain, shortness of breath, orthopnea, PND, lower extremity edema, claudication, dizziness, presyncope, syncope, bleeding, or neurologic sequela. The patient is tolerating medications without difficulties and is otherwise without complaint today. He does say that he has some episodes of fatigue, and is short of breath when working rehabilitation. Otherwise he is doing well.   Past Medical History:  Diagnosis Date  . Anemia   . Arthritis    hips  . Atrial fibrillation ([place])   . Carpal tunnel syndrome    left leg sciatica  . Diabetes mellitus   . Hearing loss    left ear greater loss  . History of shingles    past hx. left hand  . Hyperlipidemia   . Hypertension    EKG, chest x ray 11/15/10 EPIC,  clearance Dr Sharlene Motts on chart/ pt states on meds to "prevent hypertension from diabetes"  . Paget's disease    scrotum  . Paroxysmal atrial fibrillation ([place]) 05/18/2016  . Positive PPD, treated    Past Surgical History:  Procedure Laterality Date  . CERVICAL FUSION     dr Vertell Limber 2009- retained hardware  . colonoscopy  2-08   per  Dr. Oletta Lamas, normal   . EP IMPLANTABLE DEVICE N/A 05/28/2016   Procedure: Pacemaker Implant;  Surgeon: Rebeca Valdivia Meredith Leeds, MD;  Location: [facility];  Service: Cardiovascular;  Laterality: N/A;  . ERCP N/A 05/22/2016   Procedure: ENDOSCOPIC RETROGRADE CHOLANGIOPANCREATOGRAPHY (ERCP);  Surgeon: Milus Banister, MD;  Location: [facility];  Service: Endoscopy;  Laterality: N/A;  . EYE SURGERY     bilateral cataract extraction with IOL  . HIP FRACTURE SURGERY  11-24-10   left hip per Dr, [name]  . LUMBAR LAMINECTOMY/DECOMPRESSION MICRODISCECTOMY  11/07/2011   Procedure: LUMBAR LAMINECTOMY/DECOMPRESSION MICRODISCECTOMY;  Surgeon: Johnn Hai, MD;  Location: [facility];  Service: Orthopedics;  Laterality: N/A;  Decompression of the L4 - L5 Central (X-ray)   . partial removal of scrotum    . TONSILLECTOMY    . TOTAL HIP REVISION Left 11/05/2012   Procedure: Conversion of a Bipolar to a Left Total Hip Arthroplasty;  Surgeon: [name], MD;  Location: [facility];  Service: Orthopedics;  Laterality: Left;  Conversion of a Bipolar to a Left Total Hip Arthroplasty     Current Outpatient Prescriptions  Medication Sig Dispense Refill  . aspirin 81 MG chewable tablet Chew 1 tablet (81 mg total) by mouth daily. 1 tablet 0  . cyanocobalamin (,VITAMIN B-12,) 1000 MCG/ML injection Inject 1,000 mcg into the muscle every 30 (thirty) days.    . cyclobenzaprine (FLEXERIL) 10 MG  tablet TAKE 1 TABLET 3 TIMES DAILYAS NEEDED FOR MUSCLE SPASMS 270 tablet 3  . Ferrous Sulfate 220 (44 FE) MG/5ML LIQD TAKE 7 MLS BY MOUTH DAILY 473 mL 3  . finasteride (PROSCAR) 5 MG tablet TAKE 1 TABLET DAILY 90 tablet 3  . flecainide (TAMBOCOR) 50 MG tablet Take 1 tablet (50 mg total) by mouth every 12 (twelve) hours. 60 tablet 2  . gabapentin (NEURONTIN) 400 MG capsule TAKE 1 CAPSULE 4 TIMES     DAILY 360 capsule 3  . glipiZIDE (GLUCOTROL) 10 MG tablet TAKE 1 TABLET TWICE A DAY  BEFORE MEALS 180 tablet 1  .  HYDROcodone-acetaminophen (NORCO/VICODIN) 5-325 MG tablet Take 1 tablet by mouth every 6 (six) hours as needed for moderate pain. 120 tablet 0  . lidocaine (LIDODERM) 5 % Place 1 patch onto the skin daily. Remove & Discard patch within 12 hours or as directed by MD 90 patch 3  . losartan (COZAAR) 50 MG tablet TAKE 1 TABLET EVERY MORNING 90 tablet 1  . metFORMIN (GLUCOPHAGE) 1000 MG tablet TAKE 1 TABLET TWICE A DAY  WITH MEALS 180 tablet 3  . metoprolol tartrate (LOPRESSOR) 25 MG tablet Take 0.5 tablets (12.5 mg total) by mouth 2 (two) times daily. 30 tablet 2  . NUTRITIONAL SUPPLEMENT LIQD Take 120 mLs by mouth 2 (two) times daily. SF MedPass for nutritional support    . polyethylene glycol (MIRALAX / GLYCOLAX) packet Take 17 g by mouth daily as needed for mild constipation.    . rivaroxaban (XARELTO) 20 MG TABS tablet Take 1 tablet (20 mg total) by mouth daily with supper. 30 tablet 2   No current facility-administered medications for this visit.     Allergies:   Penicillins and Ppd [tuberculin purified protein derivative]   Social History:  The patient  reports that he has never smoked. He has never used smokeless tobacco. He reports that he does not drink alcohol or use drugs.   Family History:  The patient's family history includes Heart failure in his father; Tuberculosis in his mother.    ROS:  Please see the history of present illness.   Otherwise, review of systems is positive for none.   All other systems are reviewed and negative.    PHYSICAL EXAM: VS:  BP (!) 126/54   Pulse 73   Ht 5\' 10"  (1.778 m)   Wt 158 lb 3.2 oz (71.8 kg)   SpO2 93%   BMI 22.70 kg/m  , BMI Body mass index is 22.7 kg/m. GEN: Well nourished, well developed, in no acute distress  HEENT: normal  Neck: no JVD, carotid bruits, or masses Cardiac: RRR; no murmurs, rubs, or gallops,no edema  Respiratory:  clear to auscultation bilaterally, normal work of breathing GI: soft, nontender, nondistended, +  BS MS: no deformity or atrophy  Skin: warm and dry,  device pocket is well healed Neuro:  Strength and sensation are intact Psych: euthymic mood, full affect  EKG:  EKG is ordered today. Personal review of the ekg ordered shows A pace, V sense, septal Q waves   Device interrogation is reviewed today in detail.  See PaceArt for details.   Recent Labs: 05/18/2016: TSH 2.356 05/30/2016: ALT 88; Magnesium 1.9 06/05/2016: BUN 23; Creatinine 0.8; Hemoglobin 8.4; Platelets 219; Potassium 4.9; Sodium 139    Lipid Panel     Component Value Date/Time   CHOL 152 10/05/2015 1112   TRIG 184.0 (H) 10/05/2015 1112   HDL 43.00 10/05/2015 1112  CHOLHDL 4 10/05/2015 1112   VLDL 36.8 10/05/2015 1112   LDLCALC 72 10/05/2015 1112     Wt Readings from Last 3 Encounters:  08/30/16 158 lb 3.2 oz (71.8 kg)  08/22/16 158 lb (71.7 kg)  06/27/16 151 lb (68.5 kg)      Other studies Reviewed: Additional studies/ records that were reviewed today include: TTE 05/19/16  Review of the above records today demonstrates:  - Left ventricle: The cavity size was normal. Wall thickness was   increased in a pattern of mild LVH. Systolic function was normal.   The estimated ejection fraction was in the range of 55% to 60%.   Wall motion was normal; there were no regional wall motion   abnormalities. Doppler parameters are consistent with abnormal   left ventricular relaxation (grade 1 diastolic dysfunction). - Aortic valve: There was trivial regurgitation. - Mitral valve: Calcified annulus. - Pulmonary arteries: Systolic pressure was mildly increased. PA   peak pressure: 52 mm Hg (S).   ASSESSMENT AND PLAN:  1.  Tachybradycardia syndrome: Status post Medtronic dual chamber pacemaker on 05/29/16. No abnormalities on interrogation today. Changes were made for long-term therapy.  2. Paroxysmal atrial fibrillation: On flecainide and metoprolol for rhythm control. Xarelto for stroke prevention. Currently in  sinus rhythm. He has had some episodes of atrial flutter. We'll increase his metoprolol to 25 mg twice daily.  This patients CHA2DS2-VASc Score and unadjusted Ischemic Stroke Rate (% per year) is equal to 4.8 % stroke rate/year from a score of 4  Above score calculated as 1 point each if present [CHF, HTN, DM, Vascular=MI/PAD/Aortic Plaque, Age if 65-74, or Male] Above score calculated as 2 points each if present [Age > 75, or Stroke/TIA/TE]   3. Hypertension:Well-controlled today. Continue current management  4. Hyperlipidemia: Continue statin    Current medicines are reviewed at length with the patient today.   The patient does not have concerns regarding his medicines.  The following changes were made today:  Increase metoprolol to 25 mg twice a day  Labs/ tests ordered today include:  No orders of the defined types were placed in this encounter.    Disposition:   FU with Teighan Aubert 6 months  Signed, Erendira Crabtree Meredith Leeds, MD  08/30/2016 10:48 AM     [facility] [address] [place]  [address] [phone] (office) [phone] (fax)

## 2016-08-31 ENCOUNTER — Encounter: Payer: Medicare Other | Admitting: Cardiology

## 2016-09-03 ENCOUNTER — Ambulatory Visit: Payer: Medicare Other | Admitting: Physical Therapy

## 2016-09-03 DIAGNOSIS — R2681 Unsteadiness on feet: Secondary | ICD-10-CM | POA: Diagnosis not present

## 2016-09-03 DIAGNOSIS — M6281 Muscle weakness (generalized): Secondary | ICD-10-CM | POA: Diagnosis not present

## 2016-09-03 DIAGNOSIS — R293 Abnormal posture: Secondary | ICD-10-CM

## 2016-09-03 DIAGNOSIS — R2689 Other abnormalities of gait and mobility: Secondary | ICD-10-CM

## 2016-09-03 NOTE — Patient Instructions (Signed)
Use ice on knee at home to assist with pain.

## 2016-09-03 NOTE — Therapy (Addendum)
[address] [place], [place], [address] Phone: [phone]   Fax:  [phone]  Physical Therapy Treatment  Patient Details  Name: Andrew Norton MRN: [id number] Date of Birth: 13-May-1927 Referring Provider: Laurey Morale MD  Encounter Date: 09/03/2016      PT End of Session - 09/03/16 1802    Visit Number 11   Number of Visits 18   Date for PT Re-Evaluation 09/19/16   PT Start Time 1502   PT Stop Time 1546   PT Time Calculation (min) 44 min   Activity Tolerance Patient tolerated treatment well   Behavior During Therapy [facility] for tasks assessed/performed      Past Medical History:  Diagnosis Date  . Anemia   . Arthritis    hips  . Atrial fibrillation ([place])   . Carpal tunnel syndrome    left leg sciatica  . Diabetes mellitus   . Hearing loss    left ear greater loss  . History of shingles    past hx. left hand  . Hyperlipidemia   . Hypertension    EKG, chest x ray 11/15/10 EPIC,  clearance Dr Sharlene Motts on chart/ pt states on meds to "prevent hypertension from diabetes"  . Paget's disease    scrotum  . Paroxysmal atrial fibrillation ([place]) 05/18/2016  . Positive PPD, treated     Past Surgical History:  Procedure Laterality Date  . CERVICAL FUSION     dr Vertell Limber 2009- retained hardware  . colonoscopy  2-08   per Dr. Oletta Lamas, normal   . EP IMPLANTABLE DEVICE N/A 05/28/2016   Procedure: Pacemaker Implant;  Surgeon: Will Meredith Leeds, MD;  Location: [facility];  Service: Cardiovascular;  Laterality: N/A;  . ERCP N/A 05/22/2016   Procedure: ENDOSCOPIC RETROGRADE CHOLANGIOPANCREATOGRAPHY (ERCP);  Surgeon: Milus Banister, MD;  Location: [facility];  Service: Endoscopy;  Laterality: N/A;  . EYE SURGERY     bilateral cataract extraction with IOL  . HIP FRACTURE SURGERY  11-24-10   left hip per Dr, [name]  . LUMBAR LAMINECTOMY/DECOMPRESSION MICRODISCECTOMY  11/07/2011   Procedure: LUMBAR  LAMINECTOMY/DECOMPRESSION MICRODISCECTOMY;  Surgeon: Johnn Hai, MD;  Location: [facility];  Service: Orthopedics;  Laterality: N/A;  Decompression of the L4 - L5 Central (X-ray)   . partial removal of scrotum    . TONSILLECTOMY    . TOTAL HIP REVISION Left 11/05/2012   Procedure: Conversion of a Bipolar to a Left Total Hip Arthroplasty;  Surgeon: [name], MD;  Location: [facility];  Service: Orthopedics;  Laterality: Left;  Conversion of a Bipolar to a Left Total Hip Arthroplasty    There were no vitals filed for this visit.      Subjective Assessment - 09/03/16 1509    Subjective Right knee is painful with bending and walking.  8/10 It started Friday.  I haven't fallen.  The weight shifting exercises make it more sore so I stopped doing those.  ( We have been doing them 2 weeks)  I feel like my knees sag and bend( when I push to stand from sitting )on down unless there is something I need to hold onto.  I had that feeling when I first started here.    Currently in Pain? Yes   Pain Score 8    Pain Location Knee   Pain Orientation Right   Pain Descriptors / Indicators --  Weak,  Hurting pain   Pain Type Chronic pain  Pain Onset More than a month ago  Flared in the last 3 days   Pain Frequency Intermittent   Aggravating Factors  weightbearing, bending with  standing,  may be increased with wall exercises sliding arms up wall with weight shifting toward wall.   Pain Relieving Factors sitting   Multiple Pain Sites No                      G-Codes - 09-19-16 0730    Functional Assessment Tool Used clinical judement/ BERG   Functional Limitation Mobility: Walking and moving around   Mobility: Walking and Moving Around Current Status [id number]) At least 40 percent but less than 60 percent impaired, limited or restricted   Mobility: Walking and Moving Around Goal Status [phone]) At least 40 percent but less than 60 percent impaired, limited or restricted               [facility] Adult PT Treatment/Exercise - 09/03/16 0001      Lumbar Exercises: Stretches   Double Knee to Chest Stretch 5 reps   Double Knee to Chest Stretch Limitations legs on ball for mild stretch low back and knees     Lumbar Exercises: Aerobic   Stationary Bike L5 x 10 min  UE/LE     Lumbar Exercises: Supine   Other Supine Lumbar Exercises Decompression 10 x 5 seconds,  head press 10 ax 5 seconds.     Shoulder Exercises: Supine   Other Supine Exercises Supine scapular stabilization, red band 10 X  ER, both,  Narrow grip flexion,,  horizontal abduction  10 x red band,  cues initially     Cryotherapy   Number Minutes Cryotherapy 25 Minutes  15   Cryotherapy Location Knee  concurrent with supine back  exercises     Manual Therapy   Manual therapy comments soft tissue work distal quads right,  tender to manual, instrument assist, intermittantly                  PT Short Term Goals - 09/03/16 1807      PT SHORT TERM GOAL #1   Title pt will be </= Min A with inital HEP (08/25/2016)   Status Achieved     PT SHORT TERM GOAL #2   Title He will increase his Berg score to >/=33 to promote improvement in balance for safety (08/25/2016)   Time 4   Period Weeks   Status Achieved     PT SHORT TERM GOAL #3   Title he will improve bil abductor/ extensore hip strength to >/= 3+/5 to assist with standing and walking activites and promote safety (08/25/2016)   Time 4   Period Weeks   Status Unable to assess     PT SHORT TERM GOAL #4   Title he will be able to stand for >/= 10 min and walk for >/= 13 minutes using LRAD with </= mod postural sway to promote endurance and maintain safety   Baseline able to push cart in [employer] 30 minutes with fatigue, Prior to knee pain flare.          Time 4   Period Weeks   Status Partially Met           PT Long Term Goals - 07/25/16 1809      PT LONG TERM GOAL #1   Title pt will be </= MIN A with all HEP given as of last visit  (09/19/2016)   Time  8   Period Weeks   Status New     PT LONG TERM GOAL #2   Title pt will improve overall hip strength to >/= 4-/5 to assist community ambulation and endurace activities in standing (09/19/2016)   Time 8   Period Weeks   Status New     PT LONG TERM GOAL #3   Title He will improve his Berg Balance score to >/= 43 to demonstrate improvement in balance and safety (09/19/2016)   Time 8   Period Weeks   Status New     PT LONG TERM GOAL #4   Title pt will be able to walk/ stand for >/= 15-20 min with LRAD for safety and navigate >/= 10 steps with </= MIN A for safety to promote functional endurance and home/ community ambulatoin   Time 8   Period Weeks   Status New               Plan - 09/03/16 1803    Clinical Impression Statement Mr Jilda Panda reports his right knee started hurting Friday and has hurt ever since ,with sit to stand and walking.  Disrtal quads are tender with palpation and may be sore to exercises.  Pain decreased to 4/10 post session with pain returning when he pushed up to stand from mat.  No back pain noted today.  No new goals met.  Objective:  min assist required for sit to stand.      PT Next Visit Plan MMT hip, decompression exercise, continue stretching, stabilization, hip strengthening, Balance exercises.     PT Home Exercise Plan clamshells, heel/ toe raise, glute set, seated marching, decompression, calf stretch.   Consulted and Agree with Plan of Care Patient      Patient will benefit from skilled therapeutic intervention in order to improve the following deficits and impairments:  Abnormal gait, Decreased strength, Improper body mechanics, Postural dysfunction, Decreased activity tolerance, Decreased balance, Decreased endurance, Difficulty walking  Visit Diagnosis: Muscle weakness (generalized)  Unsteadiness on feet  Abnormal posture  Other abnormalities of gait and mobility     Problem List Patient Active Problem List   Diagnosis  Date Noted  . Neuropathy ([place]) 05/31/2016  . Gallbladder & bile duct stone with obstruction   . Tachy-brady syndrome ([place]) 05/21/2016  . Sinus pause 05/21/2016  . NSVT (nonsustained ventricular tachycardia) ([place]) 05/21/2016  . Bile duct obstruction   . Abnormal CT of the abdomen   . Paroxysmal atrial fibrillation ([place]) 05/18/2016  . Protein-calorie malnutrition, severe 05/18/2016  . Jaundice 05/17/2016  . Diabetes mellitus type 2, noninsulin dependent ([place]) 05/17/2016  . LFT elevation 05/17/2016  . Leukocytosis 05/17/2016  . 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 Constipation 07/18/2015  . Aspiration pneumonia ([place]) 04/12/2015  . Sepsis ([place]) 04/12/2015  . Hyperlipidemia 04/12/2015  . Diabetes mellitus type 2 with complications ([place]) 56/97/9480  . Lobar pneumonia due to unspecified organism 04/12/2015  . Cervical myelopathy ([place]) 11/10/2013  . Cervical disc disorder with radiculopathy of cervical region 07/01/2013  . Abnormality of gait 07/01/2013  . Unspecified arthropathy, pelvic region and thigh 11/27/2012  . Type II or unspecified type diabetes mellitus with neurological manifestations, not stated as uncontrolled(250.60) 11/27/2012  . Unspecified hereditary and idiopathic peripheral neuropathy 11/27/2012  . Acute posthemorrhagic anemia 11/27/2012  . Pain with hip hemiarthroplasty ([place]) 11/05/2012  . HYPERKALEMIA 10/26/2009  . Vitamin B12 deficiency 09/27/2009  . CARPAL TUNNEL SYNDROME 09/27/2009  . 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 CHEST PAIN  12/01/2008  . NECK PAIN, CHRONIC 07/01/2008  . METHYLMALONIC ACIDEMIA 02/09/2008  . ANXIETY STATE, UNSPECIFIED 12/29/2007  . LEG CRAMPS, NOCTURNAL 12/29/2007  . SHINGLES 12/15/2007  . HEARING LOSS 08/11/2007  . DYSFUNCTION OF EUSTACHIAN TUBE 08/04/2007  . BACK PAIN, LUMBAR 07/30/2007  . Essential hypertension 07/21/2007  . ACUTE BRONCHITIS 07/21/2007  . HYPERLIPIDEMIA 05/12/2007  . Disorder resulting from impaired renal  function 05/12/2007  . ANEMIA-NOS 12/11/2006  . HAY FEVER 12/11/2006  . PAGET'S DISEASE 12/11/2006  . TB SKIN TEST, POSITIVE 12/11/2006    HARRIS,KAREN PTA 09/03/2016, 6:10 PM  [facility] [address] [place], [place], [address] Phone: [phone]   Fax:  [phone]  Name: Andrew Norton MRN: [id number] Date of Birth: 1927-04-24

## 2016-09-06 ENCOUNTER — Encounter: Payer: Medicare Other | Admitting: Physical Therapy

## 2016-09-06 DIAGNOSIS — H353132 Nonexudative age-related macular degeneration, bilateral, intermediate dry stage: Secondary | ICD-10-CM | POA: Diagnosis not present

## 2016-09-06 DIAGNOSIS — H43811 Vitreous degeneration, right eye: Secondary | ICD-10-CM | POA: Diagnosis not present

## 2016-09-06 DIAGNOSIS — H34832 Tributary (branch) retinal vein occlusion, left eye, with macular edema: Secondary | ICD-10-CM | POA: Diagnosis not present

## 2016-09-06 DIAGNOSIS — H35033 Hypertensive retinopathy, bilateral: Secondary | ICD-10-CM | POA: Diagnosis not present

## 2016-09-10 ENCOUNTER — Ambulatory Visit: Payer: Medicare Other | Admitting: Physical Therapy

## 2016-09-10 DIAGNOSIS — R2689 Other abnormalities of gait and mobility: Secondary | ICD-10-CM

## 2016-09-10 DIAGNOSIS — M6281 Muscle weakness (generalized): Secondary | ICD-10-CM

## 2016-09-10 DIAGNOSIS — R2681 Unsteadiness on feet: Secondary | ICD-10-CM

## 2016-09-10 DIAGNOSIS — R293 Abnormal posture: Secondary | ICD-10-CM | POA: Diagnosis not present

## 2016-09-10 NOTE — Therapy (Signed)
[place], [place], [address] Phone: [phone]   Fax:  [phone]  Physical Therapy Treatment  Patient Details  Name: Andrew Norton MRN: [id number] Date of Birth: 1927-04-14 Referring Provider: Laurey Morale MD  Encounter Date: 09/10/2016      PT End of Session - 09/10/16 1301    Visit Number 12   Number of Visits 18   Date for PT Re-Evaluation 09/19/16   PT Start Time 1147   PT Stop Time [id number]   PT Time Calculation (min) 48 min   Activity Tolerance Patient tolerated treatment well   Behavior During Therapy [facility] for tasks assessed/performed      Past Medical History:  Diagnosis Date  . Anemia   . Arthritis    hips  . Atrial fibrillation ([place])   . Carpal tunnel syndrome    left leg sciatica  . Diabetes mellitus   . Hearing loss    left ear greater loss  . History of shingles    past hx. left hand  . Hyperlipidemia   . Hypertension    EKG, chest x ray 11/15/10 EPIC,  clearance Dr Sharlene Motts on chart/ pt states on meds to "prevent hypertension from diabetes"  . Paget's disease    scrotum  . Paroxysmal atrial fibrillation ([place]) 05/18/2016  . Positive PPD, treated     Past Surgical History:  Procedure Laterality Date  . CERVICAL FUSION     dr Vertell Limber 2009- retained hardware  . colonoscopy  2-08   per Dr. Oletta Lamas, normal   . EP IMPLANTABLE DEVICE N/A 05/28/2016   Procedure: Pacemaker Implant;  Surgeon: Will Meredith Leeds, MD;  Location: [facility];  Service: Cardiovascular;  Laterality: N/A;  . ERCP N/A 05/22/2016   Procedure: ENDOSCOPIC RETROGRADE CHOLANGIOPANCREATOGRAPHY (ERCP);  Surgeon: Milus Banister, MD;  Location: [facility];  Service: Endoscopy;  Laterality: N/A;  . EYE SURGERY     bilateral cataract extraction with IOL  . HIP FRACTURE SURGERY  11-24-10   left hip per Dr, [name]  . LUMBAR LAMINECTOMY/DECOMPRESSION MICRODISCECTOMY  11/07/2011   Procedure: LUMBAR  LAMINECTOMY/DECOMPRESSION MICRODISCECTOMY;  Surgeon: Johnn Hai, MD;  Location: [facility];  Service: Orthopedics;  Laterality: N/A;  Decompression of the L4 - L5 Central (X-ray)   . partial removal of scrotum    . TONSILLECTOMY    . TOTAL HIP REVISION Left 11/05/2012   Procedure: Conversion of a Bipolar to a Left Total Hip Arthroplasty;  Surgeon: [name], MD;  [facility];  Service: Orthopedics;  Laterality: Left;  Conversion of a Bipolar to a Left Total Hip Arthroplasty    There were no vitals filed for this visit.      Subjective Assessment - 09/10/16 1154    Subjective Right knee is less painful  3/10 now,  Distal quads.   I was able to walk 30 minutes 2 X since last visit.  I felt better than when I first started.     Currently in Pain? Yes   Pain Score 3    Pain Location Knee   Pain Orientation Right;Left   Pain Descriptors / Indicators Sore   Pain Frequency Intermittent   Aggravating Factors  not sure   Pain Relieving Factors sitting,  walking   Multiple Pain Sites --  right deltiod,  from exercises,  2/10,   better with rest,  soreness.  [facility] PT Assessment - 09/10/16 0001      Strength   Right Hip Flexion 4/5   Right Hip Extension --  hips stiff unable to lift legs off table   Right Hip ABduction 4/5   Left Hip Flexion 4/5   Left Hip ADduction 2+/5  sidelying                     [facility] Adult PT Treatment/Exercise - 09/10/16 0001      Knee/Hip Exercises: Aerobic   Nustep L4 8 minutes     Shoulder Exercises: Supine   Other Supine Exercises Supine scapular stabilization, red band 10 X  ER, both,  Narrow grip flexion,,  horizontal abduction  10 x red band,  sash.    Cues  HEP red     Manual Therapy   Manual therapy comments soft tissue work distal quads right,  tender to manual, instrument assist, intermittantly                PT Education - 09/10/16 1204    Education provided Yes   Education Details HEP    Person(s) Educated Patient   Methods Explanation;Demonstration;Tactile cues;Verbal cues;Handout   Comprehension Verbalized understanding;Returned demonstration;Need further instruction          PT Short Term Goals - 09/10/16 1304      PT SHORT TERM GOAL #1   Title pt will be </= Min A with inital HEP (08/25/2016)   Time 4   Period Weeks   Status Achieved     PT SHORT TERM GOAL #2   Title He will increase his Berg score to >/=33 to promote improvement in balance for safety (08/25/2016)   Time 4   Period Weeks   Status Achieved     PT SHORT TERM GOAL #3   Title he will improve bil abductor/ extensore hip strength to >/= 3+/5 to assist with standing and walking activites and promote safety (08/25/2016)   Baseline Hip flexion 4/5 both,  Right hip adduction 2+/5(sidelying)  Unable tp lift legs off mat for hip extension due to hip  tightness   Time 4   Period Weeks   Status On-going     PT SHORT TERM GOAL #4   Title he will be able to stand for >/= 10 min and walk for >/= 13 minutes using LRAD with </= mod postural sway to promote endurance and maintain safety   Time 4   Period Weeks   Status On-going           PT Long Term Goals - 07/25/16 1809      PT LONG TERM GOAL #1   Title pt will be </= MIN A with all HEP given as of last visit (09/19/2016)   Time 8   Period Weeks   Status New     PT LONG TERM GOAL #2   Title pt will improve overall hip strength to >/= 4-/5 to assist community ambulation and endurace activities in standing (09/19/2016)   Time 8   Period Weeks   Status New     PT LONG TERM GOAL #3   Title He will improve his Berg Balance score to >/= 43 to demonstrate improvement in balance and safety (09/19/2016)   Time 8   Period Weeks   Status New     PT LONG TERM GOAL #4   Title pt will be able to walk/ stand for >/= 15-20 min with LRAD for safety and navigate >/=  10 steps with </= MIN A for safety to promote functional endurance and home/ community ambulatoin    Time 8   Period Weeks   Status New               Plan - 09/10/16 1302    Clinical Impression Statement Hip flexion 4/5 bilateral.  Progress toward HEP goals.  Quads less tender today.  No pain increased with end of session.   PT Next Visit Plan MMT hip, decompression exercise, continue stretching, stabilization, hip strengthening, Balance exercises.  Consider anterior hip stretch   PT Home Exercise Plan clamshells, heel/ toe raise, glute set, seated marching, decompression, calf stretch.  Supine scapular stabilization, red band   Consulted and Agree with Plan of Care Patient      Patient will benefit from skilled therapeutic intervention in order to improve the following deficits and impairments:  Abnormal gait, Decreased strength, Improper body mechanics, Postural dysfunction, Decreased activity tolerance, Decreased balance, Decreased endurance, Difficulty walking  Visit Diagnosis: Muscle weakness (generalized)  Unsteadiness on feet  Abnormal posture  Other abnormalities of gait and mobility     Problem List Patient Active Problem List   Diagnosis Date Noted  . Neuropathy ([place]) 05/31/2016  . Gallbladder & bile duct stone with obstruction   . Tachy-brady syndrome ([place]) 05/21/2016  . Sinus pause 05/21/2016  . NSVT (nonsustained ventricular tachycardia) ([place]) 05/21/2016  . Bile duct obstruction   . Abnormal CT of the abdomen   . Paroxysmal atrial fibrillation ([place]) 05/18/2016  . Protein-calorie malnutrition, severe 05/18/2016  . Jaundice 05/17/2016  . Diabetes mellitus type 2, noninsulin dependent ([place]) 05/17/2016  . LFT elevation 05/17/2016  . Leukocytosis 05/17/2016  . 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 Constipation 07/18/2015  . Aspiration pneumonia ([place]) 04/12/2015  . Sepsis ([place]) 04/12/2015  . Hyperlipidemia 04/12/2015  . Diabetes mellitus type 2 with complications ([place]) 123456  . Lobar pneumonia due to unspecified organism 04/12/2015  . Cervical  myelopathy ([place]) 11/10/2013  . Cervical disc disorder with radiculopathy of cervical region 07/01/2013  . Abnormality of gait 07/01/2013  . Unspecified arthropathy, pelvic region and thigh 11/27/2012  . Type II or unspecified type diabetes mellitus with neurological manifestations, not stated as uncontrolled(250.60) 11/27/2012  . Unspecified hereditary and idiopathic peripheral neuropathy 11/27/2012  . Acute posthemorrhagic anemia 11/27/2012  . Pain with hip hemiarthroplasty ([place]) 11/05/2012  . HYPERKALEMIA 10/26/2009  . Vitamin B12 deficiency 09/27/2009  . CARPAL TUNNEL SYNDROME 09/27/2009  . 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 CHEST PAIN 12/01/2008  . NECK PAIN, CHRONIC 07/01/2008  . METHYLMALONIC ACIDEMIA 02/09/2008  . ANXIETY STATE, UNSPECIFIED 12/29/2007  . LEG CRAMPS, NOCTURNAL 12/29/2007  . SHINGLES 12/15/2007  . HEARING LOSS 08/11/2007  . DYSFUNCTION OF EUSTACHIAN TUBE 08/04/2007  . BACK PAIN, LUMBAR 07/30/2007  . Essential hypertension 07/21/2007  . ACUTE BRONCHITIS 07/21/2007  . HYPERLIPIDEMIA 05/12/2007  . Disorder resulting from impaired renal function 05/12/2007  . ANEMIA-NOS 12/11/2006  . HAY FEVER 12/11/2006  . PAGET'S DISEASE 12/11/2006  . TB SKIN TEST, POSITIVE 12/11/2006    Andrew Norton PTA 09/10/2016, 1:07 PM  [facility] [address] [place], [place], [address] Phone: [phone]   Fax:  [phone]  Name: Andrew Norton MRN: [id number] Date of Birth: 04/07/1927

## 2016-09-10 NOTE — Patient Instructions (Signed)
Over Head Pull: Narrow Grip       On back, knees bent, feet flat, band across thighs, elbows straight but relaxed. Pull hands apart (start). Keeping elbows straight, bring arms up and over head, hands toward floor. Keep pull steady on band. Hold momentarily. Return slowly, keeping pull steady, back to start. Repeat __10_ times. Band color ____RED__   Side Pull: Double Arm   On back, knees bent, feet flat. Arms perpendicular to body, shoulder level, elbows straight but relaxed. Pull arms out to sides, elbows straight. Resistance band comes across collarbones, hands toward floor. Hold momentarily. Slowly return to starting position. Repeat _10__ times. Band color _____RED   Sash   On back, knees bent, feet flat, left hand on left hip, right hand above left. Pull right arm DIAGONALLY (hip to shoulder) across chest. Bring right arm along head toward floor. Hold momentarily. Slowly return to starting position. Repeat __10_ times. Do with left arm. Band color __RED____   Shoulder Rotation: Double Arm   On back, knees bent, feet flat, elbows tucked at sides, bent 90, hands palms up. Pull hands apart and down toward floor, keeping elbows near sides. Hold momentarily. Slowly return to starting position. Repeat __10_ times. Band color ____RED__   

## 2016-09-11 ENCOUNTER — Telehealth: Payer: Self-pay | Admitting: Family Medicine

## 2016-09-11 MED ORDER — FERROUS SULFATE 220 (44 FE) MG/5ML PO LIQD: ORAL | 5 refills | Status: DC

## 2016-09-11 NOTE — Telephone Encounter (Signed)
Please call this in with 5 rf

## 2016-09-11 NOTE — Telephone Encounter (Signed)
Rx sent 

## 2016-09-11 NOTE — Telephone Encounter (Signed)
Refill request for Ferrous liquid, quantity 480 ml and send to [facility].

## 2016-09-12 ENCOUNTER — Ambulatory Visit: Payer: Medicare Other | Admitting: Physical Therapy

## 2016-09-12 DIAGNOSIS — M6281 Muscle weakness (generalized): Secondary | ICD-10-CM | POA: Diagnosis not present

## 2016-09-12 DIAGNOSIS — R293 Abnormal posture: Secondary | ICD-10-CM | POA: Diagnosis not present

## 2016-09-12 DIAGNOSIS — R2689 Other abnormalities of gait and mobility: Secondary | ICD-10-CM | POA: Diagnosis not present

## 2016-09-12 DIAGNOSIS — R2681 Unsteadiness on feet: Secondary | ICD-10-CM

## 2016-09-12 NOTE — Therapy (Signed)
[place], [place], [address] Phone: [phone]   Fax:  [phone]  Physical Therapy Treatment  Patient Details  Name: Andrew Norton MRN: [id number] Date of Birth: Feb 16, 1927 Referring Provider: Laurey Morale MD  Encounter Date: 09/12/2016      Norton End of Session - 09/12/16 1153    Visit Number 13   Number of Visits 18   Date for Norton Re-Evaluation 09/19/16   Authorization Type Medicare: Kx mod by 15th visit, Progress note by 21st visit   Norton Start Time 1105   Norton Stop Time 1145   Norton Time Calculation (min) 40 min   Activity Tolerance Patient tolerated treatment well   Behavior During Therapy Andrew Norton for tasks assessed/performed      Past Medical History:  Diagnosis Date  . Anemia   . Arthritis    hips  . Atrial fibrillation (Andrew Norton)   . Carpal tunnel syndrome    left leg sciatica  . Diabetes mellitus   . Hearing loss    left ear greater loss  . History of shingles    past hx. left hand  . Hyperlipidemia   . Hypertension    EKG, chest x ray 11/15/10 EPIC,  clearance Dr Andrew Norton on chart/ Norton states on meds to "prevent hypertension from diabetes"  . Paget's disease    scrotum  . Paroxysmal atrial fibrillation ([place]) 05/18/2016  . Positive PPD, treated     Past Surgical History:  Procedure Laterality Date  . CERVICAL FUSION     dr Andrew Norton 2009- retained hardware  . colonoscopy  2-08   per Dr. Oletta Norton, normal   . EP IMPLANTABLE DEVICE N/A 05/28/2016   Procedure: Pacemaker Implant;  Surgeon: Andrew Meredith Leeds, MD;  Location: [facility];  Service: Cardiovascular;  Laterality: N/A;  . ERCP N/A 05/22/2016   Procedure: ENDOSCOPIC RETROGRADE CHOLANGIOPANCREATOGRAPHY (ERCP);  Surgeon: Andrew Banister, MD;  Location: [facility];  Service: Endoscopy;  Laterality: N/A;  . EYE SURGERY     bilateral cataract extraction with IOL  . HIP FRACTURE SURGERY  11-24-10   left hip per Dr, Andrew Norton  . LUMBAR  LAMINECTOMY/DECOMPRESSION MICRODISCECTOMY  11/07/2011   Procedure: LUMBAR LAMINECTOMY/DECOMPRESSION MICRODISCECTOMY;  Surgeon: Andrew Hai, MD;  [facility];  Service: Orthopedics;  Laterality: N/A;  Decompression of the L4 - L5 Central (X-ray)   . partial removal of scrotum    . TONSILLECTOMY    . TOTAL HIP REVISION Left 11/05/2012   Procedure: Conversion of a Bipolar to a Left Total Hip Arthroplasty;  Surgeon: Andrew Alf, MD;  [facility];  Service: Orthopedics;  Laterality: Left;  Conversion of a Bipolar to a Left Total Hip Arthroplasty    There were no vitals filed for this visit.      Subjective Assessment - 09/12/16 1107    Subjective " I can tell that I am getting stronger, still having some soreness in the R knee"    Currently in Pain? No/denies            Andrew Norton Norton Assessment - 09/12/16 0001      Strength   Right Hip Flexion 4/5   Right Hip Extension 3/5   Right Hip ABduction 3+/5   Right Hip ADduction 4+/5   Left Hip Flexion 4/5   Left Hip Extension 3/5   Left Hip ABduction 3-/5   Left Hip ADduction 4+/5   Right Knee Flexion 4+/5   Right  Knee Extension 4/5   Left Knee Flexion 4/5   Left Knee Extension 4+/5                     [facility] Adult Norton Treatment/Exercise - 09/12/16 0001      Lumbar Exercises: Aerobic   Stationary Bike Nu-step L5 x 8 min     Lumbar Exercises: Supine   Bridge 10 reps;1 second  with glute squeeze x 2 sets     Knee/Hip Exercises: Stretches   Active Hamstring Stretch 2 reps;30 seconds;Both   Hip Flexor Stretch Both;20 seconds;30 seconds     Knee/Hip Exercises: Standing   Other Standing Knee Exercises alternating toe taps 3 x 10, 1 set bil HHA, 1 set 1 HHA, 1 set with no HHA   Other Standing Knee Exercises lateral stepping 6 x in // with bil HHA for safety     Knee/Hip Exercises: Seated   Sit to Sand 2 sets;10 reps;without UE support  hands on the thighs, with 6 inch step in chair in //              Balance Exercises - 09/12/16 1149      Balance Exercises: Standing   Standing Eyes Opened Narrow base of support (BOS);3 reps;30 secs  with minimal postural sway             Norton Short Term Goals - 09/10/16 1304      Norton SHORT TERM GOAL #1   Title Norton Andrew be </= Min A with inital HEP (08/25/2016)   Time 4   Period Weeks   Status Achieved     Norton SHORT TERM GOAL #2   Title He Andrew increase his Berg score to >/=33 to promote improvement in balance for safety (08/25/2016)   Time 4   Period Weeks   Status Achieved     Norton SHORT TERM GOAL #3   Title he Andrew improve bil abductor/ extensore hip strength to >/= 3+/5 to assist with standing and walking activites and promote safety (08/25/2016)   Baseline Hip flexion 4/5 both,  Right hip adduction 2+/5(sidelying)  Unable tp lift legs off mat for hip extension due to hip  tightness   Time 4   Period Weeks   Status On-going     Norton SHORT TERM GOAL #4   Title he Andrew be able to stand for >/= 10 min and walk for >/= 13 minutes using LRAD with </= mod postural sway to promote endurance and maintain safety   Time 4   Period Weeks   Status On-going           Norton Long Term Goals - 07/25/16 1809      Norton LONG TERM GOAL #1   Title Norton Andrew be </= MIN A with all HEP given as of last visit (09/19/2016)   Time 8   Period Weeks   Status New     Norton LONG TERM GOAL #2   Title Norton Andrew improve overall hip strength to >/= 4-/5 to assist community ambulation and endurace activities in standing (09/19/2016)   Time 8   Period Weeks   Status New     Norton LONG TERM GOAL #3   Title He Andrew improve his Berg Balance score to >/= 43 to demonstrate improvement in balance and safety (09/19/2016)   Time 8   Period Weeks   Status New     Norton LONG TERM GOAL #4   Title Norton Andrew  be able to walk/ stand for >/= 15-20 min with LRAD for safety and navigate >/= 10 steps with </= MIN A for safety to promote functional endurance and home/ community ambulatoin   Time 8    Period Weeks   Status New               Plan - 09/12/16 1153    Clinical Impression Statement Andrew Norton is progressing well with strength. He continues to demonstrate tightness in bil quadns and hip flexors. continued focus on hip / quad strengthening which he performed well but required frequent rest breaks. He is progressing well with balance with eyes open but exhibits mild to mod postural sway.    Norton Next Visit Plan , decompression exercise, continue stretching, stabilization, hip strengthening, Balance exercises.  Consider anterior hip stretch   Consulted and Agree with Plan of Care Patient      Patient Andrew benefit from skilled therapeutic intervention in order to improve the following deficits and impairments:  Abnormal gait, Decreased strength, Improper body mechanics, Postural dysfunction, Decreased activity tolerance, Decreased balance, Decreased endurance, Difficulty walking  Visit Diagnosis: Muscle weakness (generalized)  Unsteadiness on feet  Abnormal posture  Other abnormalities of gait and mobility     Problem List Patient Active Problem List   Diagnosis Date Noted  . Neuropathy ([place]) 05/31/2016  . Gallbladder & bile duct stone with obstruction   . Tachy-brady syndrome ([place]) 05/21/2016  . Sinus pause 05/21/2016  . NSVT (nonsustained ventricular tachycardia) ([place]) 05/21/2016  . Bile duct obstruction   . Abnormal CT of the abdomen   . Paroxysmal atrial fibrillation ([place]) 05/18/2016  . Protein-calorie malnutrition, severe 05/18/2016  . Jaundice 05/17/2016  . Diabetes mellitus type 2, noninsulin dependent ([place]) 05/17/2016  . LFT elevation 05/17/2016  . Leukocytosis 05/17/2016  . 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 Constipation 07/18/2015  . Aspiration pneumonia ([place]) 04/12/2015  . Sepsis ([place]) 04/12/2015  . Hyperlipidemia 04/12/2015  . Diabetes mellitus type 2 with complications ([place]) 123456  . Lobar pneumonia due to unspecified organism  04/12/2015  . Cervical myelopathy ([place]) 11/10/2013  . Cervical disc disorder with radiculopathy of cervical region 07/01/2013  . Abnormality of gait 07/01/2013  . Unspecified arthropathy, pelvic region and thigh 11/27/2012  . Type II or unspecified type diabetes mellitus with neurological manifestations, not stated as uncontrolled(250.60) 11/27/2012  . Unspecified hereditary and idiopathic peripheral neuropathy 11/27/2012  . Acute posthemorrhagic anemia 11/27/2012  . Pain with hip hemiarthroplasty ([place]) 11/05/2012  . HYPERKALEMIA 10/26/2009  . Vitamin B12 deficiency 09/27/2009  . CARPAL TUNNEL SYNDROME 09/27/2009  . 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 CHEST PAIN 12/01/2008  . NECK PAIN, CHRONIC 07/01/2008  . METHYLMALONIC ACIDEMIA 02/09/2008  . ANXIETY STATE, UNSPECIFIED 12/29/2007  . LEG CRAMPS, NOCTURNAL 12/29/2007  . SHINGLES 12/15/2007  . HEARING LOSS 08/11/2007  . DYSFUNCTION OF EUSTACHIAN TUBE 08/04/2007  . BACK PAIN, LUMBAR 07/30/2007  . Essential hypertension 07/21/2007  . ACUTE BRONCHITIS 07/21/2007  . HYPERLIPIDEMIA 05/12/2007  . Disorder resulting from impaired renal function 05/12/2007  . ANEMIA-NOS 12/11/2006  . HAY FEVER 12/11/2006  . PAGET'S DISEASE 12/11/2006  . TB SKIN TEST, POSITIVE 12/11/2006   [place] Norton, DPT, LAT, ATC  09/12/16  11:56 AM      [facility] [address] [place], [place], [address] Phone: [phone]   Fax:  [phone]  Name: Andrew Norton MRN: [id number] Date of Birth: 01-13-1927

## 2016-09-13 ENCOUNTER — Ambulatory Visit: Payer: Medicare Other | Attending: Family Medicine | Admitting: Physical Therapy

## 2016-09-13 DIAGNOSIS — R2681 Unsteadiness on feet: Secondary | ICD-10-CM | POA: Diagnosis not present

## 2016-09-13 DIAGNOSIS — M6281 Muscle weakness (generalized): Secondary | ICD-10-CM

## 2016-09-13 DIAGNOSIS — R293 Abnormal posture: Secondary | ICD-10-CM | POA: Diagnosis not present

## 2016-09-13 DIAGNOSIS — R2689 Other abnormalities of gait and mobility: Secondary | ICD-10-CM

## 2016-09-13 NOTE — Therapy (Signed)
[place], [place], [address] Phone: [phone]   Fax:  [phone]  Physical Therapy Treatment  Patient Details  Name: Andrew Norton MRN: [id number] Date of Birth: 1927-01-09 Referring Provider: Laurey Morale MD  Encounter Date: 09/13/2016      PT End of Session - 09/13/16 1125    Visit Number 14   Number of Visits 18   Date for PT Re-Evaluation 09/19/16   Authorization Type Medicare: Kx mod by 15th visit, Progress note by 21st visit   PT Start Time 1055   PT Stop Time 1140   PT Time Calculation (min) 45 min   Activity Tolerance Patient tolerated treatment well   Behavior During Therapy [facility] for tasks assessed/performed      Past Medical History:  Diagnosis Date  . Anemia   . Arthritis    hips  . Atrial fibrillation ([place])   . Carpal tunnel syndrome    left leg sciatica  . Diabetes mellitus   . Hearing loss    left ear greater loss  . History of shingles    past hx. left hand  . Hyperlipidemia   . Hypertension    EKG, chest x ray 11/15/10 EPIC,  clearance Dr Sharlene Motts on chart/ pt states on meds to "prevent hypertension from diabetes"  . Paget's disease    scrotum  . Paroxysmal atrial fibrillation ([place]) 05/18/2016  . Positive PPD, treated     Past Surgical History:  Procedure Laterality Date  . CERVICAL FUSION     dr Vertell Limber 2009- retained hardware  . colonoscopy  2-08   per Dr. Oletta Lamas, normal   . EP IMPLANTABLE DEVICE N/A 05/28/2016   Procedure: Pacemaker Implant;  Surgeon: Will Meredith Leeds, MD;  Location: [facility];  Service: Cardiovascular;  Laterality: N/A;  . ERCP N/A 05/22/2016   Procedure: ENDOSCOPIC RETROGRADE CHOLANGIOPANCREATOGRAPHY (ERCP);  Surgeon: Milus Banister, MD;  Location: [facility];  Service: Endoscopy;  Laterality: N/A;  . EYE SURGERY     bilateral cataract extraction with IOL  . HIP FRACTURE SURGERY  11-24-10   left hip per Dr, [name]  . LUMBAR  LAMINECTOMY/DECOMPRESSION MICRODISCECTOMY  11/07/2011   Procedure: LUMBAR LAMINECTOMY/DECOMPRESSION MICRODISCECTOMY;  Surgeon: Johnn Hai, MD;  Location: [facility];  Service: Orthopedics;  Laterality: N/A;  Decompression of the L4 - L5 Central (X-ray)   . partial removal of scrotum    . TONSILLECTOMY    . TOTAL HIP REVISION Left 11/05/2012   Procedure: Conversion of a Bipolar to a Left Total Hip Arthroplasty;  Surgeon: [name], MD;  Location: [facility];  Service: Orthopedics;  Laterality: Left;  Conversion of a Bipolar to a Left Total Hip Arthroplasty    There were no vitals filed for this visit.      Subjective Assessment - 09/13/16 1101    Subjective "I am still alittle sore in the R knee but otherwise I am doing well"                          [facility] Adult PT Treatment/Exercise - 09/13/16 0001      Knee/Hip Exercises: Aerobic   Nustep L5 x 8 min  LE only     Knee/Hip Exercises: Seated   Marching Both;Strengthening;2 sets;15 reps  3#   Sit to Sand 2 sets;10 reps  with 4 inch step on table, 1 set with 2inch step  Knee/Hip Exercises: Supine   Short Arc Quad Sets Strengthening;Right;3 sets;15 reps  with ball squeeze to activate VMO    Bridges Strengthening;Both;2 sets;10 reps  with ball between knees   Other Supine Knee/Hip Exercises Clamshells 2 x 12  with red theraband     Manual Therapy   Manual Therapy Myofascial release;Soft tissue mobilization   Manual therapy comments soft tissue work distal quads right,  tender to manual, instrument assist, intermittantly   Soft tissue mobilization IASTM over patellar tendon, and over distal vastus lateralis   Myofascial Release manual trigger point release x 3 along vastus laterlis              Balance Exercises - 09/12/16 1149      Balance Exercises: Standing   Standing Eyes Opened Narrow base of support (BOS);3 reps;30 secs  with minimal postural sway           PT Education - 09/13/16 1123     Education provided Yes   Education Details group class information to conitnued with exercise after PT has ended.   Person(s) Educated Patient;Spouse   Methods Explanation;Handout   Comprehension Verbalized understanding          PT Short Term Goals - 09/10/16 1304      PT SHORT TERM GOAL #1   Title pt will be </= Min A with inital HEP (08/25/2016)   Time 4   Period Weeks   Status Achieved     PT SHORT TERM GOAL #2   Title He will increase his Berg score to >/=33 to promote improvement in balance for safety (08/25/2016)   Time 4   Period Weeks   Status Achieved     PT SHORT TERM GOAL #3   Title he will improve bil abductor/ extensore hip strength to >/= 3+/5 to assist with standing and walking activites and promote safety (08/25/2016)   Baseline Hip flexion 4/5 both,  Right hip adduction 2+/5(sidelying)  Unable tp lift legs off mat for hip extension due to hip  tightness   Time 4   Period Weeks   Status On-going     PT SHORT TERM GOAL #4   Title he will be able to stand for >/= 10 min and walk for >/= 13 minutes using LRAD with </= mod postural sway to promote endurance and maintain safety   Time 4   Period Weeks   Status On-going           PT Long Term Goals - 07/25/16 1809      PT LONG TERM GOAL #1   Title pt will be </= MIN A with all HEP given as of last visit (09/19/2016)   Time 8   Period Weeks   Status New     PT LONG TERM GOAL #2   Title pt will improve overall hip strength to >/= 4-/5 to assist community ambulation and endurace activities in standing (09/19/2016)   Time 8   Period Weeks   Status New     PT LONG TERM GOAL #3   Title He will improve his Berg Balance score to >/= 43 to demonstrate improvement in balance and safety (09/19/2016)   Time 8   Period Weeks   Status New     PT LONG TERM GOAL #4   Title pt will be able to walk/ stand for >/= 15-20 min with LRAD for safety and navigate >/= 10 steps with </= MIN A for safety to promote functional  endurance  and home/ community ambulatoin   Time 8   Period Weeks   Status New               Plan - 09/13/16 1126    Clinical Impression Statement Mr. tierrablanca repored no soreness since yester but hdoes have pain in the  R knee. perofrmed IASTM techniques over the patellar tendon and vastus lateralis which he reported relief of pain. continued hip strengthing in supine and CKC.    PT Next Visit Plan Iastm over patellar tendon and vastus lateralis decompression exercise, continue stretching, stabilization, hip strengthening, Balance exercises.  Consider anterior hip stretch   Consulted and Agree with Plan of Care Patient      Patient will benefit from skilled therapeutic intervention in order to improve the following deficits and impairments:  Abnormal gait, Decreased strength, Improper body mechanics, Postural dysfunction, Decreased activity tolerance, Decreased balance, Decreased endurance, Difficulty walking  Visit Diagnosis: Muscle weakness (generalized)  Unsteadiness on feet  Abnormal posture  Other abnormalities of gait and mobility     Problem List Patient Active Problem List   Diagnosis Date Noted  . Neuropathy ([place]) 05/31/2016  . Gallbladder & bile duct stone with obstruction   . Tachy-brady syndrome ([place]) 05/21/2016  . Sinus pause 05/21/2016  . NSVT (nonsustained ventricular tachycardia) ([place]) 05/21/2016  . Bile duct obstruction   . Abnormal CT of the abdomen   . Paroxysmal atrial fibrillation ([place]) 05/18/2016  . Protein-calorie malnutrition, severe 05/18/2016  . Jaundice 05/17/2016  . Diabetes mellitus type 2, noninsulin dependent ([place]) 05/17/2016  . LFT elevation 05/17/2016  . Leukocytosis 05/17/2016  . 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 Constipation 07/18/2015  . Aspiration pneumonia ([place]) 04/12/2015  . Sepsis ([place]) 04/12/2015  . Hyperlipidemia 04/12/2015  . Diabetes mellitus type 2 with complications ([place]) 123456  . Lobar pneumonia due to  unspecified organism 04/12/2015  . Cervical myelopathy ([place]) 11/10/2013  . Cervical disc disorder with radiculopathy of cervical region 07/01/2013  . Abnormality of gait 07/01/2013  . Unspecified arthropathy, pelvic region and thigh 11/27/2012  . Type II or unspecified type diabetes mellitus with neurological manifestations, not stated as uncontrolled(250.60) 11/27/2012  . Unspecified hereditary and idiopathic peripheral neuropathy 11/27/2012  . Acute posthemorrhagic anemia 11/27/2012  . Pain with hip hemiarthroplasty ([place]) 11/05/2012  . HYPERKALEMIA 10/26/2009  . Vitamin B12 deficiency 09/27/2009  . CARPAL TUNNEL SYNDROME 09/27/2009  . 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 CHEST PAIN 12/01/2008  . NECK PAIN, CHRONIC 07/01/2008  . METHYLMALONIC ACIDEMIA 02/09/2008  . ANXIETY STATE, UNSPECIFIED 12/29/2007  . LEG CRAMPS, NOCTURNAL 12/29/2007  . SHINGLES 12/15/2007  . HEARING LOSS 08/11/2007  . DYSFUNCTION OF EUSTACHIAN TUBE 08/04/2007  . BACK PAIN, LUMBAR 07/30/2007  . Essential hypertension 07/21/2007  . ACUTE BRONCHITIS 07/21/2007  . HYPERLIPIDEMIA 05/12/2007  . Disorder resulting from impaired renal function 05/12/2007  . ANEMIA-NOS 12/11/2006  . HAY FEVER 12/11/2006  . PAGET'S DISEASE 12/11/2006  . TB SKIN TEST, POSITIVE 12/11/2006   Starr Lake PT, DPT, LAT, ATC  09/13/16  11:43 AM      [facility] [address] [place], [place], [address] Phone: [phone]   Fax:  [phone]  Name: Andrew Norton MRN: [id number] Date of Birth: 1927/04/01

## 2016-09-14 NOTE — Telephone Encounter (Signed)
Pt would like sylvia to return his call concerning iron pill

## 2016-09-17 ENCOUNTER — Ambulatory Visit: Payer: Medicare Other | Admitting: Physical Therapy

## 2016-09-17 DIAGNOSIS — R2681 Unsteadiness on feet: Secondary | ICD-10-CM

## 2016-09-17 DIAGNOSIS — B351 Tinea unguium: Secondary | ICD-10-CM | POA: Diagnosis not present

## 2016-09-17 DIAGNOSIS — L602 Onychogryphosis: Secondary | ICD-10-CM | POA: Diagnosis not present

## 2016-09-17 DIAGNOSIS — R2689 Other abnormalities of gait and mobility: Secondary | ICD-10-CM

## 2016-09-17 DIAGNOSIS — M6281 Muscle weakness (generalized): Secondary | ICD-10-CM

## 2016-09-17 DIAGNOSIS — R293 Abnormal posture: Secondary | ICD-10-CM

## 2016-09-17 DIAGNOSIS — E1351 Other specified diabetes mellitus with diabetic peripheral angiopathy without gangrene: Secondary | ICD-10-CM | POA: Diagnosis not present

## 2016-09-17 MED ORDER — FERROUS SULFATE 220 (44 FE) MG/5ML PO LIQD: ORAL | 0 refills | Status: DC

## 2016-09-17 NOTE — Addendum Note (Signed)
Addended by: Aggie Hacker A on: 09/17/2016 10:15 AM   Modules accepted: Orders

## 2016-09-17 NOTE — Telephone Encounter (Signed)
I had to send in iron to local pharmacy while pt is waiting on mail order and I spoke with pt.

## 2016-09-17 NOTE — Therapy (Signed)
[place], [place], [address] Phone: [phone]   Fax:  [phone]  Physical Therapy Treatment  Patient Details  Name: Andrew Norton MRN: [id number] Date of Birth: [birth year] Referring Provider: Laurey Morale MD  Encounter Date: 09/17/2016      PT End of Session - 09/17/16 1743    Visit Number 15   Number of Visits 18   Date for PT Re-Evaluation 09/19/16   PT Start Time 1694   PT Stop Time 1640   PT Time Calculation (min) 52 min   Activity Tolerance Patient tolerated treatment well      Past Medical History:  Diagnosis Date  . Anemia   . Arthritis    hips  . Atrial fibrillation ([place])   . Carpal tunnel syndrome    left leg sciatica  . Diabetes mellitus   . Hearing loss    left ear greater loss  . History of shingles    past hx. left hand  . Hyperlipidemia   . Hypertension    EKG, chest x ray 11/15/10 EPIC,  clearance Dr Sharlene Motts on chart/ pt states on meds to "prevent hypertension from diabetes"  . Paget's disease    scrotum  . Paroxysmal atrial fibrillation ([place]) 05/18/2016  . Positive PPD, treated     Past Surgical History:  Procedure Laterality Date  . CERVICAL FUSION     dr Vertell Limber 2009- retained hardware  . colonoscopy  2-08   per Dr. Oletta Lamas, normal   . EP IMPLANTABLE DEVICE N/A 05/28/2016   Procedure: Pacemaker Implant;  Surgeon: Will Meredith Leeds, MD;  Location: [facility];  Service: Cardiovascular;  Laterality: N/A;  . ERCP N/A 05/22/2016   Procedure: ENDOSCOPIC RETROGRADE CHOLANGIOPANCREATOGRAPHY (ERCP);  Surgeon: Milus Banister, MD;  Location: [facility];  Service: Endoscopy;  Laterality: N/A;  . EYE SURGERY     bilateral cataract extraction with IOL  . HIP FRACTURE SURGERY  11-24-10   left hip per Dr, [name]  . LUMBAR LAMINECTOMY/DECOMPRESSION MICRODISCECTOMY  11/07/2011   Procedure: LUMBAR LAMINECTOMY/DECOMPRESSION MICRODISCECTOMY;  Surgeon: Johnn Hai, MD;   Location[facility];  Service: Orthopedics;  Laterality: N/A;  Decompression of the L4 - L5 Central (X-ray)   . partial removal of scrotum    . TONSILLECTOMY    . TOTAL HIP REVISION Left 11/05/2012   Procedure: Conversion of a Bipolar to a Left Total Hip Arthroplasty;  Surgeon: [name], MD;  Location: [facility];  Service: Orthopedics;  Laterality: Left;  Conversion of a Bipolar to a Left Total Hip Arthroplasty    There were no vitals filed for this visit.      Subjective Assessment - 09/17/16 1551    Subjective Right knee 8/10,  back 5/10.  Early sat morning I fell getting out of bed at night,  the regular walker was wobbley and I fell to the floor between the chest anf the bed.     Pain Location Knee   Pain Orientation Right   Pain Descriptors / Indicators Sore   Pain Type Acute pain  from falling recently   Pain Frequency Intermittent   Aggravating Factors  Falling   Pain Relieving Factors ice   Multiple Pain Sites --  Back pain lower left.  hit a chest of drawers and then the floor early Sat am.      5/10.  I got bruised and scratched.  [facility] PT Assessment - 09/17/16 0001      Special Tests    Special Tests --  Vania Rea checked knee special test. ligaments about equal RT/LT                     [facility] Adult PT Treatment/Exercise - 09/17/16 0001      Lumbar Exercises: Aerobic   Stationary Bike Nu-Steo 13 minutes UE/LE at patient's request.     Knee/Hip Exercises: Supine   Quad Sets Limitations Isometric quads at 60  6 X pain 5/10   Short Arc Field seismologist Sets Limitations too painful even in middle ranges   Heel Slides Limitations Isometric hamstringsa 10 x 5 seconds right   Bridges Limitations 2 reps,  stopped due to back pain,  legs on bolster for this   Other Supine Knee/Hip Exercises Bent hip/knee pink foam roller squeeze 10 x 5 seconds     Cryotherapy   Number Minutes Cryotherapy 10 Minutes  low back 30 minutes  concurrent with supine exercises   Cryotherapy Location Knee;Lumbar Spine   Type of Cryotherapy --  cold pack     Manual Therapy   Manual therapy comments distal,  lateral quads right   Soft tissue mobilization PROM right hip/knee in painfree ranges                PT Education - 09/17/16 1647    Education provided Yes   Education Details Fall prevention   Person(s) Educated Patient   Methods Explanation   Comprehension Verbalized understanding          PT Short Term Goals - 09/17/16 1748      PT SHORT TERM GOAL #1   Title pt will be </= Min A with inital HEP (08/25/2016)   Time 4   Period Weeks   Status Achieved     PT SHORT TERM GOAL #2   Title He will increase his Berg score to >/=33 to promote improvement in balance for safety (08/25/2016)   Time 4   Period Weeks   Status Achieved     PT SHORT TERM GOAL #3   Title he will improve bil abductor/ extensore hip strength to >/= 3+/5 to assist with standing and walking activites and promote safety (08/25/2016)   Time 4   Period Weeks   Status Unable to assess     PT SHORT TERM GOAL #4   Title he will be able to stand for >/= 10 min and walk for >/= 13 minutes using LRAD with </= mod postural sway to promote endurance and maintain safety   Baseline Able to walk 30 minutes with walker/ grocery cart  able to stand 10 minutes if we moves his feet a little.   Time 4   Period Weeks   Status Partially Met           PT Long Term Goals - 07/25/16 1809      PT LONG TERM GOAL #1   Title pt will be </= MIN A with all HEP given as of last visit (09/19/2016)   Time 8   Period Weeks   Status New     PT LONG TERM GOAL #2   Title pt will improve overall hip strength to >/= 4-/5 to assist community ambulation and endurace activities in standing (09/19/2016)   Time 8   Period Weeks   Status New     PT LONG TERM GOAL #3  Title He will improve his Merrilee Jansky Balance score to >/= 43 to demonstrate improvement in balance and  safety (09/19/2016)   Time 8   Period Weeks   Status New     PT LONG TERM GOAL #4   Title pt will be able to walk/ stand for >/= 15-20 min with LRAD for safety and navigate >/= 10 steps with </= MIN A for safety to promote functional endurance and home/ community ambulatoin   Time 8   Period Weeks   Status New               Plan - 09/17/16 1743    Clinical Impression Statement Pain flare from a recent fall.  Patient feels he could not have prevented it he thought his walker was wobbley,  Wife does not think walker  is the reason he fell  ,  She thinks he fell because "He has a lot going on.   "Less pain after session .  Ligaments checked By Carlus Pavlov PT.  OK to exercise  in the middlr knee ranges.  Limited back exercises today due to pain.  STG#4 partially met   PT Next Visit Plan Iastm over patellar tendon and vastus lateralis decompression exercise, continue stretching, stabilization, hip strengthening, Balance exercises.  Consider anterior hip stretch   PT Home Exercise Plan clamshells, heel/ toe raise, glute set, seated marching, decompression, calf stretch.  Supine scapular stabilization, red band   Consulted and Agree with Plan of Care Patient;Family member/caregiver   Family Member Consulted Wife      Patient will benefit from skilled therapeutic intervention in order to improve the following deficits and impairments:  Abnormal gait, Decreased strength, Improper body mechanics, Postural dysfunction, Decreased activity tolerance, Decreased balance, Decreased endurance, Difficulty walking  Visit Diagnosis: Muscle weakness (generalized)  Unsteadiness on feet  Abnormal posture  Other abnormalities of gait and mobility     Problem List Patient Active Problem List   Diagnosis Date Noted  . Neuropathy ([place]) 05/31/2016  . Gallbladder & bile duct stone with obstruction   . Tachy-brady syndrome ([place]) 05/21/2016  . Sinus pause 05/21/2016  . NSVT (nonsustained ventricular  tachycardia) ([place]) 05/21/2016  . Bile duct obstruction   . Abnormal CT of the abdomen   . Paroxysmal atrial fibrillation ([place]) 05/18/2016  . Protein-calorie malnutrition, severe 05/18/2016  . Jaundice 05/17/2016  . Diabetes mellitus type 2, noninsulin dependent ([place]) 05/17/2016  . LFT elevation 05/17/2016  . Leukocytosis 05/17/2016  . 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 Constipation 07/18/2015  . Aspiration pneumonia ([place]) 04/12/2015  . Sepsis ([place]) 04/12/2015  . Hyperlipidemia 04/12/2015  . Diabetes mellitus type 2 with complications ([place]) 53/66/4403  . Lobar pneumonia due to unspecified organism 04/12/2015  . Cervical myelopathy ([place]) 11/10/2013  . Cervical disc disorder with radiculopathy of cervical region 07/01/2013  . Abnormality of gait 07/01/2013  . Unspecified arthropathy, pelvic region and thigh 11/27/2012  . Type II or unspecified type diabetes mellitus with neurological manifestations, not stated as uncontrolled(250.60) 11/27/2012  . Unspecified hereditary and idiopathic peripheral neuropathy 11/27/2012  . Acute posthemorrhagic anemia 11/27/2012  . Pain with hip hemiarthroplasty ([place]) 11/05/2012  . HYPERKALEMIA 10/26/2009  . Vitamin B12 deficiency 09/27/2009  . CARPAL TUNNEL SYNDROME 09/27/2009  . 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 CHEST PAIN 12/01/2008  . NECK PAIN, CHRONIC 07/01/2008  . METHYLMALONIC ACIDEMIA 02/09/2008  . ANXIETY STATE, UNSPECIFIED 12/29/2007  . LEG CRAMPS, NOCTURNAL 12/29/2007  . SHINGLES 12/15/2007  . HEARING LOSS 08/11/2007  .  DYSFUNCTION OF EUSTACHIAN TUBE 08/04/2007  . BACK PAIN, LUMBAR 07/30/2007  . Essential hypertension 07/21/2007  . ACUTE BRONCHITIS 07/21/2007  . HYPERLIPIDEMIA 05/12/2007  . Disorder resulting from impaired renal function 05/12/2007  . ANEMIA-NOS 12/11/2006  . HAY FEVER 12/11/2006  . PAGET'S DISEASE 12/11/2006  . TB SKIN TEST, POSITIVE 12/11/2006    HARRIS,KAREN PTA 09/17/2016, 5:51 PM  [facility] [address] [place], [place], [address] Phone: [phone]   Fax:  [phone]  Name: EYTAN CARRIGAN MRN: [id number] Date of Birth: July 30, 1926

## 2016-09-17 NOTE — Patient Instructions (Signed)
Continue to use ICE  Continue HEP as long as it is relatively pain free

## 2016-09-20 ENCOUNTER — Ambulatory Visit: Payer: Medicare Other | Admitting: Physical Therapy

## 2016-09-20 DIAGNOSIS — R2689 Other abnormalities of gait and mobility: Secondary | ICD-10-CM

## 2016-09-20 DIAGNOSIS — M6281 Muscle weakness (generalized): Secondary | ICD-10-CM

## 2016-09-20 DIAGNOSIS — R293 Abnormal posture: Secondary | ICD-10-CM

## 2016-09-20 DIAGNOSIS — R2681 Unsteadiness on feet: Secondary | ICD-10-CM | POA: Diagnosis not present

## 2016-09-20 NOTE — Therapy (Signed)
[place], [place], [address] Phone: [phone]   Fax:  [phone]  Physical Therapy Treatment / Discharge Note  Patient Details  Name: Andrew Norton MRN: [id number] Date of Birth: 02/15/1927 Referring Provider: Laurey Morale MD  Encounter Date: 09/20/2016      PT End of Session - 09/20/16 1101    Visit Number 16   Number of Visits 18   Date for PT Re-Evaluation 09/19/16   Authorization Type Medicare: Kx mod by 15th visit, Progress note by 21st visit   PT Start Time 1016   PT Stop Time 1101   PT Time Calculation (min) 45 min   Activity Tolerance Patient tolerated treatment well   Behavior During Therapy [facility] for tasks assessed/performed      Past Medical History:  Diagnosis Date  . Anemia   . Arthritis    hips  . Atrial fibrillation ([place])   . Carpal tunnel syndrome    left leg sciatica  . Diabetes mellitus   . Hearing loss    left ear greater loss  . History of shingles    past hx. left hand  . Hyperlipidemia   . Hypertension    EKG, chest x ray 11/15/10 EPIC,  clearance Dr Sharlene Motts on chart/ pt states on meds to "prevent hypertension from diabetes"  . Paget's disease    scrotum  . Paroxysmal atrial fibrillation ([place]) 05/18/2016  . Positive PPD, treated     Past Surgical History:  Procedure Laterality Date  . CERVICAL FUSION     dr Vertell Limber 2009- retained hardware  . colonoscopy  2-08   per Dr. Oletta Lamas, normal   . EP IMPLANTABLE DEVICE N/A 05/28/2016   Procedure: Pacemaker Implant;  Surgeon: Will Meredith Leeds, MD;  Location: [facility];  Service: Cardiovascular;  Laterality: N/A;  . ERCP N/A 05/22/2016   Procedure: ENDOSCOPIC RETROGRADE CHOLANGIOPANCREATOGRAPHY (ERCP);  Surgeon: Milus Banister, MD;  Location: [facility];  Service: Endoscopy;  Laterality: N/A;  . EYE SURGERY     bilateral cataract extraction with IOL  . HIP FRACTURE SURGERY  11-24-10   left hip per Dr, [name]   . LUMBAR LAMINECTOMY/DECOMPRESSION MICRODISCECTOMY  11/07/2011   Procedure: LUMBAR LAMINECTOMY/DECOMPRESSION MICRODISCECTOMY;  Surgeon: Johnn Hai, MD;  Location: [facility];  Service: Orthopedics;  Laterality: N/A;  Decompression of the L4 - L5 Central (X-ray)   . partial removal of scrotum    . TONSILLECTOMY    . TOTAL HIP REVISION Left 11/05/2012   Procedure: Conversion of a Bipolar to a Left Total Hip Arthroplasty;  Surgeon: [name], MD;  Location: [facility];  Service: Orthopedics;  Laterality: Left;  Conversion of a Bipolar to a Left Total Hip Arthroplasty    There were no vitals filed for this visit.      Subjective Assessment - 09/20/16 1022    Subjective "my knee is feeling alittle better, still some pain inthe L hip"    Currently in Pain? Yes   Pain Score 5    Pain Location Knee   Pain Orientation Right   Pain Descriptors / Indicators Aching;Sore   Pain Type Acute pain   Aggravating Factors  walking/ standing   Pain Relieving Factors ice            [facility] PT Assessment - 09/20/16 0001      AROM   Cervical Flexion 33   Cervical Extension 40  cracking/ popping during movement  Cervical - Right Side Bend 14   Cervical - Left Side Bend 14   Cervical - Right Rotation 40   Cervical - Left Rotation 36     Strength   Right Hip Flexion 4/5   Right Hip ABduction 3+/5   Right Hip ADduction 4+/5   Left Hip Flexion 4/5   Left Hip ABduction 3+/5   Left Hip ADduction 4+/5   Right Knee Flexion 4+/5   Right Knee Extension 4+/5   Left Knee Flexion 4/5   Left Knee Extension 4+/5     Berg Balance Test   Sit to Stand Able to stand  independently using hands   Standing Unsupported Able to stand 2 minutes with supervision   Sitting with Back Unsupported but Feet Supported on Floor or Stool Able to sit safely and securely 2 minutes   Stand to Sit Controls descent by using hands   Transfers Able to transfer safely, definite need of hands   Standing Unsupported with  Eyes Closed Able to stand 10 seconds safely   Standing Ubsupported with Feet Together Able to place feet together independently and stand for 1 minute with supervision   From Standing, Reach Forward with Outstretched Arm Can reach forward >12 cm safely (5")   From Standing Position, Pick up Object from Floor Unable to pick up shoe, but reaches 2-5 cm (1-2") from shoe and balances independently   From Standing Position, Turn to Look Behind Over each Shoulder Turn sideways only but maintains balance   Turn 360 Degrees Able to turn 360 degrees safely but slowly   Standing Unsupported, Alternately Place Feet on Step/Stool Able to stand independently and complete 8 steps >20 seconds   Standing Unsupported, One Foot in Front Needs help to step but can hold 15 seconds   Standing on One Leg Tries to lift leg/unable to hold 3 seconds but remains standing independently   Total Score 37                     [facility] Adult PT Treatment/Exercise - 09/20/16 0001      Lumbar Exercises: Aerobic   Stationary Bike L6 x 10 min using LE only     Lumbar Exercises: Supine   Bridge 10 reps;2 seconds                PT Education - 09/20/16 1100    Education provided Yes   Education Details reviewed previously proved HEP and how to conitnue with progressing strength/ endurance by increasing reps/ sets. to continue to work with group class for conitnued benefit.    Person(s) Educated Patient   Methods Explanation;Verbal cues   Comprehension Verbalized understanding;Verbal cues required          PT Short Term Goals - 09/20/16 1052      PT SHORT TERM GOAL #1   Title pt will be </= Min A with inital HEP (08/25/2016)   Time 4   Period Weeks   Status Achieved     PT SHORT TERM GOAL #2   Title He will increase his Berg score to >/=33 to promote improvement in balance for safety (08/25/2016)   Time 4   Period Weeks   Status Achieved     PT SHORT TERM GOAL #3   Title he will improve bil  abductor/ extensore hip strength to >/= 3+/5 to assist with standing and walking activites and promote safety (08/25/2016)   Time 4   Period Weeks   Status  Partially Met     PT SHORT TERM GOAL #4   Title he will be able to stand for >/= 10 min and walk for >/= 13 minutes using LRAD with </= mod postural sway to promote endurance and maintain safety   Time 4   Period Weeks   Status Partially Met           PT Long Term Goals - October 16, 2016 1053      PT LONG TERM GOAL #1   Title pt will be </= MIN A with all HEP given as of last visit (09/19/2016)   Time 8   Period Weeks   Status Achieved     PT LONG TERM GOAL #2   Title pt will improve overall hip strength to >/= 4-/5 to assist community ambulation and endurace activities in standing (09/19/2016)   Time 8   Period Weeks   Status Partially Met     PT LONG TERM GOAL #3   Title He will improve his Berg Balance score to >/= 43 to demonstrate improvement in balance and safety (09/19/2016)   Baseline 37/ 56   Time 8   Period Weeks   Status Partially Met     PT LONG TERM GOAL #4   Title pt will be able to walk/ stand for >/= 15-20 min with LRAD for safety and navigate >/= 10 steps with </= MIN A for safety to promote functional endurance and home/ community ambulatoin   Time 8   Period Weeks   Status Not Met               Plan - 16-Oct-2016 1102    Clinical Impression Statement Mr. Gerling made good progress with therapy improving his BERG score to 37/56 and exhibits improvement in cervical mobility and bil LE strength. He continues to exhibit weakness and instability related to fatigue due to pt fatiguing quickly. He met or partially met all goals except for LTG #4. He plans to attend the group class and will be discharged from PT today    PT Treatment/Interventions ADLs/Self Care Home Management;Cryotherapy;Moist Heat;Gait training;Therapeutic activities;Therapeutic exercise;Taping;Manual techniques;Patient/family education;Stair  training;Functional mobility training;Passive range of motion;Neuromuscular re-education;Balance training   PT Next Visit Plan D/C   Consulted and Agree with Plan of Care Patient      Patient will benefit from skilled therapeutic intervention in order to improve the following deficits and impairments:  Abnormal gait, Decreased strength, Improper body mechanics, Postural dysfunction, Decreased activity tolerance, Decreased balance, Decreased endurance, Difficulty walking  Visit Diagnosis: Muscle weakness (generalized)  Unsteadiness on feet  Abnormal posture  Other abnormalities of gait and mobility       G-Codes - 10-16-2016 1111    Functional Assessment Tool Used (Outpatient Only) clinical judement/ BERG   Functional Limitation Mobility: Walking and moving around   Mobility: Walking and Moving Around Goal Status [phone]) At least 40 percent but less than 60 percent impaired, limited or restricted   Mobility: Walking and Moving Around Discharge Status [phone]) At least 40 percent but less than 60 percent impaired, limited or restricted      Problem List Patient Active Problem List   Diagnosis Date Noted  . Neuropathy ([place]) 05/31/2016  . Gallbladder & bile duct stone with obstruction   . Tachy-brady syndrome ([place]) 05/21/2016  . Sinus pause 05/21/2016  . NSVT (nonsustained ventricular tachycardia) ([place]) 05/21/2016  . Bile duct obstruction   . Abnormal CT of the abdomen   . Paroxysmal atrial fibrillation ([place]) 05/18/2016  .  Protein-calorie malnutrition, severe 05/18/2016  . Jaundice 05/17/2016  . Diabetes mellitus type 2, noninsulin dependent ([place]) 05/17/2016  . LFT elevation 05/17/2016  . Leukocytosis 05/17/2016  . 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 Constipation 07/18/2015  . Aspiration pneumonia ([place]) 04/12/2015  . Sepsis ([place]) 04/12/2015  . Hyperlipidemia 04/12/2015  . Diabetes mellitus type 2 with complications ([place]) 22/56/7209  . Lobar pneumonia due to unspecified  organism 04/12/2015  . Cervical myelopathy ([place]) 11/10/2013  . Cervical disc disorder with radiculopathy of cervical region 07/01/2013  . Abnormality of gait 07/01/2013  . Unspecified arthropathy, pelvic region and thigh 11/27/2012  . Type II or unspecified type diabetes mellitus with neurological manifestations, not stated as uncontrolled(250.60) 11/27/2012  . Unspecified hereditary and idiopathic peripheral neuropathy 11/27/2012  . Acute posthemorrhagic anemia 11/27/2012  . Pain with hip hemiarthroplasty ([place]) 11/05/2012  . HYPERKALEMIA 10/26/2009  . Vitamin B12 deficiency 09/27/2009  . CARPAL TUNNEL SYNDROME 09/27/2009  . 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 CHEST PAIN 12/01/2008  . NECK PAIN, CHRONIC 07/01/2008  . METHYLMALONIC ACIDEMIA 02/09/2008  . ANXIETY STATE, UNSPECIFIED 12/29/2007  . LEG CRAMPS, NOCTURNAL 12/29/2007  . SHINGLES 12/15/2007  . HEARING LOSS 08/11/2007  . DYSFUNCTION OF EUSTACHIAN TUBE 08/04/2007  . BACK PAIN, LUMBAR 07/30/2007  . Essential hypertension 07/21/2007  . ACUTE BRONCHITIS 07/21/2007  . HYPERLIPIDEMIA 05/12/2007  . Disorder resulting from impaired renal function 05/12/2007  . ANEMIA-NOS 12/11/2006  . HAY FEVER 12/11/2006  . PAGET'S DISEASE 12/11/2006  . TB SKIN TEST, POSITIVE 12/11/2006   Starr Lake PT, DPT, LAT, ATC  09/20/16  11:12 AM      [facility] [address] [place], [place], [address] Phone: [phone]   Fax:  [phone]  Name: Andrew Norton MRN: [id number] Date of Birth: [birth year]     PHYSICAL THERAPY DISCHARGE SUMMARY  Visits from Start of Care: 17  Current functional level related to goals / functional outcomes: See goals   Remaining deficits: Limited endurance with prolonged walking/ standing activities. Weakness in bil LE with minimal R knee/ L hip pain due to a fall previously. Moderate fall risk with Berg score at 37/56.   Education /  Equipment: HEP, posture, balance, group class.   Plan: Patient agrees to discharge.  Patient goals were partially met. Patient is being discharged due to being pleased with the current functional level.  ?????     Bjorn Hallas PT, DPT, LAT, ATC  09/20/16  11:12 AM

## 2016-09-27 ENCOUNTER — Ambulatory Visit (INDEPENDENT_AMBULATORY_CARE_PROVIDER_SITE_OTHER): Payer: Medicare Other

## 2016-09-27 ENCOUNTER — Ambulatory Visit: Payer: Medicare Other

## 2016-09-27 DIAGNOSIS — E538 Deficiency of other specified B group vitamins: Secondary | ICD-10-CM | POA: Diagnosis not present

## 2016-09-27 MED ORDER — CYANOCOBALAMIN 1000 MCG/ML IJ SOLN
1000.0000 ug | Freq: Once | INTRAMUSCULAR | Status: AC
  Administered 2016-09-27: 1000 ug via INTRAMUSCULAR

## 2016-09-29 ENCOUNTER — Other Ambulatory Visit: Payer: Self-pay | Admitting: Family Medicine

## 2016-10-04 DIAGNOSIS — H34832 Tributary (branch) retinal vein occlusion, left eye, with macular edema: Secondary | ICD-10-CM | POA: Diagnosis not present

## 2016-10-04 DIAGNOSIS — H35033 Hypertensive retinopathy, bilateral: Secondary | ICD-10-CM | POA: Diagnosis not present

## 2016-10-04 DIAGNOSIS — H43811 Vitreous degeneration, right eye: Secondary | ICD-10-CM | POA: Diagnosis not present

## 2016-10-04 DIAGNOSIS — H353132 Nonexudative age-related macular degeneration, bilateral, intermediate dry stage: Secondary | ICD-10-CM | POA: Diagnosis not present

## 2016-10-08 ENCOUNTER — Encounter: Payer: Self-pay | Admitting: Family Medicine

## 2016-10-08 ENCOUNTER — Ambulatory Visit (INDEPENDENT_AMBULATORY_CARE_PROVIDER_SITE_OTHER): Payer: Medicare Other | Admitting: Family Medicine

## 2016-10-08 VITALS — BP 146/77 | HR 79 | Temp 98.2°F | Ht 70.0 in | Wt 159.0 lb

## 2016-10-08 DIAGNOSIS — D509 Iron deficiency anemia, unspecified: Secondary | ICD-10-CM | POA: Diagnosis not present

## 2016-10-08 DIAGNOSIS — E119 Type 2 diabetes mellitus without complications: Secondary | ICD-10-CM

## 2016-10-08 DIAGNOSIS — M5440 Lumbago with sciatica, unspecified side: Secondary | ICD-10-CM

## 2016-10-08 DIAGNOSIS — M5010 Cervical disc disorder with radiculopathy, unspecified cervical region: Secondary | ICD-10-CM | POA: Diagnosis not present

## 2016-10-08 MED ORDER — GLIPIZIDE 10 MG PO TABS: 5.0000 mg | ORAL_TABLET | Freq: Two times a day (BID) | ORAL | 0 refills | Status: AC

## 2016-10-08 MED ORDER — FERROUS SULFATE 220 (44 FE) MG/5ML PO LIQD: 7.0000 mL | Freq: Every day | ORAL | 11 refills | Status: DC

## 2016-10-08 MED ORDER — HYDROCODONE-ACETAMINOPHEN 5-325 MG PO TABS: 1.0000 | ORAL_TABLET | Freq: Four times a day (QID) | ORAL | 0 refills | Status: DC | PRN

## 2016-10-08 NOTE — Patient Instructions (Signed)
WE NOW OFFER   [facility] FAST TRACK!!!  SAME DAY Appointments for ACUTE CARE  Such as: Sprains, Injuries, cuts, abrasions, rashes, muscle pain, joint pain, back pain Colds, flu, sore throats, headache, allergies, cough, fever  Ear pain, sinus and eye infections Abdominal pain, nausea, vomiting, diarrhea, upset stomach Animal/insect bites  3 Easy Ways to Schedule: Walk-In Scheduling Call in scheduling Mychart Sign-up: [email or web address]         

## 2016-10-08 NOTE — Progress Notes (Signed)
   Subjective:    Patient ID: Andrew Norton, male    DOB: 10/11/1926, 83 y.o.   MRN: [id number]  HPI Here to follow up wit his wife. He is doing well. He has set up a personal trainer to give him PT twice a week and he enjoys this. His BP is stable. His am fasting glucoses run 70 to90 most of the time.    Review of Systems  Constitutional: Negative.   Respiratory: Negative.   Cardiovascular: Negative.   Neurological: Negative.        Objective:   Physical Exam  Constitutional: He is oriented to person, place, and time. He appears well-developed and well-nourished.  Using a walker   Cardiovascular: Normal rate, regular rhythm, normal heart sounds and intact distal pulses.   Pulmonary/Chest: Effort normal and breath sounds normal.  Neurological: He is alert and oriented to person, place, and time.          Assessment & Plan:  His HTN and diabetes are well controlled. His back pain is stable and he will continue the PT.  Alysia Penna, MD

## 2016-10-08 NOTE — Progress Notes (Signed)
Pre visit review using our clinic review tool, if applicable. No additional management support is needed unless otherwise documented below in the visit note. 

## 2016-10-23 DIAGNOSIS — C44329 Squamous cell carcinoma of skin of other parts of face: Secondary | ICD-10-CM | POA: Diagnosis not present

## 2016-10-23 DIAGNOSIS — X32XXXD Exposure to sunlight, subsequent encounter: Secondary | ICD-10-CM | POA: Diagnosis not present

## 2016-10-23 DIAGNOSIS — L858 Other specified epidermal thickening: Secondary | ICD-10-CM | POA: Diagnosis not present

## 2016-10-23 DIAGNOSIS — Z85828 Personal history of other malignant neoplasm of skin: Secondary | ICD-10-CM | POA: Diagnosis not present

## 2016-10-23 DIAGNOSIS — L570 Actinic keratosis: Secondary | ICD-10-CM | POA: Diagnosis not present

## 2016-10-23 DIAGNOSIS — Z08 Encounter for follow-up examination after completed treatment for malignant neoplasm: Secondary | ICD-10-CM | POA: Diagnosis not present

## 2016-10-26 ENCOUNTER — Ambulatory Visit (INDEPENDENT_AMBULATORY_CARE_PROVIDER_SITE_OTHER): Payer: Medicare Other

## 2016-10-26 DIAGNOSIS — E538 Deficiency of other specified B group vitamins: Secondary | ICD-10-CM

## 2016-10-26 MED ORDER — CYANOCOBALAMIN 1000 MCG/ML IJ SOLN
1000.0000 ug | Freq: Once | INTRAMUSCULAR | Status: AC
  Administered 2016-10-26: 1000 ug via INTRAMUSCULAR

## 2016-10-26 NOTE — Progress Notes (Signed)
Patient here for b12 injection; injection given to rt deltoid IM; patient tolerated well; no s/s of reactions; patient to call back to schedule appointment for next nursing visit.

## 2016-10-29 ENCOUNTER — Telehealth: Payer: Self-pay | Admitting: *Deleted

## 2016-10-29 NOTE — Telephone Encounter (Signed)
Spoke with patient and wife to update [employer] monitor serial number.  Serial number successfully updated in [employer].  Patient and wife aware to send transmission on 11/29/16.  They are appreciative of assistance and deny additional questions or concerns at this time.

## 2016-11-08 DIAGNOSIS — H34832 Tributary (branch) retinal vein occlusion, left eye, with macular edema: Secondary | ICD-10-CM | POA: Diagnosis not present

## 2016-11-08 DIAGNOSIS — H43811 Vitreous degeneration, right eye: Secondary | ICD-10-CM | POA: Diagnosis not present

## 2016-11-08 DIAGNOSIS — H353132 Nonexudative age-related macular degeneration, bilateral, intermediate dry stage: Secondary | ICD-10-CM | POA: Diagnosis not present

## 2016-11-08 DIAGNOSIS — H35033 Hypertensive retinopathy, bilateral: Secondary | ICD-10-CM | POA: Diagnosis not present

## 2016-11-23 ENCOUNTER — Telehealth: Payer: Self-pay | Admitting: Family Medicine

## 2016-11-23 ENCOUNTER — Ambulatory Visit: Payer: Medicare Other

## 2016-11-23 MED ORDER — HYDROCODONE-ACETAMINOPHEN 5-325 MG PO TABS: 1.0000 | ORAL_TABLET | Freq: Four times a day (QID) | ORAL | 0 refills | Status: DC | PRN

## 2016-11-23 NOTE — Telephone Encounter (Signed)
Called to see if pt wanted to schedule awv - left message.  ( will be here 5/17 - awv at 9? )

## 2016-11-23 NOTE — Telephone Encounter (Signed)
done

## 2016-11-23 NOTE — Telephone Encounter (Signed)
Refill request for Hydrocodone.  

## 2016-11-24 ENCOUNTER — Other Ambulatory Visit: Payer: Self-pay | Admitting: Family Medicine

## 2016-11-26 ENCOUNTER — Telehealth: Payer: Self-pay | Admitting: Family Medicine

## 2016-11-26 MED ORDER — SITAGLIPTIN PHOSPHATE 100 MG PO TABS: 100.0000 mg | ORAL_TABLET | Freq: Every day | ORAL | 0 refills | Status: DC

## 2016-11-26 NOTE — Telephone Encounter (Signed)
Pt is calling about his sitaGLIPtin (JANUVIA) 100 MG tablet.  Asking if a 10 day supply can be sent to [facility] at [address] in [place],  and then if the regulart Rx can be sent to [facility].  He just wants to have enough to get him by until his mail order Rx comes in.

## 2016-11-26 NOTE — Telephone Encounter (Signed)
Refill request for Januvia and a 30 day supply to [facility]. Pt is on the schedule to see Dr. Sarajane Jews this week for check up with labs. I sent script e-scribe and spoke with pt.

## 2016-11-29 ENCOUNTER — Encounter: Payer: Self-pay | Admitting: Family Medicine

## 2016-11-29 ENCOUNTER — Ambulatory Visit (INDEPENDENT_AMBULATORY_CARE_PROVIDER_SITE_OTHER): Payer: Medicare Other | Admitting: *Deleted

## 2016-11-29 ENCOUNTER — Ambulatory Visit (INDEPENDENT_AMBULATORY_CARE_PROVIDER_SITE_OTHER): Payer: Medicare Other | Admitting: Family Medicine

## 2016-11-29 VITALS — BP 144/60 | HR 78 | Temp 98.1°F | Ht 70.0 in | Wt 159.0 lb

## 2016-11-29 DIAGNOSIS — M542 Cervicalgia: Secondary | ICD-10-CM | POA: Diagnosis not present

## 2016-11-29 DIAGNOSIS — G629 Polyneuropathy, unspecified: Secondary | ICD-10-CM

## 2016-11-29 DIAGNOSIS — G959 Disease of spinal cord, unspecified: Secondary | ICD-10-CM

## 2016-11-29 DIAGNOSIS — Z23 Encounter for immunization: Secondary | ICD-10-CM

## 2016-11-29 DIAGNOSIS — I480 Paroxysmal atrial fibrillation: Secondary | ICD-10-CM

## 2016-11-29 DIAGNOSIS — I10 Essential (primary) hypertension: Secondary | ICD-10-CM | POA: Diagnosis not present

## 2016-11-29 DIAGNOSIS — E118 Type 2 diabetes mellitus with unspecified complications: Secondary | ICD-10-CM

## 2016-11-29 DIAGNOSIS — I495 Sick sinus syndrome: Secondary | ICD-10-CM

## 2016-11-29 DIAGNOSIS — E1169 Type 2 diabetes mellitus with other specified complication: Secondary | ICD-10-CM

## 2016-11-29 DIAGNOSIS — Z0000 Encounter for general adult medical examination without abnormal findings: Secondary | ICD-10-CM | POA: Diagnosis not present

## 2016-11-29 DIAGNOSIS — N401 Enlarged prostate with lower urinary tract symptoms: Secondary | ICD-10-CM | POA: Diagnosis not present

## 2016-11-29 DIAGNOSIS — D509 Iron deficiency anemia, unspecified: Secondary | ICD-10-CM

## 2016-11-29 DIAGNOSIS — E538 Deficiency of other specified B group vitamins: Secondary | ICD-10-CM | POA: Diagnosis not present

## 2016-11-29 DIAGNOSIS — E785 Hyperlipidemia, unspecified: Secondary | ICD-10-CM

## 2016-11-29 DIAGNOSIS — N138 Other obstructive and reflux uropathy: Secondary | ICD-10-CM

## 2016-11-29 LAB — CUP PACEART REMOTE DEVICE CHECK
Battery Remaining Longevity: 114 mo
Battery Voltage: 3.03 V
Brady Statistic AP VP Percent: 0.03 %
Brady Statistic AP VS Percent: 58.52 %
Brady Statistic AS VS Percent: 41.42 %
Implantable Lead Implant Date: 20171113
Implantable Lead Implant Date: 20171113
Implantable Lead Model: 5076
Implantable Lead Model: 5076
Implantable Pulse Generator Implant Date: 20171113
Lead Channel Impedance Value: 342 Ohm
Lead Channel Impedance Value: 399 Ohm
Lead Channel Impedance Value: 456 Ohm
Lead Channel Pacing Threshold Amplitude: 1.75 V
Lead Channel Pacing Threshold Amplitude: 2.5 V
Lead Channel Pacing Threshold Pulse Width: 0.4 ms
Lead Channel Sensing Intrinsic Amplitude: 1.375 mV
Lead Channel Sensing Intrinsic Amplitude: 1.375 mV
Lead Channel Sensing Intrinsic Amplitude: 6.75 mV
MDC IDC LEAD LOCATION: 753859
MDC IDC LEAD LOCATION: 753860
MDC IDC MSMT LEADCHNL RA PACING THRESHOLD PULSEWIDTH: 0.4 ms
MDC IDC MSMT LEADCHNL RV IMPEDANCE VALUE: 456 Ohm
MDC IDC MSMT LEADCHNL RV SENSING INTR AMPL: 6.75 mV
MDC IDC SESS DTM: 20180517113916
MDC IDC SET LEADCHNL RA PACING AMPLITUDE: 2.5 V
MDC IDC SET LEADCHNL RV PACING AMPLITUDE: 4 V
MDC IDC SET LEADCHNL RV PACING PULSEWIDTH: 0.8 ms
MDC IDC SET LEADCHNL RV SENSING SENSITIVITY: 1.2 mV
MDC IDC STAT BRADY AS VP PERCENT: 0.03 %
MDC IDC STAT BRADY RA PERCENT PACED: 58.41 %
MDC IDC STAT BRADY RV PERCENT PACED: 0.05 %

## 2016-11-29 LAB — HEPATIC FUNCTION PANEL
ALBUMIN: 4 g/dL (ref 3.5–5.2)
ALK PHOS: 107 U/L (ref 39–117)
ALT: 13 U/L (ref 0–53)
AST: 17 U/L (ref 0–37)
Bilirubin, Direct: 0.1 mg/dL (ref 0.0–0.3)
TOTAL PROTEIN: 7 g/dL (ref 6.0–8.3)
Total Bilirubin: 0.5 mg/dL (ref 0.2–1.2)

## 2016-11-29 LAB — POC URINALSYSI DIPSTICK (AUTOMATED)
Bilirubin, UA: NEGATIVE
GLUCOSE UA: NEGATIVE
KETONES UA: NEGATIVE
Leukocytes, UA: NEGATIVE
Nitrite, UA: NEGATIVE
PH UA: 6 (ref 5.0–8.0)
Protein, UA: NEGATIVE
RBC UA: NEGATIVE
SPEC GRAV UA: 1.01 (ref 1.010–1.025)
Urobilinogen, UA: 0.2 E.U./dL

## 2016-11-29 LAB — CBC WITH DIFFERENTIAL/PLATELET
BASOS ABS: 0.1 10*3/uL (ref 0.0–0.1)
BASOS PCT: 0.9 % (ref 0.0–3.0)
EOS ABS: 0.3 10*3/uL (ref 0.0–0.7)
Eosinophils Relative: 3.9 % (ref 0.0–5.0)
HCT: 30.6 % — ABNORMAL LOW (ref 39.0–52.0)
Hemoglobin: 10.5 g/dL — ABNORMAL LOW (ref 13.0–17.0)
LYMPHS ABS: 1.4 10*3/uL (ref 0.7–4.0)
Lymphocytes Relative: 19.5 % (ref 12.0–46.0)
MCHC: 34.5 g/dL (ref 30.0–36.0)
MCV: 90.9 fl (ref 78.0–100.0)
Monocytes Absolute: 0.6 10*3/uL (ref 0.1–1.0)
Monocytes Relative: 8.9 % (ref 3.0–12.0)
NEUTROS ABS: 4.8 10*3/uL (ref 1.4–7.7)
NEUTROS PCT: 66.8 % (ref 43.0–77.0)
PLATELETS: 249 10*3/uL (ref 150.0–400.0)
RBC: 3.37 Mil/uL — ABNORMAL LOW (ref 4.22–5.81)
RDW: 14.3 % (ref 11.5–15.5)
WBC: 7.2 10*3/uL (ref 4.0–10.5)

## 2016-11-29 LAB — BASIC METABOLIC PANEL
BUN: 16 mg/dL (ref 6–23)
CHLORIDE: 101 meq/L (ref 96–112)
CO2: 29 meq/L (ref 19–32)
CREATININE: 0.79 mg/dL (ref 0.40–1.50)
Calcium: 9 mg/dL (ref 8.4–10.5)
GFR: 98.34 mL/min (ref >60.00)
GLUCOSE: 106 mg/dL — AB (ref 70–99)
Potassium: 5.1 mEq/L (ref 3.5–5.1)
Sodium: 135 mEq/L (ref 135–145)

## 2016-11-29 LAB — LIPID PANEL
CHOL/HDL RATIO: 3
CHOLESTEROL: 128 mg/dL (ref 0–200)
HDL: 47 mg/dL (ref >39.00)
LDL Cholesterol: 56 mg/dL (ref 0–99)
NonHDL: 81.08
TRIGLYCERIDES: 126 mg/dL (ref 0.0–149.0)
VLDL: 25.2 mg/dL (ref 0.0–40.0)

## 2016-11-29 LAB — PSA: PSA: 0.45 ng/mL (ref 0.10–4.00)

## 2016-11-29 LAB — TSH: TSH: 3.06 u[IU]/mL (ref 0.35–4.50)

## 2016-11-29 LAB — HEMOGLOBIN A1C: HEMOGLOBIN A1C: 6.9 % — AB (ref 4.6–6.5)

## 2016-11-29 LAB — VITAMIN B12: Vitamin B-12: 326 pg/mL (ref 211–911)

## 2016-11-29 MED ORDER — SITAGLIPTIN PHOSPHATE 100 MG PO TABS: 100.0000 mg | ORAL_TABLET | Freq: Every day | ORAL | 3 refills | Status: DC

## 2016-11-29 NOTE — Patient Instructions (Addendum)
WE NOW OFFER   [facility] FAST TRACK!!!  SAME DAY Appointments for ACUTE CARE  Such as: Sprains, Injuries, cuts, abrasions, rashes, muscle pain, joint pain, back pain Colds, flu, sore throats, headache, allergies, cough, fever  Ear pain, sinus and eye infections Abdominal pain, nausea, vomiting, diarrhea, upset stomach Animal/insect bites  3 Easy Ways to Schedule: Walk-In Scheduling Call in scheduling Mychart Sign-up: [email or web address]     [name] , Thank you for taking time to come for your Medicare Wellness Visit. I appreciate your ongoing commitment to your health goals. Please review the following plan we discussed and let me know if I can assist you in the future.    Shingrix is a vaccine for the prevention of Shingles in Adults 50 and older.  If you are on Medicare, you can request a prescription from your doctor to be filled at a pharmacy.  Please check with your benefits regarding applicable copays or out of pocket expenses.  The Shingrix is given in 2 vaccines approx 8 weeks apart. You must receive the 2nd dose prior to 6 months from receipt of the first.   Will discuss  B12 today with Dr. Sarajane Jews  Will take your last pneumonia vaccination which is the Puenonvax or PSV 23   You can schedule a hearing screen anytime and medicare does cover.   [facility] - can assist with hearing aid x 1  No reviews  [employer] Office  [phone]  [phone]  Prevention of falls: Remove rugs or any tripping hazards in the home Use Non slip mats in bathtubs and showers Placing grab bars next to the toilet and or shower Placing handrails on both sides of the stair way Adding extra lighting in the home.   Personal safety issues reviewed:  1. Consider starting a community watch program per [facility] 2.  Changes batteries is smoke detector and/or carbon monoxide detector  3.  If you have firearms;  keep them in a safe place 4.  Wear protection when in the sun; Always wear sunscreen or a hat; It is good to have your doctor check your skin annually or review any new areas of concern 5. Driving safety; Keep in the right lane; stay 3 car lengths behind the car in front of you on the highway; look 3 times prior to pulling out; carry your cell phone everywhere you go!    Learn about the Yellow Dot program:  The program allows first responders at your emergency to have access to who your physician is, as well as your medications and medical conditions.  Citizens requesting the Yellow Dot Packages should contact Master Corporal Nunzio Cobbs at the [facility] [phone] for the first week of the program and beginning the week after Easter citizens should contact their Scientist, physiological.       These are the goals we discussed: Goals    None      This is a list of the screening recommended for you and due dates:  Health Maintenance  Topic Date Due  . Complete foot exam   09/27/1936  . Pneumonia vaccines (2 of 2 - PPSV23) 07/15/2015  . Hemoglobin A1C  09/07/2016  . Flu Shot  02/13/2017  . Eye exam for diabetics  07/06/2017  . Tetanus Vaccine  07/14/2024    Health Maintenance, Male A healthy lifestyle and preventive care is important for your health and wellness.  Ask your health care provider about what schedule of regular examinations is right for you. What should I know about weight and diet?  Eat a Healthy Diet  Eat plenty of vegetables, fruits, whole grains, low-fat dairy products, and lean protein.  Do not eat a lot of foods high in solid fats, added sugars, or salt. Maintain a Healthy Weight  Regular exercise can help you achieve or maintain a healthy weight. You should:  Do at least 150 minutes of exercise each week. The exercise should increase your heart rate and make you sweat (moderate-intensity exercise).  Do strength-training  exercises at least twice a week. Watch Your Levels of Cholesterol and Blood Lipids  Have your blood tested for lipids and cholesterol every 5 years starting at 81 years of age. If you are at high risk for heart disease, you should start having your blood tested when you are 81 years old. You may need to have your cholesterol levels checked more often if:  Your lipid or cholesterol levels are high.  You are older than 81 years of age.  You are at high risk for heart disease. What should I know about cancer screening? Many types of cancers can be detected early and may often be prevented. Lung Cancer  You should be screened every year for lung cancer if:  You are a current smoker who has smoked for at least 30 years.  You are a former smoker who has quit within the past 15 years.  Talk to your health care provider about your screening options, when you should start screening, and how often you should be screened. Colorectal Cancer  Routine colorectal cancer screening usually begins at 81 years of age and should be repeated every 5-10 years until you are 81 years old. You may need to be screened more often if early forms of precancerous polyps or small growths are found. Your health care provider may recommend screening at an earlier age if you have risk factors for colon cancer.  Your health care provider may recommend using home test kits to check for hidden blood in the stool.  A small camera at the end of a tube can be used to examine your colon (sigmoidoscopy or colonoscopy). This checks for the earliest forms of colorectal cancer. Prostate and Testicular Cancer  Depending on your age and overall health, your health care provider may do certain tests to screen for prostate and testicular cancer.  Talk to your health care provider about any symptoms or concerns you have about testicular or prostate cancer. Skin Cancer  Check your skin from head to toe regularly.  Tell your  health care provider about any new moles or changes in moles, especially if:  There is a change in a mole's size, shape, or color.  You have a mole that is larger than a pencil eraser.  Always use sunscreen. Apply sunscreen liberally and repeat throughout the day.  Protect yourself by wearing long sleeves, pants, a wide-brimmed hat, and sunglasses when outside. What should I know about heart disease, diabetes, and high blood pressure?  If you are 34-47 years of age, have your blood pressure checked every 3-5 years. If you are 33 years of age or older, have your blood pressure checked every year. You should have your blood pressure measured twice-once when you are at a hospital or clinic, and once when you are not at a hospital or clinic. Record the average of the two measurements. To check your blood pressure  when you are not at a hospital or clinic, you can use:  An automated blood pressure machine at a pharmacy.  A home blood pressure monitor.  Talk to your health care provider about your target blood pressure.  If you are between 63-54 years old, ask your health care provider if you should take aspirin to prevent heart disease.  Have regular diabetes screenings by checking your fasting blood sugar level.  If you are at a normal weight and have a low risk for diabetes, have this test once every three years after the age of 67.  If you are overweight and have a high risk for diabetes, consider being tested at a younger age or more often.  A one-time screening for abdominal aortic aneurysm (AAA) by ultrasound is recommended for men aged 54-75 years who are current or former smokers. What should I know about preventing infection? Hepatitis B  If you have a higher risk for hepatitis B, you should be screened for this virus. Talk with your health care provider to find out if you are at risk for hepatitis B infection. Hepatitis C  Blood testing is recommended for:  Everyone born from  [birth year] through 1963.  Anyone with known risk factors for hepatitis C. Sexually Transmitted Diseases (STDs)  You should be screened each year for STDs including gonorrhea and chlamydia if:  You are sexually active and are younger than 81 years of age.  You are older than 81 years of age and your health care provider tells you that you are at risk for this type of infection.  Your sexual activity has changed since you were last screened and you are at an increased risk for chlamydia or gonorrhea. Ask your health care provider if you are at risk.  Talk with your health care provider about whether you are at high risk of being infected with HIV. Your health care provider may recommend a prescription medicine to help prevent HIV infection. What else can I do?  Schedule regular health, dental, and eye exams.  Stay current with your vaccines (immunizations).  Do not use any tobacco products, such as cigarettes, chewing tobacco, and e-cigarettes. If you need help quitting, ask your health care provider.  Limit alcohol intake to no more than 2 drinks per day. One drink equals 12 ounces of beer, 5 ounces of wine, or 1 ounces of hard liquor.  Do not use street drugs.  Do not share needles.  Ask your health care provider for help if you need support or information about quitting drugs.  Tell your health care provider if you often feel depressed.  Tell your health care provider if you have ever been abused or do not feel safe at home. This information is not intended to replace advice given to you by your health care provider. Make sure you discuss any questions you have with your health care provider. Document Released: 12/29/2007 Document Revised: 02/29/2016 Document Reviewed: 04/05/2015 Elsevier Interactive Patient Education  2017 [place] Prevention in the Home Falls can cause injuries and can affect people from all age groups. There are many simple things that you can do  to make your home safe and to help prevent falls. What can I do on the outside of my home?  Regularly repair the edges of walkways and driveways and fix any cracks.  Remove high doorway thresholds.  Trim any shrubbery on the main path into your home.  Use bright outdoor lighting.  Clear  walkways of debris and clutter, including tools and rocks.  Regularly check that handrails are securely fastened and in good repair. Both sides of any steps should have handrails.  Install guardrails along the edges of any raised decks or porches.  Have leaves, snow, and ice cleared regularly.  Use sand or salt on walkways during winter months.  In the garage, clean up any spills right away, including grease or oil spills. What can I do in the bathroom?  Use night lights.  Install grab bars by the toilet and in the tub and shower. Do not use towel bars as grab bars.  Use non-skid mats or decals on the floor of the tub or shower.  If you need to sit down while you are in the shower, use a plastic, non-slip stool.  Keep the floor dry. Immediately clean up any water that spills on the floor.  Remove soap buildup in the tub or shower on a regular basis.  Attach bath mats securely with double-sided non-slip rug tape.  Remove throw rugs and other tripping hazards from the floor. What can I do in the bedroom?  Use night lights.  Make sure that a bedside light is easy to reach.  Do not use oversized bedding that drapes onto the floor.  Have a firm chair that has side arms to use for getting dressed.  Remove throw rugs and other tripping hazards from the floor. What can I do in the kitchen?  Clean up any spills right away.  Avoid walking on wet floors.  Place frequently used items in easy-to-reach places.  If you need to reach for something above you, use a sturdy step stool that has a grab bar.  Keep electrical cables out of the way.  Do not use floor polish or wax that makes  floors slippery. If you have to use wax, make sure that it is non-skid floor wax.  Remove throw rugs and other tripping hazards from the floor. What can I do in the stairways?  Do not leave any items on the stairs.  Make sure that there are handrails on both sides of the stairs. Fix handrails that are broken or loose. Make sure that handrails are as long as the stairways.  Check any carpeting to make sure that it is firmly attached to the stairs. Fix any carpet that is loose or worn.  Avoid having throw rugs at the top or bottom of stairways, or secure the rugs with carpet tape to prevent them from moving.  Make sure that you have a light switch at the top of the stairs and the bottom of the stairs. If you do not have them, have them installed. What are some other fall prevention tips?  Wear closed-toe shoes that fit well and support your feet. Wear shoes that have rubber soles or low heels.  When you use a stepladder, make sure that it is completely opened and that the sides are firmly locked. Have someone hold the ladder while you are using it. Do not climb a closed stepladder.  Add color or contrast paint or tape to grab bars and handrails in your home. Place contrasting color strips on the first and last steps.  Use mobility aids as needed, such as canes, walkers, scooters, and crutches.  Turn on lights if it is dark. Replace any light bulbs that burn out.  Set up furniture so that there are clear paths. Keep the furniture in the same spot.  Fix any uneven  floor surfaces.  Choose a carpet design that does not hide the edge of steps of a stairway.  Be aware of any and all pets.  Review your medicines with your healthcare provider. Some medicines can cause dizziness or changes in blood pressure, which increase your risk of falling. Talk with your health care provider about other ways that you can decrease your risk of falls. This may include working with a physical therapist or  trainer to improve your strength, balance, and endurance. This information is not intended to replace advice given to you by your health care provider. Make sure you discuss any questions you have with your health care provider. Document Released: 06/22/2002 Document Revised: 11/29/2015 Document Reviewed: 08/06/2014 Elsevier Interactive Patient Education  2017 [employer].

## 2016-11-29 NOTE — Progress Notes (Signed)
Remote pacemaker transmission.   

## 2016-11-29 NOTE — Progress Notes (Addendum)
Subjective:   Andrew Norton is a 83 y.o. male who presents for Medicare Annual/Subsequent preventive examination.  Cardiac Risk Factors include: advanced age (>29men, >69 women);diabetes mellitus;dyslipidemia;male gender;family history of premature cardiovascular disease;sedentary lifestyle HRA assessment completed during this visit with Andrew Norton  The Patient was informed that the wellness visit is to identify future health risk and educate and initiate measures that can reduce risk for increased disease through the lifespan.    Has 3 grown sons and family  Goes to the beach with the entire family once a year  Andrew Norton- children x 7 - ages 4 to 84yo One great grand   NO ROS; Medicare Wellness Visit Last OV: today  Labs completed: 09/2015 cho 152; HDL 43; LDL 72 and trig 184 August A1c 7.6  BS neg post hospitalization  Describes health as fair, good or great? Had pacemaker last year;  Feels good about his health now but has not driven since pacemaker;    HM update PSA deferred to Dr. Sarajane Jews Colonoscopy 08/2006 - aged out Foot exam due/ states Dr. Sarajane Jews completed today and goes to a podiatrist frequently to cut toenails and inspect feet  PSV 23 due; agreed to receive today  HgB a1c due   Update:  Tobacco: never smoked  2nd Hand Smoke Chew or electronic cigarettes no ETOH: no  Medications no issues   BMI:22. 8   Diet;   Eats well  Breakfast - cereal; fruits Egg and ham biscuit Lunch; salad or soup; sometimes a Community education officer; some kind and couple of vegetables Eats at home 50% of the time  Issues with teeth   Exercise;  set up a personal trainer x 2 days a week  Goes outpatient; enjoys the PT Pins and needles in hands secondary to surgery   Safety features reviewed for safe community;  firearms if in the home; will keep in a safe place  sun protection when outside; yes driving difficulties or accidents no; has not driven since last Nov  Advanced Directive; yes    Hearing Screening Comments: Have had hearing checked  Does have hearing issue    Vision Screening Comments: Last eye exam  Is due now  Goes to an eye specialist and gets shots in right eye Had some bleeding behind the retina; goes every few weeks; now see Retinal specialist q 4 weeks    [place];  Not driving since pacemaker; due to eyesight, movement of legs and getting in and out of the care  Difficult to maneuver the stairs at hs; but he states he is doing well  Has a Walker up stairs  and down stairs  Walk in shower  Fall hx; no  Gait: with walker  Given education on "Fall Prevention in the Home" for more safety tips the patient can apply as appropriate.  Long term goal is to "age in place"    Mental Health:  Any emotional problems? Anxious, depressed, irritable, sad or blue? no Denies feeling depressed or hopeless; voices pleasure in daily life How many social activities have you been engaged in within the last 2 weeks? no Who would help you with chores; illness; shopping other? Wife assist  Pain: some in feet but medically managed  ;   Health Maintenance Colonoscopy; aged out Foot exam completed by Dr. Sarajane Jews today Also goes to podiatrist frequently  Prostate cancer screening: deferred to MD     Immunizations Due: (Vaccines reviewed and educated regarding any overdue)  PSV due and  taken today    Patient Care Team: Laurey Morale, MD as PCP - General      Objective:    Vitals: BP (!) 144/60   Pulse 78   Temp 98.1 F (36.7 C)   Ht 5\' 10"  (1.778 m)   Wt 159 lb (72.1 kg)   BMI 22.81 kg/m   Body mass index is 22.81 kg/m.  Tobacco History  Smoking Status  . Never Smoker  Smokeless Tobacco  . Never Used     Counseling given: Yes   Past Medical History:  Diagnosis Date  . Anemia   . Arthritis    hips  . Atrial fibrillation ([place])   . Carpal tunnel syndrome    left leg sciatica  . Diabetes mellitus   . Hearing loss    left ear greater  loss  . History of shingles    past hx. left hand  . Hyperlipidemia   . Hypertension    EKG, chest x ray 11/15/10 EPIC,  clearance Dr Sharlene Motts on chart/ pt states on meds to "prevent hypertension from diabetes"  . Paget's disease    scrotum  . Paroxysmal atrial fibrillation ([place]) 05/18/2016  . Positive PPD, treated    Past Surgical History:  Procedure Laterality Date  . CERVICAL FUSION     dr Vertell Limber 2009- retained hardware  . colonoscopy  2-08   per Dr. Oletta Lamas, normal   . EP IMPLANTABLE DEVICE N/A 05/28/2016   Procedure: Pacemaker Implant;  Surgeon: Will Meredith Leeds, MD;  Location: [facility];  Service: Cardiovascular;  Laterality: N/A;  . ERCP N/A 05/22/2016   Procedure: ENDOSCOPIC RETROGRADE CHOLANGIOPANCREATOGRAPHY (ERCP);  Surgeon: Milus Banister, MD;  Location: [facility];  Service: Endoscopy;  Laterality: N/A;  . EYE SURGERY     bilateral cataract extraction with IOL  . HIP FRACTURE SURGERY  11-24-10   left hip per Dr, [name]  . LUMBAR LAMINECTOMY/DECOMPRESSION MICRODISCECTOMY  11/07/2011   Procedure: LUMBAR LAMINECTOMY/DECOMPRESSION MICRODISCECTOMY;  Surgeon: Johnn Hai, MD;  Location: [facility];  Service: Orthopedics;  Laterality: N/A;  Decompression of the L4 - L5 Central (X-ray)   . partial removal of scrotum    . TONSILLECTOMY    . TOTAL HIP REVISION Left 11/05/2012   Procedure: Conversion of a Bipolar to a Left Total Hip Arthroplasty;  Surgeon: [name], MD;  Location: [facility];  Service: Orthopedics;  Laterality: Left;  Conversion of a Bipolar to a Left Total Hip Arthroplasty   Family History  Problem Relation Age of Onset  . Tuberculosis Mother   . Heart failure Father    History  Sexual Activity  . Sexual activity: No    Outpatient Encounter Prescriptions as of 11/29/2016  Medication Sig  . cyanocobalamin (,VITAMIN B-12,) 1000 MCG/ML injection Inject 1,000 mcg into the muscle every 30 (thirty) days.  . cyclobenzaprine (FLEXERIL) 10 MG  tablet TAKE 1 TABLET 3 TIMES DAILYAS NEEDED FOR MUSCLE SPASMS  . Ferrous Sulfate 220 (44 Fe) MG/5ML LIQD Take 7 mLs by mouth daily. TAKE 7 MLS BY MOUTH DAILY  . finasteride (PROSCAR) 5 MG tablet TAKE 1 TABLET DAILY  . flecainide (TAMBOCOR) 50 MG tablet Take 1 tablet (50 mg total) by mouth every 12 (twelve) hours.  . gabapentin (NEURONTIN) 400 MG capsule TAKE 1 CAPSULE 4 TIMES     DAILY  . glipiZIDE (GLUCOTROL) 10 MG tablet Take 0.5 tablets (5 mg total) by mouth 2 (two) times daily.  Marland Kitchen HYDROcodone-acetaminophen (NORCO/VICODIN) 5-325 MG  tablet Take 1 tablet by mouth every 6 (six) hours as needed for moderate pain.  Marland Kitchen lidocaine (LIDODERM) 5 % Place 1 patch onto the skin daily. Remove & Discard patch within 12 hours or as directed by MD  . losartan (COZAAR) 50 MG tablet TAKE 1 TABLET EVERY MORNING  . metFORMIN (GLUCOPHAGE) 1000 MG tablet TAKE 1 TABLET TWICE A DAY  WITH MEALS  . NUTRITIONAL SUPPLEMENT LIQD Take 120 mLs by mouth 2 (two) times daily. SF MedPass for nutritional support  . polyethylene glycol (MIRALAX / GLYCOLAX) packet Take 17 g by mouth daily as needed for mild constipation.  . rivaroxaban (XARELTO) 20 MG TABS tablet Take 1 tablet (20 mg total) by mouth daily with supper.  . sitaGLIPtin (JANUVIA) 100 MG tablet Take 1 tablet (100 mg total) by mouth daily.  . [DISCONTINUED] sitaGLIPtin (JANUVIA) 100 MG tablet Take 1 tablet (100 mg total) by mouth daily.  . metoprolol tartrate (LOPRESSOR) 25 MG tablet Take 1 tablet (25 mg total) by mouth 2 (two) times daily.  . [DISCONTINUED] sitaGLIPtin (JANUVIA) 100 MG tablet Take 100 mg by mouth daily.   No facility-administered encounter medications on file as of 11/29/2016.     Activities of Daily Living In your present state of health, do you have any difficulty performing the following activities: 11/29/2016 05/17/2016  Hearing? [place]? Y -  Difficulty concentrating or making decisions? N -  Walking or climbing stairs? Y -  Dressing or  bathing? N -  Doing errands, shopping? N N  Preparing Food and eating ? N -  Using the Toilet? N -  In the past six months, have you accidently leaked urine? (No Data) -  Do you have problems with loss of bowel control? N -  Managing your Medications? N -  Managing your Finances? N -  Housekeeping or managing your Housekeeping? N -  Some recent data might be hidden    Patient Care Team: Laurey Morale, MD as PCP - General   Assessment:     Exercise Activities and Dietary recommendations Current Exercise Habits: Structured exercise class, Time (Minutes): > 60, Frequency (Times/Week): 2, Weekly Exercise (Minutes/Week): 0, Intensity: Moderate  To remain active  Goals    . patient          Stay active and moving       Fall Risk Fall Risk  11/29/2016 10/05/2015 07/14/2014  Falls in the past year? No No No   Depression Screen PHQ 2/9 Scores 11/29/2016 10/05/2015 07/14/2014  PHQ - 2 Score 0 0 0   MMSE - Mini Mental State Exam 11/29/2016  Orientation to time 3  Orientation to Place 5  Registration 3  Attention/ Calculation 5  Recall 2  Language- name 2 objects 2  Language- repeat 1  Language- follow 3 step command 3  Language- read & follow direction 1  Write a sentence 1  Copy design 1  Total score 27   No issues with daily life; struggles sometimes with words. Could name the 4 seasons, but had difficulty connecting spring to the present.     Immunization History  Administered Date(s) Administered  . Influenza Split 04/25/2011, 03/27/2012  . Influenza Whole 05/02/2007, 04/22/2008, 04/25/2009, 03/24/2010  . Influenza, High Dose Seasonal PF 03/25/2015, 04/03/2016  . Influenza,inj,Quad PF,36+ Mos 03/24/2013, 03/25/2014  . Pneumococcal Conjugate-13 07/14/2014  . Pneumococcal Polysaccharide-23 11/29/2016  . Tdap 07/14/2014  . Zoster 07/28/2014   Screening Tests Health Maintenance  Topic Date Due  .  FOOT EXAM  09/27/1936  . PNA vac Low Risk Adult (2 of 2 - PPSV23)  07/15/2015  . HEMOGLOBIN A1C  09/07/2016  . INFLUENZA VACCINE  02/13/2017  . OPHTHALMOLOGY EXAM  07/06/2017  . TETANUS/TDAP  07/14/2024      Plan:    PCP Notes  Health Maintenance Agreed to take PSV 23 today Educated on shingrix and will hold for now  Abnormal Screens  MMSE 27/30 but still functions; adls independent;  Missed 1 of 3 recall; date missed by one day; Was a little confused over seasons   Referrals can have another hearing screen if he likes Declines today  Patient concerns; no   Nurse Concerns; rehecked BP and was 140/60 range  Next PCP apt tbs    I have personally reviewed and noted the following in the patient's chart:   . Medical and social history . Use of alcohol, tobacco or illicit drugs  . Current medications and supplements . Functional ability and status . Nutritional status . Physical activity . Advanced directives . List of other physicians . Hospitalizations, surgeries, and ER visits in previous 12 months . Vitals . Screenings to include cognitive, depression, and falls . Referrals and appointments  In addition, I have reviewed and discussed with patient certain preventive protocols, quality metrics, and best practice recommendations. A written personalized care plan for preventive services as well as general preventive health recommendations were provided to patient.     NZVJK,QASUO, RN  11/29/2016  I have read this note and agree with its contents.  Alysia Penna, MD

## 2016-11-29 NOTE — Progress Notes (Signed)
   Subjective:    Patient ID: Andrew Norton, male    DOB: 03-29-1927, 83 y.o.   MRN: [id number]  HPI Here to follow up on various issues. He feels fine and has no concerns. His glucoses and BP have been stable at home. He sent in a telemetry report this morning on his pacemaker.    Review of Systems  Constitutional: Negative.   Respiratory: Negative.   Cardiovascular: Negative.   Gastrointestinal: Negative.   Neurological: Negative.        Objective:   Physical Exam  Constitutional: He is oriented to person, place, and time. He appears well-developed and well-nourished.  Neck: No thyromegaly present.  Cardiovascular: Normal rate, regular rhythm, normal heart sounds and intact distal pulses.   Pulmonary/Chest: Effort normal and breath sounds normal. He has no wheezes. He has no rales. He exhibits no tenderness.  Abdominal: Soft. He exhibits no distension and no mass. There is no tenderness. There is no rebound and no guarding.  Genitourinary: Rectum normal, prostate normal and penis normal. Rectal exam shows guaiac negative stool. No penile tenderness.  Lymphadenopathy:    He has no cervical adenopathy.  Neurological: He is alert and oriented to person, place, and time.          Assessment & Plan:  His HTN is stable. For the diabetes we will check an A1c today. Check a PSA. Check a B12 level. His back pain is well controlled. We await a report from Cardiology.  Alysia Penna, MD

## 2016-11-30 ENCOUNTER — Encounter: Payer: Self-pay | Admitting: Cardiology

## 2016-12-04 DIAGNOSIS — L602 Onychogryphosis: Secondary | ICD-10-CM | POA: Diagnosis not present

## 2016-12-04 DIAGNOSIS — B351 Tinea unguium: Secondary | ICD-10-CM | POA: Diagnosis not present

## 2016-12-04 DIAGNOSIS — M79672 Pain in left foot: Secondary | ICD-10-CM | POA: Diagnosis not present

## 2016-12-04 DIAGNOSIS — M79671 Pain in right foot: Secondary | ICD-10-CM | POA: Diagnosis not present

## 2016-12-04 DIAGNOSIS — E1151 Type 2 diabetes mellitus with diabetic peripheral angiopathy without gangrene: Secondary | ICD-10-CM | POA: Diagnosis not present

## 2016-12-05 ENCOUNTER — Other Ambulatory Visit: Payer: Self-pay | Admitting: Family Medicine

## 2016-12-05 NOTE — Telephone Encounter (Signed)
Request for diabetic strips for True Metrix, per pt please send to [employer] [facility] fax # [phone] and phone # [phone]. I spoke with pt and paperwork was signed and faxed, also put on medication list.

## 2016-12-06 ENCOUNTER — Ambulatory Visit (INDEPENDENT_AMBULATORY_CARE_PROVIDER_SITE_OTHER): Payer: Medicare Other

## 2016-12-06 DIAGNOSIS — H353132 Nonexudative age-related macular degeneration, bilateral, intermediate dry stage: Secondary | ICD-10-CM | POA: Diagnosis not present

## 2016-12-06 DIAGNOSIS — H35433 Paving stone degeneration of retina, bilateral: Secondary | ICD-10-CM | POA: Diagnosis not present

## 2016-12-06 DIAGNOSIS — H35033 Hypertensive retinopathy, bilateral: Secondary | ICD-10-CM | POA: Diagnosis not present

## 2016-12-06 DIAGNOSIS — E538 Deficiency of other specified B group vitamins: Secondary | ICD-10-CM | POA: Diagnosis not present

## 2016-12-06 DIAGNOSIS — H34832 Tributary (branch) retinal vein occlusion, left eye, with macular edema: Secondary | ICD-10-CM | POA: Diagnosis not present

## 2016-12-06 MED ORDER — CYANOCOBALAMIN 1000 MCG/ML IJ SOLN
1000.0000 ug | Freq: Once | INTRAMUSCULAR | Status: AC
  Administered 2016-12-06: 1000 ug via INTRAMUSCULAR

## 2016-12-13 ENCOUNTER — Telehealth: Payer: Self-pay | Admitting: Cardiology

## 2016-12-13 NOTE — Telephone Encounter (Addendum)
Pt verifies he has not had any syncopal episodes.  Informed pt that he is cleared, from post PPM implant standpoint, to drive.  Explained that normally you are cleared to drive within a few weeks of implant, but pt was not able to make it to wound check secondary to being in rehab. Pt thanks me for calling and informing him.

## 2016-12-13 NOTE — Telephone Encounter (Signed)
Patient calling to see when he can start back driving. Thanks.

## 2016-12-18 DIAGNOSIS — H524 Presbyopia: Secondary | ICD-10-CM | POA: Diagnosis not present

## 2016-12-18 DIAGNOSIS — H1851 Endothelial corneal dystrophy: Secondary | ICD-10-CM | POA: Diagnosis not present

## 2016-12-18 DIAGNOSIS — H348322 Tributary (branch) retinal vein occlusion, left eye, stable: Secondary | ICD-10-CM | POA: Diagnosis not present

## 2016-12-18 DIAGNOSIS — H401122 Primary open-angle glaucoma, left eye, moderate stage: Secondary | ICD-10-CM | POA: Diagnosis not present

## 2016-12-18 DIAGNOSIS — H52223 Regular astigmatism, bilateral: Secondary | ICD-10-CM | POA: Diagnosis not present

## 2016-12-18 DIAGNOSIS — H5213 Myopia, bilateral: Secondary | ICD-10-CM | POA: Diagnosis not present

## 2016-12-25 DIAGNOSIS — Z08 Encounter for follow-up examination after completed treatment for malignant neoplasm: Secondary | ICD-10-CM | POA: Diagnosis not present

## 2016-12-25 DIAGNOSIS — L570 Actinic keratosis: Secondary | ICD-10-CM | POA: Diagnosis not present

## 2016-12-25 DIAGNOSIS — Z85828 Personal history of other malignant neoplasm of skin: Secondary | ICD-10-CM | POA: Diagnosis not present

## 2016-12-25 DIAGNOSIS — X32XXXD Exposure to sunlight, subsequent encounter: Secondary | ICD-10-CM | POA: Diagnosis not present

## 2016-12-25 DIAGNOSIS — D0439 Carcinoma in situ of skin of other parts of face: Secondary | ICD-10-CM | POA: Diagnosis not present

## 2016-12-31 ENCOUNTER — Ambulatory Visit (INDEPENDENT_AMBULATORY_CARE_PROVIDER_SITE_OTHER): Payer: Medicare Other | Admitting: *Deleted

## 2016-12-31 DIAGNOSIS — E538 Deficiency of other specified B group vitamins: Secondary | ICD-10-CM

## 2016-12-31 MED ORDER — CYANOCOBALAMIN 1000 MCG/ML IJ SOLN
1000.0000 ug | Freq: Once | INTRAMUSCULAR | Status: AC
  Administered 2016-12-31: 1000 ug via INTRAMUSCULAR

## 2016-12-31 NOTE — Progress Notes (Signed)
Patient here for b12 injection; administered injection to left deltoid; patient tolerated well; no s/s of reactions

## 2017-01-03 DIAGNOSIS — H35033 Hypertensive retinopathy, bilateral: Secondary | ICD-10-CM | POA: Diagnosis not present

## 2017-01-03 DIAGNOSIS — H34832 Tributary (branch) retinal vein occlusion, left eye, with macular edema: Secondary | ICD-10-CM | POA: Diagnosis not present

## 2017-01-03 DIAGNOSIS — H35433 Paving stone degeneration of retina, bilateral: Secondary | ICD-10-CM | POA: Diagnosis not present

## 2017-01-03 DIAGNOSIS — H353132 Nonexudative age-related macular degeneration, bilateral, intermediate dry stage: Secondary | ICD-10-CM | POA: Diagnosis not present

## 2017-01-17 ENCOUNTER — Telehealth: Payer: Self-pay | Admitting: Family Medicine

## 2017-01-17 ENCOUNTER — Other Ambulatory Visit: Payer: Self-pay | Admitting: Family Medicine

## 2017-01-17 NOTE — Telephone Encounter (Signed)
Can we refill this? 

## 2017-01-17 NOTE — Telephone Encounter (Signed)
Patient is calling asking for a refill for polyethylene glycol (MIRALAX / GLYCOLAX) packet  to be sent to [facility].

## 2017-01-17 NOTE — Telephone Encounter (Signed)
Yes please call this in, use 17 gm daily, 90 day supply with 3 rf

## 2017-01-18 MED ORDER — POLYETHYLENE GLYCOL 3350 17 GM/SCOOP PO POWD: 17.0000 g | Freq: Every day | ORAL | 3 refills | Status: AC

## 2017-01-18 NOTE — Addendum Note (Signed)
Addended by: Aggie Hacker A on: 01/18/2017 04:35 PM   Modules accepted: Orders

## 2017-01-18 NOTE — Telephone Encounter (Signed)
I sent script e-scribe to [facility]. 

## 2017-02-05 ENCOUNTER — Telehealth: Payer: Self-pay | Admitting: Family Medicine

## 2017-02-05 ENCOUNTER — Ambulatory Visit (INDEPENDENT_AMBULATORY_CARE_PROVIDER_SITE_OTHER): Payer: Medicare Other | Admitting: Family Medicine

## 2017-02-05 DIAGNOSIS — E538 Deficiency of other specified B group vitamins: Secondary | ICD-10-CM | POA: Diagnosis not present

## 2017-02-05 MED ORDER — CYANOCOBALAMIN 1000 MCG/ML IJ SOLN
1000.0000 ug | Freq: Once | INTRAMUSCULAR | Status: AC
  Administered 2017-02-05: 1000 ug via INTRAMUSCULAR

## 2017-02-05 MED ORDER — HYDROCODONE-ACETAMINOPHEN 5-325 MG PO TABS: 1.0000 | ORAL_TABLET | Freq: Four times a day (QID) | ORAL | 0 refills | Status: DC | PRN

## 2017-02-05 NOTE — Telephone Encounter (Signed)
Done

## 2017-02-05 NOTE — Telephone Encounter (Signed)
Script is ready for pick up here at front office and I left a voice message for pt.  

## 2017-02-05 NOTE — Telephone Encounter (Signed)
Patient needs Rx refilled HYDROcodone-acetaminophen (NORCO/VICODIN) 5-325 MG tablet

## 2017-02-07 DIAGNOSIS — H35033 Hypertensive retinopathy, bilateral: Secondary | ICD-10-CM | POA: Diagnosis not present

## 2017-02-07 DIAGNOSIS — H353132 Nonexudative age-related macular degeneration, bilateral, intermediate dry stage: Secondary | ICD-10-CM | POA: Diagnosis not present

## 2017-02-07 DIAGNOSIS — H35433 Paving stone degeneration of retina, bilateral: Secondary | ICD-10-CM | POA: Diagnosis not present

## 2017-02-07 DIAGNOSIS — H34832 Tributary (branch) retinal vein occlusion, left eye, with macular edema: Secondary | ICD-10-CM | POA: Diagnosis not present

## 2017-02-18 ENCOUNTER — Encounter: Payer: Self-pay | Admitting: Cardiology

## 2017-02-19 DIAGNOSIS — E1351 Other specified diabetes mellitus with diabetic peripheral angiopathy without gangrene: Secondary | ICD-10-CM | POA: Diagnosis not present

## 2017-02-19 DIAGNOSIS — L602 Onychogryphosis: Secondary | ICD-10-CM | POA: Diagnosis not present

## 2017-02-28 ENCOUNTER — Ambulatory Visit (INDEPENDENT_AMBULATORY_CARE_PROVIDER_SITE_OTHER): Payer: Medicare Other | Admitting: *Deleted

## 2017-02-28 DIAGNOSIS — I495 Sick sinus syndrome: Secondary | ICD-10-CM

## 2017-03-01 ENCOUNTER — Telehealth: Payer: Self-pay | Admitting: Family Medicine

## 2017-03-01 ENCOUNTER — Ambulatory Visit (INDEPENDENT_AMBULATORY_CARE_PROVIDER_SITE_OTHER): Payer: Medicare Other

## 2017-03-01 DIAGNOSIS — E538 Deficiency of other specified B group vitamins: Secondary | ICD-10-CM

## 2017-03-01 MED ORDER — CYANOCOBALAMIN 1000 MCG/ML IJ SOLN
1000.0000 ug | Freq: Once | INTRAMUSCULAR | Status: AC
  Administered 2017-03-01: 1000 ug via INTRAMUSCULAR

## 2017-03-01 NOTE — Progress Notes (Signed)
Remote pacemaker check. 

## 2017-03-01 NOTE — Telephone Encounter (Signed)
Pt brought handicap placard app for Dr Sarajane Jews to fill out. Pt provided a stamped return envelope for it to be mailed back to them. cb

## 2017-03-06 ENCOUNTER — Ambulatory Visit (INDEPENDENT_AMBULATORY_CARE_PROVIDER_SITE_OTHER): Payer: Medicare Other | Admitting: Cardiology

## 2017-03-06 ENCOUNTER — Encounter: Payer: Self-pay | Admitting: Cardiology

## 2017-03-06 VITALS — BP 132/62 | HR 69 | Ht 70.5 in | Wt 159.2 lb

## 2017-03-06 DIAGNOSIS — I480 Paroxysmal atrial fibrillation: Secondary | ICD-10-CM | POA: Diagnosis not present

## 2017-03-06 DIAGNOSIS — Z950 Presence of cardiac pacemaker: Secondary | ICD-10-CM

## 2017-03-06 DIAGNOSIS — I455 Other specified heart block: Secondary | ICD-10-CM

## 2017-03-06 DIAGNOSIS — I495 Sick sinus syndrome: Secondary | ICD-10-CM | POA: Diagnosis not present

## 2017-03-06 DIAGNOSIS — E784 Other hyperlipidemia: Secondary | ICD-10-CM

## 2017-03-06 DIAGNOSIS — E7849 Other hyperlipidemia: Secondary | ICD-10-CM

## 2017-03-06 DIAGNOSIS — I10 Essential (primary) hypertension: Secondary | ICD-10-CM

## 2017-03-06 NOTE — Patient Instructions (Signed)
Medication Instructions:  Your physician recommends that you continue on your current medications as directed. Please refer to the Current Medication list given to you today.  --- If you need a refill on your cardiac medications before your next appointment, please call your pharmacy. ---  Labwork: None ordered  Testing/Procedures: None ordered  Follow-Up: Remote monitoring is used to monitor your Pacemaker of ICD from home. This monitoring reduces the number of office visits required to check your device to one time per year. It allows [place] to keep an eye on the functioning of your device to ensure it is working properly. You are scheduled for a device check from home on 05/30/2017. You may send your transmission at any time that day. If you have a wireless device, the transmission will be sent automatically. After your physician reviews your transmission, you will receive a postcard with your next transmission date.  Your physician wants you to follow-up in: 1 year with Dr. Curt Bears.  You will receive a reminder letter in the mail two months in advance. If you don't receive a letter, please call our office to schedule the follow-up appointment.  Thank you for choosing [facility]!!   [name], RN [phone]

## 2017-03-06 NOTE — Progress Notes (Signed)
Electrophysiology Office Note   Date:  03/06/2017   ID:  Andrew, Norton 1927-06-26, MRN [id number]  PCP:  Laurey Morale, MD  Cardiologist:  Tamala Julian Primary Electrophysiologist:  Evon Lopezperez Meredith Leeds, MD    Chief Complaint  Patient presents with  . Pacemaker Check    Tachycardia-Bradycardia syndrome     History of Present Illness: Andrew Norton is a 83 y.o. male who presents today for electrophysiology evaluation.   History of HTN, DMT2, cervical myelopathy with gain instability who was directly admitted to [facility] on 05/17/16 for new onset of jaundice and abnormal CT ab/pel scan which showed "intrahepatic biliary ductal dilatation." He went into atrial fibrillation with rapid rates while in the hospital, and was placed on flecainide. He also had evidence of bradycardia, and thus had a Medtronic dual chamber pacemaker placed on 05/28/16.   Today, denies symptoms of palpitations, chest pain, shortness of breath, orthopnea, PND, lower extremity edema, claudication, dizziness, presyncope, syncope, bleeding, or neurologic sequela. The patient is tolerating medications without difficulties and is otherwise without complaint today.     Past Medical History:  Diagnosis Date  . Anemia   . Arthritis    hips  . Atrial fibrillation ([place])   . Carpal tunnel syndrome    left leg sciatica  . Diabetes mellitus   . Hearing loss    left ear greater loss  . History of shingles    past hx. left hand  . Hyperlipidemia   . Hypertension    EKG, chest x ray 11/15/10 EPIC,  clearance Dr Sharlene Motts on chart/ pt states on meds to "prevent hypertension from diabetes"  . Paget's disease    scrotum  . Paroxysmal atrial fibrillation ([place]) 05/18/2016  . Positive PPD, treated    Past Surgical History:  Procedure Laterality Date  . CERVICAL FUSION     dr Vertell Limber 2009- retained hardware  . colonoscopy  2-08   per Dr. Oletta Lamas, normal   . EP IMPLANTABLE DEVICE N/A 05/28/2016   Procedure: Pacemaker Implant;  Surgeon:  Jarom Govan Meredith Leeds, MD;  Location: [facility];  Service: Cardiovascular;  Laterality: N/A;  . ERCP N/A 05/22/2016   Procedure: ENDOSCOPIC RETROGRADE CHOLANGIOPANCREATOGRAPHY (ERCP);  Surgeon: Milus Banister, MD;  Location: [facility];  Service: Endoscopy;  Laterality: N/A;  . EYE SURGERY     bilateral cataract extraction with IOL  . HIP FRACTURE SURGERY  11-24-10   left hip per Dr, [name]  . LUMBAR LAMINECTOMY/DECOMPRESSION MICRODISCECTOMY  11/07/2011   Procedure: LUMBAR LAMINECTOMY/DECOMPRESSION MICRODISCECTOMY;  Surgeon: Johnn Hai, MD;  Location: [facility];  Service: Orthopedics;  Laterality: N/A;  Decompression of the L4 - L5 Central (X-ray)   . partial removal of scrotum    . TONSILLECTOMY    . TOTAL HIP REVISION Left 11/05/2012   Procedure: Conversion of a Bipolar to a Left Total Hip Arthroplasty;  Surgeon: [name], MD;  Location: [facility];  Service: Orthopedics;  Laterality: Left;  Conversion of a Bipolar to a Left Total Hip Arthroplasty     Current Outpatient Prescriptions  Medication Sig Dispense Refill  . cyanocobalamin (,VITAMIN B-12,) 1000 MCG/ML injection Inject 1,000 mcg into the muscle every 30 (thirty) days.    . cyclobenzaprine (FLEXERIL) 10 MG tablet TAKE 1 TABLET 3 TIMES DAILYAS NEEDED FOR MUSCLE SPASMS 270 tablet 3  . Ferrous Sulfate 220 (44 Fe) MG/5ML LIQD Take 7 mLs by mouth daily. TAKE 7 MLS BY MOUTH DAILY 946 mL 11  .  finasteride (PROSCAR) 5 MG tablet TAKE 1 TABLET DAILY 90 tablet 1  . flecainide (TAMBOCOR) 50 MG tablet Take 1 tablet (50 mg total) by mouth every 12 (twelve) hours. 180 tablet 3  . gabapentin (NEURONTIN) 400 MG capsule TAKE 1 CAPSULE 4 TIMES     DAILY 360 capsule 3  . glipiZIDE (GLUCOTROL) 10 MG tablet Take 0.5 tablets (5 mg total) by mouth 2 (two) times daily. 2 tablet 0  . glipiZIDE (GLUCOTROL) 10 MG tablet TAKE 1 TABLET TWICE A DAY  BEFORE MEALS 180 tablet 1  . Glucose Blood (TRUE METRIX BLOOD GLUCOSE TEST VI) by In Vitro  route 2 (two) times daily. Diagnosis code is E 11.9    . HYDROcodone-acetaminophen (NORCO/VICODIN) 5-325 MG tablet Take 1 tablet by mouth every 6 (six) hours as needed for moderate pain. 120 tablet 0  . lidocaine (LIDODERM) 5 % Place 1 patch onto the skin daily. Remove & Discard patch within 12 hours or as directed by MD 90 patch 3  . losartan (COZAAR) 50 MG tablet TAKE 1 TABLET EVERY MORNING 90 tablet 1  . metFORMIN (GLUCOPHAGE) 1000 MG tablet TAKE 1 TABLET TWICE A DAY  WITH MEALS 180 tablet 3  . NUTRITIONAL SUPPLEMENT LIQD Take 120 mLs by mouth 2 (two) times daily. SF MedPass for nutritional support    . polyethylene glycol (MIRALAX / GLYCOLAX) packet Take 17 g by mouth daily as needed for mild constipation.    . polyethylene glycol powder (GLYCOLAX/MIRALAX) powder Take 17 g by mouth daily. 3350 g 3  . rivaroxaban (XARELTO) 20 MG TABS tablet Take 1 tablet (20 mg total) by mouth daily with supper. 90 tablet 3  . sitaGLIPtin (JANUVIA) 100 MG tablet Take 1 tablet (100 mg total) by mouth daily. 90 tablet 3  . metoprolol tartrate (LOPRESSOR) 25 MG tablet Take 1 tablet (25 mg total) by mouth 2 (two) times daily. 180 tablet 3   No current facility-administered medications for this visit.     Allergies:   Penicillins and Ppd [tuberculin purified protein derivative]   Social History:  The patient  reports that he has never smoked. He has never used smokeless tobacco. He reports that he does not drink alcohol or use drugs.   Family History:  The patient's family history includes Heart failure in his father; Tuberculosis in his mother.    ROS:  Please see the history of present illness.   Otherwise, review of systems is positive for visual changes.   All other systems are reviewed and negative.   PHYSICAL EXAM: VS:  BP 132/62   Pulse 69   Ht 5' 10.5" (1.791 m)   Wt 159 lb 3.2 oz (72.2 kg)   BMI 22.52 kg/m  , BMI Body mass index is 22.52 kg/m. GEN: Well nourished, well developed, in no acute  distress  HEENT: normal  Neck: no JVD, carotid bruits, or masses Cardiac: RRR; no murmurs, rubs, or gallops,no edema  Respiratory:  clear to auscultation bilaterally, normal work of breathing GI: soft, nontender, nondistended, + BS MS: no deformity or atrophy  Skin: warm and dry, device site well healed Neuro:  Strength and sensation are intact Psych: euthymic mood, full affect  EKG:  EKG is ordered today. Personal review of the ekg ordered shows A paced, septal Q waves, rate 69  Personal review of the device interrogation today. Results in [place]: 05/30/2016: Magnesium 1.9 11/29/2016: ALT 13; BUN 16; Creatinine, Ser 0.79; Hemoglobin 10.5; Platelets 249.0;  Potassium 5.1; Sodium 135; TSH 3.06    Lipid Panel     Component Value Date/Time   CHOL 128 11/29/2016 1010   TRIG 126.0 11/29/2016 1010   HDL 47.00 11/29/2016 1010   CHOLHDL 3 11/29/2016 1010   VLDL 25.2 11/29/2016 1010   LDLCALC 56 11/29/2016 1010     Wt Readings from Last 3 Encounters:  03/06/17 159 lb 3.2 oz (72.2 kg)  11/29/16 159 lb (72.1 kg)  10/08/16 159 lb (72.1 kg)      Other studies Reviewed: Additional studies/ records that were reviewed today include: TTE 05/19/16  Review of the above records today demonstrates:  - Left ventricle: The cavity size was normal. Wall thickness was   increased in a pattern of mild LVH. Systolic function was normal.   The estimated ejection fraction was in the range of 55% to 60%.   Wall motion was normal; there were no regional wall motion   abnormalities. Doppler parameters are consistent with abnormal   left ventricular relaxation (grade 1 diastolic dysfunction). - Aortic valve: There was trivial regurgitation. - Mitral valve: Calcified annulus. - Pulmonary arteries: Systolic pressure was mildly increased. PA   peak pressure: 52 mm Hg (S).   ASSESSMENT AND PLAN:  1.  Tachybradycardia syndrome: Status post Medtronic dual chamber pacemaker implanted  05/29/16. Device functioning appropriately. Has had rising thresholds in both his atrial and ventricular leads, but they have stabilized. No changes at this time.  2. Paroxysmal atrial fibrillation: On flecainide, metoprolol, and Xarelto. Remains in sinus rhythm. No changes.  This patients CHA2DS2-VASc Score and unadjusted Ischemic Stroke Rate (% per year) is equal to 4.8 % stroke rate/year from a score of 4  Above score calculated as 1 point each if present [CHF, HTN, DM, Vascular=MI/PAD/Aortic Plaque, Age if 65-74, or Male] Above score calculated as 2 points each if present [Age > 75, or Stroke/TIA/TE]  3. Hypertension: Well-controlled today   4. Hyperlipidemia: Continue statin    Current medicines are reviewed at length with the patient today.   The patient does not have concerns regarding his medicines.  The following changes were made today:  none  Labs/ tests ordered today include:  No orders of the defined types were placed in this encounter.    Disposition:   FU with Geneva Barrero 12 months  Signed, Leiland Mihelich Meredith Leeds, MD  03/06/2017 11:40 AM     [facility] [address] [place]  [address] [phone] (office) [phone] (fax)

## 2017-03-07 LAB — CUP PACEART REMOTE DEVICE CHECK
Battery Voltage: 3.02 V
Brady Statistic AP VP Percent: 0.03 %
Brady Statistic AS VP Percent: 0.02 %
Brady Statistic RA Percent Paced: 62.41 %
Implantable Lead Implant Date: 20171113
Implantable Lead Location: 753859
Implantable Lead Location: 753860
Implantable Lead Model: 5076
Implantable Lead Model: 5076
Implantable Pulse Generator Implant Date: 20171113
Lead Channel Impedance Value: 342 Ohm
Lead Channel Impedance Value: 380 Ohm
Lead Channel Impedance Value: 418 Ohm
Lead Channel Pacing Threshold Pulse Width: 0.4 ms
Lead Channel Pacing Threshold Pulse Width: 0.4 ms
Lead Channel Sensing Intrinsic Amplitude: 5.625 mV
Lead Channel Setting Pacing Amplitude: 2.5 V
Lead Channel Setting Pacing Amplitude: 4 V
Lead Channel Setting Pacing Pulse Width: 0.8 ms
MDC IDC LEAD IMPLANT DT: 20171113
MDC IDC MSMT BATTERY REMAINING LONGEVITY: 110 mo
MDC IDC MSMT LEADCHNL RA PACING THRESHOLD AMPLITUDE: 1.5 V
MDC IDC MSMT LEADCHNL RA SENSING INTR AMPL: 1.5 mV
MDC IDC MSMT LEADCHNL RA SENSING INTR AMPL: 1.5 mV
MDC IDC MSMT LEADCHNL RV IMPEDANCE VALUE: 437 Ohm
MDC IDC MSMT LEADCHNL RV PACING THRESHOLD AMPLITUDE: 2.25 V
MDC IDC MSMT LEADCHNL RV SENSING INTR AMPL: 5.625 mV
MDC IDC SESS DTM: 20180816153748
MDC IDC SET LEADCHNL RV SENSING SENSITIVITY: 1.2 mV
MDC IDC STAT BRADY AP VS PERCENT: 62.43 %
MDC IDC STAT BRADY AS VS PERCENT: 37.52 %
MDC IDC STAT BRADY RV PERCENT PACED: 0.05 %

## 2017-03-07 LAB — CUP PACEART INCLINIC DEVICE CHECK
Battery Remaining Longevity: 104 mo
Brady Statistic AP VS Percent: 60.4 %
Brady Statistic AS VP Percent: 0.02 %
Brady Statistic AS VS Percent: 39.55 %
Date Time Interrogation Session: 20180822153837
Implantable Lead Implant Date: 20171113
Implantable Lead Location: 753859
Implantable Lead Location: 753860
Lead Channel Impedance Value: 342 Ohm
Lead Channel Impedance Value: 380 Ohm
Lead Channel Impedance Value: 437 Ohm
Lead Channel Pacing Threshold Amplitude: 1.25 V
Lead Channel Pacing Threshold Pulse Width: 0.8 ms
Lead Channel Sensing Intrinsic Amplitude: 7.375 mV
Lead Channel Setting Pacing Amplitude: 2.5 V
Lead Channel Setting Pacing Amplitude: 4 V
Lead Channel Setting Pacing Pulse Width: 0.8 ms
Lead Channel Setting Sensing Sensitivity: 1.2 mV
MDC IDC LEAD IMPLANT DT: 20171113
MDC IDC MSMT BATTERY VOLTAGE: 3.02 V
MDC IDC MSMT LEADCHNL RA PACING THRESHOLD PULSEWIDTH: 0.8 ms
MDC IDC MSMT LEADCHNL RA SENSING INTR AMPL: 1.25 mV
MDC IDC MSMT LEADCHNL RV IMPEDANCE VALUE: 437 Ohm
MDC IDC MSMT LEADCHNL RV PACING THRESHOLD AMPLITUDE: 1.75 V
MDC IDC PG IMPLANT DT: 20171113
MDC IDC STAT BRADY AP VP PERCENT: 0.03 %
MDC IDC STAT BRADY RA PERCENT PACED: 60.34 %
MDC IDC STAT BRADY RV PERCENT PACED: 0.05 %

## 2017-03-14 DIAGNOSIS — H353132 Nonexudative age-related macular degeneration, bilateral, intermediate dry stage: Secondary | ICD-10-CM | POA: Diagnosis not present

## 2017-03-14 DIAGNOSIS — H35433 Paving stone degeneration of retina, bilateral: Secondary | ICD-10-CM | POA: Diagnosis not present

## 2017-03-14 DIAGNOSIS — H35033 Hypertensive retinopathy, bilateral: Secondary | ICD-10-CM | POA: Diagnosis not present

## 2017-03-14 DIAGNOSIS — H34832 Tributary (branch) retinal vein occlusion, left eye, with macular edema: Secondary | ICD-10-CM | POA: Diagnosis not present

## 2017-03-15 ENCOUNTER — Encounter: Payer: Self-pay | Admitting: Cardiology

## 2017-04-01 ENCOUNTER — Ambulatory Visit (INDEPENDENT_AMBULATORY_CARE_PROVIDER_SITE_OTHER): Payer: Medicare Other | Admitting: *Deleted

## 2017-04-01 DIAGNOSIS — E538 Deficiency of other specified B group vitamins: Secondary | ICD-10-CM | POA: Diagnosis not present

## 2017-04-01 MED ORDER — CYANOCOBALAMIN 1000 MCG/ML IJ SOLN
1000.0000 ug | Freq: Once | INTRAMUSCULAR | Status: AC
  Administered 2017-04-01: 1000 ug via INTRAMUSCULAR

## 2017-04-11 ENCOUNTER — Telehealth: Payer: Self-pay | Admitting: Family Medicine

## 2017-04-11 MED ORDER — HYDROCODONE-ACETAMINOPHEN 5-325 MG PO TABS: 1.0000 | ORAL_TABLET | Freq: Four times a day (QID) | ORAL | 0 refills | Status: DC | PRN

## 2017-04-11 NOTE — Telephone Encounter (Signed)
Done

## 2017-04-11 NOTE — Telephone Encounter (Signed)
Pt request refill  °HYDROcodone-acetaminophen (NORCO/VICODIN) 5-325 MG tablet °

## 2017-04-12 NOTE — Telephone Encounter (Signed)
Pt is aware Rx ready for pick up

## 2017-04-16 NOTE — Progress Notes (Signed)
Pt came into office for monthly B12 injection. Patient tolerated injection well.  Dorrene German, RN

## 2017-04-22 DIAGNOSIS — H34832 Tributary (branch) retinal vein occlusion, left eye, with macular edema: Secondary | ICD-10-CM | POA: Diagnosis not present

## 2017-04-22 DIAGNOSIS — H353132 Nonexudative age-related macular degeneration, bilateral, intermediate dry stage: Secondary | ICD-10-CM | POA: Diagnosis not present

## 2017-04-22 DIAGNOSIS — H35033 Hypertensive retinopathy, bilateral: Secondary | ICD-10-CM | POA: Diagnosis not present

## 2017-04-22 DIAGNOSIS — H35433 Paving stone degeneration of retina, bilateral: Secondary | ICD-10-CM | POA: Diagnosis not present

## 2017-04-23 NOTE — Progress Notes (Signed)
Agree Alysia Penna, MD

## 2017-04-30 DIAGNOSIS — L84 Corns and callosities: Secondary | ICD-10-CM | POA: Diagnosis not present

## 2017-04-30 DIAGNOSIS — L602 Onychogryphosis: Secondary | ICD-10-CM | POA: Diagnosis not present

## 2017-04-30 DIAGNOSIS — E1351 Other specified diabetes mellitus with diabetic peripheral angiopathy without gangrene: Secondary | ICD-10-CM | POA: Diagnosis not present

## 2017-05-03 ENCOUNTER — Ambulatory Visit (INDEPENDENT_AMBULATORY_CARE_PROVIDER_SITE_OTHER): Payer: Medicare Other | Admitting: Emergency Medicine

## 2017-05-03 DIAGNOSIS — Z23 Encounter for immunization: Secondary | ICD-10-CM

## 2017-05-03 DIAGNOSIS — E538 Deficiency of other specified B group vitamins: Secondary | ICD-10-CM | POA: Diagnosis not present

## 2017-05-03 MED ORDER — CYANOCOBALAMIN 1000 MCG/ML IJ SOLN
1000.0000 ug | Freq: Once | INTRAMUSCULAR | Status: AC
  Administered 2017-05-03: 1000 ug via INTRAMUSCULAR

## 2017-05-06 DIAGNOSIS — H53413 Scotoma involving central area, bilateral: Secondary | ICD-10-CM | POA: Diagnosis not present

## 2017-05-13 DIAGNOSIS — H53413 Scotoma involving central area, bilateral: Secondary | ICD-10-CM | POA: Diagnosis not present

## 2017-05-27 DIAGNOSIS — H35033 Hypertensive retinopathy, bilateral: Secondary | ICD-10-CM | POA: Diagnosis not present

## 2017-05-27 DIAGNOSIS — H34832 Tributary (branch) retinal vein occlusion, left eye, with macular edema: Secondary | ICD-10-CM | POA: Diagnosis not present

## 2017-05-27 DIAGNOSIS — H353132 Nonexudative age-related macular degeneration, bilateral, intermediate dry stage: Secondary | ICD-10-CM | POA: Diagnosis not present

## 2017-05-27 DIAGNOSIS — H43813 Vitreous degeneration, bilateral: Secondary | ICD-10-CM | POA: Diagnosis not present

## 2017-05-29 DIAGNOSIS — H53413 Scotoma involving central area, bilateral: Secondary | ICD-10-CM | POA: Diagnosis not present

## 2017-05-30 ENCOUNTER — Telehealth: Payer: Self-pay | Admitting: Cardiology

## 2017-05-30 ENCOUNTER — Encounter: Payer: Medicare Other | Admitting: *Deleted

## 2017-05-30 NOTE — Telephone Encounter (Signed)
LMOVM reminding pt to send remote transmission.   

## 2017-05-31 ENCOUNTER — Encounter: Payer: Self-pay | Admitting: Cardiology

## 2017-06-03 ENCOUNTER — Ambulatory Visit (INDEPENDENT_AMBULATORY_CARE_PROVIDER_SITE_OTHER): Payer: Medicare Other | Admitting: *Deleted

## 2017-06-03 DIAGNOSIS — I495 Sick sinus syndrome: Secondary | ICD-10-CM | POA: Diagnosis not present

## 2017-06-03 NOTE — Progress Notes (Signed)
Remote pacemaker transmission.   

## 2017-06-04 ENCOUNTER — Encounter: Payer: Self-pay | Admitting: Family Medicine

## 2017-06-04 ENCOUNTER — Ambulatory Visit (INDEPENDENT_AMBULATORY_CARE_PROVIDER_SITE_OTHER): Payer: Medicare Other | Admitting: Family Medicine

## 2017-06-04 VITALS — BP 132/60 | HR 62 | Temp 97.7°F | Wt 160.8 lb

## 2017-06-04 DIAGNOSIS — I480 Paroxysmal atrial fibrillation: Secondary | ICD-10-CM | POA: Diagnosis not present

## 2017-06-04 DIAGNOSIS — E119 Type 2 diabetes mellitus without complications: Secondary | ICD-10-CM

## 2017-06-04 DIAGNOSIS — E538 Deficiency of other specified B group vitamins: Secondary | ICD-10-CM

## 2017-06-04 DIAGNOSIS — I10 Essential (primary) hypertension: Secondary | ICD-10-CM

## 2017-06-04 DIAGNOSIS — E785 Hyperlipidemia, unspecified: Secondary | ICD-10-CM | POA: Diagnosis not present

## 2017-06-04 DIAGNOSIS — E1169 Type 2 diabetes mellitus with other specified complication: Secondary | ICD-10-CM

## 2017-06-04 LAB — CUP PACEART REMOTE DEVICE CHECK
Battery Voltage: 3.02 V
Brady Statistic AP VP Percent: 0.01 %
Brady Statistic RA Percent Paced: 65.28 %
Brady Statistic RV Percent Paced: 0.01 %
Date Time Interrogation Session: 20181117164521
Implantable Lead Location: 753860
Implantable Pulse Generator Implant Date: 20171113
Lead Channel Impedance Value: 380 Ohm
Lead Channel Impedance Value: 437 Ohm
Lead Channel Pacing Threshold Amplitude: 1.5 V
Lead Channel Pacing Threshold Pulse Width: 0.4 ms
Lead Channel Pacing Threshold Pulse Width: 0.4 ms
Lead Channel Setting Pacing Amplitude: 2.5 V
Lead Channel Setting Sensing Sensitivity: 1.2 mV
MDC IDC LEAD IMPLANT DT: 20171113
MDC IDC LEAD IMPLANT DT: 20171113
MDC IDC LEAD LOCATION: 753859
MDC IDC MSMT BATTERY REMAINING LONGEVITY: 103 mo
MDC IDC MSMT LEADCHNL RA IMPEDANCE VALUE: 361 Ohm
MDC IDC MSMT LEADCHNL RA IMPEDANCE VALUE: 399 Ohm
MDC IDC MSMT LEADCHNL RA SENSING INTR AMPL: 1.375 mV
MDC IDC MSMT LEADCHNL RA SENSING INTR AMPL: 1.375 mV
MDC IDC MSMT LEADCHNL RV PACING THRESHOLD AMPLITUDE: 2.5 V
MDC IDC MSMT LEADCHNL RV SENSING INTR AMPL: 6 mV
MDC IDC MSMT LEADCHNL RV SENSING INTR AMPL: 6 mV
MDC IDC SET LEADCHNL RV PACING AMPLITUDE: 4 V
MDC IDC SET LEADCHNL RV PACING PULSEWIDTH: 0.8 ms
MDC IDC STAT BRADY AP VS PERCENT: 65.31 %
MDC IDC STAT BRADY AS VP PERCENT: 0 %
MDC IDC STAT BRADY AS VS PERCENT: 34.68 %

## 2017-06-04 LAB — POCT GLYCOSYLATED HEMOGLOBIN (HGB A1C): HEMOGLOBIN A1C: 6.2

## 2017-06-04 MED ORDER — CYANOCOBALAMIN 1000 MCG/ML IJ SOLN
1000.0000 ug | Freq: Once | INTRAMUSCULAR | Status: AC
  Administered 2017-06-04: 1000 ug via INTRAMUSCULAR

## 2017-06-04 NOTE — Progress Notes (Signed)
   Subjective:    Patient ID: Andrew Norton, male    DOB: [birth year], 83 y.o.   MRN: [id number]  HPI Here to follow up. He feels well. His BP is stable. His A1c today is excellent.    Review of Systems  Constitutional: Negative.   Respiratory: Negative.   Cardiovascular: Negative.   Neurological: Negative.        Objective:   Physical Exam  Constitutional: He is oriented to person, place, and time. He appears well-developed and well-nourished.  Neck: No thyromegaly present.  Cardiovascular: Normal rate, regular rhythm, normal heart sounds and intact distal pulses.  Pulmonary/Chest: Effort normal and breath sounds normal. No respiratory distress. He has no wheezes. He has no rales.  Lymphadenopathy:    He has no cervical adenopathy.  Neurological: He is alert and oriented to person, place, and time.          Assessment & Plan:  His HTN and diabetes are well controlled. Neurologically he is stable.  Alysia Penna, MD

## 2017-06-07 ENCOUNTER — Encounter: Payer: Self-pay | Admitting: Family Medicine

## 2017-06-07 ENCOUNTER — Telehealth: Payer: Self-pay | Admitting: Family Medicine

## 2017-06-07 NOTE — Telephone Encounter (Signed)
Encounter >> Jun 07, 2017  9:27 AM [name] wrote: CRM for notification. See Telephone encounter for: 06/07/17.    HYDROcodone-acetaminophen (NORCO/VICODIN) 5-325 MG tablet

## 2017-06-07 NOTE — Telephone Encounter (Signed)
Copied from [place]. Topic: Quick Communication - See Telephone Encounter >> Jun 07, 2017  9:27 AM [name] wrote: CRM for notification. See Telephone encounter for: 06/07/17.    HYDROcodone-acetaminophen (NORCO/VICODIN) 5-325 MG tablet

## 2017-06-07 NOTE — Telephone Encounter (Signed)
Error

## 2017-06-07 NOTE — Telephone Encounter (Signed)
This encounter was created in error - please disregard.

## 2017-06-10 ENCOUNTER — Telehealth: Payer: Self-pay | Admitting: Family Medicine

## 2017-06-10 MED ORDER — HYDROCODONE-ACETAMINOPHEN 5-325 MG PO TABS: 1.0000 | ORAL_TABLET | Freq: Four times a day (QID) | ORAL | 0 refills | Status: DC | PRN

## 2017-06-10 NOTE — Telephone Encounter (Signed)
Rx placed up front for pick up. Pt advised and voiced understanding.

## 2017-06-10 NOTE — Telephone Encounter (Signed)
Done

## 2017-06-10 NOTE — Telephone Encounter (Signed)
Duplicated

## 2017-06-10 NOTE — Telephone Encounter (Signed)
Sent to PCP for approval. Rx was last refilled on 04/11/2017 disp 120 with no refills.

## 2017-06-10 NOTE — Telephone Encounter (Signed)
Copied from [place]. Topic: Quick Communication - See Telephone Encounter >> Jun 07, 2017  9:27 AM [name] wrote: CRM for notification. See Telephone encounter for: 06/07/17.    HYDROcodone-acetaminophen (NORCO/VICODIN) 5-325 MG tablet

## 2017-06-10 NOTE — Telephone Encounter (Signed)
Copied from [place]. Topic: Quick Communication - See Telephone Encounter >> Jun 07, 2017  9:27 AM [name] wrote: CRM for notification. See Telephone encounter for: 06/07/17.    HYDROcodone-acetaminophen (NORCO/VICODIN) 5-325 MG tablet

## 2017-06-10 NOTE — Addendum Note (Signed)
Addended by: Alysia Penna A on: 06/10/2017 04:14 PM   Modules accepted: Orders

## 2017-06-11 DIAGNOSIS — H53413 Scotoma involving central area, bilateral: Secondary | ICD-10-CM | POA: Diagnosis not present

## 2017-06-13 ENCOUNTER — Encounter: Payer: Self-pay | Admitting: Cardiology

## 2017-07-01 DIAGNOSIS — H353132 Nonexudative age-related macular degeneration, bilateral, intermediate dry stage: Secondary | ICD-10-CM | POA: Diagnosis not present

## 2017-07-01 DIAGNOSIS — H43391 Other vitreous opacities, right eye: Secondary | ICD-10-CM | POA: Diagnosis not present

## 2017-07-01 DIAGNOSIS — H34832 Tributary (branch) retinal vein occlusion, left eye, with macular edema: Secondary | ICD-10-CM | POA: Diagnosis not present

## 2017-07-01 DIAGNOSIS — H43813 Vitreous degeneration, bilateral: Secondary | ICD-10-CM | POA: Diagnosis not present

## 2017-07-04 ENCOUNTER — Ambulatory Visit (INDEPENDENT_AMBULATORY_CARE_PROVIDER_SITE_OTHER): Payer: Medicare Other

## 2017-07-04 DIAGNOSIS — E538 Deficiency of other specified B group vitamins: Secondary | ICD-10-CM

## 2017-07-04 DIAGNOSIS — E1351 Other specified diabetes mellitus with diabetic peripheral angiopathy without gangrene: Secondary | ICD-10-CM | POA: Diagnosis not present

## 2017-07-04 DIAGNOSIS — L602 Onychogryphosis: Secondary | ICD-10-CM | POA: Diagnosis not present

## 2017-07-04 MED ORDER — CYANOCOBALAMIN 1000 MCG/ML IJ SOLN
1000.0000 ug | Freq: Once | INTRAMUSCULAR | Status: AC
  Administered 2017-07-04: 1000 ug via INTRAMUSCULAR

## 2017-07-09 ENCOUNTER — Other Ambulatory Visit: Payer: Self-pay | Admitting: Family Medicine

## 2017-07-11 NOTE — Telephone Encounter (Signed)
Last OV 06/04/2017. Glipizide last refilled 10/08/2016. Losartan last refilled 01/17/2017 disp 90 with 1 refill. RX sent

## 2017-07-18 ENCOUNTER — Other Ambulatory Visit: Payer: Self-pay | Admitting: Family Medicine

## 2017-07-18 ENCOUNTER — Telehealth: Payer: Self-pay | Admitting: Cardiology

## 2017-07-18 ENCOUNTER — Other Ambulatory Visit: Payer: Self-pay | Admitting: *Deleted

## 2017-07-18 MED ORDER — FLECAINIDE ACETATE 50 MG PO TABS: 50.0000 mg | ORAL_TABLET | Freq: Two times a day (BID) | ORAL | 2 refills | Status: AC

## 2017-07-18 MED ORDER — METOPROLOL TARTRATE 25 MG PO TABS: 25.0000 mg | ORAL_TABLET | Freq: Two times a day (BID) | ORAL | 2 refills | Status: DC

## 2017-07-18 MED ORDER — FINASTERIDE 5 MG PO TABS: 5.0000 mg | ORAL_TABLET | Freq: Every day | ORAL | 1 refills | Status: DC

## 2017-07-18 NOTE — Telephone Encounter (Signed)
Copied from [place] [phone]. Topic: Quick Communication - Rx Refill/Question >> Jul 18, 2017  4:12 PM Patrice Paradise wrote: Has the patient contacted their pharmacy? Yes.   finasteride (PROSCAR) 5 MG tablet  (Agent: If no, request that the patient contact the pharmacy for the refill.)  Preferred Pharmacy (with phone number or street name):  [facility] [address] [facility] [address] [place] [address] Phone: [phone] Fax: [phone]   Agent: Please be advised that RX refills may take up to 3 business days. We ask that you follow-up with your pharmacy.

## 2017-07-18 NOTE — Telephone Encounter (Signed)
Last office visit 06/04/17; last refill 10/02/16: #90, RF X 1.  Completed refill per pt. req.

## 2017-07-18 NOTE — Telephone Encounter (Signed)
New Message   *STAT* If patient is at the pharmacy, call can be transferred to refill team.   1. Which medications need to be refilled? (please list name of each medication and dose if known) finasteride 5mg   2. Which pharmacy/location (including street and city if local pharmacy) [facility]  3. Do they need a 30 day or 90 day supply? [place]

## 2017-07-18 NOTE — Telephone Encounter (Signed)
Called pt and spoke with pt's wife [name] explaining to her that Dr. Curt Bears did not prescribe Finasteride and she would have to call the pt's PCP to request a refill for this medication. I refilled pt's other heart medications and sent them to the pt's pharmacy as requested. Confirmation received. I advise the wife that if the pt has any other problems, questions or concerns to call our office. Wife verbalized understanding.

## 2017-07-30 DIAGNOSIS — Z85820 Personal history of malignant melanoma of skin: Secondary | ICD-10-CM | POA: Diagnosis not present

## 2017-07-30 DIAGNOSIS — L820 Inflamed seborrheic keratosis: Secondary | ICD-10-CM | POA: Diagnosis not present

## 2017-07-30 DIAGNOSIS — C44329 Squamous cell carcinoma of skin of other parts of face: Secondary | ICD-10-CM | POA: Diagnosis not present

## 2017-07-30 DIAGNOSIS — L570 Actinic keratosis: Secondary | ICD-10-CM | POA: Diagnosis not present

## 2017-07-30 DIAGNOSIS — Z1283 Encounter for screening for malignant neoplasm of skin: Secondary | ICD-10-CM | POA: Diagnosis not present

## 2017-07-30 DIAGNOSIS — D045 Carcinoma in situ of skin of trunk: Secondary | ICD-10-CM | POA: Diagnosis not present

## 2017-07-30 DIAGNOSIS — X32XXXD Exposure to sunlight, subsequent encounter: Secondary | ICD-10-CM | POA: Diagnosis not present

## 2017-07-30 DIAGNOSIS — Z08 Encounter for follow-up examination after completed treatment for malignant neoplasm: Secondary | ICD-10-CM | POA: Diagnosis not present

## 2017-08-01 ENCOUNTER — Telehealth: Payer: Self-pay | Admitting: Family Medicine

## 2017-08-01 NOTE — Telephone Encounter (Signed)
Rx refill request

## 2017-08-01 NOTE — Telephone Encounter (Signed)
Copied from [place] [phone]. Topic: General - Other >> Aug 01, 2017  1:39 PM Neva Seat wrote: Hydrocodone-Acetamin  5-325 mg  Pt ws told by pharmacy - needs new refills.   Please call pt to let him know when he can come by to pickup the written Rx at the [place] office.

## 2017-08-02 MED ORDER — HYDROCODONE-ACETAMINOPHEN 5-325 MG PO TABS: 1.0000 | ORAL_TABLET | Freq: Four times a day (QID) | ORAL | 0 refills | Status: DC | PRN

## 2017-08-02 NOTE — Telephone Encounter (Signed)
Last OV 06/04/2017.  Rx was last refilled 06/10/2017 disp 120 with no refills.  Sent to PCP for approval.

## 2017-08-02 NOTE — Telephone Encounter (Signed)
This was sent in to [facility] He will need a PMV for any more

## 2017-08-02 NOTE — Telephone Encounter (Signed)
Called and spoke with pt's wife. Wife advised and voiced understanding.  

## 2017-08-05 DIAGNOSIS — H35033 Hypertensive retinopathy, bilateral: Secondary | ICD-10-CM | POA: Diagnosis not present

## 2017-08-05 DIAGNOSIS — H43813 Vitreous degeneration, bilateral: Secondary | ICD-10-CM | POA: Diagnosis not present

## 2017-08-05 DIAGNOSIS — H34832 Tributary (branch) retinal vein occlusion, left eye, with macular edema: Secondary | ICD-10-CM | POA: Diagnosis not present

## 2017-08-05 DIAGNOSIS — H353132 Nonexudative age-related macular degeneration, bilateral, intermediate dry stage: Secondary | ICD-10-CM | POA: Diagnosis not present

## 2017-08-16 ENCOUNTER — Ambulatory Visit (INDEPENDENT_AMBULATORY_CARE_PROVIDER_SITE_OTHER): Payer: Medicare Other

## 2017-08-16 DIAGNOSIS — E538 Deficiency of other specified B group vitamins: Secondary | ICD-10-CM | POA: Diagnosis not present

## 2017-08-16 DIAGNOSIS — E118 Type 2 diabetes mellitus with unspecified complications: Secondary | ICD-10-CM

## 2017-08-16 LAB — POCT GLYCOSYLATED HEMOGLOBIN (HGB A1C): Hemoglobin A1C: 5.7

## 2017-08-16 MED ORDER — CYANOCOBALAMIN 1000 MCG/ML IJ SOLN
1000.0000 ug | Freq: Once | INTRAMUSCULAR | Status: AC
  Administered 2017-08-16: 1000 ug via INTRAMUSCULAR

## 2017-08-16 NOTE — Progress Notes (Signed)
Per orders of Dr. Sarajane Jews, injection of Vitamin b12 given by Abelardo Diesel. Patient tolerated injection well.   POC A1C completed.

## 2017-08-21 ENCOUNTER — Ambulatory Visit (INDEPENDENT_AMBULATORY_CARE_PROVIDER_SITE_OTHER)
Admission: RE | Admit: 2017-08-21 | Discharge: 2017-08-21 | Disposition: A | Discharge status: Home / Self Care | Payer: Medicare Other | Source: Ambulatory Visit | Attending: Family Medicine | Admitting: Family Medicine

## 2017-08-21 ENCOUNTER — Ambulatory Visit (INDEPENDENT_AMBULATORY_CARE_PROVIDER_SITE_OTHER): Payer: Medicare Other | Admitting: Family Medicine

## 2017-08-21 ENCOUNTER — Other Ambulatory Visit: Payer: Medicare Other

## 2017-08-21 ENCOUNTER — Encounter: Payer: Self-pay | Admitting: Family Medicine

## 2017-08-21 VITALS — BP 140/60 | HR 84 | Temp 98.4°F | Wt 157.8 lb

## 2017-08-21 DIAGNOSIS — R05 Cough: Secondary | ICD-10-CM | POA: Diagnosis not present

## 2017-08-21 DIAGNOSIS — I10 Essential (primary) hypertension: Secondary | ICD-10-CM

## 2017-08-21 DIAGNOSIS — R059 Cough, unspecified: Secondary | ICD-10-CM

## 2017-08-21 DIAGNOSIS — E538 Deficiency of other specified B group vitamins: Secondary | ICD-10-CM

## 2017-08-21 LAB — HEPATIC FUNCTION PANEL
ALBUMIN: 3.2 g/dL — AB (ref 3.5–5.2)
ALT: 32 U/L (ref 0–53)
AST: 36 U/L (ref 0–37)
Alkaline Phosphatase: 162 U/L — ABNORMAL HIGH (ref 39–117)
BILIRUBIN TOTAL: 1 mg/dL (ref 0.2–1.2)
Bilirubin, Direct: 0.4 mg/dL — ABNORMAL HIGH (ref 0.0–0.3)
Total Protein: 6.8 g/dL (ref 6.0–8.3)

## 2017-08-21 LAB — TSH: TSH: 2.66 u[IU]/mL (ref 0.35–4.50)

## 2017-08-21 LAB — POC URINALSYSI DIPSTICK (AUTOMATED)
Glucose, UA: NEGATIVE
KETONES UA: NEGATIVE
LEUKOCYTES UA: NEGATIVE
NITRITE UA: NEGATIVE
PH UA: 6 (ref 5.0–8.0)
RBC UA: NEGATIVE
Spec Grav, UA: 1.02 (ref 1.010–1.025)
Urobilinogen, UA: 1 E.U./dL

## 2017-08-21 LAB — CBC WITH DIFFERENTIAL/PLATELET
BASOS ABS: 0 10*3/uL (ref 0.0–0.1)
Basophils Relative: 0.2 % (ref 0.0–3.0)
EOS ABS: 0 10*3/uL (ref 0.0–0.7)
Eosinophils Relative: 0.1 % (ref 0.0–5.0)
HCT: 29.1 % — ABNORMAL LOW (ref 39.0–52.0)
Hemoglobin: 9.7 g/dL — ABNORMAL LOW (ref 13.0–17.0)
LYMPHS ABS: 0.7 10*3/uL (ref 0.7–4.0)
LYMPHS PCT: 5.2 % — AB (ref 12.0–46.0)
MCHC: 33.4 g/dL (ref 30.0–36.0)
MCV: 91.3 fl (ref 78.0–100.0)
Monocytes Absolute: 0.9 10*3/uL (ref 0.1–1.0)
Monocytes Relative: 6.9 % (ref 3.0–12.0)
NEUTROS ABS: 12 10*3/uL — AB (ref 1.4–7.7)
NEUTROS PCT: 87.6 % — AB (ref 43.0–77.0)
PLATELETS: 326 10*3/uL (ref 150.0–400.0)
RBC: 3.18 Mil/uL — AB (ref 4.22–5.81)
RDW: 14 % (ref 11.5–15.5)
WBC: 13.7 10*3/uL — ABNORMAL HIGH (ref 4.0–10.5)

## 2017-08-21 LAB — BASIC METABOLIC PANEL
BUN: 27 mg/dL — AB (ref 6–23)
CO2: 23 mEq/L (ref 19–32)
CREATININE: 0.97 mg/dL (ref 0.40–1.50)
Calcium: 8.4 mg/dL (ref 8.4–10.5)
Chloride: 96 mEq/L (ref 96–112)
GFR: 77.47 mL/min (ref >60.00)
GLUCOSE: 196 mg/dL — AB (ref 70–99)
Potassium: 4.5 mEq/L (ref 3.5–5.1)
Sodium: 130 mEq/L — ABNORMAL LOW (ref 135–145)

## 2017-08-21 LAB — VITAMIN B12: Vitamin B-12: 940 pg/mL — ABNORMAL HIGH (ref 211–911)

## 2017-08-21 MED ORDER — AZITHROMYCIN 250 MG PO TABS: ORAL_TABLET | ORAL | 0 refills | Status: DC

## 2017-08-21 NOTE — Progress Notes (Signed)
   Subjective:    Patient ID: IBN STIEF, male    DOB: 10-Aug-1926, 84 y.o.   MRN: [id number]  HPI Here with his wife for several days of generalized weakness and intermittent confusion. At times he forgets where he is and has to ask what he is doing. He has been sleeping more than usual, and he evens nods off to sleep while sitting at a meal. About 2 weeks ago he developed a cough that has persisted. No chest pain or fever. No SOB. The cough is dry. His appetite is poor but he is drinking fluids. His A1c a few days ago was excellent at 5.7.    Review of Systems  Constitutional: Positive for fatigue. Negative for chills, diaphoresis and fever.  HENT: Negative.   Eyes: Negative.   Respiratory: Positive for cough. Negative for chest tightness, shortness of breath and wheezing.   Cardiovascular: Negative.   Gastrointestinal: Negative.   Genitourinary: Negative.   Neurological: Positive for weakness.  Psychiatric/Behavioral: Positive for confusion. Negative for agitation and hallucinations.       Objective:   Physical Exam  Constitutional: He is oriented to person, place, and time.  Alert, oriented. Using a walker   Neck: No thyromegaly present.  Cardiovascular: Normal rate, regular rhythm, normal heart sounds and intact distal pulses.  Pulmonary/Chest: Effort normal. No respiratory distress. He has no rales.  Soft scattered rhonchi and wheezes  Abdominal: Soft. Bowel sounds are normal. He exhibits no distension and no mass. There is no tenderness. There is no rebound and no guarding.  Musculoskeletal: He exhibits no edema.  Lymphadenopathy:    He has no cervical adenopathy.  Neurological: He is alert and oriented to person, place, and time.          Assessment & Plan:  Has has a cough and is now weak and confused. I think either bronchitis or pneumonia are possible etiologies. He will start taking a Zpack. Drink plenty of fluids. We will get labs today with a UA, and he will also  get a CXR today.  Alysia Penna, MD

## 2017-08-22 ENCOUNTER — Other Ambulatory Visit: Payer: Self-pay | Admitting: Cardiology

## 2017-08-22 ENCOUNTER — Ambulatory Visit: Payer: Medicare Other | Admitting: Neurology

## 2017-08-22 ENCOUNTER — Telehealth: Payer: Self-pay | Admitting: *Deleted

## 2017-08-22 NOTE — Telephone Encounter (Signed)
Canceled follow up same day due to sickness. 

## 2017-08-23 ENCOUNTER — Ambulatory Visit (INDEPENDENT_AMBULATORY_CARE_PROVIDER_SITE_OTHER): Payer: Medicare Other | Admitting: Family Medicine

## 2017-08-23 ENCOUNTER — Encounter: Payer: Self-pay | Admitting: Neurology

## 2017-08-23 ENCOUNTER — Inpatient Hospital Stay (HOSPITAL_COMMUNITY): Payer: Medicare Other

## 2017-08-23 ENCOUNTER — Encounter (HOSPITAL_COMMUNITY): Payer: Self-pay

## 2017-08-23 ENCOUNTER — Encounter: Payer: Self-pay | Admitting: Family Medicine

## 2017-08-23 ENCOUNTER — Other Ambulatory Visit: Payer: Self-pay

## 2017-08-23 ENCOUNTER — Inpatient Hospital Stay (HOSPITAL_COMMUNITY)
Admission: EM | Admit: 2017-08-23 | Discharge: 2017-08-28 | DRG: 194 | Disposition: A | Discharge status: Skilled Nursing Facility | Payer: Medicare Other | Attending: Internal Medicine | Admitting: Internal Medicine

## 2017-08-23 ENCOUNTER — Emergency Department (HOSPITAL_COMMUNITY): Payer: Medicare Other

## 2017-08-23 VITALS — BP 132/64 | HR 77 | Temp 98.0°F

## 2017-08-23 DIAGNOSIS — Z7984 Long term (current) use of oral hypoglycemic drugs: Secondary | ICD-10-CM | POA: Diagnosis not present

## 2017-08-23 DIAGNOSIS — N401 Enlarged prostate with lower urinary tract symptoms: Secondary | ICD-10-CM | POA: Diagnosis present

## 2017-08-23 DIAGNOSIS — Z880 Allergy status to penicillin: Secondary | ICD-10-CM | POA: Diagnosis not present

## 2017-08-23 DIAGNOSIS — J181 Lobar pneumonia, unspecified organism: Principal | ICD-10-CM | POA: Diagnosis present

## 2017-08-23 DIAGNOSIS — H9193 Unspecified hearing loss, bilateral: Secondary | ICD-10-CM | POA: Diagnosis present

## 2017-08-23 DIAGNOSIS — E11649 Type 2 diabetes mellitus with hypoglycemia without coma: Secondary | ICD-10-CM | POA: Diagnosis present

## 2017-08-23 DIAGNOSIS — I82409 Acute embolism and thrombosis of unspecified deep veins of unspecified lower extremity: Secondary | ICD-10-CM | POA: Diagnosis not present

## 2017-08-23 DIAGNOSIS — E538 Deficiency of other specified B group vitamins: Secondary | ICD-10-CM | POA: Diagnosis present

## 2017-08-23 DIAGNOSIS — D509 Iron deficiency anemia, unspecified: Secondary | ICD-10-CM | POA: Diagnosis not present

## 2017-08-23 DIAGNOSIS — Z950 Presence of cardiac pacemaker: Secondary | ICD-10-CM

## 2017-08-23 DIAGNOSIS — Z887 Allergy status to serum and vaccine status: Secondary | ICD-10-CM | POA: Diagnosis not present

## 2017-08-23 DIAGNOSIS — R7989 Other specified abnormal findings of blood chemistry: Secondary | ICD-10-CM | POA: Diagnosis not present

## 2017-08-23 DIAGNOSIS — K5900 Constipation, unspecified: Secondary | ICD-10-CM | POA: Diagnosis not present

## 2017-08-23 DIAGNOSIS — E1142 Type 2 diabetes mellitus with diabetic polyneuropathy: Secondary | ICD-10-CM | POA: Diagnosis present

## 2017-08-23 DIAGNOSIS — R52 Pain, unspecified: Secondary | ICD-10-CM | POA: Diagnosis not present

## 2017-08-23 DIAGNOSIS — D638 Anemia in other chronic diseases classified elsewhere: Secondary | ICD-10-CM | POA: Diagnosis present

## 2017-08-23 DIAGNOSIS — E785 Hyperlipidemia, unspecified: Secondary | ICD-10-CM | POA: Diagnosis present

## 2017-08-23 DIAGNOSIS — D649 Anemia, unspecified: Secondary | ICD-10-CM | POA: Diagnosis not present

## 2017-08-23 DIAGNOSIS — F05 Delirium due to known physiological condition: Secondary | ICD-10-CM | POA: Diagnosis present

## 2017-08-23 DIAGNOSIS — M62838 Other muscle spasm: Secondary | ICD-10-CM | POA: Diagnosis not present

## 2017-08-23 DIAGNOSIS — R0603 Acute respiratory distress: Secondary | ICD-10-CM | POA: Diagnosis present

## 2017-08-23 DIAGNOSIS — R945 Abnormal results of liver function studies: Secondary | ICD-10-CM | POA: Diagnosis not present

## 2017-08-23 DIAGNOSIS — E860 Dehydration: Secondary | ICD-10-CM | POA: Diagnosis present

## 2017-08-23 DIAGNOSIS — I10 Essential (primary) hypertension: Secondary | ICD-10-CM | POA: Diagnosis present

## 2017-08-23 DIAGNOSIS — R3915 Urgency of urination: Secondary | ICD-10-CM | POA: Diagnosis present

## 2017-08-23 DIAGNOSIS — M889 Osteitis deformans of unspecified bone: Secondary | ICD-10-CM | POA: Diagnosis present

## 2017-08-23 DIAGNOSIS — K7689 Other specified diseases of liver: Secondary | ICD-10-CM

## 2017-08-23 DIAGNOSIS — E118 Type 2 diabetes mellitus with unspecified complications: Secondary | ICD-10-CM | POA: Diagnosis present

## 2017-08-23 DIAGNOSIS — R4182 Altered mental status, unspecified: Secondary | ICD-10-CM | POA: Diagnosis not present

## 2017-08-23 DIAGNOSIS — R001 Bradycardia, unspecified: Secondary | ICD-10-CM | POA: Diagnosis not present

## 2017-08-23 DIAGNOSIS — R531 Weakness: Secondary | ICD-10-CM | POA: Diagnosis not present

## 2017-08-23 DIAGNOSIS — R410 Disorientation, unspecified: Secondary | ICD-10-CM | POA: Diagnosis not present

## 2017-08-23 DIAGNOSIS — F0390 Unspecified dementia without behavioral disturbance: Secondary | ICD-10-CM | POA: Diagnosis present

## 2017-08-23 DIAGNOSIS — R41841 Cognitive communication deficit: Secondary | ICD-10-CM | POA: Diagnosis not present

## 2017-08-23 DIAGNOSIS — I4891 Unspecified atrial fibrillation: Secondary | ICD-10-CM | POA: Diagnosis not present

## 2017-08-23 DIAGNOSIS — R0602 Shortness of breath: Secondary | ICD-10-CM | POA: Diagnosis not present

## 2017-08-23 DIAGNOSIS — Z7901 Long term (current) use of anticoagulants: Secondary | ICD-10-CM | POA: Diagnosis not present

## 2017-08-23 DIAGNOSIS — J189 Pneumonia, unspecified organism: Secondary | ICD-10-CM

## 2017-08-23 DIAGNOSIS — N400 Enlarged prostate without lower urinary tract symptoms: Secondary | ICD-10-CM | POA: Diagnosis not present

## 2017-08-23 DIAGNOSIS — Z111 Encounter for screening for respiratory tuberculosis: Secondary | ICD-10-CM | POA: Diagnosis not present

## 2017-08-23 DIAGNOSIS — E119 Type 2 diabetes mellitus without complications: Secondary | ICD-10-CM | POA: Diagnosis not present

## 2017-08-23 DIAGNOSIS — R279 Unspecified lack of coordination: Secondary | ICD-10-CM | POA: Diagnosis not present

## 2017-08-23 DIAGNOSIS — R1314 Dysphagia, pharyngoesophageal phase: Secondary | ICD-10-CM | POA: Diagnosis not present

## 2017-08-23 DIAGNOSIS — I480 Paroxysmal atrial fibrillation: Secondary | ICD-10-CM | POA: Diagnosis present

## 2017-08-23 DIAGNOSIS — M6281 Muscle weakness (generalized): Secondary | ICD-10-CM | POA: Diagnosis not present

## 2017-08-23 DIAGNOSIS — E871 Hypo-osmolality and hyponatremia: Secondary | ICD-10-CM | POA: Diagnosis not present

## 2017-08-23 DIAGNOSIS — R740 Nonspecific elevation of levels of transaminase and lactic acid dehydrogenase [LDH]: Secondary | ICD-10-CM | POA: Diagnosis present

## 2017-08-23 DIAGNOSIS — E539 Vitamin B deficiency, unspecified: Secondary | ICD-10-CM | POA: Diagnosis not present

## 2017-08-23 DIAGNOSIS — N3941 Urge incontinence: Secondary | ICD-10-CM | POA: Diagnosis not present

## 2017-08-23 LAB — URINALYSIS, ROUTINE W REFLEX MICROSCOPIC
Bilirubin Urine: NEGATIVE
Glucose, UA: NEGATIVE mg/dL
Hgb urine dipstick: NEGATIVE
Ketones, ur: NEGATIVE mg/dL
Leukocytes, UA: NEGATIVE
NITRITE: NEGATIVE
PH: 5 (ref 5.0–8.0)
Protein, ur: NEGATIVE mg/dL
Specific Gravity, Urine: 1.015 (ref 1.005–1.030)

## 2017-08-23 LAB — GLUCOSE, CAPILLARY
GLUCOSE-CAPILLARY: 49 mg/dL — AB (ref 65–99)
GLUCOSE-CAPILLARY: 87 mg/dL (ref 65–99)
Glucose-Capillary: 57 mg/dL — ABNORMAL LOW (ref 65–99)
Glucose-Capillary: 143 mg/dL — ABNORMAL HIGH (ref 65–99)

## 2017-08-23 LAB — CBC WITH DIFFERENTIAL/PLATELET
BASOS PCT: 0 %
Basophils Absolute: 0 10*3/uL (ref 0.0–0.1)
EOS ABS: 0.1 10*3/uL (ref 0.0–0.7)
Eosinophils Relative: 1 %
HEMATOCRIT: 27.3 % — AB (ref 39.0–52.0)
HEMOGLOBIN: 9.1 g/dL — AB (ref 13.0–17.0)
LYMPHS ABS: 1.1 10*3/uL (ref 0.7–4.0)
Lymphocytes Relative: 11 %
MCH: 29.7 pg (ref 26.0–34.0)
MCHC: 33.3 g/dL (ref 30.0–36.0)
MCV: 89.2 fL (ref 78.0–100.0)
MONOS PCT: 11 %
Monocytes Absolute: 1.1 10*3/uL — ABNORMAL HIGH (ref 0.1–1.0)
NEUTROS ABS: 7.6 10*3/uL (ref 1.7–7.7)
NEUTROS PCT: 77 %
Platelets: 343 10*3/uL (ref 150–400)
RBC: 3.06 MIL/uL — AB (ref 4.22–5.81)
RDW: 13.5 % (ref 11.5–15.5)
WBC: 10 10*3/uL (ref 4.0–10.5)

## 2017-08-23 LAB — COMPREHENSIVE METABOLIC PANEL
ALT: 48 U/L (ref 17–63)
ANION GAP: 12 (ref 5–15)
AST: 58 U/L — ABNORMAL HIGH (ref 15–41)
Albumin: 3 g/dL — ABNORMAL LOW (ref 3.5–5.0)
Alkaline Phosphatase: 212 U/L — ABNORMAL HIGH (ref 38–126)
BUN: 21 mg/dL — ABNORMAL HIGH (ref 6–20)
CHLORIDE: 95 mmol/L — AB (ref 101–111)
CO2: 22 mmol/L (ref 22–32)
CREATININE: 0.91 mg/dL (ref 0.61–1.24)
Calcium: 8.5 mg/dL — ABNORMAL LOW (ref 8.9–10.3)
Glucose, Bld: 77 mg/dL (ref 65–99)
POTASSIUM: 4.2 mmol/L (ref 3.5–5.1)
SODIUM: 129 mmol/L — AB (ref 135–145)
Total Bilirubin: 0.4 mg/dL (ref 0.3–1.2)
Total Protein: 7.6 g/dL (ref 6.5–8.1)

## 2017-08-23 LAB — EXPECTORATED SPUTUM ASSESSMENT W GRAM STAIN, RFLX TO RESP C

## 2017-08-23 LAB — INFLUENZA PANEL BY PCR (TYPE A & B)
INFLAPCR: NEGATIVE
INFLBPCR: NEGATIVE

## 2017-08-23 LAB — EXPECTORATED SPUTUM ASSESSMENT W REFEX TO RESP CULTURE: SPECIAL REQUESTS: NORMAL

## 2017-08-23 LAB — I-STAT CG4 LACTIC ACID, ED: LACTIC ACID, VENOUS: 2.82 mmol/L — AB (ref 0.5–1.9)

## 2017-08-23 MED ORDER — GLIPIZIDE 5 MG PO TABS: 5.0000 mg | ORAL_TABLET | Freq: Two times a day (BID) | ORAL | Status: DC

## 2017-08-23 MED ORDER — SODIUM CHLORIDE 0.9 % IV SOLN
INTRAVENOUS | Status: AC
  Administered 2017-08-23: 21:00:00 via INTRAVENOUS

## 2017-08-23 MED ORDER — METFORMIN HCL 500 MG PO TABS
1000.0000 mg | ORAL_TABLET | Freq: Two times a day (BID) | ORAL | Status: DC
  Administered 2017-08-24 – 2017-08-28 (×9): 1000 mg via ORAL
  Filled 2017-08-23 (×9): qty 2

## 2017-08-23 MED ORDER — FINASTERIDE 5 MG PO TABS
5.0000 mg | ORAL_TABLET | Freq: Every day | ORAL | Status: DC
  Administered 2017-08-24 – 2017-08-28 (×5): 5 mg via ORAL
  Filled 2017-08-23 (×5): qty 1

## 2017-08-23 MED ORDER — LOSARTAN POTASSIUM 50 MG PO TABS
50.0000 mg | ORAL_TABLET | Freq: Every morning | ORAL | Status: DC
  Administered 2017-08-24 – 2017-08-28 (×5): 50 mg via ORAL
  Filled 2017-08-23 (×5): qty 1

## 2017-08-23 MED ORDER — DEXTROSE 50 % IV SOLN
INTRAVENOUS | Status: AC
  Administered 2017-08-23: 22:00:00
  Filled 2017-08-23: qty 50

## 2017-08-23 MED ORDER — FLECAINIDE ACETATE 50 MG PO TABS
50.0000 mg | ORAL_TABLET | Freq: Two times a day (BID) | ORAL | Status: DC
  Administered 2017-08-23 – 2017-08-28 (×10): 50 mg via ORAL
  Filled 2017-08-23 (×11): qty 1

## 2017-08-23 MED ORDER — HYDROCODONE-ACETAMINOPHEN 5-325 MG PO TABS
1.0000 | ORAL_TABLET | Freq: Four times a day (QID) | ORAL | Status: DC | PRN
  Administered 2017-08-26 – 2017-08-27 (×2): 1 via ORAL
  Filled 2017-08-23 (×2): qty 1

## 2017-08-23 MED ORDER — POLYETHYLENE GLYCOL 3350 17 G PO PACK: 17.0000 g | PACK | Freq: Every day | ORAL | Status: DC | PRN

## 2017-08-23 MED ORDER — GABAPENTIN 400 MG PO CAPS
400.0000 mg | ORAL_CAPSULE | Freq: Four times a day (QID) | ORAL | Status: DC
  Administered 2017-08-23 – 2017-08-28 (×19): 400 mg via ORAL
  Filled 2017-08-23 (×19): qty 1

## 2017-08-23 MED ORDER — SODIUM CHLORIDE 0.9 % IV BOLUS (SEPSIS)
1000.0000 mL | Freq: Once | INTRAVENOUS | Status: AC
  Administered 2017-08-23: 1000 mL via INTRAVENOUS

## 2017-08-23 MED ORDER — INSULIN ASPART 100 UNIT/ML ~~LOC~~ SOLN: 0.0000 [IU] | Freq: Every day | SUBCUTANEOUS | Status: DC

## 2017-08-23 MED ORDER — ENSURE ENLIVE PO LIQD
120.0000 mL | Freq: Two times a day (BID) | ORAL | Status: DC
  Administered 2017-08-24 – 2017-08-27 (×5): 120 mL via ORAL
  Filled 2017-08-23 (×5): qty 237

## 2017-08-23 MED ORDER — POLYETHYLENE GLYCOL 3350 17 G PO PACK
17.0000 g | PACK | ORAL | Status: DC | PRN
  Filled 2017-08-23: qty 1

## 2017-08-23 MED ORDER — CYCLOBENZAPRINE HCL 10 MG PO TABS
10.0000 mg | ORAL_TABLET | Freq: Three times a day (TID) | ORAL | Status: DC | PRN
  Administered 2017-08-23 – 2017-08-27 (×2): 10 mg via ORAL
  Filled 2017-08-23 (×2): qty 1

## 2017-08-23 MED ORDER — METOPROLOL TARTRATE 25 MG PO TABS
25.0000 mg | ORAL_TABLET | Freq: Two times a day (BID) | ORAL | Status: DC
  Administered 2017-08-23 – 2017-08-28 (×9): 25 mg via ORAL
  Filled 2017-08-23 (×10): qty 1

## 2017-08-23 MED ORDER — RIVAROXABAN 20 MG PO TABS
20.0000 mg | ORAL_TABLET | Freq: Every day | ORAL | Status: DC
  Administered 2017-08-23 – 2017-08-27 (×5): 20 mg via ORAL
  Filled 2017-08-23 (×5): qty 1

## 2017-08-23 MED ORDER — LEVOFLOXACIN IN D5W 750 MG/150ML IV SOLN: 750.0000 mg | INTRAVENOUS | Status: DC

## 2017-08-23 MED ORDER — LEVOFLOXACIN IN D5W 750 MG/150ML IV SOLN
750.0000 mg | Freq: Once | INTRAVENOUS | Status: AC
  Administered 2017-08-23: 750 mg via INTRAVENOUS
  Filled 2017-08-23: qty 150

## 2017-08-23 MED ORDER — HYDRALAZINE HCL 20 MG/ML IJ SOLN: 5.0000 mg | Freq: Four times a day (QID) | INTRAMUSCULAR | Status: DC | PRN

## 2017-08-23 MED ORDER — FERROUS SULFATE 300 (60 FE) MG/5ML PO SYRP
300.0000 mg | ORAL_SOLUTION | Freq: Every day | ORAL | Status: DC
  Administered 2017-08-24 – 2017-08-28 (×5): 300 mg via ORAL
  Filled 2017-08-23 (×5): qty 5

## 2017-08-23 MED ORDER — LINAGLIPTIN 5 MG PO TABS
5.0000 mg | ORAL_TABLET | Freq: Every day | ORAL | Status: DC
  Administered 2017-08-24 – 2017-08-28 (×5): 5 mg via ORAL
  Filled 2017-08-23 (×5): qty 1

## 2017-08-23 MED ORDER — INSULIN ASPART 100 UNIT/ML ~~LOC~~ SOLN
0.0000 [IU] | Freq: Three times a day (TID) | SUBCUTANEOUS | Status: DC
  Administered 2017-08-24 – 2017-08-25 (×4): 2 [IU] via SUBCUTANEOUS
  Administered 2017-08-25: 3 [IU] via SUBCUTANEOUS
  Administered 2017-08-26: 5 [IU] via SUBCUTANEOUS
  Administered 2017-08-26: 3 [IU] via SUBCUTANEOUS
  Administered 2017-08-26: 2 [IU] via SUBCUTANEOUS
  Administered 2017-08-27: 3 [IU] via SUBCUTANEOUS
  Administered 2017-08-27: 1 [IU] via SUBCUTANEOUS
  Administered 2017-08-28: 3 [IU] via SUBCUTANEOUS
  Administered 2017-08-28: 2 [IU] via SUBCUTANEOUS

## 2017-08-23 NOTE — Progress Notes (Signed)
Pharmacy Antibiotic Note  Andrew Norton is a 84 y.o. male admitted on 08/23/2017 with pneumonia.  Pharmacy has been consulted for levofloxacin dosing. Unfortunately, it appears he was given azithromcyin monotherapy for CAP as outpatient.   Plan:  Continue levofloxacin 750mg  IV q24h as ordered by MD  Pharmacy to sign-off  Patient was given ceftriaxone x 1 in 2016, consider change to ceftriaxone/azithomycin to avoid side effect profile of quinolones  Height: 5\' 10"  (177.8 cm) Weight: 157 lb 6.4 oz (71.4 kg) IBW/kg (Calculated) : 73  Temp (24hrs), Avg:98 F (36.7 C), Min:97.7 F (36.5 C), Max:98.3 F (36.8 C)  Recent Labs  Lab 08/21/17 1342 08/23/17 1607 08/23/17 1625  WBC 13.7* 10.0  --   CREATININE 0.97 0.91  --   LATICACIDVEN  --   --  2.82*    Estimated Creatinine Clearance: 56.7 mL/min (by C-G formula based on SCr of 0.91 mg/dL).    Allergies  Allergen Reactions  . Penicillins Hives    Has patient had a PCN reaction causing immediate rash, facial/tongue/throat swelling, SOB or lightheadedness with hypotension: Yes Has patient had a PCN reaction causing severe rash involving mucus membranes or skin necrosis: No Has patient had a PCN reaction that required hospitalization: Yes Has patient had a PCN reaction occurring within the last 10 years:No If all of the above answers are "NO", then may proceed with Cephalosporin use.   Marland Kitchen Ppd [Tuberculin Purified Protein Derivative]     Antimicrobials this admission: 2/8 levofloxacin  Dose adjustments this admission:  Microbiology results: 2/8 resp virus panel: 2/8 legionella Ag: 2/8 flu panel: neg 2/8 BCx:  Sputum: prdered  Thank you for allowing pharmacy to be a part of this patient's care.  Clovis Riley 08/23/2017 10:07 PM

## 2017-08-23 NOTE — ED Notes (Signed)
Report was given to [facility].  Pt requests to go to the bathroom and family is helping him.  Will transport pt up once

## 2017-08-23 NOTE — H&P (Signed)
TRH H&P   Patient Demographics:    Andrew Norton, is a 84 y.o. male  MRN: [id number]   DOB - 08/31/1926  Admit Date - 08/23/2017  Outpatient Primary MD for the patient is Laurey Morale, MD  Referring MD/NP/PA:    Shirlyn Goltz  Outpatient Specialists:    Patient coming from: home  Chief Complaint  Patient presents with  . Pneumonia      HPI:    Andrew Norton  is a 84 y.o. male, w hypertension, hyperlipidemia, dm2, Pafib , w c/o cough w yellow sputum for the past 2 weeks. Slight dyspnea and mild ams (wasn't quite acting right) and slight subjective fever. .  Pt was seen by pcp  Wednesday and had CXR ? Right middle lobe pneumonia and placed on zpak.  Over the past 2 days slightly more confused, and dyspnea.  Pt went to see pcp for follow up and was sent to ED for evaluation.   In Ed,  CXR  IMPRESSION: Aortic atherosclerosis. Stable right basilar opacity is noted most consistent with pneumonia. Followup PA and lateral chest X-ray is recommended in 3-4 weeks following trial of antibiotic therapy to ensure resolution and exclude underlying malignancy.  Na 129, K 4.2, Bun 21, Creatinine 0.91, Ast 58, Alt 48 Alk phos 212, T. Bili 0.4  ED concerned about pneumonia,and possible early sepsis.  Pt will be admitted for Cap (failure of outpatient abx zpak)      Review of systems:    In addition to the HPI above,    No Fever-chills, No Headache, No changes with Vision or hearing, No problems swallowing food or Liquids, No Chest pain, Cough or Shortness of Breath, No Abdominal pain, No Nausea or Vommitting, Bowel movements are regular, No Blood in stool or Urine, No dysuria, No new skin rashes or bruises, No new joints pains-aches,  No new weakness, tingling, numbness in any extremity, No recent weight gain or loss, No polyuria, polydypsia or polyphagia, No significant Mental  Stressors.  A full 10 point Review of Systems was done, except as stated above, all other Review of Systems were negative.   With Past History of the following :    Past Medical History:  Diagnosis Date  . Anemia   . Arthritis    hips  . Atrial fibrillation ([place])   . Carpal tunnel syndrome    left leg sciatica  . Diabetes mellitus   . Hearing loss    left ear greater loss  . History of shingles    past hx. left hand  . Hyperlipidemia   . Hypertension    EKG, chest x ray 11/15/10 EP[facility],  clearance Dr Sharlene Motts on chart/ pt states on meds to "prevent hypertension from diabetes"  . Paget's disease    scrotum  . Paroxysmal atrial fibrillation ([place]) 05/18/2016  . Positive PPD, treated  Past Surgical History:  Procedure Laterality Date  . CERVICAL FUSION     dr Vertell Limber 2009- retained hardware  . colonoscopy  2-08   per Dr. Oletta Lamas, normal   . EP IMPLANTABLE DEVICE N/A 05/28/2016   Procedure: Pacemaker Implant;  Surgeon: Will Meredith Leeds, MD;  Location: [facility];  Service: Cardiovascular;  Laterality: N/A;  . ERCP N/A 05/22/2016   Procedure: ENDOSCOPIC RETROGRADE CHOLANGIOPANCREATOGRAPHY (ERCP);  Surgeon: Milus Banister, MD;  Location: [facility];  Service: Endoscopy;  Laterality: N/A;  . EYE SURGERY     bilateral cataract extraction with IOL  . HIP FRACTURE SURGERY  11-24-10   left hip per Dr, [name]  . LUMBAR LAMINECTOMY/DECOMPRESSION MICRODISCECTOMY  11/07/2011   Procedure: LUMBAR LAMINECTOMY/DECOMPRESSION MICRODISCECTOMY;  Surgeon: Johnn Hai, MD;  Location: [facility];  Service: Orthopedics;  Laterality: N/A;  Decompression of the L4 - L5 Central (X-ray)   . partial removal of scrotum    . TONSILLECTOMY    . TOTAL HIP REVISION Left 11/05/2012   Procedure: Conversion of a Bipolar to a Left Total Hip Arthroplasty;  Surgeon: [name], MD;  Location: [facility];  Service: Orthopedics;  Laterality: Left;  Conversion of a Bipolar to a Left Total Hip  Arthroplasty      Social History:     Social History   Tobacco Use  . Smoking status: Never Smoker  . Smokeless tobacco: Never Used  Substance Use Topics  . Alcohol use: No    Alcohol/week: 0.0 oz     Lives - at home  Mobility - walks by self   Family History :     Family History  Problem Relation Age of Onset  . Tuberculosis Mother   . Heart failure Father      Home Medications:   Prior to Admission medications   Medication Sig Start Date End Date Taking? Authorizing Provider  azithromycin (ZITHROMAX Z-PAK) 250 MG tablet As directed 08/21/17  Yes Laurey Morale, MD  cyanocobalamin (,VITAMIN B-12,) 1000 MCG/ML injection Inject 1,000 mcg into the muscle every 30 (thirty) days.   Yes [provider]  cyclobenzaprine (FLEXERIL) 10 MG tablet TAKE 1 TABLET 3 TIMES DAILYAS NEEDED FOR MUSCLE SPASMS 08/08/16  Yes Laurey Morale, MD  Ferrous Sulfate 220 (44 Fe) MG/5ML LIQD Take 7 mLs by mouth daily. TAKE 7 MLS BY MOUTH DAILY 10/08/16  Yes Laurey Morale, MD  finasteride (PROSCAR) 5 MG tablet Take 1 tablet (5 mg total) by mouth daily. 07/18/17  Yes Laurey Morale, MD  flecainide (TAMBOCOR) 50 MG tablet Take 1 tablet (50 mg total) by mouth every 12 (twelve) hours. 07/18/17  Yes Camnitz, Will Hassell Done, MD  gabapentin (NEURONTIN) 400 MG capsule TAKE 1 CAPSULE 4 TIMES     DAILY 06/25/16  Yes Laurey Morale, MD  glipiZIDE (GLUCOTROL) 10 MG tablet Take 0.5 tablets (5 mg total) by mouth 2 (two) times daily. 10/08/16  Yes Laurey Morale, MD  HYDROcodone-acetaminophen (NORCO/VICODIN) 5-325 MG tablet Take 1 tablet by mouth every 6 (six) hours as needed for moderate pain. 08/02/17  Yes Laurey Morale, MD  lidocaine (LIDODERM) 5 % Place 1 patch onto the skin daily. Remove & Discard patch within 12 hours or as directed by MD 05/31/15  Yes Laurey Morale, MD  losartan (COZAAR) 50 MG tablet TAKE 1 TABLET EVERY MORNING 07/11/17  Yes Laurey Morale, MD  metFORMIN (GLUCOPHAGE) 1000 MG tablet TAKE 1  TABLET TWICE A DAY  WITH MEALS 08/08/16  Yes Laurey Morale, MD  metoprolol tartrate (LOPRESSOR) 25 MG tablet Take 1 tablet (25 mg total) by mouth 2 (two) times daily. 07/18/17  Yes Camnitz, Ocie Doyne, MD  NUTRITIONAL SUPPLEMENT LIQD Take 120 mLs by mouth 2 (two) times daily. SF [employer] for nutritional support   Yes [provider]  polyethylene glycol powder (GLYCOLAX/MIRALAX) powder Take 17 g by mouth daily. Patient taking differently: Take 17 g by mouth as needed.  01/18/17  Yes Laurey Morale, MD  rivaroxaban (XARELTO) 20 MG TABS tablet Take 1 tablet (20 mg total) by mouth daily with supper. 08/30/16  Yes Camnitz, Will Hassell Done, MD  sitaGLIPtin (JANUVIA) 100 MG tablet Take 1 tablet (100 mg total) by mouth daily. 11/29/16  Yes Laurey Morale, MD  glipiZIDE (GLUCOTROL) 10 MG tablet TAKE 1 TABLET TWICE A DAY  BEFORE MEALS Patient not taking: Reported on 08/23/2017 07/11/17   Laurey Morale, MD  Glucose Blood (TRUE METRIX BLOOD GLUCOSE TEST VI) by In Vitro route 2 (two) times daily. Diagnosis code is E 11.9    [provider]     Allergies:     Allergies  Allergen Reactions  . Penicillins Hives    Has patient had a PCN reaction causing immediate rash, facial/tongue/throat swelling, SOB or lightheadedness with hypotension: Yes Has patient had a PCN reaction causing severe rash involving mucus membranes or skin necrosis: No Has patient had a PCN reaction that required hospitalization: Yes Has patient had a PCN reaction occurring within the last 10 years:No If all of the above answers are "NO", then may proceed with Cephalosporin use.   Marland Kitchen Ppd [Tuberculin Purified Protein Derivative]      Physical Exam:   Vitals  Blood pressure (!) 144/87, pulse 61, temperature 98.3 F (36.8 C), temperature source Oral, resp. rate 18, SpO2 98 %.   1. General lying in bed in NAD,    2. Normal affect and insight, Not Suicidal or Homicidal, Awake Alert, Oriented X 3.  3. No F.N deficits, ALL  C.Nerves Intact, Strength 5/5 all 4 extremities, Sensation intact all 4 extremities, Plantars down going.  4. Ears and Eyes appear Normal, Conjunctivae clear, PERRLA. Moist Oral Mucosa.  5. Supple Neck, No JVD, No cervical lymphadenopathy appriciated, No Carotid Bruits.  6. Symmetrical Chest wall movement, Good air movement bilaterally, slight crackles right lung base, no wheeze.   7. RRR, No Gallops, Rubs or Murmurs, No Parasternal Heave.  8. Positive Bowel Sounds, Abdomen Soft, No tenderness, No organomegaly appriciated,No rebound -guarding or rigidity.  9.  No Cyanosis, Normal Skin Turgor, No Skin Rash or Bruise.  10. Good muscle tone,  joints appear normal , no effusions, Normal ROM.  11. No Palpable Lymph Nodes in Neck or Axillae     Data Review:    CBC Recent Labs  Lab 08/21/17 1342 08/23/17 1607  WBC 13.7* 10.0  HGB 9.7* 9.1*  HCT 29.1* 27.3*  PLT 326.0 343  MCV 91.3 89.2  MCH  --  29.7  MCHC 33.4 33.3  RDW 14.0 13.5  LYMPHSABS 0.7 1.1  MONOABS 0.9 1.1*  EOSABS 0.0 0.1  BASOSABS 0.0 0.0   ------------------------------------------------------------------------------------------------------------------  Chemistries  Recent Labs  Lab 08/21/17 1342 08/23/17 1607  NA 130* 129*  K 4.5 4.2  CL 96 95*  CO2 23 22  GLUCOSE 196* 77  BUN 27* 21*  CREATININE 0.97 0.91  CALCIUM 8.4 8.5*  AST 36 58*  ALT 32 48  ALKPHOS  162* 212*  BILITOT 1.0 0.4   ------------------------------------------------------------------------------------------------------------------ estimated creatinine clearance is 56.8 mL/min (by C-G formula based on SCr of 0.91 mg/dL). ------------------------------------------------------------------------------------------------------------------ Recent Labs    08/21/17 1342  TSH 2.66    Coagulation profile No results for input(s): INR, PROTIME in the last 168  hours. ------------------------------------------------------------------------------------------------------------------- No results for input(s): DDIMER in the last 72 hours. -------------------------------------------------------------------------------------------------------------------  Cardiac Enzymes No results for input(s): CKMB, TROPONINI, MYOGLOBIN in the last 168 hours.  Invalid input(s): CK ------------------------------------------------------------------------------------------------------------------ No results found for: BNP   ---------------------------------------------------------------------------------------------------------------  Urinalysis    Component Value Date/Time   COLORURINE YELLOW 08/23/2017 [place] 08/23/2017 1614   LABSPEC 1.015 08/23/2017 1614   PHURINE 5.0 08/23/2017 1614   GLUCOSEU NEGATIVE 08/23/2017 1614   HGBUR NEGATIVE 08/23/2017 1614   HGBUR negative 01/12/2008 0000   BILIRUBINUR NEGATIVE 08/23/2017 1614   BILIRUBINUR 1+ 08/21/2017 1651   KETONESUR NEGATIVE 08/23/2017 1614   PROTEINUR NEGATIVE 08/23/2017 1614   UROBILINOGEN 1.0 08/21/2017 1651   UROBILINOGEN 1.0 04/13/2015 0022   NITRITE NEGATIVE 08/23/2017 1614   LEUKOCYTESUR NEGATIVE 08/23/2017 1614    ----------------------------------------------------------------------------------------------------------------   Imaging Results:    Dg Chest 2 View  Result Date: 08/23/2017 CLINICAL DATA:  Pneumonia. EXAM: CHEST  2 VIEW COMPARISON:  Radiographs of August 21, 2017. FINDINGS: The heart size and mediastinal contours are within normal limits. Atherosclerosis of thoracic aorta is noted. Left-sided pacemaker is unchanged in position. No pneumothorax or pleural effusion is noted. Stable diffuse reticular densities are noted throughout both lungs consistent with chronic interstitial lung disease or scarring. Stable right basilar opacity is noted concerning for pneumonia.  The visualized skeletal structures are unremarkable. IMPRESSION: Aortic atherosclerosis. Stable right basilar opacity is noted most consistent with pneumonia. Followup PA and lateral chest X-ray is recommended in 3-4 weeks following trial of antibiotic therapy to ensure resolution and exclude underlying malignancy. Electronically Signed   By: Marijo Conception, M.D.   On: 08/23/2017 16:33      Assessment & Plan:    Active Problems:   Anemia   Diabetes mellitus type 2 with complications (HCC)   Hyponatremia    CAP Blood culture x2 Sputum gram stain culture Urine strep antigen Urine legionella antigen Influenza , resp viral panel Start Levaquin 776m iv qday (pharmacy to dose)  Hyponatremia Hydrate with ns gently,  Check cmp in am  Anemia Cont ferrous sulfate Check cbc in am  Dm2 fsbs ac and qhs, ISS Cont metformin, cont trajenta DC Glipizide (drug interaction w levaquin => hypoglycemia)  Abnormal liver function Check acute hepatitis panel, check GGT Consider AMA Check RUQ ultrasound Check cmp in am  Hypertension Cont losartan  Pafib Cont metoprolol Cont Tambocor Cont Xarelto pharmacy to dose  Bph Cont finasteride      DVT Prophylaxis Xarelto- SCDs  AM Labs Ordered, also please review Full Orders  Family Communication: Admission, patients condition and plan of care including tests being ordered have been discussed with the patient  who indicate understanding and agree with the plan and Code Status.  Code Status  FULL CODE  Likely DC to  home  Condition GUARDED   Consults called:  none  Admission status: inpatient  Time spent in minutes : 45   JJani GravelM.D on 08/23/2017 at 7:13 PM  Between 7am to 7pm - Pager - [phone] After 7pm go to [email or web address] - password [email or web address] - Office  [phone]

## 2017-08-23 NOTE — Progress Notes (Signed)
Blood sugar 57, orange juice with sugar added given, Dr. Maudie Mercury notified. Rechecked blood sugar, reading 49 despite orange juice and [employer] that patient was eating. Dr. Maudie Mercury notified again, given verbal order for an amp of D50. Pt is alert and oriented in bed, voices no complaints. Family at bedside. Will continue to monitor closely at this time.

## 2017-08-23 NOTE — ED Provider Notes (Signed)
[facility] DEPT Provider Note   CSN: [id number] Arrival date & time: 08/23/17  1515     History   Chief Complaint Chief Complaint  Patient presents with  . Pneumonia    HPI HAZEN BRUMETT is a 84 y.o. male history of atrial fibrillation on Xarelto, diabetes, hypertension, here presenting with altered mental status, shortness of breath.  Patient has been coughing for the last several weeks.  Patient had low-grade temperature for the last several days and went to see primary care doctor 2 days ago.  He had a chest x-ray at that time that showed a right middle lobe pneumonia and patient was put on a Z-Pak.  Over the last 2 days, patient was noted to be progressively more confused and disoriented.  Some subjective shortness of breath as well but denies any fevers.  Patient is compliant with his Xarelto.  Denies any sick contacts.  Patient went to see primary care doctor and was sent here for admission for IV antibiotics.  The history is provided by the patient.    Past Medical History:  Diagnosis Date  . Anemia   . Arthritis    hips  . Atrial fibrillation ([place])   . Carpal tunnel syndrome    left leg sciatica  . Diabetes mellitus   . Hearing loss    left ear greater loss  . History of shingles    past hx. left hand  . Hyperlipidemia   . Hypertension    EKG, chest x ray 11/15/10 EPIC,  clearance Dr Sharlene Motts on chart/ pt states on meds to "prevent hypertension from diabetes"  . Paget's disease    scrotum  . Paroxysmal atrial fibrillation ([place]) 05/18/2016  . Positive PPD, treated     Patient Active Problem List   Diagnosis Date Noted  . Hyponatremia 08/23/2017  . CAP (community acquired pneumonia) 08/23/2017  . Neuropathy 05/31/2016  . Gallbladder & bile duct stone with obstruction   . Tachy-brady syndrome ([place]) 05/21/2016  . Sinus pause 05/21/2016  . NSVT (nonsustained ventricular tachycardia) ([place]) 05/21/2016  . Bile duct obstruction   . Abnormal CT  of the abdomen   . Paroxysmal atrial fibrillation ([place]) 05/18/2016  . Protein-calorie malnutrition, severe 05/18/2016  . Jaundice 05/17/2016  . Diabetes mellitus type 2, noninsulin dependent ([place]) 05/17/2016  . LFT elevation 05/17/2016  . Leukocytosis 05/17/2016  . 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 Constipation 07/18/2015  . Aspiration pneumonia ([place]) 04/12/2015  . Sepsis ([place]) 04/12/2015  . Hyperlipidemia 04/12/2015  . Diabetes mellitus type 2 with complications ([place]) 40/98/1191  . Lobar pneumonia due to unspecified organism 04/12/2015  . Cervical myelopathy ([place]) 11/10/2013  . Cervical disc disorder with radiculopathy of cervical region 07/01/2013  . Abnormality of gait 07/01/2013  . Unspecified arthropathy, pelvic region and thigh 11/27/2012  . Type 2 diabetes mellitus without complication, without long-term current use of insulin ([place]) 11/27/2012  . Unspecified hereditary and idiopathic peripheral neuropathy 11/27/2012  . Acute posthemorrhagic anemia 11/27/2012  . Pain with hip hemiarthroplasty ([place]) 11/05/2012  . HYPERKALEMIA 10/26/2009  . Vitamin B12 deficiency 09/27/2009  . CARPAL TUNNEL SYNDROME 09/27/2009  . 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 CHEST PAIN 12/01/2008  . NECK PAIN, CHRONIC 07/01/2008  . METHYLMALONIC ACIDEMIA 02/09/2008  . ANXIETY STATE, UNSPECIFIED 12/29/2007  . LEG CRAMPS, NOCTURNAL 12/29/2007  . SHINGLES 12/15/2007  . HEARING LOSS 08/11/2007  . DYSFUNCTION OF EUSTACHIAN TUBE 08/04/2007  . BACK PAIN, LUMBAR 07/30/2007  . Essential hypertension 07/21/2007  .  ACUTE BRONCHITIS 07/21/2007  . Hyperlipidemia associated with type 2 diabetes mellitus ([place]) 05/12/2007  . Disorder resulting from impaired renal function 05/12/2007  . Anemia 12/11/2006  . HAY FEVER 12/11/2006  . PAGET'S DISEASE 12/11/2006  . TB SKIN TEST, POSITIVE 12/11/2006    Past Surgical History:  Procedure Laterality Date  . CERVICAL FUSION     dr Vertell Limber 2009- retained  hardware  . colonoscopy  2-08   per Dr. Oletta Lamas, normal   . EP IMPLANTABLE DEVICE N/A 05/28/2016   Procedure: Pacemaker Implant;  Surgeon: Will Meredith Leeds, MD;  Location: [facility];  Service: Cardiovascular;  Laterality: N/A;  . ERCP N/A 05/22/2016   Procedure: ENDOSCOPIC RETROGRADE CHOLANGIOPANCREATOGRAPHY (ERCP);  Surgeon: Milus Banister, MD;  Location: [facility];  Service: Endoscopy;  Laterality: N/A;  . EYE SURGERY     bilateral cataract extraction with IOL  . HIP FRACTURE SURGERY  11-24-10   left hip per Dr, [name]  . LUMBAR LAMINECTOMY/DECOMPRESSION MICRODISCECTOMY  11/07/2011   Procedure: LUMBAR LAMINECTOMY/DECOMPRESSION MICRODISCECTOMY;  Surgeon: Johnn Hai, MD;  Location: [facility];  Service: Orthopedics;  Laterality: N/A;  Decompression of the L4 - L5 Central (X-ray)   . partial removal of scrotum    . TONSILLECTOMY    . TOTAL HIP REVISION Left 11/05/2012   Procedure: Conversion of a Bipolar to a Left Total Hip Arthroplasty;  Surgeon: [name], MD;  Location: [facility];  Service: Orthopedics;  Laterality: Left;  Conversion of a Bipolar to a Left Total Hip Arthroplasty       Home Medications    Prior to Admission medications   Medication Sig Start Date End Date Taking? Authorizing Provider  azithromycin (ZITHROMAX Z-PAK) 250 MG tablet As directed 08/21/17  Yes Laurey Morale, MD  cyanocobalamin (,VITAMIN B-12,) 1000 MCG/ML injection Inject 1,000 mcg into the muscle every 30 (thirty) days.   Yes [provider]  cyclobenzaprine (FLEXERIL) 10 MG tablet TAKE 1 TABLET 3 TIMES DAILYAS NEEDED FOR MUSCLE SPASMS 08/08/16  Yes Laurey Morale, MD  Ferrous Sulfate 220 (44 Fe) MG/5ML LIQD Take 7 mLs by mouth daily. TAKE 7 MLS BY MOUTH DAILY 10/08/16  Yes Laurey Morale, MD  finasteride (PROSCAR) 5 MG tablet Take 1 tablet (5 mg total) by mouth daily. 07/18/17  Yes Laurey Morale, MD  flecainide (TAMBOCOR) 50 MG tablet Take 1 tablet (50 mg total) by mouth every  12 (twelve) hours. 07/18/17  Yes Camnitz, Will Hassell Done, MD  gabapentin (NEURONTIN) 400 MG capsule TAKE 1 CAPSULE 4 TIMES     DAILY 06/25/16  Yes Laurey Morale, MD  glipiZIDE (GLUCOTROL) 10 MG tablet Take 0.5 tablets (5 mg total) by mouth 2 (two) times daily. 10/08/16  Yes Laurey Morale, MD  HYDROcodone-acetaminophen (NORCO/VICODIN) 5-325 MG tablet Take 1 tablet by mouth every 6 (six) hours as needed for moderate pain. 08/02/17  Yes Laurey Morale, MD  lidocaine (LIDODERM) 5 % Place 1 patch onto the skin daily. Remove & Discard patch within 12 hours or as directed by MD 05/31/15  Yes Laurey Morale, MD  losartan (COZAAR) 50 MG tablet TAKE 1 TABLET EVERY MORNING 07/11/17  Yes Laurey Morale, MD  metFORMIN (GLUCOPHAGE) 1000 MG tablet TAKE 1 TABLET TWICE A DAY  WITH MEALS 08/08/16  Yes Laurey Morale, MD  metoprolol tartrate (LOPRESSOR) 25 MG tablet Take 1 tablet (25 mg total) by mouth 2 (two) times daily. 07/18/17  Yes Camnitz, Ocie Doyne, MD  NUTRITIONAL SUPPLEMENT LIQD Take 120 mLs by mouth 2 (two) times daily. SF Med[employer] for nutritional support   Yes [provider]  polyethylene glycol powder (GLYCOLAX/MIRALAX) powder Take 17 g by mouth daily. Patient taking differently: Take 17 g by mouth as needed.  01/18/17  Yes Laurey Morale, MD  rivaroxaban (XARELTO) 20 MG TABS tablet Take 1 tablet (20 mg total) by mouth daily with supper. 08/30/16  Yes Camnitz, Will Hassell Done, MD  sitaGLIPtin (JANUVIA) 100 MG tablet Take 1 tablet (100 mg total) by mouth daily. 11/29/16  Yes Laurey Morale, MD  glipiZIDE (GLUCOTROL) 10 MG tablet TAKE 1 TABLET TWICE A DAY  BEFORE MEALS Patient not taking: Reported on 08/23/2017 07/11/17   Laurey Morale, MD  Glucose Blood (TRUE METRIX BLOOD GLUCOSE TEST VI) by In Vitro route 2 (two) times daily. Diagnosis code is E 11.9    [provider]    Family History Family History  Problem Relation Age of Onset  . Tuberculosis Mother   . Heart failure Father     Social  History Social History   Tobacco Use  . Smoking status: Never Smoker  . Smokeless tobacco: Never Used  Substance Use Topics  . Alcohol use: No    Alcohol/week: 0.0 oz  . Drug use: No     Allergies   Penicillins and Ppd [tuberculin purified protein derivative]   Review of Systems Review of Systems  Respiratory: Positive for shortness of breath.   Psychiatric/Behavioral: Positive for confusion.  All other systems reviewed and are negative.    Physical Exam Updated Vital Signs BP (!) 150/67   Pulse 68   Temp 97.7 F (36.5 C) (Oral)   Resp 16   SpO2 98%   Physical Exam  Constitutional: He is oriented to person, place, and time.  Chronically ill   HENT:  Head: Normocephalic.  MM slightly dry   Eyes: Conjunctivae and EOM are normal. Pupils are equal, round, and reactive to light.  Neck: Normal range of motion. Neck supple.  Cardiovascular: Normal rate, regular rhythm and normal heart sounds.  Pulmonary/Chest:  Slightly tachypneic, crackles R base   Abdominal: Soft. Bowel sounds are normal. He exhibits no distension. There is no tenderness.  Musculoskeletal: Normal range of motion.  Neurological: He is alert and oriented to person, place, and time.  Skin: Skin is warm.  Psychiatric: He has a normal mood and affect.  Nursing note and vitals reviewed.    ED Treatments / Results  Labs (all labs ordered are listed, but only abnormal results are displayed) Labs Reviewed  COMPREHENSIVE METABOLIC PANEL - Abnormal; Notable for the following components:      Result Value   Sodium 129 (*)    Chloride 95 (*)    BUN 21 (*)    Calcium 8.5 (*)    Albumin 3.0 (*)    AST 58 (*)    Alkaline Phosphatase 212 (*)    All other components within normal limits  CBC WITH DIFFERENTIAL/PLATELET - Abnormal; Notable for the following components:   RBC 3.06 (*)    Hemoglobin 9.1 (*)    HCT 27.3 (*)    Monocytes Absolute 1.1 (*)    All other components within normal limits    I-STAT CG4 LACTIC ACID, ED - Abnormal; Notable for the following components:   Lactic Acid, Venous 2.82 (*)    All other components within normal limits  CULTURE, BLOOD (ROUTINE X 2)  CULTURE, BLOOD (ROUTINE X 2)  URINALYSIS, ROUTINE W REFLEX MICROSCOPIC  INFLUENZA PANEL BY PCR (TYPE A & B)  HEPATITIS PANEL, ACUTE  GAMMA GT  I-STAT CG4 LACTIC ACID, ED    EKG  EKG Interpretation None       Radiology Dg Chest 2 View  Result Date: 08/23/2017 CLINICAL DATA:  Pneumonia. EXAM: CHEST  2 VIEW COMPARISON:  Radiographs of August 21, 2017. FINDINGS: The heart size and mediastinal contours are within normal limits. Atherosclerosis of thoracic aorta is noted. Left-sided pacemaker is unchanged in position. No pneumothorax or pleural effusion is noted. Stable diffuse reticular densities are noted throughout both lungs consistent with chronic interstitial lung disease or scarring. Stable right basilar opacity is noted concerning for pneumonia. The visualized skeletal structures are unremarkable. IMPRESSION: Aortic atherosclerosis. Stable right basilar opacity is noted most consistent with pneumonia. Followup PA and lateral chest X-ray is recommended in 3-4 weeks following trial of antibiotic therapy to ensure resolution and exclude underlying malignancy. Electronically Signed   By: Marijo Conception, M.D.   On: 08/23/2017 16:33    Procedures Procedures (including critical care time)  Medications Ordered in ED Medications  sodium chloride 0.9 % bolus 1,000 mL (0 mLs Intravenous Stopped 08/23/17 1936)  levofloxacin (LEVAQUIN) IVPB 750 mg (0 mg Intravenous Stopped 08/23/17 1936)     Initial Impression / Assessment and Plan / ED Course  I have reviewed the triage vital signs and the nursing notes.  Pertinent labs & imaging results that were available during my care of the patient were reviewed by me and considered in my medical decision making (see chart for details).    MARCUS SCHWANDT is a 84 y.o.  male here with SOB, acute encephalopathy likely from pneumonia. No fevers at home but there is confusion. Will do sepsis workup with labs, lactate, cultures, CXR, UA. Will hydrate and likely need admission.   6:20 pm Lactate 2.8. WBC nl. CXR showed persistent pneumonia. He is on xarelto and is not hypoxic so I have low suspicion for PE. Na 129 likely from pneumonia vs dehydration. Given levaquin and NS bolus in the ED. Will admit for CAP.    Final Clinical Impressions(s) / ED Diagnoses   Final diagnoses:  Abnormal liver function    ED Discharge Orders    None       Drenda Freeze, MD 08/23/17 [phone]

## 2017-08-23 NOTE — ED Notes (Signed)
Per PCP-states patient has right sided PNA and is not responding well to outpatient treatment

## 2017-08-23 NOTE — ED Notes (Signed)
Bed: RX45 Expected date:  Expected time:  Means of arrival:  Comments: Zenia Resides

## 2017-08-23 NOTE — ED Notes (Signed)
ED TO INPATIENT HANDOFF REPORT  Name/Age/Gender Andrew Norton 84 y.o. male  Code Status Code Status History    Date Active Date Inactive Code Status Order ID Comments User Context   05/17/2016 18:28 05/30/2016 22:34 Full Code [id number]  Florencia Reasons, MD Inpatient   04/13/2015 00:44 04/16/2015 15:42 Full Code [id number]  Allyne Gee, MD Inpatient   11/05/2012 19:13 11/08/2012 16:53 Full Code [id number]  [name], MD Inpatient      Home/SNF/Other Home  Chief Complaint Pneumonia   Level of Care/Admitting Diagnosis ED Disposition    ED Disposition Condition [facility]: [facility] [[id number]]  Level of Care: Telemetry [5]  Admit to tele based on following criteria: Monitor for Ischemic changes  Diagnosis: CAP (community acquired pneumonia) [010932]  Admitting Physician: Jani Gravel [[id number]]  Attending Physician: Jani Gravel [phone]  Estimated length of stay: past midnight tomorrow  Certification:: I certify this patient will need inpatient services for at least 2 midnights  PT Class (Do Not Modify): Inpatient [101]  PT Acc Code (Do Not Modify): Private [1]       Medical History Past Medical History:  Diagnosis Date  . Anemia   . Arthritis    hips  . Atrial fibrillation ([place])   . Carpal tunnel syndrome    left leg sciatica  . Diabetes mellitus   . Hearing loss    left ear greater loss  . History of shingles    past hx. left hand  . Hyperlipidemia   . Hypertension    EKG, chest x ray 11/15/10 EPIC,  clearance Dr Sharlene Motts on chart/ pt states on meds to "prevent hypertension from diabetes"  . Paget's disease    scrotum  . Paroxysmal atrial fibrillation ([place]) 05/18/2016  . Positive PPD, treated     Allergies Allergies  Allergen Reactions  . Penicillins Hives    Has patient had a PCN reaction causing immediate rash, facial/tongue/throat swelling, SOB or lightheadedness with hypotension: Yes Has patient had a PCN reaction causing severe  rash involving mucus membranes or skin necrosis: No Has patient had a PCN reaction that required hospitalization: Yes Has patient had a PCN reaction occurring within the last 10 years:No If all of the above answers are "NO", then may proceed with Cephalosporin use.   Marland Kitchen Ppd [Tuberculin Purified Protein Derivative]     IV Location/Drains/Wounds Patient Lines/Drains/Airways Status   Active Line/Drains/Airways    Name:   Placement date:   Placement time:   Site:   Days:   Peripheral IV 08/23/17 Left;Upper Arm   08/23/17    1720    Arm   less than 1   External Urinary Catheter   05/27/16    -    -   453   Incision 11/07/11 Back Other (Comment)   11/07/11    1307     2116   Incision 11/05/12 Hip Left   11/05/12    1659     1752   Incision (Closed) 05/28/16 Chest Left   05/28/16    1716     452          Labs/Imaging Results for orders placed or performed during the hospital encounter of 08/23/17 (from the past 48 hour(s))  Comprehensive metabolic panel     Status: Abnormal   Collection Time: 08/23/17  4:07 PM  Result Value Ref Range   Sodium 129 (L) 135 - 145 mmol/L   Potassium 4.2 3.5 -  5.1 mmol/L   Chloride 95 (L) 101 - 111 mmol/L   CO2 22 22 - 32 mmol/L   Glucose, Bld 77 65 - 99 mg/dL   BUN 21 (H) 6 - 20 mg/dL   Creatinine, Ser 0.91 0.61 - 1.24 mg/dL   Calcium 8.5 (L) 8.9 - 10.3 mg/dL   Total Protein 7.6 6.5 - 8.1 g/dL   Albumin 3.0 (L) 3.5 - 5.0 g/dL   AST 58 (H) 15 - 41 U/L   ALT 48 17 - 63 U/L   Alkaline Phosphatase 212 (H) 38 - 126 U/L   Total Bilirubin 0.4 0.3 - 1.2 mg/dL   GFR calc non Af Amer >60 >60 mL/min   GFR calc Af Amer >60 >60 mL/min    Comment: (NOTE) The eGFR has been calculated using the CKD EPI equation. This calculation has not been validated in all clinical situations. eGFR's persistently <60 mL/min signify possible Chronic Kidney Disease.    Anion gap 12 5 - 15    Comment: Performed at [facility], [place] [address],  [place], [place] [address]  CBC with Differential     Status: Abnormal   Collection Time: 08/23/17  4:07 PM  Result Value Ref Range   WBC 10.0 4.0 - 10.5 K/uL   RBC 3.06 (L) 4.22 - 5.81 MIL/uL   Hemoglobin 9.1 (L) 13.0 - 17.0 g/dL   HCT 27.3 (L) 39.0 - 52.0 %   MCV 89.2 78.0 - 100.0 fL   MCH 29.7 26.0 - 34.0 pg   MCHC 33.3 30.0 - 36.0 g/dL   RDW 13.5 11.5 - 15.5 %   Platelets 343 150 - 400 K/uL   Neutrophils Relative % 77 %   Neutro Abs 7.6 1.7 - 7.7 K/uL   Lymphocytes Relative 11 %   Lymphs Abs 1.1 0.7 - 4.0 K/uL   Monocytes Relative 11 %   Monocytes Absolute 1.1 (H) 0.1 - 1.0 K/uL   Eosinophils Relative 1 %   Eosinophils Absolute 0.1 0.0 - 0.7 K/uL   Basophils Relative 0 %   Basophils Absolute 0.0 0.0 - 0.1 K/uL    Comment: Performed at [facility], [place] [address] [place], [place] [address]  Urinalysis, Routine w reflex microscopic     Status: None   Collection Time: 08/23/17  4:14 PM  Result Value Ref Range   Color, Urine YELLOW YELLOW   APPearance CLEAR CLEAR   Specific Gravity, Urine 1.015 1.005 - 1.030   pH 5.0 5.0 - 8.0   Glucose, UA NEGATIVE NEGATIVE mg/dL   Hgb urine dipstick NEGATIVE NEGATIVE   Bilirubin Urine NEGATIVE NEGATIVE   Ketones, ur NEGATIVE NEGATIVE mg/dL   Protein, ur NEGATIVE NEGATIVE mg/dL   Nitrite NEGATIVE NEGATIVE   Leukocytes, UA NEGATIVE NEGATIVE    Comment: Performed at [facility], [place] [address], [place], [place] [address]  I-Stat CG4 Lactic Acid, ED     Status: Abnormal   Collection Time: 08/23/17  4:25 PM  Result Value Ref Range   Lactic Acid, Venous 2.82 (HH) 0.5 - 1.9 mmol/L   Comment NOTIFIED PHYSICIAN   Influenza panel by PCR (type A & B)     Status: None   Collection Time: 08/23/17  5:13 PM  Result Value Ref Range   Influenza A By PCR NEGATIVE NEGATIVE   Influenza B By PCR NEGATIVE NEGATIVE    Comment: (NOTE) The Xpert Xpress Flu assay is intended as an aid in the diagnosis of  influenza and  should not be used as a sole basis for treatment.  This  assay is FDA approved for nasopharyngeal swab specimens only. Nasal  washings and aspirates are unacceptable for Xpert Xpress Flu testing. Performed at [facility], [place] [address], [place], [place] [address]    Dg Chest 2 View  Result Date: 08/23/2017 CLINICAL DATA:  Pneumonia. EXAM: CHEST  2 VIEW COMPARISON:  Radiographs of August 21, 2017. FINDINGS: The heart size and mediastinal contours are within normal limits. Atherosclerosis of thoracic aorta is noted. Left-sided pacemaker is unchanged in position. No pneumothorax or pleural effusion is noted. Stable diffuse reticular densities are noted throughout both lungs consistent with chronic interstitial lung disease or scarring. Stable right basilar opacity is noted concerning for pneumonia. The visualized skeletal structures are unremarkable. IMPRESSION: Aortic atherosclerosis. Stable right basilar opacity is noted most consistent with pneumonia. Followup PA and lateral chest X-ray is recommended in 3-4 weeks following trial of antibiotic therapy to ensure resolution and exclude underlying malignancy. Electronically Signed   By: Marijo Conception, M.D.   On: 08/23/2017 16:33    Pending Labs Unresulted Labs (From admission, onward)   Start     Ordered   08/24/17 0500  Hepatitis panel, acute  Tomorrow morning,   R     08/23/17 1926   08/24/17 0500  Gamma GT  Tomorrow morning,   R     08/23/17 1926   08/23/17 1655  Blood culture (routine x 2)  BLOOD CULTURE X 2,   STAT     08/23/17 1655   Signed and Held  HIV antibody  Once,   R     Signed and Held   Signed and Held  Culture, blood (routine x 2) Call MD if unable to obtain prior to antibiotics being given  BLOOD CULTURE X 2,   R    Comments:  If blood cultures drawn in Emergency Department - Do not draw and cancel order   Question:  Patient immune status  Answer:  Normal   Signed and Held   Signed and Held  Culture,  sputum-assessment  Once,   R    Question:  Patient immune status  Answer:  Normal   Signed and Held   Signed and Held  Gram stain  Once,   R    Question:  Patient immune status  Answer:  Normal   Signed and Held   Signed and Held  Strep pneumoniae urinary antigen  Once,   R     Signed and Held   Visual merchandiser and Held  CBC  Once,   R     Signed and Held   Signed and Held  Legionella Pneumophila Serogp 1 Ur Ag  Once,   R     Signed and Held      Vitals/Pain Today's Vitals   08/23/17 1551 08/23/17 1659 08/23/17 1740 08/23/17 1937  BP:  (!) 149/71 (!) 144/87 (!) 150/67  Pulse:  72 61 68  Resp:  _0 Temp:    97.7 F (36.5 C)  TempSrc:    Oral  SpO2:  93% 98% 98%  PainSc: 3    2     Isolation Precautions No active isolations  Medications Medications  sodium chloride 0.9 % bolus 1,000 mL (0 mLs Intravenous Stopped 08/23/17 1936)  levofloxacin (LEVAQUIN) IVPB 750 mg (0 mg Intravenous Stopped 08/23/17 1936)    Mobility walks with device

## 2017-08-23 NOTE — ED Notes (Signed)
ED Provider at bedside. 

## 2017-08-23 NOTE — ED Triage Notes (Signed)
Pt BIB wife from home. His PCP dx'd him with pneumonia and is requesting that he get IV antibiotics. Pt denies chest pain. A&Ox4. Ambulatory with his walker.

## 2017-08-23 NOTE — Progress Notes (Signed)
   Subjective:    Patient ID: Andrew Norton, male    DOB: [birth year], 84 y.o.   MRN: [id number]  HPI Here to follow up. He was seen here 2 days ago for a cough, weakness, and some mental status changes. A CXR demonstrated pneumonia in the right middle and right lower lobes. He has been taking a Zpack. Today he is really no better than before. He still has a cough and some SOB. No chest pain. He is very weak however and even needed help last night getting up from the toilet. Appetite is poor but he is drinking fluids.    Review of Systems  HENT: Negative.   Eyes: Negative.   Respiratory: Positive for cough and shortness of breath. Negative for wheezing.   Cardiovascular: Negative.   Neurological: Positive for weakness. Negative for speech difficulty.  Psychiatric/Behavioral: Positive for confusion.       Objective:   Physical Exam  Constitutional: He is oriented to person, place, and time.  Frail and weak, using a walker   Neck: No thyromegaly present.  Cardiovascular: Normal rate, regular rhythm, normal heart sounds and intact distal pulses.  Pulmonary/Chest: Effort normal. No respiratory distress. He has no wheezes.  Scattered rhonchi. There are rales in the right posterior base   Lymphadenopathy:    He has no cervical adenopathy.  Neurological: He is alert and oriented to person, place, and time.          Assessment & Plan:  Right sided CAP. He needs to be admitted for IV fluids and antibiotics. His wife will drive him directly to [facility] ER now.  Alysia Penna, MD

## 2017-08-24 ENCOUNTER — Inpatient Hospital Stay (HOSPITAL_COMMUNITY): Payer: Medicare Other

## 2017-08-24 DIAGNOSIS — E118 Type 2 diabetes mellitus with unspecified complications: Secondary | ICD-10-CM

## 2017-08-24 LAB — RESPIRATORY PANEL BY PCR
Adenovirus: NOT DETECTED
Bordetella pertussis: NOT DETECTED
CHLAMYDOPHILA PNEUMONIAE-RVPPCR: NOT DETECTED
CORONAVIRUS HKU1-RVPPCR: NOT DETECTED
CORONAVIRUS NL63-RVPPCR: NOT DETECTED
Coronavirus 229E: NOT DETECTED
Coronavirus OC43: NOT DETECTED
INFLUENZA A H1 2009-RVPPR: NOT DETECTED
INFLUENZA A H3-RVPPCR: NOT DETECTED
INFLUENZA B-RVPPCR: NOT DETECTED
Influenza A H1: NOT DETECTED
Influenza A: NOT DETECTED
Metapneumovirus: NOT DETECTED
Mycoplasma pneumoniae: NOT DETECTED
PARAINFLUENZA VIRUS 3-RVPPCR: NOT DETECTED
Parainfluenza Virus 1: NOT DETECTED
Parainfluenza Virus 2: NOT DETECTED
Parainfluenza Virus 4: NOT DETECTED
RHINOVIRUS / ENTEROVIRUS - RVPPCR: NOT DETECTED
Respiratory Syncytial Virus: NOT DETECTED

## 2017-08-24 LAB — CBC
HEMATOCRIT: 24 % — AB (ref 39.0–52.0)
Hemoglobin: 8.1 g/dL — ABNORMAL LOW (ref 13.0–17.0)
MCH: 30.2 pg (ref 26.0–34.0)
MCHC: 33.8 g/dL (ref 30.0–36.0)
MCV: 89.6 fL (ref 78.0–100.0)
Platelets: 310 10*3/uL (ref 150–400)
RBC: 2.68 MIL/uL — AB (ref 4.22–5.81)
RDW: 13.6 % (ref 11.5–15.5)
WBC: 7.8 10*3/uL (ref 4.0–10.5)

## 2017-08-24 LAB — GLUCOSE, CAPILLARY
GLUCOSE-CAPILLARY: 84 mg/dL (ref 65–99)
GLUCOSE-CAPILLARY: 191 mg/dL — AB (ref 65–99)
GLUCOSE-CAPILLARY: 190 mg/dL — AB (ref 65–99)
Glucose-Capillary: 170 mg/dL — ABNORMAL HIGH (ref 65–99)

## 2017-08-24 LAB — HIV ANTIBODY (ROUTINE TESTING W REFLEX): HIV Screen 4th Generation wRfx: NONREACTIVE

## 2017-08-24 LAB — GAMMA GT: GGT: 107 U/L — ABNORMAL HIGH (ref 7–50)

## 2017-08-24 LAB — STREP PNEUMONIAE URINARY ANTIGEN: Strep Pneumo Urinary Antigen: NEGATIVE

## 2017-08-24 MED ORDER — TAMSULOSIN HCL 0.4 MG PO CAPS
0.4000 mg | ORAL_CAPSULE | Freq: Every day | ORAL | Status: DC
  Administered 2017-08-24 – 2017-08-27 (×4): 0.4 mg via ORAL
  Filled 2017-08-24 (×4): qty 1

## 2017-08-24 MED ORDER — GUAIFENESIN 100 MG/5ML PO SOLN
5.0000 mL | ORAL | Status: DC | PRN
  Administered 2017-08-24 – 2017-08-27 (×2): 100 mg via ORAL
  Filled 2017-08-24 (×3): qty 10

## 2017-08-24 MED ORDER — DEXTROSE 5 % IV SOLN
500.0000 mg | INTRAVENOUS | Status: DC
  Administered 2017-08-24 – 2017-08-25 (×2): 500 mg via INTRAVENOUS
  Filled 2017-08-24 (×2): qty 500

## 2017-08-24 MED ORDER — GUAIFENESIN ER 600 MG PO TB12: 1200.0000 mg | ORAL_TABLET | Freq: Two times a day (BID) | ORAL | Status: DC | PRN

## 2017-08-24 MED ORDER — IPRATROPIUM-ALBUTEROL 0.5-2.5 (3) MG/3ML IN SOLN: 3.0000 mL | RESPIRATORY_TRACT | Status: DC | PRN

## 2017-08-24 MED ORDER — DEXTROSE 5 % IV SOLN
1.0000 g | INTRAVENOUS | Status: DC
  Administered 2017-08-24 – 2017-08-25 (×2): 1 g via INTRAVENOUS
  Filled 2017-08-24 (×2): qty 10

## 2017-08-24 NOTE — Progress Notes (Signed)
Pharmacy: ceftriaxone  Patient's an 84 y.o M presented to the ED on 08/23/17 with PNA.  To start ceftriaxone for infection. Of note, patient has PCN listed as an allergy but he received ceftriaxone in 2016.  Plan: -  Ceftriaxone 1 gm IV q24h - No renal adjustment is needed with Ceftriaxone -- pharmacy will sign off - Re-consult [place] if need further assistance  Thank you for asking pharmacy to be part of this patient's care.  Dia Sitter, PharmD, BCPS 08/24/2017 11:33 AM

## 2017-08-24 NOTE — Progress Notes (Signed)
Respiratory panel NEGATIVE NEGATIVE Flu A Flu B  Discontinue Resp droplet precautions

## 2017-08-24 NOTE — Progress Notes (Signed)
PROGRESS NOTE    Andrew Norton  XBJ:[id number] DOB: 1927-05-23 DOA: 08/23/2017 PCP: Laurey Morale, MD   Brief Narrative:  84 year old male with a history of hypertension, hyperlipidemia, paroxysmal atrial fibrillation, diabetes type 2 came to the hospital after being sent by his PCP for treatment of pneumonia.  Patient failed outpatient treatment of pneumonia and the chest x-ray showed right middle and lower lobe infiltrate.   Assessment & Plan:   Principal Problem:   CAP (community acquired pneumonia) Active Problems:   Anemia   Diabetes mellitus type 2 with complications (HCC)   Hyponatremia   Abnormal liver function  Right lower lobe community-acquired pneumonia Acute respiratory distress, improved -Supplemental oxygen, nebulizer treatments started -We will give antitussives as necessary, follow-up on cultures - Levaquin started at the time of admission, will switch this over to IV Rocephin and azithromycin.  He is allergic to penicillin which appears to be mostly hives, discussed with the pharmacist and will give Rocephin a try while he is in the hospital.  If he does well we can discharge him on Vantin (+/- Doxyc/Azithromycin if needed) -Continue to provide supportive care -Urine strep-negative -Respiratory panel pending -Influenza negative, done at the time of admission. -Urine Legionella-  Acute metabolic encephalopathy, dementia and delirium - Likely secondary to underlying infection along with being at unfamiliar environment -Avoid medications to sedate him, use redirection method -Patient is calm at this time, will use sitter if necessary.  Family is at bedside  Urinary urgency and intermittent incontinence BPH -Concerns for BPH.  He is already on finasteride.  We will add Flomax -UA is negative at the time of admission  Mild dehydration - Received 1 L of normal saline overnight, will encourage oral intake this morning.  If remains poor then will restart IV  fluids  Transaminitis -Right upper quadrant ultrasound shows slightly contracted gallbladder, borderline wall thickness but negative for stone.  Enlarged CBD at 9.8 mm which has been decreased in diameter since 2017 from 13 mm. -We will continue to trend LFTs for now. -Acute hepatitis panel is pending  Anemia -Unknown etiology, likely from chronic disease - Hemoglobin stable, continue ferrous sulfate  Diabetes mellitus type 2 -Continue home medications.  Accu-Chek and sliding scale  Essential hypertension Paroxysmal atrial fibrillation -Continue home medications along with Xarelto   We will get physical therapy and Occupational Therapy to see him   DVT prophylaxis: Xarelto Code Status: Full code Family Communication: Both sons at bedside Disposition Plan: Maintain inpatient stay  Consultants:   None  Procedures:   None  Antimicrobials:   Levaquin 2/8 > 2/9  Rocephine 2/9  Azithromycin 2/9   Subjective: No complaints.  Appetite is somewhat poor due to dietary restrictions, I have told the patient I will place him on a regular diet.  Does not have any complaints.  Sons tell me patient lives at home with his wife in a two-story home.  They do have concerns about patient's ambulation at home especially going up and down the house.  Apparently patient has fallen several times at home but does not complain about this.  Objective: Vitals:   08/23/17 1740 08/23/17 1937 08/23/17 2049 08/24/17 0515  BP: (!) 144/87 (!) 150/67 (!) 169/68 (!) 140/54  Pulse: 61 68 60 65  Resp: 18 16 20 20   Temp:  97.7 F (36.5 C) 98 F (36.7 C) 98.5 F (36.9 C)  TempSrc:  Oral Oral Oral  SpO2: 98% 98% 96% 99%  Weight:  71.4 kg (  157 lb 6.4 oz)    Height:  5\' 10"  (1.778 m)      Intake/Output Summary (Last 24 hours) at 08/24/2017 1107 Last data filed at 08/23/2017 1936 Gross per 24 hour  Intake 1150 ml  Output -  Net 1150 ml   Filed Weights   08/23/17 1937  Weight: 71.4 kg (157 lb  6.4 oz)    Examination:  General exam: Appears calm and comfortable, elderly frail-appearing Respiratory system: Diminished breath sounds at the bases especially on the right side Cardiovascular system: S1 & S2 heard, RRR. No JVD, murmurs, rubs, gallops or clicks. No pedal edema. Gastrointestinal system: Abdomen is nondistended, soft and nontender. No organomegaly or masses felt. Normal bowel sounds heard. Central nervous system: Alert and oriented. No focal neurological deficits. Extremities: Symmetric 5 x 5 power. Skin: No rashes, lesions or ulcers Psychiatry:  Mood & affect appropriate.     Data Reviewed:   CBC: Recent Labs  Lab 08/21/17 1342 08/23/17 1607 08/24/17 0646  WBC 13.7* 10.0 7.8  NEUTROABS 12.0* 7.6  --   HGB 9.7* 9.1* 8.1*  HCT 29.1* 27.3* 24.0*  MCV 91.3 89.2 89.6  PLT 326.0 343 734   Basic Metabolic Panel: Recent Labs  Lab 08/21/17 1342 08/23/17 1607  NA 130* 129*  K 4.5 4.2  CL 96 95*  CO2 23 22  GLUCOSE 196* 77  BUN 27* 21*  CREATININE 0.97 0.91  CALCIUM 8.4 8.5*   GFR: Estimated Creatinine Clearance: 56.7 mL/min (by C-G formula based on SCr of 0.91 mg/dL). Liver Function Tests: Recent Labs  Lab 08/21/17 1342 08/23/17 1607  AST 36 58*  ALT 32 48  ALKPHOS 162* 212*  BILITOT 1.0 0.4  PROT 6.8 7.6  ALBUMIN 3.2* 3.0*   No results for input(s): LIPASE, AMYLASE in the last 168 hours. No results for input(s): AMMONIA in the last 168 hours. Coagulation Profile: No results for input(s): INR, PROTIME in the last 168 hours. Cardiac Enzymes: No results for input(s): CKTOTAL, CKMB, CKMBINDEX, TROPONINI in the last 168 hours. BNP (last 3 results) No results for input(s): PROBNP in the last 8760 hours. HbA1C: No results for input(s): HGBA1C in the last 72 hours. CBG: Recent Labs  Lab 08/23/17 2048 08/23/17 2133 08/23/17 2144 08/23/17 2258 08/24/17 0729  GLUCAP 57* 49* 87 143* 84   Lipid Profile: No results for input(s): CHOL, HDL,  LDLCALC, TRIG, CHOLHDL, LDLDIRECT in the last 72 hours. Thyroid Function Tests: Recent Labs    08/21/17 1342  TSH 2.66   Anemia Panel: Recent Labs    08/21/17 1342  VITAMINB12 940*   Sepsis Labs: Recent Labs  Lab 08/23/17 1625  LATICACIDVEN 2.82*    Recent Results (from the past 240 hour(s))  Blood culture (routine x 2)     Status: None (Preliminary result)   Collection Time: 08/23/17  5:13 PM  Result Value Ref Range Status   Specimen Description   Final    BLOOD BLOOD LEFT HAND Performed at [facility] [address], [place], [place] [address]    Special Requests   Final    IN PEDIATRIC BOTTLE Blood Culture adequate volume Performed at [facility] [address], [place], [place] [address]    Culture   Final    NO GROWTH < 24 HOURS Performed at [facility] [address] [place], [place] [address]    Report Status PENDING  Incomplete  Blood culture (routine x 2)  Status: None (Preliminary result)   Collection Time: 08/23/17  5:16 PM  Result Value Ref Range Status   Specimen Description   Final    BLOOD LEFT ANTECUBITAL Performed at [facility] [address], [place], [place] [address]    Special Requests   Final    BOTTLES DRAWN AEROBIC AND ANAEROBIC Blood Culture adequate volume Performed at [facility] [address], [place], [place] [address]    Culture   Final    NO GROWTH < 24 HOURS Performed at [facility] [address] [place], [place] [address]    Report Status PENDING  Incomplete  Culture, sputum-assessment     Status: None   Collection Time: 08/23/17 10:54 PM  Result Value Ref Range Status   Specimen Description EXPECTORATED SPUTUM  Final   Special Requests Normal  Final   Sputum evaluation   Final    Sputum specimen not acceptable for testing.  Please recollect.   RESULT CALLED TO, READ BACK BY AND VERIFIED WITH: Erle Crocker  [id number] @ 9323 BY J SCOTTON Performed at [facility], [place] [address] [place], [place] [address]    Report Status 08/23/2017 FINAL  Final         Radiology Studies: Dg Chest 2 View  Result Date: 08/23/2017 CLINICAL DATA:  Pneumonia. EXAM: CHEST  2 VIEW COMPARISON:  Radiographs of August 21, 2017. FINDINGS: The heart size and mediastinal contours are within normal limits. Atherosclerosis of thoracic aorta is noted. Left-sided pacemaker is unchanged in position. No pneumothorax or pleural effusion is noted. Stable diffuse reticular densities are noted throughout both lungs consistent with chronic interstitial lung disease or scarring. Stable right basilar opacity is noted concerning for pneumonia. The visualized skeletal structures are unremarkable. IMPRESSION: Aortic atherosclerosis. Stable right basilar opacity is noted most consistent with pneumonia. Followup PA and lateral chest X-ray is recommended in 3-4 weeks following trial of antibiotic therapy to ensure resolution and exclude underlying malignancy. Electronically Signed   By: Marijo Conception, M.D.   On: 08/23/2017 16:33   US Abdomen Limited Ruq  Result Date: 08/24/2017 CLINICAL DATA:  Abnormal liver function EXAM: ULTRASOUND ABDOMEN LIMITED RIGHT UPPER QUADRANT COMPARISON:  Ultrasound 05/23/2016 FINDINGS: Gallbladder: Slightly contracted. No shadowing stones. Borderline wall thickening at 3.2 mm. Negative sonographic Murphy. Common bile duct: Diameter: Enlarged measuring up to 9.8 mm, but decreased compared to previous ultrasound at which time the bile duct measured 13 mm. Liver: No focal lesion identified. Within normal limits in parenchymal echogenicity. Portal vein is patent on color Doppler imaging with normal direction of blood flow towards the liver. IMPRESSION: 1. Slightly contracted gallbladder. Borderline wall thickness which may be partially related to contraction. Negative for stone disease 2. Enlarged common  bile duct measuring up to 9.8 mm. This is decreased compared to the 2017 ultrasound at which time bile duct measured 13 mm. Electronically Signed   By: Donavan Foil M.D.   On: 08/24/2017 02:08        Scheduled Meds: . feeding supplement (ENSURE ENLIVE)  120 mL Oral BID BM  . ferrous sulfate  300 mg Oral Daily  . finasteride  5 mg Oral Daily  . flecainide  50 mg Oral Q12H  . gabapentin  400 mg Oral QID  . insulin aspart  0-5 Units Subcutaneous QHS  . insulin aspart  0-9 Units Subcutaneous TID WC  . linagliptin  5 mg Oral Daily  . losartan  50 mg  Oral q morning - 10a  . metFORMIN  1,000 mg Oral BID WC  . metoprolol tartrate  25 mg Oral BID  . rivaroxaban  20 mg Oral Q supper   Continuous Infusions: . levofloxacin (LEVAQUIN) IV       LOS: 1 day    Time spent: 35 mins    Ankit Arsenio Loader, MD [facility] Pager [phone]   If 7PM-7AM, please contact night-coverage [email or web address] Password [email or web address] 08/24/2017, 11:07 AM

## 2017-08-25 DIAGNOSIS — R531 Weakness: Secondary | ICD-10-CM

## 2017-08-25 LAB — GLUCOSE, CAPILLARY
GLUCOSE-CAPILLARY: 151 mg/dL — AB (ref 65–99)
Glucose-Capillary: 157 mg/dL — ABNORMAL HIGH (ref 65–99)
Glucose-Capillary: 214 mg/dL — ABNORMAL HIGH (ref 65–99)
Glucose-Capillary: 175 mg/dL — ABNORMAL HIGH (ref 65–99)

## 2017-08-25 LAB — HEPATITIS PANEL, ACUTE
HCV AB: 0.2 {s_co_ratio} (ref 0.0–0.9)
HEP A IGM: NEGATIVE
HEP B C IGM: NEGATIVE
HEP B S AG: NEGATIVE

## 2017-08-25 MED ORDER — IPRATROPIUM-ALBUTEROL 0.5-2.5 (3) MG/3ML IN SOLN
3.0000 mL | Freq: Two times a day (BID) | RESPIRATORY_TRACT | Status: DC
  Administered 2017-08-25 – 2017-08-27 (×4): 3 mL via RESPIRATORY_TRACT
  Filled 2017-08-25 (×6): qty 3

## 2017-08-25 MED ORDER — IPRATROPIUM-ALBUTEROL 0.5-2.5 (3) MG/3ML IN SOLN
3.0000 mL | Freq: Four times a day (QID) | RESPIRATORY_TRACT | Status: DC
  Administered 2017-08-25: 3 mL via RESPIRATORY_TRACT
  Filled 2017-08-25: qty 3

## 2017-08-25 NOTE — Progress Notes (Addendum)
PROGRESS NOTE    Andrew Norton  HUD:[id number] DOB: 1926-12-21 DOA: 08/23/2017 PCP: Laurey Morale, MD   Brief Narrative:  84 year old male with a history of hypertension, hyperlipidemia, paroxysmal atrial fibrillation, diabetes type 2 came to the hospital after being sent by his PCP for treatment of pneumonia.  Patient failed outpatient treatment of pneumonia and the chest x-ray showed right middle and lower lobe infiltrate.  Physical evaluation has been ordered   Assessment & Plan:   Principal Problem:   CAP (community acquired pneumonia) Active Problems:   Anemia   Diabetes mellitus type 2 with complications (HCC)   Hyponatremia   Abnormal liver function  Right lower lobe community-acquired pneumonia Acute respiratory distress, improved -Supplemental oxygen, nebulizer treatments started -We will give antitussives as necessary, follow-up on cultures - Levaquin started at the time of admission, switched over to IV Rocephin and azithromycin.  He is allergic to penicillin which appears to be mostly hives, discussed with the pharmacist and will give Rocephin a try while he is in the hospital.  If he does well we can discharge him on Vantin (+/- Doxyc/Azithromycin if needed) -Continue to provide supportive care -Urine strep-negative -Respiratory panel-negative -Influenza negative, done at the time of admission. -Urine Legionella- still pending - Nebulizer treatments every 6 hours, and then as needed -Supplemental oxygen as needed  Acute metabolic encephalopathy, dementia and delirium -This is intermittent.  This morning he appears more so close to his baseline.  Generalized weakness -Physical therapy ordered  Urinary urgency and intermittent incontinence BPH -Concerns for BPH.  He is already on finasteride.  Added Flomax -UA is negative at the time of admission  Transaminitis -Right upper quadrant ultrasound shows slightly contracted gallbladder, borderline wall thickness but  negative for stone.  Enlarged CBD at 9.8 mm which has been decreased in diameter since 2017 from 13 mm. -We will continue to trend LFTs for now. -Acute hepatitis panel is pending  Anemia -Unknown etiology, likely from chronic disease - Hemoglobin stable, continue ferrous sulfate  Diabetes mellitus type 2 -Continue home medications.  Accu-Chek and sliding scale  Essential hypertension Paroxysmal atrial fibrillation -Continue home medications along with Xarelto   PT eval pending   DVT prophylaxis: Xarelto Code Status: Full code Family Communication: Both sons at bedside Disposition Plan: He still requires inpatient stay for more IV Abx, and breathing treatments. He isnt ready to be discharged yet.   Consultants:   None  Procedures:   None  Antimicrobials:   Levaquin 2/8 > 2/9  Rocephine 2/9  Azithromycin 2/9   Subjective: No complaints this morning.  Nurse was able to get him out of the bed to the chair.  But he does have some exertional shortness of breath.  No other acute events overnight.  Objective: Vitals:   08/24/17 0515 08/24/17 1359 08/24/17 2114 08/25/17 0538  BP: (!) 140/54 (!) 161/61 (!) 130/55 (!) 131/55  Pulse: 65 69 69 67  Resp: 20 18 19 19   Temp: 98.5 F (36.9 C) 98.1 F (36.7 C) 97.8 F (36.6 C) 97.9 F (36.6 C)  TempSrc: Oral Oral Oral Oral  SpO2: 99% 100% 96% 95%  Weight:      Height:        Intake/Output Summary (Last 24 hours) at 08/25/2017 1036 Last data filed at 08/25/2017 0954 Gross per 24 hour  Intake 720 ml  Output 150 ml  Net 570 ml   Filed Weights   08/23/17 1937  Weight: 71.4 kg (157 lb 6.4 oz)  Examination:  General exam: Appears calm and comfortable, elderly frail-appearing Respiratory system: Some bibasilar crackles more on the right Cardiovascular system: S1 & S2 heard, RRR. No JVD, murmurs, rubs, gallops or clicks. No pedal edema. Gastrointestinal system: Abdomen is nondistended, soft and nontender. No  organomegaly or masses felt. Normal bowel sounds heard. Central nervous system: Alert and oriented x2 . No focal neurological deficits. Extremities: Symmetric 5 x 5 power. Skin: No rashes, lesions or ulcers Psychiatry:  Mood & affect appropriate.     Data Reviewed:   CBC: Recent Labs  Lab 08/21/17 1342 08/23/17 1607 08/24/17 0646  WBC 13.7* 10.0 7.8  NEUTROABS 12.0* 7.6  --   HGB 9.7* 9.1* 8.1*  HCT 29.1* 27.3* 24.0*  MCV 91.3 89.2 89.6  PLT 326.0 343 301   Basic Metabolic Panel: Recent Labs  Lab 08/21/17 1342 08/23/17 1607  NA 130* 129*  K 4.5 4.2  CL 96 95*  CO2 23 22  GLUCOSE 196* 77  BUN 27* 21*  CREATININE 0.97 0.91  CALCIUM 8.4 8.5*   GFR: Estimated Creatinine Clearance: 56.7 mL/min (by C-G formula based on SCr of 0.91 mg/dL). Liver Function Tests: Recent Labs  Lab 08/21/17 1342 08/23/17 1607  AST 36 58*  ALT 32 48  ALKPHOS 162* 212*  BILITOT 1.0 0.4  PROT 6.8 7.6  ALBUMIN 3.2* 3.0*   No results for input(s): LIPASE, AMYLASE in the last 168 hours. No results for input(s): AMMONIA in the last 168 hours. Coagulation Profile: No results for input(s): INR, PROTIME in the last 168 hours. Cardiac Enzymes: No results for input(s): CKTOTAL, CKMB, CKMBINDEX, TROPONINI in the last 168 hours. BNP (last 3 results) No results for input(s): PROBNP in the last 8760 hours. HbA1C: No results for input(s): HGBA1C in the last 72 hours. CBG: Recent Labs  Lab 08/24/17 0729 08/24/17 1122 08/24/17 1707 08/24/17 2113 08/25/17 0724  GLUCAP 84 191* 190* 170* 157*   Lipid Profile: No results for input(s): CHOL, HDL, LDLCALC, TRIG, CHOLHDL, LDLDIRECT in the last 72 hours. Thyroid Function Tests: No results for input(s): TSH, T4TOTAL, FREET4, T3FREE, THYROIDAB in the last 72 hours. Anemia Panel: No results for input(s): VITAMINB12, FOLATE, FERRITIN, TIBC, IRON, RETICCTPCT in the last 72 hours. Sepsis Labs: Recent Labs  Lab 08/23/17 1625  LATICACIDVEN 2.82*     Recent Results (from the past 240 hour(s))  Blood culture (routine x 2)     Status: None (Preliminary result)   Collection Time: 08/23/17  5:13 PM  Result Value Ref Range Status   Specimen Description   Final    BLOOD BLOOD LEFT HAND Performed at [facility], [place] [address], [place], [place] [address]    Special Requests   Final    IN PEDIATRIC BOTTLE Blood Culture adequate volume Performed at [facility] [address], [place], [place] [address]    Culture   Final    NO GROWTH 2 DAYS Performed at [facility] [address] [place], [place] [address]    Report Status PENDING  Incomplete  Blood culture (routine x 2)     Status: None (Preliminary result)   Collection Time: 08/23/17  5:16 PM  Result Value Ref Range Status   Specimen Description   Final    BLOOD LEFT ANTECUBITAL Performed at [facility] [address], [place], [place] [address]    Special Requests   Final    BOTTLES DRAWN AEROBIC AND ANAEROBIC Blood Culture adequate volume  Performed at [facility], [place] [address] [place], [place] [address]    Culture   Final    NO GROWTH 2 DAYS Performed at [facility] [address], [place], [place] [address]    Report Status PENDING  Incomplete  Respiratory Panel by PCR     Status: None   Collection Time: 08/23/17  9:39 PM  Result Value Ref Range Status   Adenovirus NOT DETECTED NOT DETECTED Final   Coronavirus 229E NOT DETECTED NOT DETECTED Final   Coronavirus HKU1 NOT DETECTED NOT DETECTED Final   Coronavirus NL63 NOT DETECTED NOT DETECTED Final   Coronavirus OC43 NOT DETECTED NOT DETECTED Final   Metapneumovirus NOT DETECTED NOT DETECTED Final   Rhinovirus / Enterovirus NOT DETECTED NOT DETECTED Final   Influenza A NOT DETECTED NOT DETECTED Final   Influenza A H1 NOT DETECTED NOT DETECTED Final   Influenza A H1 2009 NOT DETECTED NOT DETECTED Final   Influenza A  H3 NOT DETECTED NOT DETECTED Final   Influenza B NOT DETECTED NOT DETECTED Final   Parainfluenza Virus 1 NOT DETECTED NOT DETECTED Final   Parainfluenza Virus 2 NOT DETECTED NOT DETECTED Final   Parainfluenza Virus 3 NOT DETECTED NOT DETECTED Final   Parainfluenza Virus 4 NOT DETECTED NOT DETECTED Final   Respiratory Syncytial Virus NOT DETECTED NOT DETECTED Final   Bordetella pertussis NOT DETECTED NOT DETECTED Final   Chlamydophila pneumoniae NOT DETECTED NOT DETECTED Final   Mycoplasma pneumoniae NOT DETECTED NOT DETECTED Final    Comment: Performed at [facility], [place] [address] [place], [place] [address]  Culture, sputum-assessment     Status: None   Collection Time: 08/23/17 10:54 PM  Result Value Ref Range Status   Specimen Description EXPECTORATED SPUTUM  Final   Special Requests Normal  Final   Sputum evaluation   Final    Sputum specimen not acceptable for testing.  Please recollect.   RESULT CALLED TO, READ BACK BY AND VERIFIED WITH: Erle Crocker [id number] @ 7989 BY J SCOTTON Performed at [facility], [place] [address] [place], [place] [address]    Report Status 08/23/2017 FINAL  Final         Radiology Studies: Dg Chest 2 View  Result Date: 08/23/2017 CLINICAL DATA:  Pneumonia. EXAM: CHEST  2 VIEW COMPARISON:  Radiographs of August 21, 2017. FINDINGS: The heart size and mediastinal contours are within normal limits. Atherosclerosis of thoracic aorta is noted. Left-sided pacemaker is unchanged in position. No pneumothorax or pleural effusion is noted. Stable diffuse reticular densities are noted throughout both lungs consistent with chronic interstitial lung disease or scarring. Stable right basilar opacity is noted concerning for pneumonia. The visualized skeletal structures are unremarkable. IMPRESSION: Aortic atherosclerosis. Stable right basilar opacity is noted most consistent with pneumonia. Followup PA and lateral chest X-ray is  recommended in 3-4 weeks following trial of antibiotic therapy to ensure resolution and exclude underlying malignancy. Electronically Signed   By: Marijo Conception, M.D.   On: 08/23/2017 16:33   US Abdomen Limited Ruq  Result Date: 08/24/2017 CLINICAL DATA:  Abnormal liver function EXAM: ULTRASOUND ABDOMEN LIMITED RIGHT UPPER QUADRANT COMPARISON:  Ultrasound 05/23/2016 FINDINGS: Gallbladder: Slightly contracted. No shadowing stones. Borderline wall thickening at 3.2 mm. Negative sonographic Murphy. Common bile duct: Diameter: Enlarged measuring up to 9.8 mm, but decreased compared to previous ultrasound at which time the bile duct measured 13 mm. Liver: No focal lesion identified. Within normal limits  in parenchymal echogenicity. Portal vein is patent on color Doppler imaging with normal direction of blood flow towards the liver. IMPRESSION: 1. Slightly contracted gallbladder. Borderline wall thickness which may be partially related to contraction. Negative for stone disease 2. Enlarged common bile duct measuring up to 9.8 mm. This is decreased compared to the 2017 ultrasound at which time bile duct measured 13 mm. Electronically Signed   By: Donavan Foil M.D.   On: 08/24/2017 02:08        Scheduled Meds: . feeding supplement (ENSURE ENLIVE)  120 mL Oral BID BM  . ferrous sulfate  300 mg Oral Daily  . finasteride  5 mg Oral Daily  . flecainide  50 mg Oral Q12H  . gabapentin  400 mg Oral QID  . insulin aspart  0-5 Units Subcutaneous QHS  . insulin aspart  0-9 Units Subcutaneous TID WC  . linagliptin  5 mg Oral Daily  . losartan  50 mg Oral q morning - 10a  . metFORMIN  1,000 mg Oral BID WC  . metoprolol tartrate  25 mg Oral BID  . rivaroxaban  20 mg Oral Q supper  . tamsulosin  0.4 mg Oral QPC supper   Continuous Infusions: . azithromycin Stopped (08/24/17 1823)  . cefTRIAXone (ROCEPHIN)  IV Stopped (08/24/17 1753)     LOS: 2 days    Time spent: 25 mins    Rubylee Zamarripa Arsenio Loader,  MD [facility] Pager [phone]   If 7PM-7AM, please contact night-coverage [email or web address] Password [facility] 08/25/2017, 10:36 AM

## 2017-08-25 NOTE — Evaluation (Signed)
Occupational Therapy Evaluation Patient Details Name: Andrew Norton MRN: [id number] DOB: 12-23-1926 Today's Date: 08/25/2017    History of Present Illness 84 year old male with a history of hypertension, hyperlipidemia, paroxysmal atrial fibrillation, diabetes type 2 came to the hospital after being sent by his PCP for treatment of pneumonia.  Patient failed outpatient treatment of pneumonia and the chest x-ray showed right middle and lower lobe infiltrate.   Clinical Impression   Pt admitted for above. Pt independent with ADLs, prior to becoming sick. Since being sick, wife assisted with LB dressing. Feel pt will benefit from acute OT to increase independence, activity tolerance, and strength prior to d/c.    Follow Up Recommendations  Home health OT;Supervision/Assistance - 24 hour    Equipment Recommendations  Other (comment)(TBD)    Recommendations for Other Services       Precautions / Restrictions Precautions Precautions: Fall Restrictions Weight Bearing Restrictions: No      Mobility Bed Mobility               General bed mobility comments: not assessed-pt in chair upon arrival.  Transfers Overall transfer level: Needs assistance Equipment used: Rolling walker (2 wheeled) Transfers: Sit to/from Stand Sit to Stand: Min guard              Balance      Pt used RW for ambulation-Min guard given for safety.                                     ADL either performed or assessed with clinical judgement   ADL Overall ADL's : Needs assistance/impaired     Grooming: Wash/dry hands;Oral care;Standing;Set up;Supervision/safety               Lower Body Dressing: Min guard;Sit to/from stand Lower Body Dressing Details (indicate cue type and reason): pt able to don/doff socks Toilet Transfer: Min guard;Ambulation;RW(sit to stand from chair)           Functional mobility during ADLs: Min guard;Rolling walker General ADL Comments:  Educated on placement of RW at sink. Pt a little out of breath in session.      Vision Baseline Vision/History: (retina issue per wife)       Perception     Praxis      Pertinent Vitals/Pain Pain Assessment: No/denies pain     Hand Dominance     Extremity/Trunk Assessment Upper Extremity Assessment Upper Extremity Assessment: Generalized weakness(in bilateral shoulder flexors)   Lower Extremity Assessment Lower Extremity Assessment: Defer to PT evaluation       Communication Communication Communication: HOH   Cognition Arousal/Alertness: Awake/alert Behavior During Therapy: WFL for tasks assessed/performed Overall Cognitive Status: Within Functional Limits for tasks assessed                                     General Comments       Exercises     Shoulder Instructions      Home Living Family/patient expects to be discharged to:: Private residence Living Arrangements: Spouse/significant other Available Help at Discharge: Family Type of Home: House       Home Layout: Two level Alternate Level Stairs-Number of Steps: 13 Alternate Level Stairs-Rails: Right;Left;Can reach both Bathroom Shower/Tub: Tub/shower unit;Walk-in shower(uses walk in shower)         Home Equipment: Gilford Rile -  2 wheels;Walker - 4 wheels;Cane - single point;Shower seat;Tub bench          Prior Functioning/Environment Level of Independence: Independent with assistive device(s)        Comments: uses walker; was independent prior to recent sickness-when sick-wife assisted with LB dressing        OT Problem List: Decreased strength;Decreased activity tolerance;Decreased knowledge of use of DME or AE;Decreased knowledge of precautions;Impaired vision/perception      OT Treatment/Interventions: Self-care/ADL training;DME and/or AE instruction;Therapeutic activities;Energy conservation;Patient/family education;Balance training;Visual/perceptual  remediation/compensation;Therapeutic exercise    OT Goals(Current goals can be found in the care plan section) Acute Rehab OT Goals Patient Stated Goal: not stated OT Goal Formulation: With patient/family Time For Goal Achievement: 09/01/17 Potential to Achieve Goals: Good ADL Goals Pt Will Perform Lower Body Bathing: sit to/from stand;with supervision Pt Will Transfer to Toilet: ambulating;with modified independence Additional ADL Goal #1: Pt will go and gather clothing and grooming items with Mod I.  OT Frequency: Min 2X/week   Barriers to D/C:            Co-evaluation              AM-PAC PT "6 Clicks" Daily Activity     Outcome Measure Help from another person eating meals?: None Help from another person taking care of personal grooming?: A Little Help from another person toileting, which includes using toliet, bedpan, or urinal?: A Little Help from another person bathing (including washing, rinsing, drying)?: A Little Help from another person to put on and taking off regular upper body clothing?: A Little Help from another person to put on and taking off regular lower body clothing?: A Little 6 Click Score: 19   End of Session Equipment Utilized During Treatment: Gait belt;Rolling walker Nurse Communication: Mobility status;Other (comment)(d/c recommendation)  Activity Tolerance: Patient tolerated treatment well Patient left: with family/visitor present;in chair  OT Visit Diagnosis: Muscle weakness (generalized) (M62.81)                Time: 7116-5790 OT Time Calculation (min): 20 min Charges:  OT General Charges $OT Visit: 1 Visit OT Evaluation $OT Eval Moderate Complexity: 1 Mod G-Codes:      Naia Ruff L Bralynn Velador OTR/L 08/25/2017, 12:25 PM

## 2017-08-25 NOTE — Plan of Care (Signed)
  Safety: Ability to remain free from injury will improve 08/25/2017 2157 - Progressing by Mickie Kay, RN

## 2017-08-26 LAB — GLUCOSE, CAPILLARY
GLUCOSE-CAPILLARY: 229 mg/dL — AB (ref 65–99)
GLUCOSE-CAPILLARY: 171 mg/dL — AB (ref 65–99)
Glucose-Capillary: 164 mg/dL — ABNORMAL HIGH (ref 65–99)
Glucose-Capillary: 251 mg/dL — ABNORMAL HIGH (ref 65–99)

## 2017-08-26 LAB — COMPREHENSIVE METABOLIC PANEL
ALBUMIN: 2.5 g/dL — AB (ref 3.5–5.0)
ALT: 37 U/L (ref 17–63)
ANION GAP: 11 (ref 5–15)
AST: 29 U/L (ref 15–41)
Alkaline Phosphatase: 156 U/L — ABNORMAL HIGH (ref 38–126)
BUN: 12 mg/dL (ref 6–20)
CHLORIDE: 98 mmol/L — AB (ref 101–111)
CO2: 21 mmol/L — AB (ref 22–32)
Calcium: 8.1 mg/dL — ABNORMAL LOW (ref 8.9–10.3)
Creatinine, Ser: 0.64 mg/dL (ref 0.61–1.24)
GFR calc Af Amer: >60 mL/min (ref >60)
GFR calc non Af Amer: >60 mL/min (ref >60)
Glucose, Bld: 181 mg/dL — ABNORMAL HIGH (ref 65–99)
POTASSIUM: 4.2 mmol/L (ref 3.5–5.1)
SODIUM: 130 mmol/L — AB (ref 135–145)
Total Bilirubin: 0.7 mg/dL (ref 0.3–1.2)
Total Protein: 6.3 g/dL — ABNORMAL LOW (ref 6.5–8.1)

## 2017-08-26 LAB — CBC
HCT: 22.9 % — ABNORMAL LOW (ref 39.0–52.0)
HEMOGLOBIN: 8 g/dL — AB (ref 13.0–17.0)
MCH: 30.9 pg (ref 26.0–34.0)
MCHC: 34.9 g/dL (ref 30.0–36.0)
MCV: 88.4 fL (ref 78.0–100.0)
Platelets: 302 10*3/uL (ref 150–400)
RBC: 2.59 MIL/uL — AB (ref 4.22–5.81)
RDW: 13.4 % (ref 11.5–15.5)
WBC: 8.2 10*3/uL (ref 4.0–10.5)

## 2017-08-26 LAB — TROPONIN I: Troponin I: <0.03 ng/mL (ref <0.03)

## 2017-08-26 LAB — LEGIONELLA PNEUMOPHILA SEROGP 1 UR AG: L. pneumophila Serogp 1 Ur Ag: NEGATIVE

## 2017-08-26 LAB — MAGNESIUM: Magnesium: 1.3 mg/dL — ABNORMAL LOW (ref 1.7–2.4)

## 2017-08-26 MED ORDER — MAGNESIUM SULFATE 2 GM/50ML IV SOLN
2.0000 g | Freq: Once | INTRAVENOUS | Status: AC
  Administered 2017-08-26: 2 g via INTRAVENOUS
  Filled 2017-08-26: qty 50

## 2017-08-26 MED ORDER — SODIUM CHLORIDE 0.9 % IV SOLN
1.0000 g | INTRAVENOUS | Status: DC
  Administered 2017-08-26 – 2017-08-27 (×2): 1 g via INTRAVENOUS
  Filled 2017-08-26: qty 1
  Filled 2017-08-26: qty 10
  Filled 2017-08-26: qty 1

## 2017-08-26 MED ORDER — LORAZEPAM 2 MG/ML IJ SOLN
INTRAMUSCULAR | Status: AC
  Administered 2017-08-26: 1 mg via INTRAVENOUS
  Filled 2017-08-26: qty 1

## 2017-08-26 MED ORDER — LORAZEPAM 2 MG/ML IJ SOLN
1.0000 mg | Freq: Once | INTRAMUSCULAR | Status: AC
  Administered 2017-08-26: 1 mg via INTRAVENOUS

## 2017-08-26 MED ORDER — SODIUM CHLORIDE 0.9 % IV SOLN: 500.0000 mg | INTRAVENOUS | Status: DC

## 2017-08-26 MED ORDER — AZITHROMYCIN 250 MG PO TABS
500.0000 mg | ORAL_TABLET | Freq: Every day | ORAL | Status: DC
  Administered 2017-08-26 – 2017-08-27 (×2): 500 mg via ORAL
  Filled 2017-08-26 (×3): qty 2

## 2017-08-26 NOTE — Care Management Note (Signed)
Case Management Note  Patient Details  Name: Andrew Norton MRN: [id number] Date of Birth: Jan 19, 1927  Subjective/Objective:                  pna via cxr  Action/Plan: Date:  August 26, 2017 Chart reviewed for concurrent status and case management needs.  Will continue to follow patient progress.  Discharge Planning: following for needs.  None present at this time of review. Expected discharge date: August 29, 2017 Velva Harman, BSN, [place], [place]   Expected Discharge Date:                  Expected Discharge Plan:  Home/Self Care  In-House Referral:     Discharge planning Services  CM Consult  Post Acute Care Choice:    Choice offered to:     DME Arranged:    DME Agency:     HH Arranged:    HH Agency:     Status of Service:  In process, will continue to follow  If discussed at Long Length of Stay Meetings, dates discussed:    Additional Comments:  Leeroy Cha, RN 08/26/2017, 9:34 AM

## 2017-08-26 NOTE — Progress Notes (Signed)
PROGRESS NOTE    Andrew Norton  ZOX:[id number] DOB: 1927-01-24 DOA: 08/23/2017 PCP: Laurey Morale, MD   Brief Narrative:  84 year old male with a history of hypertension, hyperlipidemia, paroxysmal atrial fibrillation, diabetes type 2 came to the hospital after being sent by his PCP for treatment of pneumonia.  Patient failed outpatient treatment of pneumonia and the chest x-ray showed right middle and lower lobe infiltrate.  Physical therapy recommended home health physical therapy.   Assessment & Plan:   Principal Problem:   CAP (community acquired pneumonia) Active Problems:   Anemia   Diabetes mellitus type 2 with complications (HCC)   Hyponatremia   Abnormal liver function  Right lower lobe community-acquired pneumonia Acute respiratory distress, improved -Continue supplemental oxygen, nebulizer treatments as needed -Antitussives as needed.  Cultures all remain negative thus far -We will continue IV Rocephin, switch azithromycin to oral.  Given patient is allergic to penicillin we will discharge him on Vantin.  If his cultures come back atypical microorganism will add doxycycline or azithromycin if necessary -Continue provide supportive care. -His respiratory panel is negative, urine strep is negative - Urine Legionella is negative  Acute metabolic encephalopathy, dementia and delirium -Patient's mentation is currently at his baseline.  Generalized weakness -Physical therapy recommended home PT.  Arrangements will be made with the help of case management.  Face-to-face evaluation has been done.  Urinary urgency and intermittent incontinence BPH -Concerns for BPH.  He is already on finasteride.  He is tolerating Flomax well. -UA is negative at the time of admission  Transaminitis -Right upper quadrant ultrasound shows slightly contracted gallbladder, borderline wall thickness but negative for stone.  Enlarged CBD at 9.8 mm which has been decreased in diameter since 2017  from 13 mm. -We will continue to trend LFTs for now. -Acute hepatitis panel is pending  Anemia -Unknown etiology, likely from chronic disease - Hemoglobin stable, continue ferrous sulfate  Diabetes mellitus type 2 -Continue home medications.  Accu-Chek and sliding scale  Essential hypertension Paroxysmal atrial fibrillation -Continue home medications along with Xarelto  DVT prophylaxis: Xarelto Code Status: Full code Family Communication: None at bedside Disposition Plan: If patient continues to do well will discharge in next 24 hours.  Consultants:   None  Procedures:   None  Antimicrobials:   Levaquin 2/8 > 2/9  Rocephine 2/9  Azithromycin 2/9   Subjective: No acute events overnight.  He remains afebrile.  Cultures remain negative.  Objective: Vitals:   08/25/17 2026 08/25/17 2038 08/26/17 0517 08/26/17 1206  BP:  (!) 148/56 (!) 137/55   Pulse:  74 72   Resp:  19 18   Temp:  (!) 97.5 F (36.4 C) 97.6 F (36.4 C)   TempSrc:  Oral Oral   SpO2: 96% 100% 96% 98%  Weight:      Height:        Intake/Output Summary (Last 24 hours) at 08/26/2017 1235 Last data filed at 08/25/2017 2100 Gross per 24 hour  Intake 840 ml  Output -  Net 840 ml   Filed Weights   08/23/17 1937  Weight: 71.4 kg (157 lb 6.4 oz)    Examination:  General exam: Appears calm and comfortable, elderly frail-appearing Respiratory system: Diminished breath sounds at the bases but overall clear to auscultation bilaterally. Cardiovascular system: S1 & S2 heard, RRR. No JVD, murmurs, rubs, gallops or clicks. No pedal edema. Gastrointestinal system: Abdomen is nondistended, soft and nontender. No organomegaly or masses felt. Normal bowel sounds heard. Central nervous  system: Alert and oriented x2-this appears to be around his baseline. No focal neurological deficits. Extremities: Symmetric 5 x 5 power. Skin: No rashes, lesions or ulcers Psychiatry:  Mood & affect appropriate.      Data Reviewed:   CBC: Recent Labs  Lab 08/21/17 1342 08/23/17 1607 08/24/17 0646 08/26/17 0602  WBC 13.7* 10.0 7.8 8.2  NEUTROABS 12.0* 7.6  --   --   HGB 9.7* 9.1* 8.1* 8.0*  HCT 29.1* 27.3* 24.0* 22.9*  MCV 91.3 89.2 89.6 88.4  PLT 326.0 343 310 160   Basic Metabolic Panel: Recent Labs  Lab 08/21/17 1342 08/23/17 1607 08/26/17 0602  NA 130* 129* 130*  K 4.5 4.2 4.2  CL 96 95* 98*  CO2 23 22 21*  GLUCOSE 196* 77 181*  BUN 27* 21* 12  CREATININE 0.97 0.91 0.64  CALCIUM 8.4 8.5* 8.1*  MG  --   --  1.3*   GFR: Estimated Creatinine Clearance: 64.5 mL/min (by C-G formula based on SCr of 0.64 mg/dL). Liver Function Tests: Recent Labs  Lab 08/21/17 1342 08/23/17 1607 08/26/17 0602  AST 36 58* 29  ALT 32 48 37  ALKPHOS 162* 212* 156*  BILITOT 1.0 0.4 0.7  PROT 6.8 7.6 6.3*  ALBUMIN 3.2* 3.0* 2.5*   No results for input(s): LIPASE, AMYLASE in the last 168 hours. No results for input(s): AMMONIA in the last 168 hours. Coagulation Profile: No results for input(s): INR, PROTIME in the last 168 hours. Cardiac Enzymes: No results for input(s): CKTOTAL, CKMB, CKMBINDEX, TROPONINI in the last 168 hours. BNP (last 3 results) No results for input(s): PROBNP in the last 8760 hours. HbA1C: No results for input(s): HGBA1C in the last 72 hours. CBG: Recent Labs  Lab 08/25/17 1123 08/25/17 1703 08/25/17 2036 08/26/17 0738 08/26/17 1208  GLUCAP 214* 151* 175* 164* 251*   Lipid Profile: No results for input(s): CHOL, HDL, LDLCALC, TRIG, CHOLHDL, LDLDIRECT in the last 72 hours. Thyroid Function Tests: No results for input(s): TSH, T4TOTAL, FREET4, T3FREE, THYROIDAB in the last 72 hours. Anemia Panel: No results for input(s): VITAMINB12, FOLATE, FERRITIN, TIBC, IRON, RETICCTPCT in the last 72 hours. Sepsis Labs: Recent Labs  Lab 08/23/17 1625  LATICACIDVEN 2.82*    Recent Results (from the past 240 hour(s))  Blood culture (routine x 2)     Status: None  (Preliminary result)   Collection Time: 08/23/17  5:13 PM  Result Value Ref Range Status   Specimen Description   Final    BLOOD LEFT HAND Performed at [facility], [place] [address], [place], [place] [address]    Special Requests   Final    IN PEDIATRIC BOTTLE Blood Culture adequate volume Performed at [facility] [address] [place], [place] [address]    Culture   Final    NO GROWTH 3 DAYS Performed at [facility], [place] [address] [place], [place] [address]    Report Status PENDING  Incomplete  Blood culture (routine x 2)     Status: None (Preliminary result)   Collection Time: 08/23/17  5:16 PM  Result Value Ref Range Status   Specimen Description   Final    BLOOD LEFT ANTECUBITAL Performed at [facility] [address], [place], [place] [address]    Special Requests   Final    BOTTLES DRAWN AEROBIC AND ANAEROBIC Blood Culture adequate volume Performed at [facility] [address], [place], [place] [address]  Culture   Final    NO GROWTH 3 DAYS Performed at [facility], [place] [address], [place], [place] [address]    Report Status PENDING  Incomplete  Respiratory Panel by PCR     Status: None   Collection Time: 08/23/17  9:39 PM  Result Value Ref Range Status   Adenovirus NOT DETECTED NOT DETECTED Final   Coronavirus 229E NOT DETECTED NOT DETECTED Final   Coronavirus HKU1 NOT DETECTED NOT DETECTED Final   Coronavirus NL63 NOT DETECTED NOT DETECTED Final   Coronavirus OC43 NOT DETECTED NOT DETECTED Final   Metapneumovirus NOT DETECTED NOT DETECTED Final   Rhinovirus / Enterovirus NOT DETECTED NOT DETECTED Final   Influenza A NOT DETECTED NOT DETECTED Final   Influenza A H1 NOT DETECTED NOT DETECTED Final   Influenza A H1 2009 NOT DETECTED NOT DETECTED Final   Influenza A H3 NOT DETECTED NOT DETECTED Final   Influenza B NOT DETECTED NOT DETECTED Final   Parainfluenza Virus 1 NOT  DETECTED NOT DETECTED Final   Parainfluenza Virus 2 NOT DETECTED NOT DETECTED Final   Parainfluenza Virus 3 NOT DETECTED NOT DETECTED Final   Parainfluenza Virus 4 NOT DETECTED NOT DETECTED Final   Respiratory Syncytial Virus NOT DETECTED NOT DETECTED Final   Bordetella pertussis NOT DETECTED NOT DETECTED Final   Chlamydophila pneumoniae NOT DETECTED NOT DETECTED Final   Mycoplasma pneumoniae NOT DETECTED NOT DETECTED Final    Comment: Performed at [facility], [place] [address], [place], [place] [address]  Culture, sputum-assessment     Status: None   Collection Time: 08/23/17 10:54 PM  Result Value Ref Range Status   Specimen Description EXPECTORATED SPUTUM  Final   Special Requests Normal  Final   Sputum evaluation   Final    Sputum specimen not acceptable for testing.  Please recollect.   RESULT CALLED TO, READ BACK BY AND VERIFIED WITH: Erle Crocker [id number] @ [id number] BY J SCOTTON Performed at [facility], [place] [address], [place], [place] [address]    Report Status 08/23/2017 FINAL  Final         Radiology Studies: No results found.      Scheduled Meds: . azithromycin  500 mg Oral Daily  . feeding supplement (ENSURE ENLIVE)  120 mL Oral BID BM  . ferrous sulfate  300 mg Oral Daily  . finasteride  5 mg Oral Daily  . flecainide  50 mg Oral Q12H  . gabapentin  400 mg Oral QID  . insulin aspart  0-5 Units Subcutaneous QHS  . insulin aspart  0-9 Units Subcutaneous TID WC  . ipratropium-albuterol  3 mL Nebulization BID  . linagliptin  5 mg Oral Daily  . losartan  50 mg Oral q morning - 10a  . metFORMIN  1,000 mg Oral BID WC  . metoprolol tartrate  25 mg Oral BID  . rivaroxaban  20 mg Oral Q supper  . tamsulosin  0.4 mg Oral QPC supper   Continuous Infusions: . cefTRIAXone (ROCEPHIN)  IV       LOS: 3 days    Time spent: 25 mins    Rianne Degraaf Arsenio Loader, MD [facility] Pager [phone]   If 7PM-7AM, please contact  night-coverage [email or web address] Password [facility] 08/26/2017, 12:35 PM

## 2017-08-26 NOTE — Evaluation (Signed)
Physical Therapy Evaluation Patient Details Name: Andrew Norton MRN: [id number] DOB: 07-03-1927 Today's Date: 08/26/2017   History of Present Illness  84 year old male with a history of hypertension, hyperlipidemia, paroxysmal atrial fibrillation, diabetes type 2 came to the hospital after being sent by his PCP for treatment of pneumonia.  Patient failed outpatient treatment of pneumonia and the chest x-ray showed right middle and lower lobe infiltrate.    Clinical Impression  On eval, pt was Min guard assist for mobility. He walked ~85 feet with his 2 wheeled walker. Pt demonstrates general weakness, decreased activity tolerance, and impaired gait and balance. He fatigues fairly easily with activity. Noted some mild confusion/memory issues at start of session. Attempted to have pt practice negotiating stairs since he has some to enter home and a flight to get up to his bedroom. He initially was agreeable to practicing stair negotiaton but then he stated he was really tired. Discussed d/c plan-pt stated he will return home with his wife. He is agreeable to HHPT. He is not open to the idea of rehab prior to returning home. Encouraged pt to consider staying on 1st level of home for a day or so until he is stronger and has more energy. He was not amenable to this idea- "I don't have any problems with getting up the stairs. I've done this before many times." Will continue to follow and progress activity as able. Wife was not present during the session.    Follow Up Recommendations Home health PT;Supervision/Assistance - 24 hour    Equipment Recommendations  None recommended by PT    Recommendations for Other Services       Precautions / Restrictions Precautions Precautions: Fall Restrictions Weight Bearing Restrictions: No      Mobility  Bed Mobility               General bed mobility comments: pt sitting EOB  Transfers Overall transfer level: Needs assistance Equipment used:  Rolling walker (2 wheeled) Transfers: Sit to/from Stand Sit to Stand: Min guard         General transfer comment: close guard for safety.   Ambulation/Gait Ambulation/Gait assistance: Min guard Ambulation Distance (Feet): 100 Feet Assistive device: Rolling walker (2 wheeled) Gait Pattern/deviations: Step-through pattern;Decreased stride length     General Gait Details: close guard for safety. slow gait speed. Pt reported and appeared fatigued during/after short walk.   Stairs            Wheelchair Mobility    Modified Rankin (Stroke Patients Only)       Balance Overall balance assessment: Needs assistance         Standing balance support: Bilateral upper extremity supported Standing balance-Leahy Scale: Poor                               Pertinent Vitals/Pain Pain Assessment: No/denies pain    Home Living Family/patient expects to be discharged to:: Private residence Living Arrangements: Spouse/significant other Available Help at Discharge: Family Type of Home: House Home Access: Stairs to enter Entrance Stairs-Rails: Psychiatric nurse of Steps: 4 Home Layout: Two level Home Equipment: Environmental consultant - 2 wheels;Walker - 4 wheels;Cane - single point;Shower seat;Tub bench      Prior Function Level of Independence: Independent with assistive device(s)         Comments: uses walker     Hand Dominance        Extremity/Trunk Assessment  Upper Extremity Assessment Upper Extremity Assessment: Generalized weakness    Lower Extremity Assessment Lower Extremity Assessment: Generalized weakness    Cervical / Trunk Assessment Cervical / Trunk Assessment: Kyphotic  Communication   Communication: HOH  Cognition Arousal/Alertness: Awake/alert Behavior During Therapy: WFL for tasks assessed/performed Overall Cognitive Status: Within Functional Limits for tasks assessed                                         General Comments      Exercises     Assessment/Plan    PT Assessment Patient needs continued PT services  PT Problem List Decreased strength;Decreased balance;Decreased mobility;Decreased activity tolerance       PT Treatment Interventions DME instruction;Gait training;Functional mobility training;Therapeutic activities;Balance training;Patient/family education;Therapeutic exercise;Stair training    PT Goals (Current goals can be found in the Care Plan section)  Acute Rehab PT Goals Patient Stated Goal: home PT Goal Formulation: With patient Time For Goal Achievement: 09/09/17 Potential to Achieve Goals: Good    Frequency Min 3X/week   Barriers to discharge        Co-evaluation               AM-PAC PT "6 Clicks" Daily Activity  Outcome Measure Difficulty turning over in bed (including adjusting bedclothes, sheets and blankets)?: A Lot Difficulty moving from lying on back to sitting on the side of the bed? : A Lot Difficulty sitting down on and standing up from a chair with arms (e.g., wheelchair, bedside commode, etc,.)?: A Little Help needed moving to and from a bed to chair (including a wheelchair)?: A Little Help needed walking in hospital room?: A Little Help needed climbing 3-5 steps with a railing? : A Lot 6 Click Score: 15    End of Session Equipment Utilized During Treatment: Gait belt Activity Tolerance: Patient limited by fatigue Patient left: in chair;with call bell/phone within reach   PT Visit Diagnosis: Muscle weakness (generalized) (M62.81);Difficulty in walking, not elsewhere classified (R26.2)    Time: 7371-0626 PT Time Calculation (min) (ACUTE ONLY): 15 min   Charges:   PT Evaluation $PT Eval Moderate Complexity: 1 Mod     PT G Codes:          Weston Anna, MPT Pager: [phone]

## 2017-08-26 NOTE — Progress Notes (Signed)
1930 and pt states someone is playing jokes on him as he is supposed to be in [place] at [facility] and he does not know where he is. His wife called in to talk with him but got upset that she was not here with him. Attempts to re orient him I thought were successful but moments later he is attempting to crawl out the foot of the bed , somewhat combative insisting on going to the bathroom. Bed alarms on.  Escorted to the toilet walking with his walker. When he washed his hands and was handed papertowels - he would not dry his hands, just threw them to the floor. Returned to bed. Pt then stopped talking to [place]. Son Nicki Reaper arrived and pt would not acknowledge him. Pt walked in the hallway with his son following behind. Pt will not acknowledge me.

## 2017-08-26 NOTE — Progress Notes (Signed)
2200 pt had been resting in bed with son at bedside as he started clutching his chest and yelling with chest pain hurting - oxygen started, EKG obtained, CBG obtained. Rapid response called and pt monitored. Lamar Blinks, NP informed with orders noted. 1mg  Ativan iv given for agitation. Both of pts sons at bedside

## 2017-08-26 NOTE — Progress Notes (Signed)
PHARMACIST - PHYSICIAN COMMUNICATION DR:  Reesa Chew CONCERNING: Antibiotic IV to Oral Route Change Policy  RECOMMENDATION: This patient is receiving Azithromycin by the intravenous route.  Based on criteria approved by the Pharmacy and Therapeutics Committee, the antibiotic(s) is/are being converted to the equivalent oral dose form(s).   DESCRIPTION: These criteria include:  Patient being treated for a respiratory tract infection, urinary tract infection, cellulitis or clostridium difficile associated diarrhea if on metronidazole  The patient is not neutropenic and does not exhibit a GI malabsorption state  The patient is eating (either orally or via tube) and/or has been taking other orally administered medications for a least 24 hours  The patient is improving clinically and has a Tmax < 100.5  If you have questions about this conversion, please contact the Pharmacy Department  []   [phone] )  [facility] []   [phone] )  [facility] []   [phone] )  [facility] []   [phone] )  [facility] [x]   [phone] )  [facility]     Lindell Spar, PharmD, [place] Pager: [phone] 08/26/2017 12:19 PM

## 2017-08-27 LAB — COMPREHENSIVE METABOLIC PANEL
ALBUMIN: 2.5 g/dL — AB (ref 3.5–5.0)
ALT: 32 U/L (ref 17–63)
ANION GAP: 10 (ref 5–15)
AST: 25 U/L (ref 15–41)
Alkaline Phosphatase: 145 U/L — ABNORMAL HIGH (ref 38–126)
BILIRUBIN TOTAL: 0.5 mg/dL (ref 0.3–1.2)
BUN: 14 mg/dL (ref 6–20)
CO2: 21 mmol/L — AB (ref 22–32)
Calcium: 8.4 mg/dL — ABNORMAL LOW (ref 8.9–10.3)
Chloride: 96 mmol/L — ABNORMAL LOW (ref 101–111)
Creatinine, Ser: 0.59 mg/dL — ABNORMAL LOW (ref 0.61–1.24)
GFR calc Af Amer: >60 mL/min (ref >60)
GFR calc non Af Amer: >60 mL/min (ref >60)
GLUCOSE: 179 mg/dL — AB (ref 65–99)
POTASSIUM: 4.5 mmol/L (ref 3.5–5.1)
SODIUM: 127 mmol/L — AB (ref 135–145)
Total Protein: 6.5 g/dL (ref 6.5–8.1)

## 2017-08-27 LAB — CBC
HCT: 23.9 % — ABNORMAL LOW (ref 39.0–52.0)
HEMOGLOBIN: 8.4 g/dL — AB (ref 13.0–17.0)
MCH: 31.1 pg (ref 26.0–34.0)
MCHC: 35.1 g/dL (ref 30.0–36.0)
MCV: 88.5 fL (ref 78.0–100.0)
Platelets: 289 10*3/uL (ref 150–400)
RBC: 2.7 MIL/uL — AB (ref 4.22–5.81)
RDW: 13.2 % (ref 11.5–15.5)
WBC: 7.1 10*3/uL (ref 4.0–10.5)

## 2017-08-27 LAB — GLUCOSE, CAPILLARY
GLUCOSE-CAPILLARY: 149 mg/dL — AB (ref 65–99)
GLUCOSE-CAPILLARY: 100 mg/dL — AB (ref 65–99)
Glucose-Capillary: 206 mg/dL — ABNORMAL HIGH (ref 65–99)
Glucose-Capillary: 104 mg/dL — ABNORMAL HIGH (ref 65–99)

## 2017-08-27 LAB — MAGNESIUM: Magnesium: 1.5 mg/dL — ABNORMAL LOW (ref 1.7–2.4)

## 2017-08-27 MED ORDER — HALOPERIDOL LACTATE 5 MG/ML IJ SOLN
2.5000 mg | Freq: Once | INTRAMUSCULAR | Status: AC
  Administered 2017-08-27: 2.5 mg via INTRAVENOUS
  Filled 2017-08-27: qty 1

## 2017-08-27 MED ORDER — METOPROLOL TARTRATE 25 MG PO TABS: 12.5000 mg | ORAL_TABLET | Freq: Two times a day (BID) | ORAL | 0 refills | Status: AC

## 2017-08-27 MED ORDER — AZITHROMYCIN 500 MG PO TABS: 500.0000 mg | ORAL_TABLET | Freq: Every day | ORAL | 0 refills | Status: AC

## 2017-08-27 MED ORDER — TAMSULOSIN HCL 0.4 MG PO CAPS: 0.4000 mg | ORAL_CAPSULE | Freq: Every day | ORAL | 1 refills | Status: DC

## 2017-08-27 MED ORDER — CEFPODOXIME PROXETIL 100 MG PO TABS: 100.0000 mg | ORAL_TABLET | Freq: Two times a day (BID) | ORAL | 0 refills | Status: AC

## 2017-08-27 MED ORDER — ALBUTEROL SULFATE HFA 108 (90 BASE) MCG/ACT IN AERS: 2.0000 | INHALATION_SPRAY | Freq: Four times a day (QID) | RESPIRATORY_TRACT | 2 refills | Status: DC | PRN

## 2017-08-27 NOTE — Discharge Summary (Signed)
Physician Discharge Summary  Andrew Norton CWC:[id number] DOB: 30-Sep-1926 DOA: 08/23/2017  PCP: Laurey Morale, MD  Admit date: 08/23/2017 Discharge date: 08/27/2017  Admitted From:  Disposition:   Recommendations for Outpatient Follow-up:  1. Follow up with PCP in 1-2 weeks 2. Follow-up with cardiology, Dr Curt Bears in about 2 weeks 3. Take Vantin orally for 5 days azithromycin orally for 3 days 4. Albuterol has been prescribed if necessary 5. Metoprolol dose has been reduced to 12.5 mg orally. 6. Arrangements for home physical therapy, RN and aide has been made.   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  Patient had failed outpatient therapy with azithromycin.  Chest x-ray showed right middle and lower lobe infiltrate.  He was started on Rocephin and azithromycin which he did well.  He was weaned off of oxygen.  He was seen by physical therapy who recommended home health PT.  During his stay he had a brief episode of agitation and confusion likely secondary to delirium and was easily redirectable.  Also had one episode of bradycardia subsequently causing his pacemaker to kick in and he had atrial paced rhythm.  His metoprolol was decreased to 12.5 mg.  At the time of discharge his heart rate was back up again to 70s with intrinsic rhythm. Today he is medically stable and has reached maximum benefit from hospital stay.  He is ready to be discharged with outpatient follow-up recommendations as stated above.  No complaints today.  Overnight had episode of agitation and concerns for anxiety with "pacemaker firing"therefore rapid response was called.  No complaints this morning  otherwise and he is back to his baseline.   Discharge Diagnoses:  Principal Problem:   CAP (community acquired pneumonia) Active Problems:   Anemia   Diabetes mellitus type 2 with complications (HCC)   Hyponatremia   Abnormal liver function  Right lower lobe community-acquired pneumonia Acute respiratory distress which is improved Currently he is weaned off of oxygen doing well on nasal cannula We will transition his IV antibiotics to oral Vantin for 5 days and will also give him 3 more days of azithromycin to cover for any atypicals -Continue to provide supportive care -Respiratory panel and urine strep is negative -Urine Legionella has been negative as well  Intermittent confusion Acute delirium with a history of dementia -His mentation currently is at baseline.  We will not pursue further workup at this time, he can follow-up outpatient.  Tachybradycardia syndrome Paroxysmal atrial fibrillation - He currently has a [employer]tronic dual-chamber pacemaker in place. - Due to events of bradycardia overnight, his metoprolol dose has been decreased to 12.5 mg -Continue rest of the medications, follow-up with outpatient cardiology -Continue Xarelto  Urinary urgency and intermittent incontinence Benign prostate hyperplasia -He is already on finasteride, Flomax has been added -UA is negative at the time of admission  Transaminitis -Right upper quadrant showed slightly contracted gallbladder with dilated CBD at 9.8 mm but this has decreased in size since 2017 from 13 mm.  LFTs are normal.  No further inpatient workup indicated  Anemia chronic disease -Hemoglobin stable without any signs of bleeding, continue outpatient ferrous sulfate  Diabetes type 2 -Continue home medication  Essential hypertension -Continue losartan, Lopressor.  Discharge Instructions  Allergies as of 08/27/2017      Reactions   Penicillins Hives   Has patient had a PCN reaction causing immediate rash,  facial/tongue/throat swelling, SOB or lightheadedness with hypotension: Yes Has patient had a PCN reaction causing severe rash involving mucus membranes or skin necrosis: No Has patient had a PCN reaction that required hospitalization: Yes Has patient had a PCN reaction occurring within the last 10 years:No If all of the above answers are "NO", then may proceed with Cephalosporin use.   Ppd [tuberculin Purified Protein Derivative]       Medication List    TAKE these medications   albuterol 108 (90 Base) MCG/ACT inhaler Commonly known as:  PROVENTIL HFA;VENTOLIN HFA Inhale 2 puffs into the lungs every 6 (six) hours as needed for wheezing or shortness of breath.   azithromycin 500 MG tablet Commonly known as:  ZITHROMAX Take 1 tablet (500 mg total) by mouth daily for 3 days. Take 1 tablet daily for 3 days. What changed:    medication strength  how much to take  how to take this  when to take this  additional instructions   cefpodoxime 100 MG tablet Commonly known as:  VANTIN Take 1 tablet (100 mg total) by mouth 2 (two) times daily for 5 days.   cyanocobalamin 1000 MCG/ML injection Commonly known as:  (VITAMIN B-12) Inject 1,000 mcg into the muscle every 30 (thirty) days.   cyclobenzaprine 10 MG tablet Commonly known as:  FLEXERIL TAKE 1 TABLET 3 TIMES DAILYAS NEEDED FOR MUSCLE SPASMS   Ferrous Sulfate 220 (44 Fe) MG/5ML Liqd Take 7 mLs by mouth daily. TAKE 7 MLS BY MOUTH DAILY   finasteride 5 MG tablet Commonly known as:  PROSCAR Take 1 tablet (5 mg total) by mouth daily.   flecainide 50 MG tablet Commonly known as:  TAMBOCOR Take 1 tablet (50 mg total) by mouth every 12 (twelve) hours.   gabapentin 400 MG capsule Commonly known as:  NEURONTIN TAKE 1 CAPSULE 4 TIMES     DAILY   glipiZIDE 10 MG tablet Commonly known as:  GLUCOTROL Take 0.5 tablets (5 mg total) by mouth 2 (two) times daily. What changed:  Another medication with the same name was removed.  Continue taking this medication, and follow the directions you see here.   HYDROcodone-acetaminophen 5-325 MG tablet Commonly known as:  NORCO/VICODIN Take 1 tablet by mouth every 6 (six) hours as needed for moderate pain.   lidocaine 5 % Commonly known as:  LIDODERM Place 1 patch onto the skin daily. Remove & Discard patch within 12 hours or as directed by MD   losartan 50 MG tablet Commonly known as:  COZAAR TAKE 1 TABLET EVERY MORNING   metFORMIN 1000 MG tablet Commonly known as:  GLUCOPHAGE TAKE 1 TABLET TWICE A DAY  WITH MEALS   metoprolol tartrate 25 MG tablet Commonly known as:  LOPRESSOR Take 0.5 tablets (12.5 mg total) by mouth 2 (two) times daily. What changed:  how much to take   NUTRITIONAL SUPPLEMENT Liqd Take 120 mLs by mouth 2 (two) times daily. SF MedPass for nutritional support   polyethylene glycol powder powder Commonly known as:  GLYCOLAX/MIRALAX Take 17 g by mouth daily. What changed:    when to take this  reasons to take this   rivaroxaban 20 MG Tabs tablet Commonly known as:  XARELTO Take 1 tablet (20 mg total) by mouth daily with supper.   sitaGLIPtin 100 MG tablet Commonly known as:  JANUVIA  Take 1 tablet (100 mg total) by mouth daily.   tamsulosin 0.4 MG Caps capsule Commonly known as:  FLOMAX Take 1 capsule (0.4 mg total) by mouth daily after supper.   TRUE METRIX BLOOD GLUCOSE TEST VI by In Vitro route 2 (two) times daily. Diagnosis code is E 11.9       Allergies  Allergen Reactions  . Penicillins Hives    Has patient had a PCN reaction causing immediate rash, facial/tongue/throat swelling, SOB or lightheadedness with hypotension: Yes Has patient had a PCN reaction causing severe rash involving mucus membranes or skin necrosis: No Has patient had a PCN reaction that required hospitalization: Yes Has patient had a PCN reaction occurring within the last 10 years:No If all of the above answers are "NO", then may proceed with  Cephalosporin use.   Marland Kitchen Ppd [Tuberculin Purified Protein Derivative]     On your next visit with your primary care physician please Get Medicines reviewed and adjusted.   Please request your Prim.MD to go over all Hospital Tests and Procedure/Radiological results at the follow up, please get all Hospital records sent to your Prim MD by signing hospital release before you go home.   If you experience worsening of your admission symptoms, develop shortness of breath, life threatening emergency, suicidal or homicidal thoughts you must seek medical attention immediately by calling 911 or calling your MD immediately  if symptoms less severe.  You Must read complete instructions/literature along with all the possible adverse reactions/side effects for all the Medicines you take and that have been prescribed to you. Take any new Medicines after you have completely understood and accpet all the possible adverse reactions/side effects.   Do not drive, operate heavy machinery, perform activities at heights, swimming or participation in water activities or provide baby sitting services if your were admitted for syncope or siezures until you have seen by Primary MD or a Neurologist and advised to do so again.  Do not drive when taking Pain medications.    Do not take more than prescribed Pain, Sleep and Anxiety Medications  Special Instructions: If you have smoked or chewed Tobacco  in the last 2 yrs please stop smoking, stop any regular Alcohol  and or any Recreational drug use.  Wear Seat belts while driving.   Please note  You were cared for by a hospitalist during your hospital stay. If you have any questions about your discharge medications or the care you received while you were in the hospital after you are discharged, you can call the unit and asked to speak with the hospitalist on call if the hospitalist that took care of you is not available. Once you are discharged, your  primary care physician will handle any further medical issues. Please note that NO REFILLS for any discharge medications will be authorized once you are discharged, as it is imperative that you return to your primary care physician (or establish a relationship with a primary care physician if you do not have one) for your aftercare needs so that they can reassess your need for medications and monitor your lab values.   Increase activity slowly        Consultations:  Physical therapy   Procedures/Studies: Dg Chest 2 View  Result Date: 08/23/2017 CLINICAL DATA:  Pneumonia. EXAM: CHEST  2 VIEW COMPARISON:  Radiographs of August 21, 2017. FINDINGS: The heart size and mediastinal contours are within normal limits. Atherosclerosis of thoracic aorta is noted. Left-sided pacemaker is unchanged in position.  No pneumothorax or pleural effusion is noted. Stable diffuse reticular densities are noted throughout both lungs consistent with chronic interstitial lung disease or scarring. Stable right basilar opacity is noted concerning for pneumonia. The visualized skeletal structures are unremarkable. IMPRESSION: Aortic atherosclerosis. Stable right basilar opacity is noted most consistent with pneumonia. Followup PA and lateral chest X-ray is recommended in 3-4 weeks following trial of antibiotic therapy to ensure resolution and exclude underlying malignancy. Electronically Signed   By: Marijo Conception, M.D.   On: 08/23/2017 16:33   Dg Chest 2 View  Result Date: 08/22/2017 CLINICAL DATA:  Cough and congestion. EXAM: CHEST  2 VIEW COMPARISON:  05/29/2016 FINDINGS: There is a left chest wall pacer device identified with leads in the right atrial appendage and right ventricle. Normal heart size. Aortic atherosclerosis. The lungs are hyperinflated. Airspace consolidation within the right lower lobe and right middle lobe is identified compatible with pneumonia. This is on a background of diffuse peripheral predominant  reticular interstitial opacities. Flowing ventral syndesmophytes are identified throughout the thoracic spine. IMPRESSION: 1. Right middle lobe and right lower lobe pneumonia. 2. Bilateral peripheral predominant interstitial reticulation suggestive of chronic interstitial lung disease. Suggest follow-up imaging with high-resolution CT of the chest. 3.  Aortic Atherosclerosis (ICD10-I70.0). Electronically Signed   By: Kerby Moors M.D.   On: 08/22/2017 08:35   US Abdomen Limited Ruq  Result Date: 08/24/2017 CLINICAL DATA:  Abnormal liver function EXAM: ULTRASOUND ABDOMEN LIMITED RIGHT UPPER QUADRANT COMPARISON:  Ultrasound 05/23/2016 FINDINGS: Gallbladder: Slightly contracted. No shadowing stones. Borderline wall thickening at 3.2 mm. Negative sonographic Murphy. Common bile duct: Diameter: Enlarged measuring up to 9.8 mm, but decreased compared to previous ultrasound at which time the bile duct measured 13 mm. Liver: No focal lesion identified. Within normal limits in parenchymal echogenicity. Portal vein is patent on color Doppler imaging with normal direction of blood flow towards the liver. IMPRESSION: 1. Slightly contracted gallbladder. Borderline wall thickness which may be partially related to contraction. Negative for stone disease 2. Enlarged common bile duct measuring up to 9.8 mm. This is decreased compared to the 2017 ultrasound at which time bile duct measured 13 mm. Electronically Signed   By: Donavan Foil M.D.   On: 08/24/2017 02:08      Subjective: Episode of acute agitation overnight therefore rapid response called.  Briefly also had atrial paced rhythm but this morning his heart rate is back up to 70s.  No other complaints.  Discharge Exam: Vitals:   08/26/17 2225 08/27/17 0725  BP: 135/84   Pulse: 74   Resp: 20   Temp:    SpO2: 100% 97%   Vitals:   08/26/17 2215 08/26/17 2220 08/26/17 2225 08/27/17 0725  BP: (!) 144/67 (!) 157/63 135/84   Pulse: 77 75 74   Resp: 18 20 20     Temp:      TempSrc:      SpO2:  99% 100% 97%  Weight:      Height:        General: Pt is alert, awake, not in acute distress Cardiovascular: RRR, S1/S2 +, no rubs, no gallops Respiratory: CTA bilaterally, no wheezing, no rhonchi Abdominal: Soft, NT, ND, bowel sounds + Extremities: no edema, no cyanosis    The results of significant diagnostics from this hospitalization (including imaging, microbiology, ancillary and laboratory) are listed below for reference.     Microbiology: Recent Results (from the past 240 hour(s))  Blood culture (routine x 2)  Status: None (Preliminary result)   Collection Time: 08/23/17  5:13 PM  Result Value Ref Range Status   Specimen Description   Final    BLOOD LEFT HAND Performed at [facility], [address], [place], [place] [address]    Special Requests   Final    IN PEDIATRIC BOTTLE Blood Culture adequate volume Performed at [facility] [address], [place], [place] [address]    Culture   Final    NO GROWTH 4 DAYS Performed at [facility], [place] [address], [place], [place] [address]    Report Status PENDING  Incomplete  Blood culture (routine x 2)     Status: None (Preliminary result)   Collection Time: 08/23/17  5:16 PM  Result Value Ref Range Status   Specimen Description   Final    BLOOD LEFT ANTECUBITAL Performed at [facility] [address] [place], [place] [address]    Special Requests   Final    BOTTLES DRAWN AEROBIC AND ANAEROBIC Blood Culture adequate volume Performed at [facility] [address] [place], [place] [address]    Culture   Final    NO GROWTH 4 DAYS Performed at [facility], [place] [address], [place], [place] [address]    Report Status PENDING  Incomplete  Respiratory Panel by PCR     Status: None   Collection Time: 08/23/17  9:39 PM  Result Value Ref Range Status   Adenovirus NOT DETECTED NOT DETECTED Final    Coronavirus 229E NOT DETECTED NOT DETECTED Final   Coronavirus HKU1 NOT DETECTED NOT DETECTED Final   Coronavirus NL63 NOT DETECTED NOT DETECTED Final   Coronavirus OC43 NOT DETECTED NOT DETECTED Final   Metapneumovirus NOT DETECTED NOT DETECTED Final   Rhinovirus / Enterovirus NOT DETECTED NOT DETECTED Final   Influenza A NOT DETECTED NOT DETECTED Final   Influenza A H1 NOT DETECTED NOT DETECTED Final   Influenza A H1 2009 NOT DETECTED NOT DETECTED Final   Influenza A H3 NOT DETECTED NOT DETECTED Final   Influenza B NOT DETECTED NOT DETECTED Final   Parainfluenza Virus 1 NOT DETECTED NOT DETECTED Final   Parainfluenza Virus 2 NOT DETECTED NOT DETECTED Final   Parainfluenza Virus 3 NOT DETECTED NOT DETECTED Final   Parainfluenza Virus 4 NOT DETECTED NOT DETECTED Final   Respiratory Syncytial Virus NOT DETECTED NOT DETECTED Final   Bordetella pertussis NOT DETECTED NOT DETECTED Final   Chlamydophila pneumoniae NOT DETECTED NOT DETECTED Final   Mycoplasma pneumoniae NOT DETECTED NOT DETECTED Final    Comment: Performed at [facility], [place] [address] [place], [place] [address]  Culture, sputum-assessment     Status: None   Collection Time: 08/23/17 10:54 PM  Result Value Ref Range Status   Specimen Description EXPECTORATED SPUTUM  Final   Special Requests Normal  Final   Sputum evaluation   Final    Sputum specimen not acceptable for testing.  Please recollect.   RESULT CALLED TO, READ BACK BY AND VERIFIED WITH: Erle Crocker [id number] @ 7494 BY J SCOTTON Performed at [facility], [place] [address], [place], [place] [address]    Report Status 08/23/2017 FINAL  Final     Labs: BNP (last 3 results) No results for input(s): BNP in the last 8760 hours. Basic Metabolic Panel: Recent Labs  Lab 08/21/17 1342 08/23/17 1607 08/26/17 0602 08/27/17 0855  NA 130* 129* 130* 127*  K 4.5 4.2 4.2 4.5  CL 96 95* 98* 96*  CO2 23 22 21* 21*  GLUCOSE 196* 77 181*  179*  BUN 27* 21* 12 14  CREATININE 0.97 0.91 0.64 0.59*  CALCIUM 8.4 8.5* 8.1* 8.4*  MG  --   --  1.3* 1.5*   Liver Function Tests: Recent Labs  Lab 08/21/17 1342 08/23/17 1607 08/26/17 0602 08/27/17 0855  AST 36 58* 29 25  ALT 32 48 37 32  ALKPHOS 162* 212* 156* 145*  BILITOT 1.0 0.4 0.7 0.5  PROT 6.8 7.6 6.3* 6.5  ALBUMIN 3.2* 3.0* 2.5* 2.5*   No results for input(s): LIPASE, AMYLASE in the last 168 hours. No results for input(s): AMMONIA in the last 168 hours. CBC: Recent Labs  Lab 08/21/17 1342 08/23/17 1607 08/24/17 0646 08/26/17 0602 08/27/17 0855  WBC 13.7* 10.0 7.8 8.2 7.1  NEUTROABS 12.0* 7.6  --   --   --   HGB 9.7* 9.1* 8.1* 8.0* 8.4*  HCT 29.1* 27.3* 24.0* 22.9* 23.9*  MCV 91.3 89.2 89.6 88.4 88.5  PLT 326.0 343 310 302 289   Cardiac Enzymes: Recent Labs  Lab 08/26/17 2242  TROPONINI <0.03   BNP: Invalid input(s): POCBNP CBG: Recent Labs  Lab 08/26/17 0738 08/26/17 1208 08/26/17 1557 08/26/17 2205 08/27/17 0811  GLUCAP 164* 251* 229* 171* 149*   D-Dimer No results for input(s): DDIMER in the last 72 hours. Hgb A1c No results for input(s): HGBA1C in the last 72 hours. Lipid Profile No results for input(s): CHOL, HDL, LDLCALC, TRIG, CHOLHDL, LDLDIRECT in the last 72 hours. Thyroid function studies No results for input(s): TSH, T4TOTAL, T3FREE, THYROIDAB in the last 72 hours.  Invalid input(s): FREET3 Anemia work up No results for input(s): VITAMINB12, FOLATE, FERRITIN, TIBC, IRON, RETICCTPCT in the last 72 hours. Urinalysis    Component Value Date/Time   COLORURINE YELLOW 08/23/2017 [place] 08/23/2017 1614   LABSPEC 1.015 08/23/2017 1614   PHURINE 5.0 08/23/2017 1614   GLUCOSEU NEGATIVE 08/23/2017 1614   HGBUR NEGATIVE 08/23/2017 1614   HGBUR negative 01/12/2008 0000   BILIRUBINUR NEGATIVE 08/23/2017 1614   BILIRUBINUR 1+ 08/21/2017 1651   KETONESUR NEGATIVE 08/23/2017 1614   PROTEINUR NEGATIVE 08/23/2017  1614   UROBILINOGEN 1.0 08/21/2017 1651   UROBILINOGEN 1.0 04/13/2015 0022   NITRITE NEGATIVE 08/23/2017 1614   LEUKOCYTESUR NEGATIVE 08/23/2017 1614   Sepsis Labs Invalid input(s): PROCALCITONIN,  WBC,  LACTICIDVEN Microbiology Recent Results (from the past 240 hour(s))  Blood culture (routine x 2)     Status: None (Preliminary result)   Collection Time: 08/23/17  5:13 PM  Result Value Ref Range Status   Specimen Description   Final    BLOOD LEFT HAND Performed at [facility], [place] [address] [place], [place] [address]    Special Requests   Final    IN PEDIATRIC BOTTLE Blood Culture adequate volume Performed at [facility] [address], [place], [place] [address]    Culture   Final    NO GROWTH 4 DAYS Performed at [facility], [place] [address] [place], [place] [address]    Report Status PENDING  Incomplete  Blood culture (routine x 2)     Status: None (Preliminary result)   Collection Time: 08/23/17  5:16 PM  Result Value Ref Range Status   Specimen Description   Final    BLOOD LEFT ANTECUBITAL Performed at [facility] [address]  [place], [place] [address]    Special Requests   Final    BOTTLES DRAWN AEROBIC AND ANAEROBIC Blood Culture adequate volume Performed at [facility] [facility] [address], [place], [place] [address]    Culture   Final    NO GROWTH 4 DAYS Performed at [facility], [place] [address], [place], [place] [address]    Report Status PENDING  Incomplete  Respiratory Panel by PCR     Status: None   Collection Time: 08/23/17  9:39 PM  Result Value Ref Range Status   Adenovirus NOT DETECTED NOT DETECTED Final   Coronavirus 229E NOT DETECTED NOT DETECTED Final   Coronavirus HKU1 NOT DETECTED NOT DETECTED Final   Coronavirus NL63 NOT DETECTED NOT DETECTED Final   Coronavirus OC43 NOT DETECTED NOT DETECTED Final   Metapneumovirus NOT DETECTED NOT DETECTED Final   Rhinovirus /  Enterovirus NOT DETECTED NOT DETECTED Final   Influenza A NOT DETECTED NOT DETECTED Final   Influenza A H1 NOT DETECTED NOT DETECTED Final   Influenza A H1 2009 NOT DETECTED NOT DETECTED Final   Influenza A H3 NOT DETECTED NOT DETECTED Final   Influenza B NOT DETECTED NOT DETECTED Final   Parainfluenza Virus 1 NOT DETECTED NOT DETECTED Final   Parainfluenza Virus 2 NOT DETECTED NOT DETECTED Final   Parainfluenza Virus 3 NOT DETECTED NOT DETECTED Final   Parainfluenza Virus 4 NOT DETECTED NOT DETECTED Final   Respiratory Syncytial Virus NOT DETECTED NOT DETECTED Final   Bordetella pertussis NOT DETECTED NOT DETECTED Final   Chlamydophila pneumoniae NOT DETECTED NOT DETECTED Final   Mycoplasma pneumoniae NOT DETECTED NOT DETECTED Final    Comment: Performed at [facility], [place] [address], [place], [place] [address]  Culture, sputum-assessment     Status: None   Collection Time: 08/23/17 10:54 PM  Result Value Ref Range Status   Specimen Description EXPECTORATED SPUTUM  Final   Special Requests Normal  Final   Sputum evaluation   Final    Sputum specimen not acceptable for testing.  Please recollect.   RESULT CALLED TO, READ BACK BY AND VERIFIED WITH: Erle Crocker [id number] @ 3086 BY J SCOTTON Performed at [facility], [place] [address] [place], [place] [address]    Report Status 08/23/2017 FINAL  Final     Time coordinating discharge: Over 30 minutes  SIGNED:   Damita Lack, MD  [facility] 08/27/2017, 10:12 AM Pager   If 7PM-7AM, please contact night-coverage [email or web address] Password [email or web address]

## 2017-08-27 NOTE — Progress Notes (Signed)
LCSW following for SNF placecment.  Patient and family gave permission for LCSw for fax patient out to local facilities.   LCSW will continue to follow.   Carolin Coy [place]

## 2017-08-27 NOTE — Progress Notes (Signed)
Rapid Response Event Note  Overview:   RR- RN called to room 1504 for patient moaning and yelling in pain, stating he had sharp pain, and clutching his hands over the left side of his chest.    Initial Focused Assessment: Pt moaning, unable to tell me where his pain is or what his pain rating is. RN performed 12-lead EKG. S1, S2 heard. Pt placed on 2LNC due to having pain in his chest.   Interventions: EKG called to NP Ativan had been given by RN and patient now seems to be more calm now (no longer yelling and now communicating with RR-RN), stated the pain he felt was like a "lightening strike" on the left side of his chest. Pt does have a demand PM per the RN.  Pt currently on telemetry unit and per telemetry tech patient PM had been firing prior to the onset of pain.   Plan of Care (if not transferred): NP ordered Troponin to be checked and RN to perform repeat EKG once patient is less restless. RN to contact RR-RN for further questions or concerns.   Event Summary: VS documented in flowsheet.   Andrew Norton

## 2017-08-27 NOTE — Progress Notes (Signed)
[id number] Davis,BSN,RN3,CCM: [phone]/[id number] [facility] notified of need for services

## 2017-08-27 NOTE — Discharge Summary (Signed)
Physician Discharge Summary  Andrew Norton MLY:[id number] DOB: 06/15/1927 DOA: 08/23/2017  PCP: Laurey Morale, MD  Admit date: 08/23/2017 Discharge date: 08/27/2017  Admitted From:  Disposition:   Recommendations for Outpatient Follow-up:  1. Follow up with PCP in 1-2 weeks 2. Follow-up with cardiology, Dr Curt Bears in about 2 weeks 3. Take Vantin orally for 5 days azithromycin orally for 3 days 4. Albuterol has been prescribed if necessary 5. Metoprolol dose has been reduced to 12.5 mg orally. 6. Arrangements for home physical therapy, RN and aide has been made.   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  Patient had failed outpatient therapy with azithromycin.  Chest x-ray showed right middle and lower lobe infiltrate.  He was started on Rocephin and azithromycin which he did well.  He was weaned off of oxygen.  He was seen by physical therapy who recommended home health PT.  During his stay he had a brief episode of agitation and confusion likely secondary to delirium and was easily redirectable.  Also had one episode of bradycardia subsequently causing his pacemaker to kick in and he had atrial paced rhythm.  His metoprolol was decreased to 12.5 mg.  At the time of discharge his heart rate was back up again to 70s with intrinsic rhythm. Today he is medically stable and has reached maximum benefit from hospital stay.  He is ready to be discharged with outpatient follow-up recommendations as stated above.  No complaints today.  Overnight had episode of agitation and concerns for anxiety with "pacemaker firing"therefore rapid response was called.  No complaints this morning  otherwise and he is back to his baseline.   Discharge Diagnoses:  Principal Problem:   CAP (community acquired pneumonia) Active Problems:   Anemia   Diabetes mellitus type 2 with complications (HCC)   Hyponatremia   Abnormal liver function  Right lower lobe community-acquired pneumonia Acute respiratory distress which is improved Currently he is weaned off of oxygen doing well on nasal cannula We will transition his IV antibiotics to oral Vantin for 5 days and will also give him 3 more days of azithromycin to cover for any atypicals -Continue to provide supportive care -Respiratory panel and urine strep is negative -Urine Legionella has been negative as well  Intermittent confusion Acute delirium with a history of dementia -His mentation currently is at baseline.  We will not pursue further workup at this time, he can follow-up outpatient.  Tachybradycardia syndrome Paroxysmal atrial fibrillation - He currently has a [employer]onic dual-chamber pacemaker in place. - Due to events of bradycardia overnight, his metoprolol dose has been decreased to 12.5 mg -Continue rest of the medications, follow-up with outpatient cardiology -Continue Xarelto  Urinary urgency and intermittent incontinence Benign prostate hyperplasia -He is already on finasteride, Flomax has been added -UA is negative at the time of admission  Transaminitis -Right upper quadrant showed slightly contracted gallbladder with dilated CBD at 9.8 mm but this has decreased in size since 2017 from 13 mm.  LFTs are normal.  No further inpatient workup indicated  Anemia chronic disease -Hemoglobin stable without any signs of bleeding, continue outpatient ferrous sulfate  Diabetes type 2 -Continue home medication  Discharge Instructions   Allergies as of 08/27/2017  Reactions   Penicillins Hives   Has patient had a PCN reaction causing immediate rash, facial/tongue/throat swelling, SOB or lightheadedness with  hypotension: Yes Has patient had a PCN reaction causing severe rash involving mucus membranes or skin necrosis: No Has patient had a PCN reaction that required hospitalization: Yes Has patient had a PCN reaction occurring within the last 10 years:No If all of the above answers are "NO", then may proceed with Cephalosporin use.   Ppd [tuberculin Purified Protein Derivative]       Medication List    TAKE these medications   albuterol 108 (90 Base) MCG/ACT inhaler Commonly known as:  PROVENTIL HFA;VENTOLIN HFA Inhale 2 puffs into the lungs every 6 (six) hours as needed for wheezing or shortness of breath.   azithromycin 500 MG tablet Commonly known as:  ZITHROMAX Take 1 tablet (500 mg total) by mouth daily for 3 days. Take 1 tablet daily for 3 days. What changed:    medication strength  how much to take  how to take this  when to take this  additional instructions   cefpodoxime 100 MG tablet Commonly known as:  VANTIN Take 1 tablet (100 mg total) by mouth 2 (two) times daily for 5 days.   cyanocobalamin 1000 MCG/ML injection Commonly known as:  (VITAMIN B-12) Inject 1,000 mcg into the muscle every 30 (thirty) days.   cyclobenzaprine 10 MG tablet Commonly known as:  FLEXERIL TAKE 1 TABLET 3 TIMES DAILYAS NEEDED FOR MUSCLE SPASMS   Ferrous Sulfate 220 (44 Fe) MG/5ML Liqd Take 7 mLs by mouth daily. TAKE 7 MLS BY MOUTH DAILY   finasteride 5 MG tablet Commonly known as:  PROSCAR Take 1 tablet (5 mg total) by mouth daily.   flecainide 50 MG tablet Commonly known as:  TAMBOCOR Take 1 tablet (50 mg total) by mouth every 12 (twelve) hours.   gabapentin 400 MG capsule Commonly known as:  NEURONTIN TAKE 1 CAPSULE 4 TIMES     DAILY   glipiZIDE 10 MG tablet Commonly known as:  GLUCOTROL Take 0.5 tablets (5 mg total) by mouth 2 (two) times daily. What changed:  Another medication with the same name was removed. Continue taking this medication, and follow the directions you  see here.   HYDROcodone-acetaminophen 5-325 MG tablet Commonly known as:  NORCO/VICODIN Take 1 tablet by mouth every 6 (six) hours as needed for moderate pain.   lidocaine 5 % Commonly known as:  LIDODERM Place 1 patch onto the skin daily. Remove & Discard patch within 12 hours or as directed by MD   losartan 50 MG tablet Commonly known as:  COZAAR TAKE 1 TABLET EVERY MORNING   metFORMIN 1000 MG tablet Commonly known as:  GLUCOPHAGE TAKE 1 TABLET TWICE A DAY  WITH MEALS   metoprolol tartrate 25 MG tablet Commonly known as:  LOPRESSOR Take 0.5 tablets (12.5 mg total) by mouth 2 (two) times daily. What changed:  how much to take   NUTRITIONAL SUPPLEMENT Liqd Take 120 mLs by mouth 2 (two) times daily. SF MedPass for nutritional support   polyethylene glycol powder powder Commonly known as:  GLYCOLAX/MIRALAX Take 17 g by mouth daily. What changed:    when to take this  reasons to take this   rivaroxaban 20 MG Tabs tablet Commonly known as:  XARELTO Take 1 tablet (20 mg total) by mouth daily with supper.   sitaGLIPtin 100 MG tablet Commonly known as:  JANUVIA Take 1 tablet (100 mg total) by mouth daily.  tamsulosin 0.4 MG Caps capsule Commonly known as:  FLOMAX Take 1 capsule (0.4 mg total) by mouth daily after supper.   TRUE METRIX BLOOD GLUCOSE TEST VI by In Vitro route 2 (two) times daily. Diagnosis code is E 11.9       Allergies  Allergen Reactions  . Penicillins Hives    Has patient had a PCN reaction causing immediate rash, facial/tongue/throat swelling, SOB or lightheadedness with hypotension: Yes Has patient had a PCN reaction causing severe rash involving mucus membranes or skin necrosis: No Has patient had a PCN reaction that required hospitalization: Yes Has patient had a PCN reaction occurring within the last 10 years:No If all of the above answers are "NO", then may proceed with Cephalosporin use.   Marland Kitchen Ppd [Tuberculin Purified Protein Derivative]      On your next visit with your primary care physician please Get Medicines reviewed and adjusted.   Please request your Prim.MD to go over all Hospital Tests and Procedure/Radiological results at the follow up, please get all Hospital records sent to your Prim MD by signing hospital release before you go home.   If you experience worsening of your admission symptoms, develop shortness of breath, life threatening emergency, suicidal or homicidal thoughts you must seek medical attention immediately by calling 911 or calling your MD immediately  if symptoms less severe.  You Must read complete instructions/literature along with all the possible adverse reactions/side effects for all the Medicines you take and that have been prescribed to you. Take any new Medicines after you have completely understood and accpet all the possible adverse reactions/side effects.   Do not drive, operate heavy machinery, perform activities at heights, swimming or participation in water activities or provide baby sitting services if your were admitted for syncope or siezures until you have seen by Primary MD or a Neurologist and advised to do so again.  Do not drive when taking Pain medications.    Do not take more than prescribed Pain, Sleep and Anxiety Medications  Special Instructions: If you have smoked or chewed Tobacco  in the last 2 yrs please stop smoking, stop any regular Alcohol  and or any Recreational drug use.  Wear Seat belts while driving.   Please note  You were cared for by a hospitalist during your hospital stay. If you have any questions about your discharge medications or the care you received while you were in the hospital after you are discharged, you can call the unit and asked to speak with the hospitalist on call if the hospitalist that took care of you is not available. Once you are discharged, your primary care physician will handle any further medical issues. Please note  that NO REFILLS for any discharge medications will be authorized once you are discharged, as it is imperative that you return to your primary care physician (or establish a relationship with a primary care physician if you do not have one) for your aftercare needs so that they can reassess your need for medications and monitor your lab values.   Increase activity slowly        Consultations:  PT   Procedures/Studies: Dg Chest 2 View  Result Date: 08/23/2017 CLINICAL DATA:  Pneumonia. EXAM: CHEST  2 VIEW COMPARISON:  Radiographs of August 21, 2017. FINDINGS: The heart size and mediastinal contours are within normal limits. Atherosclerosis of thoracic aorta is noted. Left-sided pacemaker is unchanged in position. No pneumothorax or pleural effusion is noted. Stable diffuse reticular densities are  noted throughout both lungs consistent with chronic interstitial lung disease or scarring. Stable right basilar opacity is noted concerning for pneumonia. The visualized skeletal structures are unremarkable. IMPRESSION: Aortic atherosclerosis. Stable right basilar opacity is noted most consistent with pneumonia. Followup PA and lateral chest X-ray is recommended in 3-4 weeks following trial of antibiotic therapy to ensure resolution and exclude underlying malignancy. Electronically Signed   By: Marijo Conception, M.D.   On: 08/23/2017 16:33   Dg Chest 2 View  Result Date: 08/22/2017 CLINICAL DATA:  Cough and congestion. EXAM: CHEST  2 VIEW COMPARISON:  05/29/2016 FINDINGS: There is a left chest wall pacer device identified with leads in the right atrial appendage and right ventricle. Normal heart size. Aortic atherosclerosis. The lungs are hyperinflated. Airspace consolidation within the right lower lobe and right middle lobe is identified compatible with pneumonia. This is on a background of diffuse peripheral predominant reticular interstitial opacities. Flowing ventral syndesmophytes are identified  throughout the thoracic spine. IMPRESSION: 1. Right middle lobe and right lower lobe pneumonia. 2. Bilateral peripheral predominant interstitial reticulation suggestive of chronic interstitial lung disease. Suggest follow-up imaging with high-resolution CT of the chest. 3.  Aortic Atherosclerosis (ICD10-I70.0). Electronically Signed   By: Kerby Moors M.D.   On: 08/22/2017 08:35   US Abdomen Limited Ruq  Result Date: 08/24/2017 CLINICAL DATA:  Abnormal liver function EXAM: ULTRASOUND ABDOMEN LIMITED RIGHT UPPER QUADRANT COMPARISON:  Ultrasound 05/23/2016 FINDINGS: Gallbladder: Slightly contracted. No shadowing stones. Borderline wall thickening at 3.2 mm. Negative sonographic Murphy. Common bile duct: Diameter: Enlarged measuring up to 9.8 mm, but decreased compared to previous ultrasound at which time the bile duct measured 13 mm. Liver: No focal lesion identified. Within normal limits in parenchymal echogenicity. Portal vein is patent on color Doppler imaging with normal direction of blood flow towards the liver. IMPRESSION: 1. Slightly contracted gallbladder. Borderline wall thickness which may be partially related to contraction. Negative for stone disease 2. Enlarged common bile duct measuring up to 9.8 mm. This is decreased compared to the 2017 ultrasound at which time bile duct measured 13 mm. Electronically Signed   By: Donavan Foil M.D.   On: 08/24/2017 02:08      Subjective:  Episode of acute confusion and delirium overnight. Concerns that patient had A paced rhythm therefore RRT was called. This morning he has back to his baseline, HR is around 70s.   Discharge Exam: Vitals:   08/26/17 2225 08/27/17 0725  BP: 135/84   Pulse: 74   Resp: 20   Temp:    SpO2: 100% 97%   Vitals:   08/26/17 2215 08/26/17 2220 08/26/17 2225 08/27/17 0725  BP: (!) 144/67 (!) 157/63 135/84   Pulse: 77 75 74   Resp: 18 20 20    Temp:      TempSrc:      SpO2:  99% 100% 97%  Weight:      Height:         General: Pt is alert, awake, not in acute distress Cardiovascular: RRR, S1/S2 +, no rubs, no gallops Respiratory: CTA bilaterally, no wheezing, no rhonchi Abdominal: Soft, NT, ND, bowel sounds + Extremities: no edema, no cyanosis    The results of significant diagnostics from this hospitalization (including imaging, microbiology, ancillary and laboratory) are listed below for reference.     Microbiology: Recent Results (from the past 240 hour(s))  Blood culture (routine x 2)     Status: None (Preliminary result)   Collection Time: 08/23/17  5:13  PM  Result Value Ref Range Status   Specimen Description   Final    BLOOD LEFT HAND Performed at [facility], [place] [address] [place], [place] [address]    Special Requests   Final    IN PEDIATRIC BOTTLE Blood Culture adequate volume Performed at [place] [address], [place], [place] [address]    Culture   Final    NO GROWTH 4 DAYS Performed at [facility], [place] [address] [place], [place] [address]    Report Status PENDING  Incomplete  Blood culture (routine x 2)     Status: None (Preliminary result)   Collection Time: 08/23/17  5:16 PM  Result Value Ref Range Status   Specimen Description   Final    BLOOD LEFT ANTECUBITAL Performed at [facility] [address], [place], [place] [address]    Special Requests   Final    BOTTLES DRAWN AEROBIC AND ANAEROBIC Blood Culture adequate volume Performed at [facility] [address] [place], [place] [address]    Culture   Final    NO GROWTH 4 DAYS Performed at [facility], [place] [address], [place], [place] [address]    Report Status PENDING  Incomplete  Respiratory Panel by PCR     Status: None   Collection Time: 08/23/17  9:39 PM  Result Value Ref Range Status   Adenovirus NOT DETECTED NOT DETECTED Final   Coronavirus 229E NOT DETECTED NOT DETECTED Final   Coronavirus HKU1 NOT DETECTED  NOT DETECTED Final   Coronavirus NL63 NOT DETECTED NOT DETECTED Final   Coronavirus OC43 NOT DETECTED NOT DETECTED Final   Metapneumovirus NOT DETECTED NOT DETECTED Final   Rhinovirus / Enterovirus NOT DETECTED NOT DETECTED Final   Influenza A NOT DETECTED NOT DETECTED Final   Influenza A H1 NOT DETECTED NOT DETECTED Final   Influenza A H1 2009 NOT DETECTED NOT DETECTED Final   Influenza A H3 NOT DETECTED NOT DETECTED Final   Influenza B NOT DETECTED NOT DETECTED Final   Parainfluenza Virus 1 NOT DETECTED NOT DETECTED Final   Parainfluenza Virus 2 NOT DETECTED NOT DETECTED Final   Parainfluenza Virus 3 NOT DETECTED NOT DETECTED Final   Parainfluenza Virus 4 NOT DETECTED NOT DETECTED Final   Respiratory Syncytial Virus NOT DETECTED NOT DETECTED Final   Bordetella pertussis NOT DETECTED NOT DETECTED Final   Chlamydophila pneumoniae NOT DETECTED NOT DETECTED Final   Mycoplasma pneumoniae NOT DETECTED NOT DETECTED Final    Comment: Performed at [facility], [place] [address] [place], [place] [address]  Culture, sputum-assessment     Status: None   Collection Time: 08/23/17 10:54 PM  Result Value Ref Range Status   Specimen Description EXPECTORATED SPUTUM  Final   Special Requests Normal  Final   Sputum evaluation   Final    Sputum specimen not acceptable for testing.  Please recollect.   RESULT CALLED TO, READ BACK BY AND VERIFIED WITH: Erle Crocker [id number] @ 3536 BY J SCOTTON Performed at [facility], [place] [address], [place], [place] [address]    Report Status 08/23/2017 FINAL  Final     Labs: BNP (last 3 results) No results for input(s): BNP in the last 8760 hours. Basic Metabolic Panel: Recent Labs  Lab 08/21/17 1342 08/23/17 1607 08/26/17 0602 08/27/17 0855  NA 130* 129* 130* 127*  K 4.5 4.2 4.2 4.5  CL 96 95* 98*  96*  CO2 23 22 21* 21*  GLUCOSE 196* 77 181* 179*  BUN 27* 21* 12 14  CREATININE 0.97 0.91 0.64 0.59*  CALCIUM 8.4 8.5* 8.1*  8.4*  MG  --   --  1.3* 1.5*   Liver Function Tests: Recent Labs  Lab 08/21/17 1342 08/23/17 1607 08/26/17 0602 08/27/17 0855  AST 36 58* 29 25  ALT 32 48 37 32  ALKPHOS 162* 212* 156* 145*  BILITOT 1.0 0.4 0.7 0.5  PROT 6.8 7.6 6.3* 6.5  ALBUMIN 3.2* 3.0* 2.5* 2.5*   No results for input(s): LIPASE, AMYLASE in the last 168 hours. No results for input(s): AMMONIA in the last 168 hours. CBC: Recent Labs  Lab 08/21/17 1342 08/23/17 1607 08/24/17 0646 08/26/17 0602 08/27/17 0855  WBC 13.7* 10.0 7.8 8.2 7.1  NEUTROABS 12.0* 7.6  --   --   --   HGB 9.7* 9.1* 8.1* 8.0* 8.4*  HCT 29.1* 27.3* 24.0* 22.9* 23.9*  MCV 91.3 89.2 89.6 88.4 88.5  PLT 326.0 343 310 302 289   Cardiac Enzymes: Recent Labs  Lab 08/26/17 2242  TROPONINI <0.03   BNP: Invalid input(s): POCBNP CBG: Recent Labs  Lab 08/26/17 0738 08/26/17 1208 08/26/17 1557 08/26/17 2205 08/27/17 0811  GLUCAP 164* 251* 229* 171* 149*   D-Dimer No results for input(s): DDIMER in the last 72 hours. Hgb A1c No results for input(s): HGBA1C in the last 72 hours. Lipid Profile No results for input(s): CHOL, HDL, LDLCALC, TRIG, CHOLHDL, LDLDIRECT in the last 72 hours. Thyroid function studies No results for input(s): TSH, T4TOTAL, T3FREE, THYROIDAB in the last 72 hours.  Invalid input(s): FREET3 Anemia work up No results for input(s): VITAMINB12, FOLATE, FERRITIN, TIBC, IRON, RETICCTPCT in the last 72 hours. Urinalysis    Component Value Date/Time   COLORURINE YELLOW 08/23/2017 [place] 08/23/2017 1614   LABSPEC 1.015 08/23/2017 1614   PHURINE 5.0 08/23/2017 1614   GLUCOSEU NEGATIVE 08/23/2017 1614   HGBUR NEGATIVE 08/23/2017 1614   HGBUR negative 01/12/2008 0000   BILIRUBINUR NEGATIVE 08/23/2017 1614   BILIRUBINUR 1+ 08/21/2017 1651   KETONESUR NEGATIVE 08/23/2017 1614   PROTEINUR NEGATIVE 08/23/2017 1614   UROBILINOGEN 1.0 08/21/2017 1651   UROBILINOGEN 1.0 04/13/2015 0022    NITRITE NEGATIVE 08/23/2017 1614   LEUKOCYTESUR NEGATIVE 08/23/2017 1614   Sepsis Labs Invalid input(s): PROCALCITONIN,  WBC,  LACTICIDVEN Microbiology Recent Results (from the past 240 hour(s))  Blood culture (routine x 2)     Status: None (Preliminary result)   Collection Time: 08/23/17  5:13 PM  Result Value Ref Range Status   Specimen Description   Final    BLOOD LEFT HAND Performed at [facility], [place] [address], [place], [place] [address]    Special Requests   Final    IN PEDIATRIC BOTTLE Blood Culture adequate volume Performed at [facility] [address], [place], [place] [address]    Culture   Final    NO GROWTH 4 DAYS Performed at [facility], [place] [address] [place], [place] [address]    Report Status PENDING  Incomplete  Blood culture (routine x 2)     Status: None (Preliminary result)   Collection Time: 08/23/17  5:16 PM  Result Value Ref Range Status   Specimen Description   Final    BLOOD LEFT ANTECUBITAL Performed at [facility] [address], [place],  [address]    Special Requests  Final    BOTTLES DRAWN AEROBIC AND ANAEROBIC Blood Culture adequate volume Performed at [facility] [address], [place], [place] [address]    Culture   Final    NO GROWTH 4 DAYS Performed at [facility], [place] [address] [place], [place] [address]    Report Status PENDING  Incomplete  Respiratory Panel by PCR     Status: None   Collection Time: 08/23/17  9:39 PM  Result Value Ref Range Status   Adenovirus NOT DETECTED NOT DETECTED Final   Coronavirus 229E NOT DETECTED NOT DETECTED Final   Coronavirus HKU1 NOT DETECTED NOT DETECTED Final   Coronavirus NL63 NOT DETECTED NOT DETECTED Final   Coronavirus OC43 NOT DETECTED NOT DETECTED Final   Metapneumovirus NOT DETECTED NOT DETECTED Final   Rhinovirus / Enterovirus NOT DETECTED NOT DETECTED Final   Influenza A NOT DETECTED NOT  DETECTED Final   Influenza A H1 NOT DETECTED NOT DETECTED Final   Influenza A H1 2009 NOT DETECTED NOT DETECTED Final   Influenza A H3 NOT DETECTED NOT DETECTED Final   Influenza B NOT DETECTED NOT DETECTED Final   Parainfluenza Virus 1 NOT DETECTED NOT DETECTED Final   Parainfluenza Virus 2 NOT DETECTED NOT DETECTED Final   Parainfluenza Virus 3 NOT DETECTED NOT DETECTED Final   Parainfluenza Virus 4 NOT DETECTED NOT DETECTED Final   Respiratory Syncytial Virus NOT DETECTED NOT DETECTED Final   Bordetella pertussis NOT DETECTED NOT DETECTED Final   Chlamydophila pneumoniae NOT DETECTED NOT DETECTED Final   Mycoplasma pneumoniae NOT DETECTED NOT DETECTED Final    Comment: Performed at [facility], [place] [address], [place], [place] [address]  Culture, sputum-assessment     Status: None   Collection Time: 08/23/17 10:54 PM  Result Value Ref Range Status   Specimen Description EXPECTORATED SPUTUM  Final   Special Requests Normal  Final   Sputum evaluation   Final    Sputum specimen not acceptable for testing.  Please recollect.   RESULT CALLED TO, READ BACK BY AND VERIFIED WITH: Erle Crocker [id number] @ 9892 BY J SCOTTON Performed at [facility], [place] [address], [place], [place] [address]    Report Status 08/23/2017 FINAL  Final     Time coordinating discharge: Over 30 minutes  SIGNED:   Damita Lack, MD  [facility] 08/27/2017, 10:12 AM Pager   If 7PM-7AM, please contact night-coverage [email or web address] Password [email or web address]

## 2017-08-27 NOTE — NC FL2 (Signed)
[place] LEVEL OF CARE SCREENING TOOL     IDENTIFICATION  Patient Name: Andrew Norton Birthdate: 1927/03/14 Sex: male Admission Date (Current Location): 08/23/2017  [facility] and [place] Number:  Herbalist and Address:  [facility],  [place] [address], [place]      Provider Number: [id number]  Attending Physician Name and Address:  Damita Lack, MD  Relative Name and Phone Number:       Current Level of Care: Hospital Recommended Level of Care: [place] Prior Approval Number:    Date Approved/Denied: 08/27/17 PASRR Number: [id number]  Discharge Plan: SNF    Current Diagnoses: Patient Active Problem List   Diagnosis Date Noted  . Hyponatremia 08/23/2017  . CAP (community acquired pneumonia) 08/23/2017  . Abnormal liver function   . Neuropathy 05/31/2016  . Gallbladder & bile duct stone with obstruction   . Tachy-brady syndrome ([place]) 05/21/2016  . Sinus pause 05/21/2016  . NSVT (nonsustained ventricular tachycardia) ([place]) 05/21/2016  . Bile duct obstruction   . Abnormal CT of the abdomen   . Paroxysmal atrial fibrillation ([place]) 05/18/2016  . Protein-calorie malnutrition, severe 05/18/2016  . Jaundice 05/17/2016  . Diabetes mellitus type 2, noninsulin dependent ([place]) 05/17/2016  . LFT elevation 05/17/2016  . Leukocytosis 05/17/2016  . 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 Constipation 07/18/2015  . Aspiration pneumonia ([place]) 04/12/2015  . Sepsis ([place]) 04/12/2015  . Hyperlipidemia 04/12/2015  . Diabetes mellitus type 2 with complications ([place]) 26/71/2458  . Lobar pneumonia due to unspecified organism 04/12/2015  . Cervical myelopathy ([place]) 11/10/2013  . Cervical disc disorder with radiculopathy of cervical region 07/01/2013  . Abnormality of gait 07/01/2013  . Unspecified arthropathy, pelvic region and thigh 11/27/2012  . Type 2 diabetes mellitus without complication, without long-term current use of  insulin ([place]) 11/27/2012  . Unspecified hereditary and idiopathic peripheral neuropathy 11/27/2012  . Acute posthemorrhagic anemia 11/27/2012  . Pain with hip hemiarthroplasty ([place]) 11/05/2012  . HYPERKALEMIA 10/26/2009  . Vitamin B12 deficiency 09/27/2009  . CARPAL TUNNEL SYNDROME 09/27/2009  . 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 CHEST PAIN 12/01/2008  . NECK PAIN, CHRONIC 07/01/2008  . METHYLMALONIC ACIDEMIA 02/09/2008  . ANXIETY STATE, UNSPECIFIED 12/29/2007  . LEG CRAMPS, NOCTURNAL 12/29/2007  . SHINGLES 12/15/2007  . HEARING LOSS 08/11/2007  . DYSFUNCTION OF EUSTACHIAN TUBE 08/04/2007  . BACK PAIN, LUMBAR 07/30/2007  . Essential hypertension 07/21/2007  . ACUTE BRONCHITIS 07/21/2007  . Hyperlipidemia associated with type 2 diabetes mellitus ([place]) 05/12/2007  . Disorder resulting from impaired renal function 05/12/2007  . Anemia 12/11/2006  . HAY FEVER 12/11/2006  . PAGET'S DISEASE 12/11/2006  . TB SKIN TEST, POSITIVE 12/11/2006    Orientation RESPIRATION BLADDER Height & Weight     Self  Normal Incontinent Weight: 157 lb 6.4 oz (71.4 kg) Height:  5\' 10"  (177.8 cm)  BEHAVIORAL SYMPTOMS/MOOD NEUROLOGICAL BOWEL NUTRITION STATUS      Continent Diet(see dc summary)  AMBULATORY STATUS COMMUNICATION OF NEEDS Skin   Extensive Assist Verbally Normal                       Personal Care Assistance Level of Assistance  Bathing, Feeding, Dressing Bathing Assistance: Limited assistance Feeding assistance: Independent Dressing Assistance: Limited assistance     Functional Limitations Info  Sight, Hearing, Speech Sight Info: Adequate Hearing Info: Adequate Speech Info: Adequate    SPECIAL CARE FACTORS FREQUENCY  PT (By licensed PT), OT (  By licensed OT)     PT Frequency: 5x/week OT Frequency: 5x/week            Contractures Contractures Info: Not present    Additional Factors Info  Code Status, Allergies Code Status Info: Full Allergies Info:  Penicillins, Ppd Tuberculin Purified Protein Derivative           Current Medications (08/27/2017):  This is the current hospital active medication list Current Facility-Administered Medications  Medication Dose Route Frequency Provider Last Rate Last Dose  . azithromycin (ZITHROMAX) tablet 500 mg  500 mg Oral Daily Luiz Ochoa, RPH   500 mg at 08/26/17 1803  . cefTRIAXone (ROCEPHIN) 1 g in sodium chloride 0.9 % 100 mL IVPB  1 g Intravenous Q24H Amin, Ankit Chirag, MD 200 mL/hr at 08/26/17 1753 1 g at 08/26/17 1753  . cyclobenzaprine (FLEXERIL) tablet 10 mg  10 mg Oral TID PRN Jani Gravel, MD   10 mg at 08/27/17 1127  . feeding supplement (ENSURE ENLIVE) (ENSURE ENLIVE) liquid 120 mL  120 mL Oral BID BM Jani Gravel, MD   120 mL at 08/27/17 1136  . ferrous sulfate 300 (60 Fe) MG/5ML syrup 300 mg  300 mg Oral Daily Jani Gravel, MD   300 mg at 08/27/17 1124  . finasteride (PROSCAR) tablet 5 mg  5 mg Oral Daily Jani Gravel, MD   5 mg at 08/27/17 1126  . flecainide (TAMBOCOR) tablet 50 mg  50 mg Oral Q12H Jani Gravel, MD   50 mg at 08/27/17 1136  . gabapentin (NEURONTIN) capsule 400 mg  400 mg Oral QID Jani Gravel, MD   400 mg at 08/27/17 1404  . guaiFENesin (ROBITUSSIN) 100 MG/5ML solution 100 mg  5 mL Oral Q4H PRN Amin, Ankit Chirag, MD   100 mg at 08/27/17 1127  . hydrALAZINE (APRESOLINE) injection 5 mg  5 mg Intravenous Q6H PRN Jani Gravel, MD      . HYDROcodone-acetaminophen (NORCO/VICODIN) 5-325 MG per tablet 1 tablet  1 tablet Oral Q6H PRN Jani Gravel, MD   1 tablet at 08/27/17 1127  . insulin aspart (novoLOG) injection 0-5 Units  0-5 Units Subcutaneous QHS Jani Gravel, MD      . insulin aspart (novoLOG) injection 0-9 Units  0-9 Units Subcutaneous TID WC Jani Gravel, MD   3 Units at 08/27/17 1405  . ipratropium-albuterol (DUONEB) 0.5-2.5 (3) MG/3ML nebulizer solution 3 mL  3 mL Nebulization BID Amin, Ankit Chirag, MD   3 mL at 08/27/17 0724  . linagliptin (TRADJENTA) tablet 5 mg  5 mg Oral Daily  Jani Gravel, MD   5 mg at 08/27/17 1124  . losartan (COZAAR) tablet 50 mg  50 mg Oral q morning - 10a Jani Gravel, MD   50 mg at 08/27/17 1126  . metFORMIN (GLUCOPHAGE) tablet 1,000 mg  1,000 mg Oral BID WC Jani Gravel, MD   1,000 mg at 08/27/17 1125  . metoprolol tartrate (LOPRESSOR) tablet 25 mg  25 mg Oral BID Jani Gravel, MD   25 mg at 08/27/17 1126  . polyethylene glycol (MIRALAX / GLYCOLAX) packet 17 g  17 g Oral Daily PRN Minda Ditto, RPH      . rivaroxaban (XARELTO) tablet 20 mg  20 mg Oral Q supper Jani Gravel, MD   20 mg at 08/26/17 1753  . tamsulosin (FLOMAX) capsule 0.4 mg  0.4 mg Oral QPC supper Damita Lack, MD   0.4 mg at 08/26/17 1752     Discharge Medications: Please  see discharge summary for a list of discharge medications.  Relevant Imaging Results:  Relevant Lab Results:   Additional Information ss#[id number].  Patient has permanent pacemaker.  Servando Snare, LCSW

## 2017-08-27 NOTE — Care Management Important Message (Signed)
Important Message  Patient Details  Name: ADEWALE PUCILLO MRN: [id number] Date of Birth: [birth year]   Medicare Important Message Given:  Yes    Kerin Salen 08/27/2017, 12:31 [place] Message  Patient Details  Name: RAHEEN CAPILI MRN: [id number] Date of Birth: 03/23/1927   Medicare Important Message Given:  Yes    Kerin Salen 08/27/2017, 12:30 PM

## 2017-08-27 NOTE — Progress Notes (Addendum)
[id number] Latessa Tillis,BSN,RN3,CCM:[phone]/[facility] csw/notified of need for csw. [facility]-pt needs to change from [facility] to snf placement.

## 2017-08-27 NOTE — Progress Notes (Signed)
Physical Therapy Treatment Patient Details Name: Andrew Norton MRN: [id number] DOB: [birth year] Today's Date: 08/27/2017    History of Present Illness 84 year old male with a history of hypertension, hyperlipidemia, paroxysmal atrial fibrillation, diabetes type 2 came to the hospital after being sent by his PCP for treatment of pneumonia.  Patient failed outpatient treatment of pneumonia and the chest x-ray showed right middle and lower lobe infiltrate.    PT Comments    Asked to see pt again on today. Family is requesting placement. Also noted rapid response event and increased confusion on yesterday evening. Will update d/c plan recommendation to SNF. Pt participated well on today. He is still confused: A&O x 1. Will continue to follow during hospital stay.     Follow Up Recommendations  SNF     Equipment Recommendations  None recommended by PT    Recommendations for Other Services       Precautions / Restrictions Precautions Precautions: Fall Restrictions Weight Bearing Restrictions: No    Mobility  Bed Mobility               General bed mobility comments: oob in recliner  Transfers Overall transfer level: Needs assistance Equipment used: Rolling walker (2 wheeled) Transfers: Sit to/from Stand Sit to Stand: Min assist         General transfer comment: Assist to rise, stabilize. VCs safety, hand placement. Pt was very unsteady and required assistance to balance on 1st attempt. On 2nd attempt, he was Min guard asisst.   Ambulation/Gait   Ambulation Distance (Feet): 100 Feet(100'x1, 75'x1) Assistive device: Rolling walker (2 wheeled) Gait Pattern/deviations: Step-through pattern;Decreased stride length     General Gait Details: close guard for safety. slow gait speed. Pt reported mild lightheadednesss on today.    Stairs            Wheelchair Mobility    Modified Rankin (Stroke Patients Only)       Balance Overall balance assessment: Needs  assistance         Standing balance support: Bilateral upper extremity supported Standing balance-Leahy Scale: Poor                              Cognition Arousal/Alertness: Awake/alert Behavior During Therapy: WFL for tasks assessed/performed Overall Cognitive Status: No family/caregiver present to determine baseline cognitive functioning Area of Impairment: Orientation;Following commands;Safety/judgement                 Orientation Level: Disoriented to;Place;Time;Situation     Following Commands: Follows multi-step commands consistently Safety/Judgement: Decreased awareness of safety            Exercises      General Comments        Pertinent Vitals/Pain Pain Assessment: No/denies pain    Home Living                      Prior Function            PT Goals (current goals can now be found in the care plan section) Progress towards PT goals: Progressing toward goals    Frequency    Min 3X/week      PT Plan Discharge plan needs to be updated    Co-evaluation              AM-PAC PT "6 Clicks" Daily Activity  Outcome Measure  Difficulty turning over in bed (including adjusting bedclothes, sheets and blankets)?: A  Little Difficulty moving from lying on back to sitting on the side of the bed? : A Little Difficulty sitting down on and standing up from a chair with arms (e.g., wheelchair, bedside commode, etc,.)?: Unable Help needed moving to and from a bed to chair (including a wheelchair)?: A Little Help needed walking in hospital room?: A Little Help needed climbing 3-5 steps with a railing? : A Lot 6 Click Score: 15    End of Session Equipment Utilized During Treatment: Gait belt Activity Tolerance: Patient tolerated treatment well Patient left: in chair;with chair alarm set   PT Visit Diagnosis: Muscle weakness (generalized) (M62.81);Difficulty in walking, not elsewhere classified (R26.2)     Time:  2841-3244 PT Time Calculation (min) (ACUTE ONLY): 21 min  Charges:  $Gait Training: 8-22 mins                    G Codes:          Weston Anna, MPT Pager: [phone]

## 2017-08-27 NOTE — Progress Notes (Signed)
Pt required 2.5mg  IV Haldol @ 0130 due to continued agitation, both sons had asked for something to be given to their father d/t this agitation.  0230 pt was asleep 0420- beginning to awaken and try to get out of bed - able to redirect at the moment

## 2017-08-28 DIAGNOSIS — R945 Abnormal results of liver function studies: Secondary | ICD-10-CM | POA: Diagnosis not present

## 2017-08-28 DIAGNOSIS — I480 Paroxysmal atrial fibrillation: Secondary | ICD-10-CM | POA: Diagnosis not present

## 2017-08-28 DIAGNOSIS — M62838 Other muscle spasm: Secondary | ICD-10-CM | POA: Diagnosis not present

## 2017-08-28 DIAGNOSIS — I4891 Unspecified atrial fibrillation: Secondary | ICD-10-CM | POA: Diagnosis not present

## 2017-08-28 DIAGNOSIS — J181 Lobar pneumonia, unspecified organism: Secondary | ICD-10-CM | POA: Diagnosis not present

## 2017-08-28 DIAGNOSIS — R740 Nonspecific elevation of levels of transaminase and lactic acid dehydrogenase [LDH]: Secondary | ICD-10-CM | POA: Diagnosis not present

## 2017-08-28 DIAGNOSIS — E119 Type 2 diabetes mellitus without complications: Secondary | ICD-10-CM | POA: Diagnosis not present

## 2017-08-28 DIAGNOSIS — E7849 Other hyperlipidemia: Secondary | ICD-10-CM | POA: Diagnosis not present

## 2017-08-28 DIAGNOSIS — I951 Orthostatic hypotension: Secondary | ICD-10-CM | POA: Diagnosis not present

## 2017-08-28 DIAGNOSIS — B370 Candidal stomatitis: Secondary | ICD-10-CM | POA: Diagnosis not present

## 2017-08-28 DIAGNOSIS — R41841 Cognitive communication deficit: Secondary | ICD-10-CM | POA: Diagnosis not present

## 2017-08-28 DIAGNOSIS — I455 Other specified heart block: Secondary | ICD-10-CM | POA: Diagnosis not present

## 2017-08-28 DIAGNOSIS — R1314 Dysphagia, pharyngoesophageal phase: Secondary | ICD-10-CM | POA: Diagnosis not present

## 2017-08-28 DIAGNOSIS — R52 Pain, unspecified: Secondary | ICD-10-CM | POA: Diagnosis not present

## 2017-08-28 DIAGNOSIS — I10 Essential (primary) hypertension: Secondary | ICD-10-CM | POA: Diagnosis not present

## 2017-08-28 DIAGNOSIS — G4751 Confusional arousals: Secondary | ICD-10-CM | POA: Diagnosis not present

## 2017-08-28 DIAGNOSIS — R410 Disorientation, unspecified: Secondary | ICD-10-CM | POA: Diagnosis not present

## 2017-08-28 DIAGNOSIS — R279 Unspecified lack of coordination: Secondary | ICD-10-CM | POA: Diagnosis not present

## 2017-08-28 DIAGNOSIS — N3941 Urge incontinence: Secondary | ICD-10-CM | POA: Diagnosis not present

## 2017-08-28 DIAGNOSIS — F0280 Dementia in other diseases classified elsewhere without behavioral disturbance: Secondary | ICD-10-CM | POA: Diagnosis not present

## 2017-08-28 DIAGNOSIS — I82409 Acute embolism and thrombosis of unspecified deep veins of unspecified lower extremity: Secondary | ICD-10-CM | POA: Diagnosis not present

## 2017-08-28 DIAGNOSIS — E871 Hypo-osmolality and hyponatremia: Secondary | ICD-10-CM | POA: Diagnosis not present

## 2017-08-28 DIAGNOSIS — Z111 Encounter for screening for respiratory tuberculosis: Secondary | ICD-10-CM | POA: Diagnosis not present

## 2017-08-28 DIAGNOSIS — J189 Pneumonia, unspecified organism: Secondary | ICD-10-CM | POA: Diagnosis not present

## 2017-08-28 DIAGNOSIS — E539 Vitamin B deficiency, unspecified: Secondary | ICD-10-CM | POA: Diagnosis not present

## 2017-08-28 DIAGNOSIS — R32 Unspecified urinary incontinence: Secondary | ICD-10-CM | POA: Diagnosis not present

## 2017-08-28 DIAGNOSIS — M6281 Muscle weakness (generalized): Secondary | ICD-10-CM | POA: Diagnosis not present

## 2017-08-28 DIAGNOSIS — I495 Sick sinus syndrome: Secondary | ICD-10-CM | POA: Diagnosis not present

## 2017-08-28 DIAGNOSIS — N400 Enlarged prostate without lower urinary tract symptoms: Secondary | ICD-10-CM | POA: Diagnosis not present

## 2017-08-28 DIAGNOSIS — F0150 Vascular dementia without behavioral disturbance: Secondary | ICD-10-CM | POA: Diagnosis not present

## 2017-08-28 DIAGNOSIS — R4182 Altered mental status, unspecified: Secondary | ICD-10-CM | POA: Diagnosis not present

## 2017-08-28 DIAGNOSIS — K5900 Constipation, unspecified: Secondary | ICD-10-CM | POA: Diagnosis not present

## 2017-08-28 DIAGNOSIS — D649 Anemia, unspecified: Secondary | ICD-10-CM | POA: Diagnosis not present

## 2017-08-28 DIAGNOSIS — F419 Anxiety disorder, unspecified: Secondary | ICD-10-CM | POA: Diagnosis not present

## 2017-08-28 DIAGNOSIS — G301 Alzheimer's disease with late onset: Secondary | ICD-10-CM | POA: Diagnosis not present

## 2017-08-28 LAB — CBC
HEMATOCRIT: 23.6 % — AB (ref 39.0–52.0)
Hemoglobin: 8.3 g/dL — ABNORMAL LOW (ref 13.0–17.0)
MCH: 31.2 pg (ref 26.0–34.0)
MCHC: 35.2 g/dL (ref 30.0–36.0)
MCV: 88.7 fL (ref 78.0–100.0)
Platelets: 330 10*3/uL (ref 150–400)
RBC: 2.66 MIL/uL — AB (ref 4.22–5.81)
RDW: 13.3 % (ref 11.5–15.5)
WBC: 6.9 10*3/uL (ref 4.0–10.5)

## 2017-08-28 LAB — CULTURE, BLOOD (ROUTINE X 2)
Culture: NO GROWTH
Culture: NO GROWTH
Special Requests: ADEQUATE
Special Requests: ADEQUATE

## 2017-08-28 LAB — COMPREHENSIVE METABOLIC PANEL
ALT: 28 U/L (ref 17–63)
ANION GAP: 14 (ref 5–15)
AST: 25 U/L (ref 15–41)
Albumin: 2.5 g/dL — ABNORMAL LOW (ref 3.5–5.0)
Alkaline Phosphatase: 136 U/L — ABNORMAL HIGH (ref 38–126)
BUN: 15 mg/dL (ref 6–20)
CHLORIDE: 92 mmol/L — AB (ref 101–111)
CO2: 20 mmol/L — ABNORMAL LOW (ref 22–32)
CREATININE: 0.68 mg/dL (ref 0.61–1.24)
Calcium: 8.4 mg/dL — ABNORMAL LOW (ref 8.9–10.3)
Glucose, Bld: 223 mg/dL — ABNORMAL HIGH (ref 65–99)
POTASSIUM: 4.7 mmol/L (ref 3.5–5.1)
SODIUM: 126 mmol/L — AB (ref 135–145)
Total Bilirubin: 0.3 mg/dL (ref 0.3–1.2)
Total Protein: 6.4 g/dL — ABNORMAL LOW (ref 6.5–8.1)

## 2017-08-28 LAB — GLUCOSE, CAPILLARY
GLUCOSE-CAPILLARY: 168 mg/dL — AB (ref 65–99)
Glucose-Capillary: 201 mg/dL — ABNORMAL HIGH (ref 65–99)

## 2017-08-28 LAB — MAGNESIUM: MAGNESIUM: 1.4 mg/dL — AB (ref 1.7–2.4)

## 2017-08-28 MED ORDER — MAGNESIUM OXIDE 400 (241.3 MG) MG PO TABS
400.0000 mg | ORAL_TABLET | Freq: Two times a day (BID) | ORAL | Status: DC
  Administered 2017-08-28: 400 mg via ORAL
  Filled 2017-08-28: qty 1

## 2017-08-28 MED ORDER — MAGNESIUM OXIDE 400 (241.3 MG) MG PO TABS: 400.0000 mg | ORAL_TABLET | Freq: Two times a day (BID) | ORAL | 0 refills | Status: DC

## 2017-08-28 NOTE — Progress Notes (Signed)
Pt has rested well this night and has not had any agitation/ chest pain like the previous night. Ambulated to the bathroom a few times with his son.

## 2017-08-28 NOTE — Progress Notes (Addendum)
Patient has discharged to SNF for rehab; RN called for report and could not reach to RN; left a message with phone number at [phone].  Discharge instructions including medications and appointments was given to patient and spouse at bedside; patient has no question at this time.

## 2017-08-28 NOTE — Clinical Social Work Note (Signed)
Clinical Social Work Assessment  Patient Details  Name: Andrew Norton MRN: [id number] Date of Birth: 1926-08-29  Date of referral:  08/28/17               Reason for consult:  Facility Placement                Permission sought to share information with:  Case Norton, Customer service Norton, Family Supports Permission granted to share information::  Yes, Verbal Permission Granted  Name::     Andrew Norton::  SNF  Relationship::  Spouse  Contact Information:     Housing/Transportation Living arrangements for the past 2 months:  Single Family Home Source of Information:  Patient, Spouse Patient Interpreter Needed:  None Criminal Activity/Legal Involvement Pertinent to Current Situation/Hospitalization:  No - Comment as needed Significant Relationships:  Adult Children, Spouse Lives with:  Spouse Do you feel safe going back to the place where you live?  Yes Need for family participation in patient care:  Yes (Comment)  Care giving concerns:  No care giving concerns at the time of assessment.    Social Worker assessment / plan:  LCSW following for SNF placement.  Patient admitted for pneumonia.  LCSW met at bedside with patient and wife, Andrew Norton.   Patient reports that he ambulates with a walker at home. Spouse reports that there are stairs in the home, about 13 to upstairs. Andrew Norton reports that patient can climb the first few with out issues but becomes tired with the last few. Patient reports that he is independent in his ADLs. Patient does not drive.   Patient reports that he has been to SNF several times to recover after surgery and has found it beneficial.   PLAN: SNF at dc.   Employment status:  Retired Forensic scientist:  Medicare PT Recommendations:  [place] / Referral to community resources:  [place]  Patient/Family's Response to care:  Patient and family are pleased with care and thankful for LCSW visit and  services.   Patient/Family's Understanding of and Emotional Response to Diagnosis, Current Treatment, and Prognosis:  Patient and family are understanding of diagnosis and agreeable to treatment plan.   Emotional Assessment Appearance:  Appears stated age Attitude/Demeanor/Rapport:    Affect (typically observed):  Pleasant, Calm Orientation:  Oriented to Self, Oriented to Place, Oriented to Situation Alcohol / Substance use:  Not Applicable Psych involvement (Current and /or in the community):  No (Comment)  Discharge Needs  Concerns to be addressed:    Readmission within the last 30 days:  No Current discharge risk:  None Barriers to Discharge:  No Barriers Identified   Andrew Snare, LCSW 08/28/2017, 9:51 AM

## 2017-08-28 NOTE — Progress Notes (Signed)
RN called for report to RN Lattie Haw at [facility] [place] at 54; no question at this time.

## 2017-08-28 NOTE — Clinical Social Work Placement (Signed)
    9:56 AM Patient chose bed at [facility].  Patient can transport after lunch.   LCSW confirmed bed with facility.  LCSW notified family of transfer.   Patients spouse will transport to facility.  LCSW faxed dc documents to facility.  RN report # [phone]   CLINICAL SOCIAL WORK PLACEMENT  NOTE  Date:  08/28/2017  Patient Details  Name: Andrew Norton MRN: [id number] Date of Birth: 1926-11-09  Clinical Social Work is seeking post-discharge placement for this patient at the [place] level of care (*CSW will initial, date and re-position this form in  chart as items are completed):  Yes   Patient/family provided with [place] Work Department's list of facilities offering this level of care within the geographic area requested by the patient (or if unable, by the patient's family).  Yes   Patient/family informed of their freedom to choose among providers that offer the needed level of care, that participate in Medicare, Medicaid or managed care program needed by the patient, have an available bed and are willing to accept the patient.  Yes   Patient/family informed of [facility]'s ownership interest in [facility] and [facility], as well as of the fact that they are under no obligation to receive care at these facilities.  PASRR submitted to EDS on       PASRR number received on 08/27/17     Existing PASRR number confirmed on 08/27/17     FL2 transmitted to all facilities in geographic area requested by pt/family on 08/27/17     FL2 transmitted to all facilities within larger geographic area on       Patient informed that his/her managed care company has contracts with or will negotiate with certain facilities, including the following:        Yes   Patient/family informed of bed offers received.  Patient chooses bed at [facility]     Physician recommends and patient chooses bed at [facility]    Patient to be transferred to  [facility] on 08/28/17.  Patient to be transferred to facility by Car/family     Patient family notified on 08/28/17 of transfer.  Name of family member notified:  [name], spouse     PHYSICIAN       Additional Comment:    _______________________________________________ Servando Snare, LCSW 08/28/2017, 9:56 AM

## 2017-08-28 NOTE — Progress Notes (Signed)
Pt seen and examined at bedside, VS reviewed and stable, blood work reviewed, noted Mg 1.4, I have added Mg supplement to pt's medical list. Patient to take MgOX BID for 5 days and to have Mg level repeated before continuing supplementation. Also noted slight drop in Na level, pt is hemodynamically stable and mental status is stable, son at bedside, reports pt is at baseline. Pt can be discharged but Na level has to be checked in 1-2 days to ensure stability. Can consider sodium tablets if further drop noted.   Please see rest of the details under d/c summary by Dr. Reesa Chew.   Faye Ramsay, MD  [facility] Pager [phone]  If 7PM-7AM, please contact night-coverage [email or web address] Password [email or web address]

## 2017-08-29 DIAGNOSIS — D649 Anemia, unspecified: Secondary | ICD-10-CM | POA: Diagnosis not present

## 2017-08-29 DIAGNOSIS — J181 Lobar pneumonia, unspecified organism: Secondary | ICD-10-CM | POA: Diagnosis not present

## 2017-08-29 DIAGNOSIS — I480 Paroxysmal atrial fibrillation: Secondary | ICD-10-CM | POA: Diagnosis not present

## 2017-08-29 DIAGNOSIS — R740 Nonspecific elevation of levels of transaminase and lactic acid dehydrogenase [LDH]: Secondary | ICD-10-CM | POA: Diagnosis not present

## 2017-08-29 DIAGNOSIS — M62838 Other muscle spasm: Secondary | ICD-10-CM | POA: Diagnosis not present

## 2017-08-29 DIAGNOSIS — M6281 Muscle weakness (generalized): Secondary | ICD-10-CM | POA: Diagnosis not present

## 2017-08-29 DIAGNOSIS — N400 Enlarged prostate without lower urinary tract symptoms: Secondary | ICD-10-CM | POA: Diagnosis not present

## 2017-08-29 DIAGNOSIS — I10 Essential (primary) hypertension: Secondary | ICD-10-CM | POA: Diagnosis not present

## 2017-08-29 DIAGNOSIS — I495 Sick sinus syndrome: Secondary | ICD-10-CM | POA: Diagnosis not present

## 2017-08-29 DIAGNOSIS — G4751 Confusional arousals: Secondary | ICD-10-CM | POA: Diagnosis not present

## 2017-08-29 DIAGNOSIS — R32 Unspecified urinary incontinence: Secondary | ICD-10-CM | POA: Diagnosis not present

## 2017-08-29 DIAGNOSIS — F0150 Vascular dementia without behavioral disturbance: Secondary | ICD-10-CM | POA: Diagnosis not present

## 2017-08-30 DIAGNOSIS — G4751 Confusional arousals: Secondary | ICD-10-CM | POA: Diagnosis not present

## 2017-08-30 DIAGNOSIS — J181 Lobar pneumonia, unspecified organism: Secondary | ICD-10-CM | POA: Diagnosis not present

## 2017-08-30 DIAGNOSIS — I480 Paroxysmal atrial fibrillation: Secondary | ICD-10-CM | POA: Diagnosis not present

## 2017-08-30 DIAGNOSIS — R32 Unspecified urinary incontinence: Secondary | ICD-10-CM | POA: Diagnosis not present

## 2017-08-30 DIAGNOSIS — I10 Essential (primary) hypertension: Secondary | ICD-10-CM | POA: Diagnosis not present

## 2017-08-30 DIAGNOSIS — R740 Nonspecific elevation of levels of transaminase and lactic acid dehydrogenase [LDH]: Secondary | ICD-10-CM | POA: Diagnosis not present

## 2017-08-30 DIAGNOSIS — F0150 Vascular dementia without behavioral disturbance: Secondary | ICD-10-CM | POA: Diagnosis not present

## 2017-08-30 DIAGNOSIS — N400 Enlarged prostate without lower urinary tract symptoms: Secondary | ICD-10-CM | POA: Diagnosis not present

## 2017-08-30 DIAGNOSIS — D649 Anemia, unspecified: Secondary | ICD-10-CM | POA: Diagnosis not present

## 2017-08-30 DIAGNOSIS — M62838 Other muscle spasm: Secondary | ICD-10-CM | POA: Diagnosis not present

## 2017-08-30 DIAGNOSIS — M6281 Muscle weakness (generalized): Secondary | ICD-10-CM | POA: Diagnosis not present

## 2017-08-30 DIAGNOSIS — I495 Sick sinus syndrome: Secondary | ICD-10-CM | POA: Diagnosis not present

## 2017-09-02 ENCOUNTER — Ambulatory Visit (INDEPENDENT_AMBULATORY_CARE_PROVIDER_SITE_OTHER): Payer: Medicare Other | Admitting: *Deleted

## 2017-09-02 ENCOUNTER — Telehealth: Payer: Self-pay | Admitting: Cardiology

## 2017-09-02 DIAGNOSIS — I480 Paroxysmal atrial fibrillation: Secondary | ICD-10-CM | POA: Diagnosis not present

## 2017-09-02 DIAGNOSIS — I495 Sick sinus syndrome: Secondary | ICD-10-CM | POA: Diagnosis not present

## 2017-09-02 DIAGNOSIS — R32 Unspecified urinary incontinence: Secondary | ICD-10-CM | POA: Diagnosis not present

## 2017-09-02 DIAGNOSIS — E119 Type 2 diabetes mellitus without complications: Secondary | ICD-10-CM | POA: Diagnosis not present

## 2017-09-02 DIAGNOSIS — J181 Lobar pneumonia, unspecified organism: Secondary | ICD-10-CM | POA: Diagnosis not present

## 2017-09-02 DIAGNOSIS — R52 Pain, unspecified: Secondary | ICD-10-CM | POA: Diagnosis not present

## 2017-09-02 DIAGNOSIS — R740 Nonspecific elevation of levels of transaminase and lactic acid dehydrogenase [LDH]: Secondary | ICD-10-CM | POA: Diagnosis not present

## 2017-09-02 DIAGNOSIS — I10 Essential (primary) hypertension: Secondary | ICD-10-CM | POA: Diagnosis not present

## 2017-09-02 DIAGNOSIS — N400 Enlarged prostate without lower urinary tract symptoms: Secondary | ICD-10-CM | POA: Diagnosis not present

## 2017-09-02 DIAGNOSIS — D649 Anemia, unspecified: Secondary | ICD-10-CM | POA: Diagnosis not present

## 2017-09-02 DIAGNOSIS — M6281 Muscle weakness (generalized): Secondary | ICD-10-CM | POA: Diagnosis not present

## 2017-09-02 DIAGNOSIS — M62838 Other muscle spasm: Secondary | ICD-10-CM | POA: Diagnosis not present

## 2017-09-02 NOTE — Telephone Encounter (Signed)
LMOVM reminding pt to send remote transmission.   

## 2017-09-04 DIAGNOSIS — I495 Sick sinus syndrome: Secondary | ICD-10-CM | POA: Diagnosis not present

## 2017-09-04 DIAGNOSIS — D649 Anemia, unspecified: Secondary | ICD-10-CM | POA: Diagnosis not present

## 2017-09-04 DIAGNOSIS — R740 Nonspecific elevation of levels of transaminase and lactic acid dehydrogenase [LDH]: Secondary | ICD-10-CM | POA: Diagnosis not present

## 2017-09-04 DIAGNOSIS — I480 Paroxysmal atrial fibrillation: Secondary | ICD-10-CM | POA: Diagnosis not present

## 2017-09-04 DIAGNOSIS — I10 Essential (primary) hypertension: Secondary | ICD-10-CM | POA: Diagnosis not present

## 2017-09-04 DIAGNOSIS — N400 Enlarged prostate without lower urinary tract symptoms: Secondary | ICD-10-CM | POA: Diagnosis not present

## 2017-09-04 DIAGNOSIS — M62838 Other muscle spasm: Secondary | ICD-10-CM | POA: Diagnosis not present

## 2017-09-04 DIAGNOSIS — E119 Type 2 diabetes mellitus without complications: Secondary | ICD-10-CM | POA: Diagnosis not present

## 2017-09-04 DIAGNOSIS — B370 Candidal stomatitis: Secondary | ICD-10-CM | POA: Diagnosis not present

## 2017-09-04 DIAGNOSIS — R32 Unspecified urinary incontinence: Secondary | ICD-10-CM | POA: Diagnosis not present

## 2017-09-04 DIAGNOSIS — M6281 Muscle weakness (generalized): Secondary | ICD-10-CM | POA: Diagnosis not present

## 2017-09-04 DIAGNOSIS — J181 Lobar pneumonia, unspecified organism: Secondary | ICD-10-CM | POA: Diagnosis not present

## 2017-09-05 ENCOUNTER — Encounter: Payer: Self-pay | Admitting: Cardiology

## 2017-09-05 DIAGNOSIS — I495 Sick sinus syndrome: Secondary | ICD-10-CM | POA: Diagnosis not present

## 2017-09-05 DIAGNOSIS — I951 Orthostatic hypotension: Secondary | ICD-10-CM | POA: Diagnosis not present

## 2017-09-05 NOTE — Progress Notes (Signed)
Remote pacemaker transmission.   

## 2017-09-06 LAB — CUP PACEART REMOTE DEVICE CHECK
Brady Statistic AP VS Percent: 43.65 %
Brady Statistic AS VP Percent: 0 %
Brady Statistic RA Percent Paced: 43.59 %
Brady Statistic RV Percent Paced: 0.02 %
Date Time Interrogation Session: 20190221145513
Implantable Lead Implant Date: 20171113
Implantable Lead Implant Date: 20171113
Implantable Lead Location: 753859
Implantable Lead Location: 753860
Implantable Lead Model: 5076
Lead Channel Impedance Value: 456 Ohm
Lead Channel Pacing Threshold Pulse Width: 0.4 ms
Lead Channel Sensing Intrinsic Amplitude: 6.125 mV
Lead Channel Sensing Intrinsic Amplitude: 6.125 mV
Lead Channel Setting Pacing Amplitude: 2.5 V
Lead Channel Setting Pacing Amplitude: 4 V
Lead Channel Setting Pacing Pulse Width: 0.8 ms
Lead Channel Setting Sensing Sensitivity: 1.2 mV
MDC IDC MSMT BATTERY REMAINING LONGEVITY: 115 mo
MDC IDC MSMT BATTERY VOLTAGE: 3.02 V
MDC IDC MSMT LEADCHNL RA IMPEDANCE VALUE: 380 Ohm
MDC IDC MSMT LEADCHNL RA IMPEDANCE VALUE: 437 Ohm
MDC IDC MSMT LEADCHNL RA PACING THRESHOLD AMPLITUDE: 1.375 V
MDC IDC MSMT LEADCHNL RA SENSING INTR AMPL: 1.5 mV
MDC IDC MSMT LEADCHNL RA SENSING INTR AMPL: 1.5 mV
MDC IDC MSMT LEADCHNL RV IMPEDANCE VALUE: 399 Ohm
MDC IDC MSMT LEADCHNL RV PACING THRESHOLD AMPLITUDE: 2.5 V
MDC IDC MSMT LEADCHNL RV PACING THRESHOLD PULSEWIDTH: 0.4 ms
MDC IDC PG IMPLANT DT: 20171113
MDC IDC STAT BRADY AP VP PERCENT: 0.01 %
MDC IDC STAT BRADY AS VS PERCENT: 56.34 %

## 2017-09-10 ENCOUNTER — Ambulatory Visit: Payer: Medicare Other | Admitting: Cardiology

## 2017-09-11 DIAGNOSIS — M62838 Other muscle spasm: Secondary | ICD-10-CM | POA: Diagnosis not present

## 2017-09-11 DIAGNOSIS — D649 Anemia, unspecified: Secondary | ICD-10-CM | POA: Diagnosis not present

## 2017-09-11 DIAGNOSIS — I480 Paroxysmal atrial fibrillation: Secondary | ICD-10-CM | POA: Diagnosis not present

## 2017-09-11 DIAGNOSIS — B370 Candidal stomatitis: Secondary | ICD-10-CM | POA: Diagnosis not present

## 2017-09-11 DIAGNOSIS — I495 Sick sinus syndrome: Secondary | ICD-10-CM | POA: Diagnosis not present

## 2017-09-11 DIAGNOSIS — R740 Nonspecific elevation of levels of transaminase and lactic acid dehydrogenase [LDH]: Secondary | ICD-10-CM | POA: Diagnosis not present

## 2017-09-11 DIAGNOSIS — E119 Type 2 diabetes mellitus without complications: Secondary | ICD-10-CM | POA: Diagnosis not present

## 2017-09-11 DIAGNOSIS — M6281 Muscle weakness (generalized): Secondary | ICD-10-CM | POA: Diagnosis not present

## 2017-09-11 DIAGNOSIS — J181 Lobar pneumonia, unspecified organism: Secondary | ICD-10-CM | POA: Diagnosis not present

## 2017-09-11 DIAGNOSIS — N400 Enlarged prostate without lower urinary tract symptoms: Secondary | ICD-10-CM | POA: Diagnosis not present

## 2017-09-11 DIAGNOSIS — I10 Essential (primary) hypertension: Secondary | ICD-10-CM | POA: Diagnosis not present

## 2017-09-11 DIAGNOSIS — R32 Unspecified urinary incontinence: Secondary | ICD-10-CM | POA: Diagnosis not present

## 2017-09-17 DIAGNOSIS — J181 Lobar pneumonia, unspecified organism: Secondary | ICD-10-CM | POA: Diagnosis not present

## 2017-09-17 DIAGNOSIS — R32 Unspecified urinary incontinence: Secondary | ICD-10-CM | POA: Diagnosis not present

## 2017-09-17 DIAGNOSIS — I10 Essential (primary) hypertension: Secondary | ICD-10-CM | POA: Diagnosis not present

## 2017-09-17 DIAGNOSIS — D649 Anemia, unspecified: Secondary | ICD-10-CM | POA: Diagnosis not present

## 2017-09-17 DIAGNOSIS — R740 Nonspecific elevation of levels of transaminase and lactic acid dehydrogenase [LDH]: Secondary | ICD-10-CM | POA: Diagnosis not present

## 2017-09-17 DIAGNOSIS — B370 Candidal stomatitis: Secondary | ICD-10-CM | POA: Diagnosis not present

## 2017-09-17 DIAGNOSIS — I495 Sick sinus syndrome: Secondary | ICD-10-CM | POA: Diagnosis not present

## 2017-09-17 DIAGNOSIS — N400 Enlarged prostate without lower urinary tract symptoms: Secondary | ICD-10-CM | POA: Diagnosis not present

## 2017-09-17 DIAGNOSIS — M6281 Muscle weakness (generalized): Secondary | ICD-10-CM | POA: Diagnosis not present

## 2017-09-17 DIAGNOSIS — E119 Type 2 diabetes mellitus without complications: Secondary | ICD-10-CM | POA: Diagnosis not present

## 2017-09-17 DIAGNOSIS — I480 Paroxysmal atrial fibrillation: Secondary | ICD-10-CM | POA: Diagnosis not present

## 2017-09-17 DIAGNOSIS — M62838 Other muscle spasm: Secondary | ICD-10-CM | POA: Diagnosis not present

## 2017-09-17 NOTE — Progress Notes (Signed)
Electrophysiology Office Note   Date:  09/18/2017   ID:  Andrew, Norton [birth year], MRN [id number]  PCP:  Laurey Morale, MD  Cardiologist:  Tamala Julian Primary Electrophysiologist:  Adrain Nesbit Meredith Leeds, MD    Chief Complaint  Patient presents with  . Pacemaker Check    Tachycardia-bradycardia syndrome     History of Present Illness: Andrew Norton is a 84 y.o. male who presents today for electrophysiology evaluation.   History of HTN, DMT2, cervical myelopathy with gain instability who was directly admitted to [facility] on 05/17/16 for new onset of jaundice and abnormal CT ab/pel scan which showed "intrahepatic biliary ductal dilatation." He went into atrial fibrillation with rapid rates while in the hospital, and was placed on flecainide. He also had evidence of bradycardia, and thus had a Medtronic dual chamber pacemaker placed on 05/28/16.  She [name] be admitted to the hospital with pneumonia.  Since discharge, he has been in a nursing facility and has had issues with delirium.  He is sleeping most of the day.  He has no major complaints at this time.  Today, denies symptoms of palpitations, chest pain, shortness of breath, orthopnea, PND, lower extremity edema, claudication, dizziness, presyncope, syncope, bleeding, or neurologic sequela. The patient is tolerating medications without difficulties.     Past Medical History:  Diagnosis Date  . Anemia   . Arthritis    hips  . Atrial fibrillation ([place])   . Carpal tunnel syndrome    left leg sciatica  . Diabetes mellitus   . Hearing loss    left ear greater loss  . History of shingles    past hx. left hand  . Hyperlipidemia   . Hypertension    EKG, chest x ray 11/15/10 EPIC,  clearance Dr Sharlene Motts on chart/ pt states on meds to "prevent hypertension from diabetes"  . Paget's disease    scrotum  . Paroxysmal atrial fibrillation ([place]) 05/18/2016  . Positive PPD, treated    Past Surgical History:  Procedure Laterality Date  . CERVICAL FUSION       dr Vertell Limber 2009- retained hardware  . colonoscopy  2-08   per Dr. Oletta Lamas, normal   . EP IMPLANTABLE DEVICE N/A 05/28/2016   Procedure: Pacemaker Implant;  Surgeon: Mykelle Cockerell Meredith Leeds, MD;  Location: [facility];  Service: Cardiovascular;  Laterality: N/A;  . ERCP N/A 05/22/2016   Procedure: ENDOSCOPIC RETROGRADE CHOLANGIOPANCREATOGRAPHY (ERCP);  Surgeon: Milus Banister, MD;  Location: [facility];  Service: Endoscopy;  Laterality: N/A;  . EYE SURGERY     bilateral cataract extraction with IOL  . HIP FRACTURE SURGERY  11-24-10   left hip per Dr, [name]  . LUMBAR LAMINECTOMY/DECOMPRESSION MICRODISCECTOMY  11/07/2011   Procedure: LUMBAR LAMINECTOMY/DECOMPRESSION MICRODISCECTOMY;  Surgeon: Johnn Hai, MD;  Location: [facility];  Service: Orthopedics;  Laterality: N/A;  Decompression of the L4 - L5 Central (X-ray)   . partial removal of scrotum    . TONSILLECTOMY    . TOTAL HIP REVISION Left 11/05/2012   Procedure: Conversion of a Bipolar to a Left Total Hip Arthroplasty;  Surgeon: [name], MD;  Location: [facility];  Service: Orthopedics;  Laterality: Left;  Conversion of a Bipolar to a Left Total Hip Arthroplasty     Current Outpatient Medications  Medication Sig Dispense Refill  . cyanocobalamin (,VITAMIN B-12,) 1000 MCG/ML injection Inject 1,000 mcg into the muscle every 30 (thirty) days.    . cyclobenzaprine (FLEXERIL) 10 MG tablet TAKE  1 TABLET 3 TIMES DAILYAS NEEDED FOR MUSCLE SPASMS 270 tablet 3  . Ferrous Sulfate 220 (44 Fe) MG/5ML LIQD Take 7 mLs by mouth daily. TAKE 7 MLS BY MOUTH DAILY 946 mL 11  . finasteride (PROSCAR) 5 MG tablet Take 1 tablet (5 mg total) by mouth daily. 90 tablet 1  . flecainide (TAMBOCOR) 50 MG tablet Take 1 tablet (50 mg total) by mouth every 12 (twelve) hours. 180 tablet 2  . gabapentin (NEURONTIN) 400 MG capsule TAKE 1 CAPSULE 4 TIMES     DAILY 360 capsule 3  . glipiZIDE (GLUCOTROL) 10 MG tablet Take 0.5 tablets (5 mg total) by mouth 2  (two) times daily. 2 tablet 0  . Glucose Blood (TRUE METRIX BLOOD GLUCOSE TEST VI) by In Vitro route 2 (two) times daily. Diagnosis code is E 11.9    . HYDROcodone-acetaminophen (NORCO/VICODIN) 5-325 MG tablet Take 1 tablet by mouth every 6 (six) hours as needed for moderate pain. 120 tablet 0  . lidocaine (LIDODERM) 5 % Place 1 patch onto the skin daily. Remove & Discard patch within 12 hours or as directed by MD 90 patch 3  . metFORMIN (GLUCOPHAGE) 1000 MG tablet TAKE 1 TABLET TWICE A DAY  WITH MEALS 180 tablet 3  . metoprolol tartrate (LOPRESSOR) 25 MG tablet Take 0.5 tablets (12.5 mg total) by mouth 2 (two) times daily. 180 tablet 0  . NUTRITIONAL SUPPLEMENT LIQD Take 120 mLs by mouth 2 (two) times daily. SF MedPass for nutritional support    . polyethylene glycol powder (GLYCOLAX/MIRALAX) powder Take 17 g by mouth daily. (Patient taking differently: Take 17 g by mouth as needed. ) 3350 g 3  . rivaroxaban (XARELTO) 20 MG TABS tablet Take 1 tablet (20 mg total) by mouth daily with supper. 90 tablet 3  . sitaGLIPtin (JANUVIA) 100 MG tablet Take 1 tablet (100 mg total) by mouth daily. 90 tablet 3  . tamsulosin (FLOMAX) 0.4 MG CAPS capsule Take 1 capsule (0.4 mg total) by mouth daily after supper. 30 capsule 1   No current facility-administered medications for this visit.     Allergies:   Penicillins and Ppd [tuberculin purified protein derivative]   Social History:  The patient  reports that  has never smoked. he has never used smokeless tobacco. He reports that he does not drink alcohol or use drugs.   Family History:  The patient's family history includes Heart failure in his father; Tuberculosis in his mother.    ROS:  Please see the history of present illness.   Otherwise, review of systems is positive for recent pneumonia, dizziness.   All other systems are reviewed and negative.   PHYSICAL EXAM: VS:  BP (!) 92/52   Pulse 74   Ht 5\' 9"  (1.753 m)   Wt 152 lb 3.2 oz (69 kg)   BMI  22.48 kg/m  , BMI Body mass index is 22.48 kg/m. GEN: Well nourished, well developed, in no acute distress  HEENT: normal  Neck: no JVD, carotid bruits, or masses Cardiac: Sinus rhythm, left axis deviation, incomplete left bundle branch block, prolonged QTC 486 ms RRR; no murmurs, rubs, or gallops,no edema  Respiratory:  clear to auscultation bilaterally, normal work of breathing GI: soft, nontender, nondistended, + BS MS: no deformity or atrophy  Skin: warm and dry, device site well healed Neuro:  Strength and sensation are intact Psych: euthymic mood, full affect  EKG:  EKG is not ordered today. Personal review of the ekg ordered 08/26/17  shows sinus rhythm, left axis deviation, rate 79, prolonged QTC 486 ms  Personal review of the device interrogation today. Results in [place]: 08/21/2017: TSH 2.66 08/28/2017: ALT 28; BUN 15; Creatinine, Ser 0.68; Hemoglobin 8.3; Magnesium 1.4; Platelets 330; Potassium 4.7; Sodium 126    Lipid Panel     Component Value Date/Time   CHOL 128 11/29/2016 1010   TRIG 126.0 11/29/2016 1010   HDL 47.00 11/29/2016 1010   CHOLHDL 3 11/29/2016 1010   VLDL 25.2 11/29/2016 1010   LDLCALC 56 11/29/2016 1010     Wt Readings from Last 3 Encounters:  09/18/17 152 lb 3.2 oz (69 kg)  08/23/17 157 lb 6.4 oz (71.4 kg)  08/21/17 157 lb 12.8 oz (71.6 kg)      Other studies Reviewed: Additional studies/ records that were reviewed today include: TTE 05/19/16  Review of the above records today demonstrates:  - Left ventricle: The cavity size was normal. Wall thickness was   increased in a pattern of mild LVH. Systolic function was normal.   The estimated ejection fraction was in the range of 55% to 60%.   Wall motion was normal; there were no regional wall motion   abnormalities. Doppler parameters are consistent with abnormal   left ventricular relaxation (grade 1 diastolic dysfunction). - Aortic valve: There was trivial regurgitation. -  Mitral valve: Calcified annulus. - Pulmonary arteries: Systolic pressure was mildly increased. PA   peak pressure: 52 mm Hg (S).   ASSESSMENT AND PLAN:  1.  Tachybradycardia syndrome: Status post Medtronic dual-chamber pacemaker implanted 05/29/16.  Device functioning appropriately.  Has chronically elevated RV threshold.  No changes.    2. Paroxysmal atrial fibrillation: Currently on flecainide, metoprolol, Xarelto.  In sinus rhythm.  No changes.  This patients CHA2DS2-VASc Score and unadjusted Ischemic Stroke Rate (% per year) is equal to 4.8 % stroke rate/year from a score of 4  Above score calculated as 1 point each if present [CHF, HTN, DM, Vascular=MI/PAD/Aortic Plaque, Age if 65-74, or Male] Above score calculated as 2 points each if present [Age > 75, or Stroke/TIA/TE]   3. Hypertension: Blood pressure has been low since hospitalization for pneumonia.  He is now off of all of his blood pressure medicines except for metoprolol.  He has had issues with orthostasis.  Would continue with metoprolol and flecainide.  Per his family, they Vaughn Frieze try and have him drink more fluids.  4. Hyperlipidemia: Continue statin    Current medicines are reviewed at length with the patient today.   The patient does not have concerns regarding his medicines.  The following changes were made today: None  Labs/ tests ordered today include:  No orders of the defined types were placed in this encounter.    Disposition:   FU with Andrei Mccook 6 months  Signed, Jeylin Woodmansee Meredith Leeds, MD  09/18/2017 10:23 AM     [facility] [address] [place] [address] [phone] (office) [phone] (fax)

## 2017-09-18 ENCOUNTER — Encounter: Payer: Self-pay | Admitting: Cardiology

## 2017-09-18 ENCOUNTER — Ambulatory Visit (INDEPENDENT_AMBULATORY_CARE_PROVIDER_SITE_OTHER): Payer: Medicare Other | Admitting: Cardiology

## 2017-09-18 VITALS — BP 92/52 | HR 74 | Ht 69.0 in | Wt 152.2 lb

## 2017-09-18 DIAGNOSIS — I455 Other specified heart block: Secondary | ICD-10-CM | POA: Diagnosis not present

## 2017-09-18 DIAGNOSIS — I480 Paroxysmal atrial fibrillation: Secondary | ICD-10-CM | POA: Diagnosis not present

## 2017-09-18 DIAGNOSIS — I495 Sick sinus syndrome: Secondary | ICD-10-CM

## 2017-09-18 DIAGNOSIS — E7849 Other hyperlipidemia: Secondary | ICD-10-CM | POA: Diagnosis not present

## 2017-09-18 DIAGNOSIS — I10 Essential (primary) hypertension: Secondary | ICD-10-CM | POA: Diagnosis not present

## 2017-09-18 LAB — CUP PACEART INCLINIC DEVICE CHECK
Brady Statistic AP VS Percent: 51.72 %
Brady Statistic AS VP Percent: 0 %
Implantable Lead Implant Date: 20171113
Implantable Lead Location: 753859
Implantable Lead Model: 5076
Lead Channel Impedance Value: 380 Ohm
Lead Channel Impedance Value: 437 Ohm
Lead Channel Pacing Threshold Pulse Width: 0.4 ms
Lead Channel Sensing Intrinsic Amplitude: 7.625 mV
Lead Channel Setting Pacing Amplitude: 2.5 V
Lead Channel Setting Pacing Amplitude: 4 V
Lead Channel Setting Pacing Pulse Width: 0.8 ms
Lead Channel Setting Sensing Sensitivity: 1.2 mV
MDC IDC LEAD IMPLANT DT: 20171113
MDC IDC LEAD LOCATION: 753860
MDC IDC MSMT BATTERY REMAINING LONGEVITY: 108 mo
MDC IDC MSMT BATTERY VOLTAGE: 3.02 V
MDC IDC MSMT LEADCHNL RA IMPEDANCE VALUE: 380 Ohm
MDC IDC MSMT LEADCHNL RA PACING THRESHOLD AMPLITUDE: 0.75 V
MDC IDC MSMT LEADCHNL RA PACING THRESHOLD PULSEWIDTH: 0.4 ms
MDC IDC MSMT LEADCHNL RA SENSING INTR AMPL: 2 mV
MDC IDC MSMT LEADCHNL RV IMPEDANCE VALUE: 456 Ohm
MDC IDC MSMT LEADCHNL RV PACING THRESHOLD AMPLITUDE: 2.25 V
MDC IDC PG IMPLANT DT: 20171113
MDC IDC SESS DTM: 20190306104343
MDC IDC STAT BRADY AP VP PERCENT: 0.01 %
MDC IDC STAT BRADY AS VS PERCENT: 48.27 %
MDC IDC STAT BRADY RA PERCENT PACED: 51.68 %
MDC IDC STAT BRADY RV PERCENT PACED: 0.01 %

## 2017-09-18 NOTE — Patient Instructions (Signed)
Medication Instructions:  Your physician recommends that you continue on your current medications as directed. Please refer to the Current Medication list given to you today.  *If you need a refill on your cardiac medications before your next appointment, please call your pharmacy*  Labwork: None ordered  Testing/Procedures: None ordered  Follow-Up: Remote monitoring is used to monitor your Pacemaker or ICD from home. This monitoring reduces the number of office visits required to check your device to one time per year. It allows [place] to keep an eye on the functioning of your device to ensure it is working properly. You are scheduled for a device check from home on 12/02/2017. You may send your transmission at any time that day. If you have a wireless device, the transmission will be sent automatically. After your physician reviews your transmission, you will receive a postcard with your next transmission date.  Your physician wants you to follow-up in: 6 months with Dr. Curt Bears.  You will receive a reminder letter in the mail two months in advance. If you don't receive a letter, please call our office to schedule the follow-up appointment.  Thank you for choosing [facility]!!   [name], RN [phone]

## 2017-09-23 ENCOUNTER — Encounter: Payer: Self-pay | Admitting: Family Medicine

## 2017-09-23 ENCOUNTER — Ambulatory Visit (INDEPENDENT_AMBULATORY_CARE_PROVIDER_SITE_OTHER): Payer: Medicare Other | Admitting: Family Medicine

## 2017-09-23 VITALS — BP 160/68 | HR 77 | Temp 98.1°F | Wt 148.0 lb

## 2017-09-23 DIAGNOSIS — Z7984 Long term (current) use of oral hypoglycemic drugs: Secondary | ICD-10-CM | POA: Diagnosis not present

## 2017-09-23 DIAGNOSIS — D509 Iron deficiency anemia, unspecified: Secondary | ICD-10-CM | POA: Diagnosis not present

## 2017-09-23 DIAGNOSIS — E119 Type 2 diabetes mellitus without complications: Secondary | ICD-10-CM

## 2017-09-23 DIAGNOSIS — M6281 Muscle weakness (generalized): Secondary | ICD-10-CM | POA: Diagnosis not present

## 2017-09-23 DIAGNOSIS — I482 Chronic atrial fibrillation: Secondary | ICD-10-CM | POA: Diagnosis not present

## 2017-09-23 DIAGNOSIS — I495 Sick sinus syndrome: Secondary | ICD-10-CM | POA: Diagnosis not present

## 2017-09-23 DIAGNOSIS — Z7901 Long term (current) use of anticoagulants: Secondary | ICD-10-CM | POA: Diagnosis not present

## 2017-09-23 DIAGNOSIS — Z7982 Long term (current) use of aspirin: Secondary | ICD-10-CM | POA: Diagnosis not present

## 2017-09-23 DIAGNOSIS — E43 Unspecified severe protein-calorie malnutrition: Secondary | ICD-10-CM

## 2017-09-23 DIAGNOSIS — E785 Hyperlipidemia, unspecified: Secondary | ICD-10-CM | POA: Diagnosis not present

## 2017-09-23 DIAGNOSIS — I480 Paroxysmal atrial fibrillation: Secondary | ICD-10-CM | POA: Diagnosis not present

## 2017-09-23 DIAGNOSIS — F0391 Unspecified dementia with behavioral disturbance: Secondary | ICD-10-CM

## 2017-09-23 DIAGNOSIS — Z8701 Personal history of pneumonia (recurrent): Secondary | ICD-10-CM | POA: Diagnosis not present

## 2017-09-23 DIAGNOSIS — E1140 Type 2 diabetes mellitus with diabetic neuropathy, unspecified: Secondary | ICD-10-CM | POA: Diagnosis not present

## 2017-09-23 DIAGNOSIS — J189 Pneumonia, unspecified organism: Secondary | ICD-10-CM

## 2017-09-23 DIAGNOSIS — I10 Essential (primary) hypertension: Secondary | ICD-10-CM

## 2017-09-23 DIAGNOSIS — Z950 Presence of cardiac pacemaker: Secondary | ICD-10-CM | POA: Diagnosis not present

## 2017-09-23 DIAGNOSIS — F0390 Unspecified dementia without behavioral disturbance: Secondary | ICD-10-CM | POA: Diagnosis not present

## 2017-09-23 DIAGNOSIS — R1310 Dysphagia, unspecified: Secondary | ICD-10-CM | POA: Diagnosis not present

## 2017-09-23 MED ORDER — RISPERIDONE 1 MG PO TABS: 1.0000 mg | ORAL_TABLET | Freq: Every day | ORAL | 3 refills | Status: DC

## 2017-09-23 MED ORDER — GABAPENTIN 400 MG PO CAPS: 400.0000 mg | ORAL_CAPSULE | Freq: Three times a day (TID) | ORAL | 3 refills | Status: DC

## 2017-09-23 NOTE — Progress Notes (Addendum)
   Subjective:    Patient ID: Andrew Norton, male    DOB: 02/18/1927, 84 y.o.   MRN: [id number]  HPI Here to follow up a hospital stay from 08-23-17 to 08-27-17 for a CAP. He did will with IV Rocephin and Azithromycin, then he was sent to rehab at [facility] on oral Vantin and Azithromycin. While in the hospital he had some episodes of bradycardia and his pacemaker was activated, so his Metoprolol was decreased to 12. 5 mg BID. He saw the Cardiology office on 09-18-17 and they were pleased with his cardiac status. While at [facility] a SLUMS ([place] Mental Status) evaluation was performed and he scored a 7 out of a possible 30 points, so he was found to have severely decreased cognitive abilities. He is here today with his wife and 2 of his sons, and they all agree he has declined significantly in functional ability very quickly. Over the past year he has shown increasing memory issues and intermittent confusion. Since the recent pneumonia, these have become more pronounced. It is very difficult for the wife to care for him now. He needs total assistance with all ADLs at this point. He is no longer coughing and denies any SOB. The family notes that in the evenings and during the nights he becomes much more confused, he gets agitated and anxious, he hallucinates and he sees and talks to long deceased family members, etc.   Review of Systems  Respiratory: Negative.   Cardiovascular: Negative.   Neurological: Positive for weakness. Negative for syncope and speech difficulty.  Psychiatric/Behavioral: Positive for agitation, behavioral problems, confusion, hallucinations and sleep disturbance. Negative for dysphoric mood. The patient is nervous/anxious.        Objective:   Physical Exam  Constitutional: He appears well-developed and well-nourished.  Cardiovascular: Normal rate, regular rhythm, normal heart sounds and intact distal pulses.  Pulmonary/Chest: Effort normal and breath sounds normal.  No respiratory distress. He has no wheezes. He has no rales.  Neurological: He is alert. No cranial nerve deficit. He exhibits normal muscle tone.  Oriented to self and to place, but not to date. Walks with a walker           Assessment & Plan:  He is recovering from a recent pneumonia, and he is doing well from a pulmonary status. His HTN and atrial fibrillation are stable. However he is frail and malnourished, and he is showing signs of advancing dementia. I recommended the family begin searching for a skilled nursing facility with a memory care unit to place Andrew Norton, and they will discuss this at home. In the meantime we will start him on Risperidone 1 mg qhs to help with the sundowning symptoms. He is scheduled to see Dr. Krista Blue on 11-21-17 for a neurologic evaluation. Alysia Penna, MD

## 2017-09-24 ENCOUNTER — Telehealth: Payer: Self-pay | Admitting: Family Medicine

## 2017-09-24 DIAGNOSIS — F0390 Unspecified dementia without behavioral disturbance: Secondary | ICD-10-CM | POA: Insufficient documentation

## 2017-09-24 NOTE — Telephone Encounter (Signed)
Copied from [place] [phone]. Topic: General - Other >> Sep 24, 2017 10:06 AM Yvette Rack wrote: Reason for CRM: Claiborne Billings form [facility] [phone] calling for verbal orders   [place] for twice a week for 3-4 weeks Approval for Home Health Aid

## 2017-09-24 NOTE — Telephone Encounter (Signed)
To PCP to advise.

## 2017-09-24 NOTE — Telephone Encounter (Signed)
Called [name] and left a detailed VM with pt's name and DOB giving her the OK for verbal orders requested ask for her to call back to confirm that she did get my VM. Will keep until then.

## 2017-09-24 NOTE — Telephone Encounter (Signed)
Please OK the orders

## 2017-09-25 DIAGNOSIS — F0390 Unspecified dementia without behavioral disturbance: Secondary | ICD-10-CM | POA: Diagnosis not present

## 2017-09-25 DIAGNOSIS — Z8701 Personal history of pneumonia (recurrent): Secondary | ICD-10-CM | POA: Diagnosis not present

## 2017-09-25 DIAGNOSIS — I10 Essential (primary) hypertension: Secondary | ICD-10-CM | POA: Diagnosis not present

## 2017-09-25 DIAGNOSIS — D509 Iron deficiency anemia, unspecified: Secondary | ICD-10-CM | POA: Diagnosis not present

## 2017-09-25 DIAGNOSIS — M6281 Muscle weakness (generalized): Secondary | ICD-10-CM | POA: Diagnosis not present

## 2017-09-25 DIAGNOSIS — E1140 Type 2 diabetes mellitus with diabetic neuropathy, unspecified: Secondary | ICD-10-CM | POA: Diagnosis not present

## 2017-09-25 NOTE — Telephone Encounter (Signed)
Called and left a VM for [name] to call back

## 2017-09-25 NOTE — Telephone Encounter (Signed)
Called and left a VM for [name] to call back.

## 2017-09-26 DIAGNOSIS — H35033 Hypertensive retinopathy, bilateral: Secondary | ICD-10-CM | POA: Diagnosis not present

## 2017-09-26 DIAGNOSIS — H35433 Paving stone degeneration of retina, bilateral: Secondary | ICD-10-CM | POA: Diagnosis not present

## 2017-09-26 DIAGNOSIS — H34832 Tributary (branch) retinal vein occlusion, left eye, with macular edema: Secondary | ICD-10-CM | POA: Diagnosis not present

## 2017-09-26 DIAGNOSIS — H353132 Nonexudative age-related macular degeneration, bilateral, intermediate dry stage: Secondary | ICD-10-CM | POA: Diagnosis not present

## 2017-09-26 NOTE — Telephone Encounter (Signed)
Called and spoke with Claiborne Billings. [place] for verbal order have been given.

## 2017-09-30 DIAGNOSIS — Z8701 Personal history of pneumonia (recurrent): Secondary | ICD-10-CM | POA: Diagnosis not present

## 2017-09-30 DIAGNOSIS — I10 Essential (primary) hypertension: Secondary | ICD-10-CM | POA: Diagnosis not present

## 2017-09-30 DIAGNOSIS — E1140 Type 2 diabetes mellitus with diabetic neuropathy, unspecified: Secondary | ICD-10-CM | POA: Diagnosis not present

## 2017-09-30 DIAGNOSIS — F0390 Unspecified dementia without behavioral disturbance: Secondary | ICD-10-CM | POA: Diagnosis not present

## 2017-09-30 DIAGNOSIS — M6281 Muscle weakness (generalized): Secondary | ICD-10-CM | POA: Diagnosis not present

## 2017-09-30 DIAGNOSIS — D509 Iron deficiency anemia, unspecified: Secondary | ICD-10-CM | POA: Diagnosis not present

## 2017-10-01 ENCOUNTER — Telehealth: Payer: Self-pay | Admitting: Family Medicine

## 2017-10-01 DIAGNOSIS — Z1283 Encounter for screening for malignant neoplasm of skin: Secondary | ICD-10-CM | POA: Diagnosis not present

## 2017-10-01 DIAGNOSIS — Z85828 Personal history of other malignant neoplasm of skin: Secondary | ICD-10-CM | POA: Diagnosis not present

## 2017-10-01 DIAGNOSIS — C44619 Basal cell carcinoma of skin of left upper limb, including shoulder: Secondary | ICD-10-CM | POA: Diagnosis not present

## 2017-10-01 DIAGNOSIS — L820 Inflamed seborrheic keratosis: Secondary | ICD-10-CM | POA: Diagnosis not present

## 2017-10-01 DIAGNOSIS — C4442 Squamous cell carcinoma of skin of scalp and neck: Secondary | ICD-10-CM | POA: Diagnosis not present

## 2017-10-01 DIAGNOSIS — D045 Carcinoma in situ of skin of trunk: Secondary | ICD-10-CM | POA: Diagnosis not present

## 2017-10-01 DIAGNOSIS — Z08 Encounter for follow-up examination after completed treatment for malignant neoplasm: Secondary | ICD-10-CM | POA: Diagnosis not present

## 2017-10-01 NOTE — Telephone Encounter (Signed)
Please call in these orders  

## 2017-10-01 NOTE — Telephone Encounter (Signed)
Sent to PCP to advise 

## 2017-10-01 NOTE — Telephone Encounter (Signed)
Copied from [place]. Topic: Inquiry >> Oct 01, 2017  2:49 PM Pricilla Handler wrote: Reason for CRM: Belenda Cruise with [facility] (Phn # [phone]) called requesting the following OT verbal orders: 1 time a week for 2 weeks, 2 times a week for 2 weeks, 1 time a week for 1 week. Please call Belenda Cruise at [phone].       Thank You!!!

## 2017-10-02 DIAGNOSIS — E1140 Type 2 diabetes mellitus with diabetic neuropathy, unspecified: Secondary | ICD-10-CM | POA: Diagnosis not present

## 2017-10-02 DIAGNOSIS — M6281 Muscle weakness (generalized): Secondary | ICD-10-CM | POA: Diagnosis not present

## 2017-10-02 DIAGNOSIS — Z8701 Personal history of pneumonia (recurrent): Secondary | ICD-10-CM | POA: Diagnosis not present

## 2017-10-02 DIAGNOSIS — I10 Essential (primary) hypertension: Secondary | ICD-10-CM | POA: Diagnosis not present

## 2017-10-02 DIAGNOSIS — D509 Iron deficiency anemia, unspecified: Secondary | ICD-10-CM | POA: Diagnosis not present

## 2017-10-02 DIAGNOSIS — F0390 Unspecified dementia without behavioral disturbance: Secondary | ICD-10-CM | POA: Diagnosis not present

## 2017-10-02 NOTE — Telephone Encounter (Signed)
Called and spoke with Belenda Cruise. [place] for verbal orders have been given.

## 2017-10-03 DIAGNOSIS — Z8701 Personal history of pneumonia (recurrent): Secondary | ICD-10-CM | POA: Diagnosis not present

## 2017-10-03 DIAGNOSIS — F0390 Unspecified dementia without behavioral disturbance: Secondary | ICD-10-CM | POA: Diagnosis not present

## 2017-10-03 DIAGNOSIS — E1140 Type 2 diabetes mellitus with diabetic neuropathy, unspecified: Secondary | ICD-10-CM | POA: Diagnosis not present

## 2017-10-03 DIAGNOSIS — I10 Essential (primary) hypertension: Secondary | ICD-10-CM | POA: Diagnosis not present

## 2017-10-03 DIAGNOSIS — D509 Iron deficiency anemia, unspecified: Secondary | ICD-10-CM | POA: Diagnosis not present

## 2017-10-03 DIAGNOSIS — M6281 Muscle weakness (generalized): Secondary | ICD-10-CM | POA: Diagnosis not present

## 2017-10-04 ENCOUNTER — Telehealth: Payer: Self-pay | Admitting: Family Medicine

## 2017-10-04 NOTE — Telephone Encounter (Signed)
Copied from [place]. Topic: Quick Communication - See Telephone Encounter >> Oct 04, 2017 10:25 AM Conception Chancy, NT wrote: CRM for notification. See Telephone encounter for: 10/04/17.  Patient wife is calling to give an update from new medications prescribed 09/23/17. She states risperiDONE (RISPERDAL) 1 MG tablet is not working. She would like to know can something else be called in. Please contact pt.   [facility] - [place], [address] AT [facility] [address]  [place] [address]  Phone: [phone] Fax: [phone]

## 2017-10-04 NOTE — Telephone Encounter (Signed)
Pt missed call, return call to pt

## 2017-10-04 NOTE — Telephone Encounter (Signed)
Called pt on both mobile and cell number and left a VM to call back.

## 2017-10-04 NOTE — Telephone Encounter (Signed)
Tell them to increase the Risperdal to two tabs (a total of 2 mg) at bedtime

## 2017-10-04 NOTE — Telephone Encounter (Signed)
Sent to PCP to advise 

## 2017-10-07 DIAGNOSIS — I10 Essential (primary) hypertension: Secondary | ICD-10-CM | POA: Diagnosis not present

## 2017-10-07 DIAGNOSIS — M6281 Muscle weakness (generalized): Secondary | ICD-10-CM | POA: Diagnosis not present

## 2017-10-07 DIAGNOSIS — F0390 Unspecified dementia without behavioral disturbance: Secondary | ICD-10-CM | POA: Diagnosis not present

## 2017-10-07 DIAGNOSIS — D509 Iron deficiency anemia, unspecified: Secondary | ICD-10-CM | POA: Diagnosis not present

## 2017-10-07 DIAGNOSIS — Z8701 Personal history of pneumonia (recurrent): Secondary | ICD-10-CM | POA: Diagnosis not present

## 2017-10-07 DIAGNOSIS — E1140 Type 2 diabetes mellitus with diabetic neuropathy, unspecified: Secondary | ICD-10-CM | POA: Diagnosis not present

## 2017-10-07 NOTE — Telephone Encounter (Signed)
Increase the Risperdal to 3 tabs (3 mg total) at bedtime for a few nights

## 2017-10-07 NOTE — Telephone Encounter (Signed)
Sent to PCP to advise 

## 2017-10-07 NOTE — Telephone Encounter (Signed)
Spoke with pt's son, Nicki Reaper. He states that they have given pt the 2 tabs of Risperidone for 2 nights. The waking and restlessness have improved some, but pt is still getting up several times a night. They would like to know if you recommend continuing or if they should switch to another medication?  Please call Nicki Reaper ([place]) [phone]   Dr. Sarajane Jews - Please advise. Thanks!

## 2017-10-07 NOTE — Telephone Encounter (Signed)
Called and spoke with pt's son. Son advised and voiced understand. Pt's son stated that they will only have enough medication to last for the next 5 days. The will need this medication refill with new directions. Sent to PCP for approval.

## 2017-10-08 ENCOUNTER — Ambulatory Visit: Payer: Medicare Other | Admitting: Cardiology

## 2017-10-08 ENCOUNTER — Telehealth: Payer: Self-pay | Admitting: Family Medicine

## 2017-10-08 DIAGNOSIS — I10 Essential (primary) hypertension: Secondary | ICD-10-CM | POA: Diagnosis not present

## 2017-10-08 DIAGNOSIS — F0390 Unspecified dementia without behavioral disturbance: Secondary | ICD-10-CM | POA: Diagnosis not present

## 2017-10-08 DIAGNOSIS — D509 Iron deficiency anemia, unspecified: Secondary | ICD-10-CM | POA: Diagnosis not present

## 2017-10-08 DIAGNOSIS — M6281 Muscle weakness (generalized): Secondary | ICD-10-CM | POA: Diagnosis not present

## 2017-10-08 DIAGNOSIS — Z8701 Personal history of pneumonia (recurrent): Secondary | ICD-10-CM | POA: Diagnosis not present

## 2017-10-08 DIAGNOSIS — E1140 Type 2 diabetes mellitus with diabetic neuropathy, unspecified: Secondary | ICD-10-CM | POA: Diagnosis not present

## 2017-10-08 NOTE — Telephone Encounter (Signed)
Sent to PCP ?

## 2017-10-08 NOTE — Telephone Encounter (Signed)
Copied from [place] [phone]. Topic: Quick Communication - See Telephone Encounter >> Oct 08, 2017  2:45 PM [place], Laban Emperor wrote: CRM for notification. See Telephone encounter for: 10/08/17.  Beth with [facility] calling to request verbal orders nursing evaluation for pressure sore right buttock.  [phone]

## 2017-10-09 ENCOUNTER — Ambulatory Visit: Payer: Self-pay | Admitting: *Deleted

## 2017-10-09 DIAGNOSIS — I482 Chronic atrial fibrillation: Secondary | ICD-10-CM | POA: Diagnosis not present

## 2017-10-09 DIAGNOSIS — E785 Hyperlipidemia, unspecified: Secondary | ICD-10-CM | POA: Diagnosis not present

## 2017-10-09 DIAGNOSIS — Z7982 Long term (current) use of aspirin: Secondary | ICD-10-CM | POA: Diagnosis not present

## 2017-10-09 DIAGNOSIS — R1310 Dysphagia, unspecified: Secondary | ICD-10-CM | POA: Diagnosis not present

## 2017-10-09 DIAGNOSIS — F0390 Unspecified dementia without behavioral disturbance: Secondary | ICD-10-CM | POA: Diagnosis not present

## 2017-10-09 DIAGNOSIS — I10 Essential (primary) hypertension: Secondary | ICD-10-CM | POA: Diagnosis not present

## 2017-10-09 DIAGNOSIS — E1140 Type 2 diabetes mellitus with diabetic neuropathy, unspecified: Secondary | ICD-10-CM | POA: Diagnosis not present

## 2017-10-09 DIAGNOSIS — Z8701 Personal history of pneumonia (recurrent): Secondary | ICD-10-CM | POA: Diagnosis not present

## 2017-10-09 DIAGNOSIS — Z7901 Long term (current) use of anticoagulants: Secondary | ICD-10-CM | POA: Diagnosis not present

## 2017-10-09 DIAGNOSIS — M6281 Muscle weakness (generalized): Secondary | ICD-10-CM | POA: Diagnosis not present

## 2017-10-09 DIAGNOSIS — D509 Iron deficiency anemia, unspecified: Secondary | ICD-10-CM | POA: Diagnosis not present

## 2017-10-09 DIAGNOSIS — Z7984 Long term (current) use of oral hypoglycemic drugs: Secondary | ICD-10-CM | POA: Diagnosis not present

## 2017-10-09 MED ORDER — RISPERIDONE 1 MG PO TABS: 3.0000 mg | ORAL_TABLET | Freq: Every day | ORAL | 5 refills | Status: DC

## 2017-10-09 NOTE — Telephone Encounter (Signed)
Call in Risperdal 3 mg to take qhs, #30 with 5 rf

## 2017-10-09 NOTE — Telephone Encounter (Signed)
Sent to PCP ?

## 2017-10-09 NOTE — Telephone Encounter (Signed)
Called [place] and left a VM with pt's name and DOB and gave the OK for verbal orders that were requested. Left our phone number if Beth needed to call back.

## 2017-10-09 NOTE — Telephone Encounter (Signed)
BP- 148/58 some dizziness- patient is stable and walking , no slurred speech or headache Baseline BP- 120/130/ 60  No missed doses.

## 2017-10-09 NOTE — Telephone Encounter (Signed)
Please okay these orders  ?

## 2017-10-09 NOTE — Telephone Encounter (Signed)
Medication has been sent into pt's pharmacy.

## 2017-10-10 ENCOUNTER — Telehealth: Payer: Self-pay | Admitting: Family Medicine

## 2017-10-10 DIAGNOSIS — Z8701 Personal history of pneumonia (recurrent): Secondary | ICD-10-CM | POA: Diagnosis not present

## 2017-10-10 DIAGNOSIS — E1140 Type 2 diabetes mellitus with diabetic neuropathy, unspecified: Secondary | ICD-10-CM | POA: Diagnosis not present

## 2017-10-10 DIAGNOSIS — F0390 Unspecified dementia without behavioral disturbance: Secondary | ICD-10-CM | POA: Diagnosis not present

## 2017-10-10 DIAGNOSIS — I10 Essential (primary) hypertension: Secondary | ICD-10-CM | POA: Diagnosis not present

## 2017-10-10 DIAGNOSIS — D509 Iron deficiency anemia, unspecified: Secondary | ICD-10-CM | POA: Diagnosis not present

## 2017-10-10 DIAGNOSIS — M6281 Muscle weakness (generalized): Secondary | ICD-10-CM | POA: Diagnosis not present

## 2017-10-10 NOTE — Telephone Encounter (Signed)
Copied from [place]. Topic: Quick Communication - See Telephone Encounter >> Oct 10, 2017  1:21 PM Aurelio Brash B wrote: CRM for notification. See Telephone encounter for: 10/10/17. Claiborne Billings, PT from [facility] is asking for verbal orders to continue home PT   3 to 4 more weeks   [phone]

## 2017-10-11 NOTE — Telephone Encounter (Signed)
Called [name] and left her a detailed VM with pt's name and DOB gave her the OK for verbal orders requested. Ambrose Pancoast to call back if needed left our call back number.

## 2017-10-11 NOTE — Telephone Encounter (Signed)
Sent to PCP for approval.  

## 2017-10-11 NOTE — Telephone Encounter (Signed)
Please okay these orders  ?

## 2017-10-11 NOTE — Telephone Encounter (Signed)
Sounds good

## 2017-10-12 DIAGNOSIS — M6281 Muscle weakness (generalized): Secondary | ICD-10-CM | POA: Diagnosis not present

## 2017-10-12 DIAGNOSIS — I10 Essential (primary) hypertension: Secondary | ICD-10-CM | POA: Diagnosis not present

## 2017-10-12 DIAGNOSIS — F0390 Unspecified dementia without behavioral disturbance: Secondary | ICD-10-CM | POA: Diagnosis not present

## 2017-10-12 DIAGNOSIS — D509 Iron deficiency anemia, unspecified: Secondary | ICD-10-CM | POA: Diagnosis not present

## 2017-10-12 DIAGNOSIS — Z8701 Personal history of pneumonia (recurrent): Secondary | ICD-10-CM | POA: Diagnosis not present

## 2017-10-12 DIAGNOSIS — E1140 Type 2 diabetes mellitus with diabetic neuropathy, unspecified: Secondary | ICD-10-CM | POA: Diagnosis not present

## 2017-10-14 DIAGNOSIS — E1140 Type 2 diabetes mellitus with diabetic neuropathy, unspecified: Secondary | ICD-10-CM | POA: Diagnosis not present

## 2017-10-14 DIAGNOSIS — I10 Essential (primary) hypertension: Secondary | ICD-10-CM | POA: Diagnosis not present

## 2017-10-14 DIAGNOSIS — D509 Iron deficiency anemia, unspecified: Secondary | ICD-10-CM | POA: Diagnosis not present

## 2017-10-14 DIAGNOSIS — M6281 Muscle weakness (generalized): Secondary | ICD-10-CM | POA: Diagnosis not present

## 2017-10-14 DIAGNOSIS — F0390 Unspecified dementia without behavioral disturbance: Secondary | ICD-10-CM | POA: Diagnosis not present

## 2017-10-14 DIAGNOSIS — Z8701 Personal history of pneumonia (recurrent): Secondary | ICD-10-CM | POA: Diagnosis not present

## 2017-10-15 ENCOUNTER — Other Ambulatory Visit: Payer: Self-pay | Admitting: Family Medicine

## 2017-10-15 DIAGNOSIS — I10 Essential (primary) hypertension: Secondary | ICD-10-CM | POA: Diagnosis not present

## 2017-10-15 DIAGNOSIS — E1140 Type 2 diabetes mellitus with diabetic neuropathy, unspecified: Secondary | ICD-10-CM | POA: Diagnosis not present

## 2017-10-15 DIAGNOSIS — F0390 Unspecified dementia without behavioral disturbance: Secondary | ICD-10-CM | POA: Diagnosis not present

## 2017-10-15 DIAGNOSIS — Z8701 Personal history of pneumonia (recurrent): Secondary | ICD-10-CM | POA: Diagnosis not present

## 2017-10-15 DIAGNOSIS — M6281 Muscle weakness (generalized): Secondary | ICD-10-CM | POA: Diagnosis not present

## 2017-10-15 DIAGNOSIS — D509 Iron deficiency anemia, unspecified: Secondary | ICD-10-CM | POA: Diagnosis not present

## 2017-10-17 DIAGNOSIS — D509 Iron deficiency anemia, unspecified: Secondary | ICD-10-CM | POA: Diagnosis not present

## 2017-10-17 DIAGNOSIS — M6281 Muscle weakness (generalized): Secondary | ICD-10-CM | POA: Diagnosis not present

## 2017-10-17 DIAGNOSIS — F0390 Unspecified dementia without behavioral disturbance: Secondary | ICD-10-CM | POA: Diagnosis not present

## 2017-10-17 DIAGNOSIS — E1140 Type 2 diabetes mellitus with diabetic neuropathy, unspecified: Secondary | ICD-10-CM | POA: Diagnosis not present

## 2017-10-17 DIAGNOSIS — I10 Essential (primary) hypertension: Secondary | ICD-10-CM | POA: Diagnosis not present

## 2017-10-17 DIAGNOSIS — Z8701 Personal history of pneumonia (recurrent): Secondary | ICD-10-CM | POA: Diagnosis not present

## 2017-10-18 DIAGNOSIS — M6281 Muscle weakness (generalized): Secondary | ICD-10-CM | POA: Diagnosis not present

## 2017-10-18 DIAGNOSIS — Z8701 Personal history of pneumonia (recurrent): Secondary | ICD-10-CM | POA: Diagnosis not present

## 2017-10-18 DIAGNOSIS — D509 Iron deficiency anemia, unspecified: Secondary | ICD-10-CM | POA: Diagnosis not present

## 2017-10-18 DIAGNOSIS — I10 Essential (primary) hypertension: Secondary | ICD-10-CM | POA: Diagnosis not present

## 2017-10-18 DIAGNOSIS — F0390 Unspecified dementia without behavioral disturbance: Secondary | ICD-10-CM | POA: Diagnosis not present

## 2017-10-18 DIAGNOSIS — E1140 Type 2 diabetes mellitus with diabetic neuropathy, unspecified: Secondary | ICD-10-CM | POA: Diagnosis not present

## 2017-10-21 ENCOUNTER — Telehealth: Payer: Self-pay | Admitting: Family Medicine

## 2017-10-21 NOTE — Telephone Encounter (Signed)
Copied from [place] [phone]. Topic: General - Other >> Oct 21, 2017  2:25 PM Valla Leaver wrote: Reason for CRM: Margaretha Sheffield, RN with [facility], calling to inform that the patient has a sacral wound and needs an order for treatment of the wound. She wants to know if Secura and calmaseptine zinc barrier 2x a day over coccyx area and buttox would be approved?

## 2017-10-21 NOTE — Telephone Encounter (Signed)
Sent to PCP ?

## 2017-10-22 DIAGNOSIS — L602 Onychogryphosis: Secondary | ICD-10-CM | POA: Diagnosis not present

## 2017-10-22 DIAGNOSIS — E1351 Other specified diabetes mellitus with diabetic peripheral angiopathy without gangrene: Secondary | ICD-10-CM | POA: Diagnosis not present

## 2017-10-22 DIAGNOSIS — R2689 Other abnormalities of gait and mobility: Secondary | ICD-10-CM | POA: Diagnosis not present

## 2017-10-22 NOTE — Telephone Encounter (Signed)
Please okay this order  ?

## 2017-10-22 NOTE — Telephone Encounter (Signed)
Called and spoke wit Andrew Norton and gave her the OK for verbal orders requested.

## 2017-10-23 ENCOUNTER — Telehealth: Payer: Self-pay | Admitting: Family Medicine

## 2017-10-23 DIAGNOSIS — E1140 Type 2 diabetes mellitus with diabetic neuropathy, unspecified: Secondary | ICD-10-CM | POA: Diagnosis not present

## 2017-10-23 DIAGNOSIS — M6281 Muscle weakness (generalized): Secondary | ICD-10-CM | POA: Diagnosis not present

## 2017-10-23 DIAGNOSIS — Z8701 Personal history of pneumonia (recurrent): Secondary | ICD-10-CM | POA: Diagnosis not present

## 2017-10-23 DIAGNOSIS — I10 Essential (primary) hypertension: Secondary | ICD-10-CM | POA: Diagnosis not present

## 2017-10-23 DIAGNOSIS — F0390 Unspecified dementia without behavioral disturbance: Secondary | ICD-10-CM | POA: Diagnosis not present

## 2017-10-23 DIAGNOSIS — D509 Iron deficiency anemia, unspecified: Secondary | ICD-10-CM | POA: Diagnosis not present

## 2017-10-23 NOTE — Telephone Encounter (Signed)
Copied from [place] [phone]. >> Oct 23, 2017  1:10 PM Margot Ables wrote: Calling to report fall last Wednesday 10/16/17. No injuries reported. Pt denies hitting his head.

## 2017-10-23 NOTE — Telephone Encounter (Signed)
Noted  

## 2017-10-23 NOTE — Telephone Encounter (Signed)
Sent to PCP as an FYI  

## 2017-10-24 ENCOUNTER — Other Ambulatory Visit: Payer: Self-pay | Admitting: Family Medicine

## 2017-10-24 DIAGNOSIS — E1140 Type 2 diabetes mellitus with diabetic neuropathy, unspecified: Secondary | ICD-10-CM | POA: Diagnosis not present

## 2017-10-24 DIAGNOSIS — Z8701 Personal history of pneumonia (recurrent): Secondary | ICD-10-CM | POA: Diagnosis not present

## 2017-10-24 DIAGNOSIS — F0390 Unspecified dementia without behavioral disturbance: Secondary | ICD-10-CM | POA: Diagnosis not present

## 2017-10-24 DIAGNOSIS — M6281 Muscle weakness (generalized): Secondary | ICD-10-CM | POA: Diagnosis not present

## 2017-10-24 DIAGNOSIS — I10 Essential (primary) hypertension: Secondary | ICD-10-CM | POA: Diagnosis not present

## 2017-10-24 DIAGNOSIS — D509 Iron deficiency anemia, unspecified: Secondary | ICD-10-CM | POA: Diagnosis not present

## 2017-10-25 ENCOUNTER — Other Ambulatory Visit: Payer: Self-pay

## 2017-10-25 ENCOUNTER — Other Ambulatory Visit: Payer: Self-pay | Admitting: *Deleted

## 2017-10-25 DIAGNOSIS — I10 Essential (primary) hypertension: Secondary | ICD-10-CM | POA: Diagnosis not present

## 2017-10-25 DIAGNOSIS — Z8701 Personal history of pneumonia (recurrent): Secondary | ICD-10-CM | POA: Diagnosis not present

## 2017-10-25 DIAGNOSIS — F0390 Unspecified dementia without behavioral disturbance: Secondary | ICD-10-CM | POA: Diagnosis not present

## 2017-10-25 DIAGNOSIS — M6281 Muscle weakness (generalized): Secondary | ICD-10-CM | POA: Diagnosis not present

## 2017-10-25 DIAGNOSIS — E1140 Type 2 diabetes mellitus with diabetic neuropathy, unspecified: Secondary | ICD-10-CM | POA: Diagnosis not present

## 2017-10-25 DIAGNOSIS — D509 Iron deficiency anemia, unspecified: Secondary | ICD-10-CM | POA: Diagnosis not present

## 2017-10-25 MED ORDER — RIVAROXABAN 20 MG PO TABS: 20.0000 mg | ORAL_TABLET | Freq: Every day | ORAL | 2 refills | Status: DC

## 2017-10-25 MED ORDER — RIVAROXABAN 20 MG PO TABS: 20.0000 mg | ORAL_TABLET | Freq: Every day | ORAL | 2 refills | Status: AC

## 2017-10-25 NOTE — Telephone Encounter (Signed)
Xarelto 20mg  refill request received; pt is 84 yrs old, wt-67.1kg, Crea-0.68 on 08/28/17, last seen by Dr. Curt Bears on 09/18/17, CrCl-69.84ml/min; will send in refill to requested Pharmacy.

## 2017-10-25 NOTE — Telephone Encounter (Signed)
Xarelto 20mg  paper refill received from [facility]; pt is 84 yrs old, Wt--69kg, Crea-0.68 on 08/28/17, last seen by Dr. Curt Bears on 09/18/17, CrCl-71.2ml/min. Will send in refill to requested Pharmacy.

## 2017-10-28 ENCOUNTER — Telehealth: Payer: Self-pay | Admitting: Family Medicine

## 2017-10-28 DIAGNOSIS — Z8701 Personal history of pneumonia (recurrent): Secondary | ICD-10-CM | POA: Diagnosis not present

## 2017-10-28 DIAGNOSIS — E1140 Type 2 diabetes mellitus with diabetic neuropathy, unspecified: Secondary | ICD-10-CM | POA: Diagnosis not present

## 2017-10-28 DIAGNOSIS — F0390 Unspecified dementia without behavioral disturbance: Secondary | ICD-10-CM | POA: Diagnosis not present

## 2017-10-28 DIAGNOSIS — M6281 Muscle weakness (generalized): Secondary | ICD-10-CM | POA: Diagnosis not present

## 2017-10-28 DIAGNOSIS — I10 Essential (primary) hypertension: Secondary | ICD-10-CM | POA: Diagnosis not present

## 2017-10-28 DIAGNOSIS — D509 Iron deficiency anemia, unspecified: Secondary | ICD-10-CM | POA: Diagnosis not present

## 2017-10-28 NOTE — Telephone Encounter (Signed)
Please okay these orders  ?

## 2017-10-28 NOTE — Telephone Encounter (Signed)
Sent to PCP for the Ok for verbal orders  

## 2017-10-28 NOTE — Telephone Encounter (Signed)
Copied from [place] [id number]. Topic: Quick Communication - See Telephone Encounter >> Oct 28, 2017 12:58 PM Hewitt Shorts wrote: CRM for notification. See Telephone encounter for: 10/28/17. [facility] is needing to get verbal orders for OT 1 time 4 weeks and home health 2 times 3 weeks for bathing only   [facility] number [phone]

## 2017-10-29 DIAGNOSIS — C4442 Squamous cell carcinoma of skin of scalp and neck: Secondary | ICD-10-CM | POA: Diagnosis not present

## 2017-10-29 DIAGNOSIS — Z08 Encounter for follow-up examination after completed treatment for malignant neoplasm: Secondary | ICD-10-CM | POA: Diagnosis not present

## 2017-10-29 DIAGNOSIS — X32XXXD Exposure to sunlight, subsequent encounter: Secondary | ICD-10-CM | POA: Diagnosis not present

## 2017-10-29 DIAGNOSIS — L570 Actinic keratosis: Secondary | ICD-10-CM | POA: Diagnosis not present

## 2017-10-29 DIAGNOSIS — L820 Inflamed seborrheic keratosis: Secondary | ICD-10-CM | POA: Diagnosis not present

## 2017-10-29 DIAGNOSIS — C44321 Squamous cell carcinoma of skin of nose: Secondary | ICD-10-CM | POA: Diagnosis not present

## 2017-10-29 DIAGNOSIS — C44329 Squamous cell carcinoma of skin of other parts of face: Secondary | ICD-10-CM | POA: Diagnosis not present

## 2017-10-29 DIAGNOSIS — Z85828 Personal history of other malignant neoplasm of skin: Secondary | ICD-10-CM | POA: Diagnosis not present

## 2017-10-29 NOTE — Telephone Encounter (Signed)
Called and spoke with Barnetta Chapel OK for verbal orders given

## 2017-10-30 DIAGNOSIS — Z8701 Personal history of pneumonia (recurrent): Secondary | ICD-10-CM | POA: Diagnosis not present

## 2017-10-30 DIAGNOSIS — F0390 Unspecified dementia without behavioral disturbance: Secondary | ICD-10-CM | POA: Diagnosis not present

## 2017-10-30 DIAGNOSIS — E1140 Type 2 diabetes mellitus with diabetic neuropathy, unspecified: Secondary | ICD-10-CM | POA: Diagnosis not present

## 2017-10-30 DIAGNOSIS — I10 Essential (primary) hypertension: Secondary | ICD-10-CM | POA: Diagnosis not present

## 2017-10-30 DIAGNOSIS — M6281 Muscle weakness (generalized): Secondary | ICD-10-CM | POA: Diagnosis not present

## 2017-10-30 DIAGNOSIS — D509 Iron deficiency anemia, unspecified: Secondary | ICD-10-CM | POA: Diagnosis not present

## 2017-11-01 DIAGNOSIS — H353132 Nonexudative age-related macular degeneration, bilateral, intermediate dry stage: Secondary | ICD-10-CM | POA: Diagnosis not present

## 2017-11-01 DIAGNOSIS — Z961 Presence of intraocular lens: Secondary | ICD-10-CM | POA: Diagnosis not present

## 2017-11-01 DIAGNOSIS — H524 Presbyopia: Secondary | ICD-10-CM | POA: Diagnosis not present

## 2017-11-01 DIAGNOSIS — E119 Type 2 diabetes mellitus without complications: Secondary | ICD-10-CM | POA: Diagnosis not present

## 2017-11-01 LAB — HM DIABETES EYE EXAM

## 2017-11-04 DIAGNOSIS — S0100XA Unspecified open wound of scalp, initial encounter: Secondary | ICD-10-CM | POA: Diagnosis not present

## 2017-11-04 DIAGNOSIS — C4442 Squamous cell carcinoma of skin of scalp and neck: Secondary | ICD-10-CM | POA: Diagnosis not present

## 2017-11-04 DIAGNOSIS — Z85828 Personal history of other malignant neoplasm of skin: Secondary | ICD-10-CM | POA: Diagnosis not present

## 2017-11-05 ENCOUNTER — Ambulatory Visit (INDEPENDENT_AMBULATORY_CARE_PROVIDER_SITE_OTHER): Payer: Medicare Other | Admitting: Family Medicine

## 2017-11-05 DIAGNOSIS — E538 Deficiency of other specified B group vitamins: Secondary | ICD-10-CM

## 2017-11-05 DIAGNOSIS — I10 Essential (primary) hypertension: Secondary | ICD-10-CM | POA: Diagnosis not present

## 2017-11-05 DIAGNOSIS — F0390 Unspecified dementia without behavioral disturbance: Secondary | ICD-10-CM | POA: Diagnosis not present

## 2017-11-05 DIAGNOSIS — Z8701 Personal history of pneumonia (recurrent): Secondary | ICD-10-CM | POA: Diagnosis not present

## 2017-11-05 DIAGNOSIS — E1140 Type 2 diabetes mellitus with diabetic neuropathy, unspecified: Secondary | ICD-10-CM | POA: Diagnosis not present

## 2017-11-05 DIAGNOSIS — D509 Iron deficiency anemia, unspecified: Secondary | ICD-10-CM | POA: Diagnosis not present

## 2017-11-05 DIAGNOSIS — M6281 Muscle weakness (generalized): Secondary | ICD-10-CM | POA: Diagnosis not present

## 2017-11-05 MED ORDER — CYANOCOBALAMIN 1000 MCG/ML IJ SOLN
1000.0000 ug | Freq: Once | INTRAMUSCULAR | Status: AC
  Administered 2017-11-05: 1000 ug via INTRAMUSCULAR

## 2017-11-05 NOTE — Progress Notes (Signed)
Per orders of Dr. Fry, injection of Vitamin B 12 given by NIMMONS, SYLVIA ANN. Patient tolerated injection well. 

## 2017-11-06 DIAGNOSIS — E1140 Type 2 diabetes mellitus with diabetic neuropathy, unspecified: Secondary | ICD-10-CM | POA: Diagnosis not present

## 2017-11-06 DIAGNOSIS — M6281 Muscle weakness (generalized): Secondary | ICD-10-CM | POA: Diagnosis not present

## 2017-11-06 DIAGNOSIS — F0390 Unspecified dementia without behavioral disturbance: Secondary | ICD-10-CM | POA: Diagnosis not present

## 2017-11-06 DIAGNOSIS — D509 Iron deficiency anemia, unspecified: Secondary | ICD-10-CM | POA: Diagnosis not present

## 2017-11-06 DIAGNOSIS — Z8701 Personal history of pneumonia (recurrent): Secondary | ICD-10-CM | POA: Diagnosis not present

## 2017-11-06 DIAGNOSIS — I10 Essential (primary) hypertension: Secondary | ICD-10-CM | POA: Diagnosis not present

## 2017-11-07 ENCOUNTER — Telehealth: Payer: Self-pay | Admitting: Family Medicine

## 2017-11-07 DIAGNOSIS — M6281 Muscle weakness (generalized): Secondary | ICD-10-CM | POA: Diagnosis not present

## 2017-11-07 DIAGNOSIS — E1140 Type 2 diabetes mellitus with diabetic neuropathy, unspecified: Secondary | ICD-10-CM | POA: Diagnosis not present

## 2017-11-07 DIAGNOSIS — Z8701 Personal history of pneumonia (recurrent): Secondary | ICD-10-CM | POA: Diagnosis not present

## 2017-11-07 DIAGNOSIS — I10 Essential (primary) hypertension: Secondary | ICD-10-CM | POA: Diagnosis not present

## 2017-11-07 DIAGNOSIS — F0390 Unspecified dementia without behavioral disturbance: Secondary | ICD-10-CM | POA: Diagnosis not present

## 2017-11-07 DIAGNOSIS — D509 Iron deficiency anemia, unspecified: Secondary | ICD-10-CM | POA: Diagnosis not present

## 2017-11-07 NOTE — Telephone Encounter (Signed)
Called and spoke with Claiborne Billings. Claiborne Billings advised and voiced understanding.

## 2017-11-07 NOTE — Telephone Encounter (Signed)
Mix one cup of warm prune juice with a couple table spoons of olive oil   If that does not work, tomorrow can drink a bottle of Magnesium Citrate - can be bought at any pharmacy

## 2017-11-07 NOTE — Telephone Encounter (Signed)
Copied from [place] #[id number]. Topic: Inquiry >> Nov 07, 2017 12:18 PM Oliver Pila B wrote: Reason for CRM: Claiborne Billings from [facility] called to state pt has not had a bowel movement since Saturday, other methods have been done to get him to use the bathroom but has not worked, Education officer, environmental @ [phone]

## 2017-11-07 NOTE — Telephone Encounter (Signed)
Called and spoke with Claiborne Billings   She stated that she really does want to have to send the pt to the ER   Pt has tried the following   *mirlax *supposatory *mac-citra   They have NOT tried an enema yet   Sent to another provider to advise suggestions for the pt

## 2017-11-08 DIAGNOSIS — I10 Essential (primary) hypertension: Secondary | ICD-10-CM | POA: Diagnosis not present

## 2017-11-08 DIAGNOSIS — M6281 Muscle weakness (generalized): Secondary | ICD-10-CM | POA: Diagnosis not present

## 2017-11-08 DIAGNOSIS — E1140 Type 2 diabetes mellitus with diabetic neuropathy, unspecified: Secondary | ICD-10-CM | POA: Diagnosis not present

## 2017-11-08 DIAGNOSIS — D509 Iron deficiency anemia, unspecified: Secondary | ICD-10-CM | POA: Diagnosis not present

## 2017-11-08 DIAGNOSIS — F0390 Unspecified dementia without behavioral disturbance: Secondary | ICD-10-CM | POA: Diagnosis not present

## 2017-11-08 DIAGNOSIS — Z8701 Personal history of pneumonia (recurrent): Secondary | ICD-10-CM | POA: Diagnosis not present

## 2017-11-11 ENCOUNTER — Telehealth: Payer: Self-pay | Admitting: Family Medicine

## 2017-11-11 DIAGNOSIS — M6281 Muscle weakness (generalized): Secondary | ICD-10-CM | POA: Diagnosis not present

## 2017-11-11 DIAGNOSIS — F0390 Unspecified dementia without behavioral disturbance: Secondary | ICD-10-CM | POA: Diagnosis not present

## 2017-11-11 DIAGNOSIS — I10 Essential (primary) hypertension: Secondary | ICD-10-CM | POA: Diagnosis not present

## 2017-11-11 DIAGNOSIS — Z8701 Personal history of pneumonia (recurrent): Secondary | ICD-10-CM | POA: Diagnosis not present

## 2017-11-11 DIAGNOSIS — D509 Iron deficiency anemia, unspecified: Secondary | ICD-10-CM | POA: Diagnosis not present

## 2017-11-11 DIAGNOSIS — E1140 Type 2 diabetes mellitus with diabetic neuropathy, unspecified: Secondary | ICD-10-CM | POA: Diagnosis not present

## 2017-11-11 NOTE — Telephone Encounter (Signed)
Sent to PCP for the OK for verbal orders  

## 2017-11-11 NOTE — Telephone Encounter (Signed)
Copied from [place] [id number]. Topic: Quick Communication - See Telephone Encounter >> Nov 11, 2017  2:52 PM Rutherford Nail, NT wrote: CRM for notification. See Telephone encounter for: 11/11/17. Request additional skilled nursing:  1x a week x 4 weeks. 1x every other week x 4 weeks   CB#: [phone]

## 2017-11-12 DIAGNOSIS — D509 Iron deficiency anemia, unspecified: Secondary | ICD-10-CM | POA: Diagnosis not present

## 2017-11-12 DIAGNOSIS — Z8701 Personal history of pneumonia (recurrent): Secondary | ICD-10-CM | POA: Diagnosis not present

## 2017-11-12 DIAGNOSIS — M6281 Muscle weakness (generalized): Secondary | ICD-10-CM | POA: Diagnosis not present

## 2017-11-12 DIAGNOSIS — E1140 Type 2 diabetes mellitus with diabetic neuropathy, unspecified: Secondary | ICD-10-CM | POA: Diagnosis not present

## 2017-11-12 DIAGNOSIS — F0390 Unspecified dementia without behavioral disturbance: Secondary | ICD-10-CM | POA: Diagnosis not present

## 2017-11-12 DIAGNOSIS — I10 Essential (primary) hypertension: Secondary | ICD-10-CM | POA: Diagnosis not present

## 2017-11-12 NOTE — Telephone Encounter (Signed)
Please okay the orders  

## 2017-11-12 NOTE — Telephone Encounter (Signed)
Andrew Norton and gave the OK for the verbal orders requested.

## 2017-11-13 DIAGNOSIS — M6281 Muscle weakness (generalized): Secondary | ICD-10-CM | POA: Diagnosis not present

## 2017-11-13 DIAGNOSIS — I10 Essential (primary) hypertension: Secondary | ICD-10-CM | POA: Diagnosis not present

## 2017-11-13 DIAGNOSIS — E1140 Type 2 diabetes mellitus with diabetic neuropathy, unspecified: Secondary | ICD-10-CM | POA: Diagnosis not present

## 2017-11-13 DIAGNOSIS — F0390 Unspecified dementia without behavioral disturbance: Secondary | ICD-10-CM | POA: Diagnosis not present

## 2017-11-13 DIAGNOSIS — D509 Iron deficiency anemia, unspecified: Secondary | ICD-10-CM | POA: Diagnosis not present

## 2017-11-13 DIAGNOSIS — Z8701 Personal history of pneumonia (recurrent): Secondary | ICD-10-CM | POA: Diagnosis not present

## 2017-11-14 DIAGNOSIS — H35033 Hypertensive retinopathy, bilateral: Secondary | ICD-10-CM | POA: Diagnosis not present

## 2017-11-14 DIAGNOSIS — H353132 Nonexudative age-related macular degeneration, bilateral, intermediate dry stage: Secondary | ICD-10-CM | POA: Diagnosis not present

## 2017-11-14 DIAGNOSIS — Z8701 Personal history of pneumonia (recurrent): Secondary | ICD-10-CM | POA: Diagnosis not present

## 2017-11-14 DIAGNOSIS — H3562 Retinal hemorrhage, left eye: Secondary | ICD-10-CM | POA: Diagnosis not present

## 2017-11-14 DIAGNOSIS — D509 Iron deficiency anemia, unspecified: Secondary | ICD-10-CM | POA: Diagnosis not present

## 2017-11-14 DIAGNOSIS — F0390 Unspecified dementia without behavioral disturbance: Secondary | ICD-10-CM | POA: Diagnosis not present

## 2017-11-14 DIAGNOSIS — E1140 Type 2 diabetes mellitus with diabetic neuropathy, unspecified: Secondary | ICD-10-CM | POA: Diagnosis not present

## 2017-11-14 DIAGNOSIS — M6281 Muscle weakness (generalized): Secondary | ICD-10-CM | POA: Diagnosis not present

## 2017-11-14 DIAGNOSIS — I10 Essential (primary) hypertension: Secondary | ICD-10-CM | POA: Diagnosis not present

## 2017-11-14 DIAGNOSIS — H34832 Tributary (branch) retinal vein occlusion, left eye, with macular edema: Secondary | ICD-10-CM | POA: Diagnosis not present

## 2017-11-18 DIAGNOSIS — S0100XA Unspecified open wound of scalp, initial encounter: Secondary | ICD-10-CM | POA: Diagnosis not present

## 2017-11-20 ENCOUNTER — Ambulatory Visit: Payer: Medicare Other | Admitting: Family Medicine

## 2017-11-20 ENCOUNTER — Telehealth: Payer: Self-pay | Admitting: Family Medicine

## 2017-11-20 DIAGNOSIS — I10 Essential (primary) hypertension: Secondary | ICD-10-CM | POA: Diagnosis not present

## 2017-11-20 DIAGNOSIS — M6281 Muscle weakness (generalized): Secondary | ICD-10-CM | POA: Diagnosis not present

## 2017-11-20 DIAGNOSIS — D509 Iron deficiency anemia, unspecified: Secondary | ICD-10-CM | POA: Diagnosis not present

## 2017-11-20 DIAGNOSIS — E1140 Type 2 diabetes mellitus with diabetic neuropathy, unspecified: Secondary | ICD-10-CM | POA: Diagnosis not present

## 2017-11-20 DIAGNOSIS — Z8701 Personal history of pneumonia (recurrent): Secondary | ICD-10-CM | POA: Diagnosis not present

## 2017-11-20 DIAGNOSIS — F0390 Unspecified dementia without behavioral disturbance: Secondary | ICD-10-CM | POA: Diagnosis not present

## 2017-11-20 NOTE — Telephone Encounter (Signed)
Margaretha Sheffield, RN: change in status:  Cannot get up independently - SOB , R 22-24, BP 102/58, CBG 182, patient is not doing anything for himself- c/o tiredness and weakness. Cough started last night- this morning wet cough x1- yellow , thick Responding- but not like normal- drifting to sleep while sitting up- nurse reports she does not hear anything in lungs- clear. Call to office- they were notified and they want to make sure patient keeps his appointment- instructed RN in home to let family know that if he should show any signs of heart attack/stroke to call [phone]

## 2017-11-20 NOTE — Telephone Encounter (Signed)
Sent to PCP as an FYI advise pt to keep appt for tomorrow and go to the ER is symptoms get worse.

## 2017-11-21 ENCOUNTER — Telehealth: Payer: Self-pay | Admitting: Family Medicine

## 2017-11-21 ENCOUNTER — Ambulatory Visit (INDEPENDENT_AMBULATORY_CARE_PROVIDER_SITE_OTHER): Payer: Medicare Other | Admitting: Neurology

## 2017-11-21 ENCOUNTER — Encounter: Payer: Self-pay | Admitting: Family Medicine

## 2017-11-21 ENCOUNTER — Ambulatory Visit (INDEPENDENT_AMBULATORY_CARE_PROVIDER_SITE_OTHER): Payer: Medicare Other | Admitting: Family Medicine

## 2017-11-21 ENCOUNTER — Encounter: Payer: Self-pay | Admitting: Neurology

## 2017-11-21 ENCOUNTER — Encounter

## 2017-11-21 VITALS — BP 123/62 | HR 86 | Ht 69.0 in | Wt 149.0 lb

## 2017-11-21 VITALS — BP 120/64 | HR 82 | Temp 98.1°F | Ht 69.0 in | Wt 149.2 lb

## 2017-11-21 DIAGNOSIS — F0391 Unspecified dementia with behavioral disturbance: Secondary | ICD-10-CM | POA: Diagnosis not present

## 2017-11-21 DIAGNOSIS — R413 Other amnesia: Secondary | ICD-10-CM | POA: Diagnosis not present

## 2017-11-21 DIAGNOSIS — R4189 Other symptoms and signs involving cognitive functions and awareness: Secondary | ICD-10-CM | POA: Diagnosis not present

## 2017-11-21 DIAGNOSIS — R600 Localized edema: Secondary | ICD-10-CM | POA: Diagnosis not present

## 2017-11-21 DIAGNOSIS — I480 Paroxysmal atrial fibrillation: Secondary | ICD-10-CM

## 2017-11-21 DIAGNOSIS — I10 Essential (primary) hypertension: Secondary | ICD-10-CM

## 2017-11-21 DIAGNOSIS — E119 Type 2 diabetes mellitus without complications: Secondary | ICD-10-CM | POA: Diagnosis not present

## 2017-11-21 DIAGNOSIS — T148XXA Other injury of unspecified body region, initial encounter: Secondary | ICD-10-CM

## 2017-11-21 DIAGNOSIS — F411 Generalized anxiety disorder: Secondary | ICD-10-CM

## 2017-11-21 DIAGNOSIS — S0100XD Unspecified open wound of scalp, subsequent encounter: Secondary | ICD-10-CM | POA: Diagnosis not present

## 2017-11-21 LAB — POCT GLYCOSYLATED HEMOGLOBIN (HGB A1C): Hemoglobin A1C: 5.4

## 2017-11-21 MED ORDER — FERROUS SULFATE 220 (44 FE) MG/5ML PO ELIX: ORAL_SOLUTION | ORAL | 11 refills | Status: DC

## 2017-11-21 NOTE — Progress Notes (Signed)
   Subjective:    Patient ID: Andrew Norton, male    DOB: 12-15-1926, 84 y.o.   MRN: [id number]  HPI Here with his family for a number of issues. First his dementia has been fairly stable. The Risperidone has helped with the behavioral outbursts and confusion at night, but it was very sedating so they have cut the dose back to 2 tablets qhs. He is due to see Dr. Krista Blue soon for a Neurology evaluation. Second 3 weeks ago he had a large skin cancer removed from the scalp by Dr. Link Snuffer and the wound was left to heal in primarily. This was dressed with Xeroform but it needs changing. He has gained one lb over the past week despite a poor appetite and his feet have been swelling. No SOB or cough. The family has hired a Field seismologist to help with him 3 days a week, but they are also depending on home health nurses and aides. They are currently using [facility] but they are not happy with the service and would like to switch to another company.    Review of Systems  Constitutional: Negative.   Respiratory: Negative.   Cardiovascular: Positive for leg swelling. Negative for chest pain and palpitations.  Gastrointestinal: Negative.   Genitourinary: Negative.   Neurological: Positive for weakness. Negative for syncope, speech difficulty and headaches.  Psychiatric/Behavioral: Positive for agitation, behavioral problems and confusion.       Objective:   Physical Exam  Constitutional: He is oriented to person, place, and time.  In a wheelchair, alert  Neck: No thyromegaly present.  Cardiovascular: Normal rate, regular rhythm, normal heart sounds and intact distal pulses.  Pulmonary/Chest: Effort normal and breath sounds normal. No stridor. No respiratory distress. He has no wheezes. He has no rales.  Abdominal: Soft. Bowel sounds are normal. He exhibits no distension and no mass. There is no tenderness. There is no rebound and no guarding.  Musculoskeletal:  1+ edema in both feet   Lymphadenopathy:      He has no cervical adenopathy.  Neurological: He is alert and oriented to person, place, and time.  Skin:  The scalp vertex has a 3 cm wound where the skin has been excised down to the subcutaneous fat layer. It looks clean and some granulation tissue is coming in          Assessment & Plan:  He is healing from a skin surgery to remove a cancer, and we dressed the wound with Vaseline, Telfa, and gauze. The family will do the same at home daily. We will contact [place] to assist the family. He will see Dr. Krista Blue soon to address the dementia. He has mild pedal edema but there is no pulmonary edema so we agreed to not treat this with diuretics unless it gest worse. Check an A1c today.  Alysia Penna, MD

## 2017-11-21 NOTE — Progress Notes (Signed)
PATIENT: Andrew Norton DOB: [birth year]   HISTORY OF PRESENT ILLNESS:  HISTORY (Dr. Krista Blue): [place] is 84 years old right-handed Caucasian male, accompanied by his wife, primary care physician is Dr. Alysia Penna, he was a previous patient of Dr. Erling Cruz.  He had a past medical history of diabetes, shingles involving right C7 dermatome, mild memory loss, essential tremor  He had a history of acute on chronic cervical cord compression at decompression surgery October 2009, by neurosurgeon Dr. Vertell Limber, anterior cervical decompression, fusion at C3-4, C4-5, C5-6, with allograft, and anterior cervical plate, post surgically, he developed cardiac arrhythmia, myelopathy, with profound left arm, leg weakness, had slow recovery, Since the surgery, he continues to notice burning and numbness in both hands, left greater than right, mild gait difficulty,  He was treated with gabapentin 300 mg for his neuropathic pain involving left arm, and leg  He also suffered bladder urgency, occasionally bladder spasm, bowel incontinence, Post surgical MRI cervical in January 2011 showed ACDF at C3, 4, C4-5, C5-6, interbody anterior plate and screw, hypodensity in the cord at C5-6, that is cystic, compatible with chronic myelomalacia, at C6 and 7, there is progression of central disc protrusion, disc osteophyte complex on the left, moderate large extruded disc fragment at T1-2, extending cranially. He had a repeat cervical MRI September 2011, which showed similar findings, but also questionable cord edema vs. myelomalacia at C4-5, He also complained of severe right lower extremity shooting pain, fell, and broken his left hip, had open reduction internal fixation 2011 by Dr.Allusio, He had worsening low back pain, CT myelogram December 2012 showed mild multifactorial spinal stenosis at L1-2, bilateral foraminal stenosis, mild multifactorial spinal stenosis at L3, moderate disc bulging at L4-5, with bilateral subarticular  recess, and foraminal stenosis, right greater than left, compression deformity of L1 and 2. He eventually underwent lumbar decompression in April 2013 by Dr. Maxie Better, He complains of almost constant and mild to moderate left hand and left leg neuropathic pain, despite Neurontin treatment, mild worsening gait difficulty, and he is taking hydrocodone 5/325 mg twice a day, receiving epidural injection by Dr. Nelva Bush.   MRI cervical in 2015  together: bone fusion and metal hardware from C3-C6 levels. Spinal cord atrophy and gliosis at C6 level.  C6-7: disc bulging, uncovertebral joint hypertrophy, and facet hypertrophy with moderate spinal stenosis and severe biforaminal foraminal stenosis. C3-4, C4-5 and C5-6 there is severe biforaminal foraminal stenosis. T1-2: disc protrusion with superior migration of disc material with slight deformation of spinal cord. Compared to MRI on 10/26/10, there T1-2 disc protrusion has slightly increased. Otherwise no change.  UPDATE Feb 7th 2018: He was found to have nonsustained ventricular tachycardia, had pacemaker placement in November 2017, and then had ERCP for biliary duct stone removal, was treated with Cipro and Flagyl,  He now goes to rehab twice a week for gait training, he is now using walker, he still complains of pain in his fingers, arms and legs, f was able to review the note from Dr. Sarajane Jews on September 23, 2017, this is following his hospital stay for pneumonia, was treated with IV Rocephin and azithromycin, followed by [facility] inpatient rehabilitation, was noted he has bradycardia, pacemaker was activated, metoprolol dose was decreased to 12.5 mg twice a day, was seen by cardiologist following hospital discharge, [place] mental status evaluation scored 7 out of possible 30 points, he was found to have significant memory loss, rapid decline over the past few months, intermittent confusion, especially  following his pneumonia, need assistant in all daily  activity, decreased ability to cough, hallucinations,  He is now back home with his wife since March 2019, continue have significant evening time confusion, was started on Risperdal up to 3 mg every night, now decreased to 2 mg every night, getting up multiple times at nighttime, has some home help 3 nights a week, he continued to receive home physical therapy, ambulate with a walker, tendency to fall,  Reviewed CT head in September 2016, generalized atrophy, periventricular small vessel disease  Laboratory evaluation in 2019 showed normal B12 940, TSH, chronic anemia hemoglobin was 8.3 Lab b12 940, tsh, Hg 8.3, eet, low back, he is using lidoderm patches, but would not take away the pain. He is still taking gabapentin 400 mg 4 times a day, use Flexeril every night for bilateral lower extremity muscle cramping, occasionally urinary urgency incontinence, mild chronic constipations  UPDATE Nov 21 2017: He was admitted to the hospital pneumonia in February 2019,  I   REVIEW OF SYSTEMS: Out of a complete 14 system review of symptoms, the patient complains only of the following symptoms, and all other reviewed systems are negative. Fatigue,  ALLERGIES: PCN  HOME MEDICATIONS: Outpatient Medications Prior to Visit  Medication Sig Dispense Refill  . cyanocobalamin (,VITAMIN B-12,) 1000 MCG/ML injection Inject 1,000 mcg into the muscle every 30 (thirty) days.    . cyclobenzaprine (FLEXERIL) 10 MG tablet TAKE 1 TABLET 3 TIMES DAILYAS NEEDED FOR MUSCLE SPASMS 270 tablet 3  . ferrous sulfate 220 (44 Fe) MG/5ML solution TAKE 7 ML BY MOUTH DAILY 946 mL 11  . finasteride (PROSCAR) 5 MG tablet Take 1 tablet (5 mg total) by mouth daily. 90 tablet 1  . flecainide (TAMBOCOR) 50 MG tablet Take 1 tablet (50 mg total) by mouth every 12 (twelve) hours. 180 tablet 2  . glipiZIDE (GLUCOTROL) 10 MG tablet Take 0.5 tablets (5 mg total) by mouth 2 (two) times daily. 2 tablet 0  . Glucose Blood (TRUE METRIX BLOOD  GLUCOSE TEST VI) by In Vitro route 2 (two) times daily. Diagnosis code is E 11.9    . lidocaine (LIDODERM) 5 % Place 1 patch onto the skin daily. Remove & Discard patch within 12 hours or as directed by MD 90 patch 3  . metFORMIN (GLUCOPHAGE) 1000 MG tablet TAKE 1 TABLET TWICE A DAY  WITH MEALS 180 tablet 3  . metoprolol tartrate (LOPRESSOR) 25 MG tablet Take 0.5 tablets (12.5 mg total) by mouth 2 (two) times daily. 180 tablet 0  . NUTRITIONAL SUPPLEMENT LIQD Take 120 mLs by mouth 2 (two) times daily. SF MedPass for nutritional support    . polyethylene glycol powder (GLYCOLAX/MIRALAX) powder Take 17 g by mouth daily. (Patient taking differently: Take 17 g by mouth as needed. ) 3350 g 3  . risperiDONE (RISPERDAL) 1 MG tablet Take 3 tablets (3 mg total) by mouth at bedtime. 90 tablet 5  . rivaroxaban (XARELTO) 20 MG TABS tablet Take 1 tablet (20 mg total) by mouth daily with supper. 90 tablet 2  . sitaGLIPtin (JANUVIA) 100 MG tablet Take 1 tablet (100 mg total) by mouth daily. 90 tablet 3  . gabapentin (NEURONTIN) 400 MG capsule Take 1 capsule (400 mg total) by mouth 3 (three) times daily. 360 capsule 3   No facility-administered medications prior to visit.     PAST MEDICAL HISTORY: Past Medical History:  Diagnosis Date  . Anemia   . Arthritis    hips  .  Atrial fibrillation ([place])   . Carpal tunnel syndrome    left leg sciatica  . Diabetes mellitus   . Hearing loss    left ear greater loss  . History of shingles    past hx. left hand  . Hyperlipidemia   . Hypertension    EKG, chest x ray 11/15/10 EPIC,  clearance Dr Sharlene Motts on chart/ pt states on meds to "prevent hypertension from diabetes"  . Paget's disease    scrotum  . Paroxysmal atrial fibrillation ([place]) 05/18/2016  . Positive PPD, treated     PAST SURGICAL HISTORY: Past Surgical History:  Procedure Laterality Date  . CERVICAL FUSION     dr Vertell Limber 2009- retained hardware  . colonoscopy  2-08   per Dr. Oletta Lamas, normal   . EP  IMPLANTABLE DEVICE N/A 05/28/2016   Procedure: Pacemaker Implant;  Surgeon: Will Meredith Leeds, MD;  Location: [facility];  Service: Cardiovascular;  Laterality: N/A;  . ERCP N/A 05/22/2016   Procedure: ENDOSCOPIC RETROGRADE CHOLANGIOPANCREATOGRAPHY (ERCP);  Surgeon: Milus Banister, MD;  Location: [facility];  Service: Endoscopy;  Laterality: N/A;  . EYE SURGERY     bilateral cataract extraction with IOL  . HIP FRACTURE SURGERY  11-24-10   left hip per Dr, [name]  . LUMBAR LAMINECTOMY/DECOMPRESSION MICRODISCECTOMY  11/07/2011   Procedure: LUMBAR LAMINECTOMY/DECOMPRESSION MICRODISCECTOMY;  Surgeon: Johnn Hai, MD;  Location: [facility];  Service: Orthopedics;  Laterality: N/A;  Decompression of the L4 - L5 Central (X-ray)   . partial removal of scrotum    . TONSILLECTOMY    . TOTAL HIP REVISION Left 11/05/2012   Procedure: Conversion of a Bipolar to a Left Total Hip Arthroplasty;  Surgeon: [name], MD;  Location: [facility];  Service: Orthopedics;  Laterality: Left;  Conversion of a Bipolar to a Left Total Hip Arthroplasty    FAMILY HISTORY: Family History  Problem Relation Age of Onset  . Tuberculosis Mother   . Heart failure Father     SOCIAL HISTORY: Social History   Socioeconomic History  . Marital status: Married    Spouse name: [name]  . Number of children: 3  . Years of education: College-BS  . Highest education level: Not on file  Occupational History  . Occupation: retired    Fish farm manager: RETIRED  Social Needs  . Financial resource strain: Not on file  . Food insecurity:    Worry: Not on file    Inability: Not on file  . Transportation needs:    Medical: Not on file    Non-medical: Not on file  Tobacco Use  . Smoking status: Never Smoker  . Smokeless tobacco: Never Used  Substance and Sexual Activity  . Alcohol use: No    Alcohol/week: 0.0 oz  . Drug use: No  . Sexual activity: Never  Lifestyle  . Physical activity:    Days per week: Not  on file    Minutes per session: Not on file  . Stress: Not on file  Relationships  . Social connections:    Talks on phone: Not on file    Gets together: Not on file    Attends religious service: Not on file    Active member of club or organization: Not on file    Attends meetings of clubs or organizations: Not on file    Relationship status: Not on file  . Intimate partner violence:    Fear of current or ex partner: Not on file  Emotionally abused: Not on file    Physically abused: Not on file    Forced sexual activity: Not on file  Other Topics Concern  . Not on file  Social History Narrative   Pt lives at home with wife [name])   Pt is right handed   Education college-BS   Pt states that he drinks 1 cup of coffee daily, and occ may have a cup of tea or soda      PHYSICAL EXAM  Vitals:   11/21/17 1530  BP: 123/62  Pulse: 86  Weight: 149 lb (67.6 kg)  Height: 5\' 9"  (1.753 m)   Body mass index is 22 kg/m.  PHYSICAL EXAMNIATION:  Gen: NAD, conversant, well nourised, obese, well groomed                     Cardiovascular: Regular rate rhythm, no peripheral edema, warm, nontender. Eyes: Conjunctivae clear without exudates or hemorrhage Neck: Supple, no carotid bruits. Pulmonary: Clear to auscultation bilaterally   NEUROLOGICAL EXAM:  MMSE - Mini Mental State Exam 11/21/2017 11/29/2016  Orientation to time 4 3  Orientation to Place 4 5  Registration 3 3  Attention/ Calculation 5 5  Recall 2 2  Language- name 2 objects 2 2  Language- repeat 1 1  Language- follow 3 step command 3 3  Language- read & follow direction 1 1  Write a sentence 1 1  Copy design 1 1  Total score 27 27     CRANIAL NERVES: CN II: Visual fields are full to confrontation. Fundoscopic exam is normal with sharp discs and no vascular changes. Pupils are round equal and briskly reactive to light. CN III, IV, VI: extraocular movement are normal. No ptosis. CN V: Facial sensation is intact  to pinprick in all 3 divisions bilaterally. Corneal responses are intact.  CN VII: Left lower face weakness CN VIII: Hearing is normal to rubbing fingers CN IX, X: Palate elevates symmetrically. Phonation is normal. CN XI: Head turning and shoulder shrug are intact CN XII: Tongue is midline with normal movements and no atrophy.  MOTOR: Fixation of left upper extremity upon rapid rotating, drift of left leg, mild bilateral ankle dorsiflexion weakness  REFLEXES: Reflexes are 2+ and symmetric at the biceps, triceps, knees, and absent at ankles. Plantar responses are flexor.  SENSORY: Mildly length dependent decreased to light touch  COORDINATION: Rapid alternating movements and fine finger movements are intact. There is no dysmetria on finger-to-nose and heel-knee-shin.    GAIT/STANCE:  Needs assistance to get up from seated position, spastic, wide-based, cautious, rely on his walker   DIAGNOSTIC DATA (LABS, IMAGING, TESTING) - I reviewed patient records, labs, notes, testing and imaging myself where available.  Lab Results  Component Value Date   WBC 6.9 08/28/2017   HGB 8.3 (L) 08/28/2017   HCT 23.6 (L) 08/28/2017   MCV 88.7 08/28/2017   PLT 330 08/28/2017      Component Value Date/Time   NA 126 (L) 08/28/2017 0552   NA 139 06/05/2016   K 4.7 08/28/2017 0552   CL 92 (L) 08/28/2017 0552   CO2 20 (L) 08/28/2017 0552   GLUCOSE 223 (H) 08/28/2017 0552   BUN 15 08/28/2017 0552   BUN 23 (A) 06/05/2016   CREATININE 0.68 08/28/2017 0552   CALCIUM 8.4 (L) 08/28/2017 0552   PROT 6.4 (L) 08/28/2017 0552   ALBUMIN 2.5 (L) 08/28/2017 0552   AST 25 08/28/2017 0552   ALT 28 08/28/2017 0552  ALKPHOS 136 (H) 08/28/2017 0552   BILITOT 0.3 08/28/2017 0552   GFRNONAA >60 08/28/2017 0552   GFRAA >60 08/28/2017 0552   Lab Results  Component Value Date   CHOL 128 11/29/2016   HDL 47.00 11/29/2016   LDLCALC 56 11/29/2016   TRIG 126.0 11/29/2016   CHOLHDL 3 11/29/2016   Lab  Results  Component Value Date   HGBA1C 5.4 11/21/2017    Lab Results  Component Value Date   TSH 2.66 08/21/2017      ASSESSMENT AND PLAN 84 y.o. year old Gait abnormality  Multifactorial, history of cervical spondylitic myelopathy, status post decompression, continued evidence of cervical cystic myelomalacia, also had a history of lumbar stenosis, status post decompression, evidence of bilateral distal left weakness, hospital admission for pneumonia in February 2019, deconditioning, worsening memory loss,  Cognitive impairment  Gradual onset over the past few years, acute worsening since his pneumonia in February 2019   CT head in 2016 showed generalized atrophy, supratentorium small vessel disease, recent onset of left lower face weakness, left arm and leg weakness, repeat CT head without contrast to rule out structural lesion, not a candidate for MRI due to pacemaker  Laboratory evaluation showed normal TSH, B12, no treatable etiology identified,  I spent lengthy time discussed with patient, his family about the potential long-term prognosis, most likely with his age, comorbidity, he will continue to experience gradual worsening memory loss, gait abnormality, family will initiate long-term care insurance soon.  Chronic iron deficiency anemia  Baseline hemoglobin of 8.7  Iron supplement  Marcial Pacas, M.D. Ph.D.  [facility] [place], [place] [address] Phone: [phone] Fax:      [phone]

## 2017-11-21 NOTE — Telephone Encounter (Signed)
We will see him today

## 2017-11-21 NOTE — Telephone Encounter (Signed)
Copied from [place] [id number]. Topic: Quick Communication - See Telephone Encounter >> Nov 21, 2017  5:03 PM [place], Laban Emperor wrote: CRM for notification. See Telephone encounter for: 11/21/17.  Margaretha Sheffield calling from [place] to request skilled nursing for 1 week 4 and every other week for a month. Also for a home health aid to help pt with his bath.  Ph# [phone]

## 2017-11-22 ENCOUNTER — Telehealth: Payer: Self-pay | Admitting: Neurology

## 2017-11-22 DIAGNOSIS — E1140 Type 2 diabetes mellitus with diabetic neuropathy, unspecified: Secondary | ICD-10-CM | POA: Diagnosis not present

## 2017-11-22 DIAGNOSIS — S0100XD Unspecified open wound of scalp, subsequent encounter: Secondary | ICD-10-CM | POA: Diagnosis not present

## 2017-11-22 DIAGNOSIS — Z950 Presence of cardiac pacemaker: Secondary | ICD-10-CM | POA: Diagnosis not present

## 2017-11-22 DIAGNOSIS — I10 Essential (primary) hypertension: Secondary | ICD-10-CM | POA: Diagnosis not present

## 2017-11-22 DIAGNOSIS — R1310 Dysphagia, unspecified: Secondary | ICD-10-CM | POA: Diagnosis not present

## 2017-11-22 DIAGNOSIS — Z7982 Long term (current) use of aspirin: Secondary | ICD-10-CM | POA: Diagnosis not present

## 2017-11-22 DIAGNOSIS — E785 Hyperlipidemia, unspecified: Secondary | ICD-10-CM | POA: Diagnosis not present

## 2017-11-22 DIAGNOSIS — Z7984 Long term (current) use of oral hypoglycemic drugs: Secondary | ICD-10-CM | POA: Diagnosis not present

## 2017-11-22 DIAGNOSIS — Z8701 Personal history of pneumonia (recurrent): Secondary | ICD-10-CM | POA: Diagnosis not present

## 2017-11-22 DIAGNOSIS — Z7901 Long term (current) use of anticoagulants: Secondary | ICD-10-CM | POA: Diagnosis not present

## 2017-11-22 DIAGNOSIS — F0390 Unspecified dementia without behavioral disturbance: Secondary | ICD-10-CM | POA: Diagnosis not present

## 2017-11-22 DIAGNOSIS — D509 Iron deficiency anemia, unspecified: Secondary | ICD-10-CM | POA: Diagnosis not present

## 2017-11-22 DIAGNOSIS — I482 Chronic atrial fibrillation: Secondary | ICD-10-CM | POA: Diagnosis not present

## 2017-11-22 NOTE — Telephone Encounter (Signed)
Sent to PCP for approval.  

## 2017-11-22 NOTE — Addendum Note (Signed)
Addended by: Dorrene German on: 11/22/2017 09:17 AM   Modules accepted: Orders

## 2017-11-22 NOTE — Telephone Encounter (Signed)
I will NOT okay these orders because the family requested to change [facility] providers to [facility]. They were contacted this morning.

## 2017-11-22 NOTE — Telephone Encounter (Signed)
medicare/aenta order sent to [facility]. They will reach out to the pt to schedule.

## 2017-11-22 NOTE — Telephone Encounter (Signed)
Andrew Norton with Andrew Norton to advise that we are declining orders requested.   Sent to Andrew Norton Andrew Norton stated that this was Discuss Friday morning to switch to Andrew Norton was this done?

## 2017-11-23 DIAGNOSIS — S0100XD Unspecified open wound of scalp, subsequent encounter: Secondary | ICD-10-CM | POA: Diagnosis not present

## 2017-11-23 DIAGNOSIS — F0390 Unspecified dementia without behavioral disturbance: Secondary | ICD-10-CM | POA: Diagnosis not present

## 2017-11-23 DIAGNOSIS — R1310 Dysphagia, unspecified: Secondary | ICD-10-CM | POA: Diagnosis not present

## 2017-11-23 DIAGNOSIS — D509 Iron deficiency anemia, unspecified: Secondary | ICD-10-CM | POA: Diagnosis not present

## 2017-11-23 DIAGNOSIS — E1140 Type 2 diabetes mellitus with diabetic neuropathy, unspecified: Secondary | ICD-10-CM | POA: Diagnosis not present

## 2017-11-23 DIAGNOSIS — I10 Essential (primary) hypertension: Secondary | ICD-10-CM | POA: Diagnosis not present

## 2017-11-25 ENCOUNTER — Telehealth: Payer: Self-pay | Admitting: Family Medicine

## 2017-11-25 ENCOUNTER — Ambulatory Visit
Admission: RE | Admit: 2017-11-25 | Discharge: 2017-11-25 | Disposition: A | Discharge status: Home / Self Care | Payer: Medicare Other | Source: Ambulatory Visit | Attending: Neurology | Admitting: Neurology

## 2017-11-25 ENCOUNTER — Telehealth: Payer: Self-pay | Admitting: Neurology

## 2017-11-25 DIAGNOSIS — R413 Other amnesia: Secondary | ICD-10-CM

## 2017-11-25 NOTE — Telephone Encounter (Signed)
Yes, see referral in Epic for [facility].

## 2017-11-25 NOTE — Telephone Encounter (Signed)
Patient son Dimitrius Steedman called and said that he needs the referral cancel to [facility] and start back up with [facility]. He said that he did not want to cancel [facility] just was looking for more help for patient. If there are any questions to please call him back [phone].

## 2017-11-25 NOTE — Telephone Encounter (Signed)
fyi

## 2017-11-25 NOTE — Telephone Encounter (Signed)
Please call patient, CAT scan of the brain showed severe generalized atrophy, slight progression compared to previous scan in 2016, supratentorium small vessel disease,  There was no acute abnormality, I will review CAT scan with him next follow-up visit   IMPRESSION: This CT scan of the head without contrast shows the following: 1.   Severe generalized cortical atrophy that is most pronounced in the posterior frontal, parietal and mesial occipital lobes.   The extent of atrophy is only slightly progressed when compared to the 2016 CT scan. 2.   Mild chronic microvascular ischemic changes. 3.   There are no acute findings.

## 2017-11-25 NOTE — Telephone Encounter (Signed)
Copied from [place] [phone]. Topic: Inquiry >> Nov 25, 2017 10:31 AM Scherrie Gerlach wrote: Reason for CRM: candice with [facility] calling for verbal to get the home health aid reinstated. 2  wk / 3 Also like verbal for OT to continue to see the pt. Would like to re evaluate PT.  They will send a different PT person to get another perspective on the pt  Any nurse can take the order, but you are welcome to ask for [facility]

## 2017-11-25 NOTE — Telephone Encounter (Signed)
verbal order given per Dr.Fry

## 2017-11-26 DIAGNOSIS — R1310 Dysphagia, unspecified: Secondary | ICD-10-CM | POA: Diagnosis not present

## 2017-11-26 DIAGNOSIS — F0390 Unspecified dementia without behavioral disturbance: Secondary | ICD-10-CM | POA: Diagnosis not present

## 2017-11-26 DIAGNOSIS — S0100XD Unspecified open wound of scalp, subsequent encounter: Secondary | ICD-10-CM | POA: Diagnosis not present

## 2017-11-26 DIAGNOSIS — D509 Iron deficiency anemia, unspecified: Secondary | ICD-10-CM | POA: Diagnosis not present

## 2017-11-26 DIAGNOSIS — I10 Essential (primary) hypertension: Secondary | ICD-10-CM | POA: Diagnosis not present

## 2017-11-26 DIAGNOSIS — E1140 Type 2 diabetes mellitus with diabetic neuropathy, unspecified: Secondary | ICD-10-CM | POA: Diagnosis not present

## 2017-11-26 NOTE — Telephone Encounter (Signed)
Spoke with son Nicki Reaper and reviewed below MRI report.  He verbalized understanding of same/fim

## 2017-11-27 ENCOUNTER — Telehealth: Payer: Self-pay | Admitting: Family Medicine

## 2017-11-27 DIAGNOSIS — I10 Essential (primary) hypertension: Secondary | ICD-10-CM | POA: Diagnosis not present

## 2017-11-27 DIAGNOSIS — R1310 Dysphagia, unspecified: Secondary | ICD-10-CM | POA: Diagnosis not present

## 2017-11-27 DIAGNOSIS — E1140 Type 2 diabetes mellitus with diabetic neuropathy, unspecified: Secondary | ICD-10-CM | POA: Diagnosis not present

## 2017-11-27 DIAGNOSIS — F0390 Unspecified dementia without behavioral disturbance: Secondary | ICD-10-CM | POA: Diagnosis not present

## 2017-11-27 DIAGNOSIS — S0100XD Unspecified open wound of scalp, subsequent encounter: Secondary | ICD-10-CM | POA: Diagnosis not present

## 2017-11-27 DIAGNOSIS — D509 Iron deficiency anemia, unspecified: Secondary | ICD-10-CM | POA: Diagnosis not present

## 2017-11-27 NOTE — Telephone Encounter (Signed)
Contacted Andrew Norton with [place] [facility] and verbal okay per request.   Clair Gulling also wanted to communicate the following.  +2 ankle edema - bilateral

## 2017-11-27 NOTE — Telephone Encounter (Signed)
Noted  

## 2017-11-27 NOTE — Telephone Encounter (Signed)
Copied from [place] [id number]. Topic: Quick Communication - See Telephone Encounter >> Nov 27, 2017  3:01 PM Hewitt Shorts wrote: Laureen Ochs from [facility] is calling to give blood pressure readins of setting 130/80 standing 110/70 and pt is experiencing episodes of dizziness and they would like a verbal order For physical therapy 2 times for four weeks   Best number for JIm is [phone]

## 2017-11-28 ENCOUNTER — Telehealth: Payer: Self-pay | Admitting: Family Medicine

## 2017-11-28 DIAGNOSIS — R1310 Dysphagia, unspecified: Secondary | ICD-10-CM | POA: Diagnosis not present

## 2017-11-28 DIAGNOSIS — I10 Essential (primary) hypertension: Secondary | ICD-10-CM | POA: Diagnosis not present

## 2017-11-28 DIAGNOSIS — F0390 Unspecified dementia without behavioral disturbance: Secondary | ICD-10-CM | POA: Diagnosis not present

## 2017-11-28 DIAGNOSIS — D509 Iron deficiency anemia, unspecified: Secondary | ICD-10-CM | POA: Diagnosis not present

## 2017-11-28 DIAGNOSIS — S0100XD Unspecified open wound of scalp, subsequent encounter: Secondary | ICD-10-CM | POA: Diagnosis not present

## 2017-11-28 DIAGNOSIS — E1140 Type 2 diabetes mellitus with diabetic neuropathy, unspecified: Secondary | ICD-10-CM | POA: Diagnosis not present

## 2017-11-28 NOTE — Telephone Encounter (Signed)
Pt would like to d/c all orders from [facility] and have all orders go to [facility].  Order to d/c services will need to be sent to [facility] and new orders sent to [facility].

## 2017-11-29 DIAGNOSIS — D509 Iron deficiency anemia, unspecified: Secondary | ICD-10-CM | POA: Diagnosis not present

## 2017-11-29 DIAGNOSIS — S0100XD Unspecified open wound of scalp, subsequent encounter: Secondary | ICD-10-CM | POA: Diagnosis not present

## 2017-11-29 DIAGNOSIS — I10 Essential (primary) hypertension: Secondary | ICD-10-CM | POA: Diagnosis not present

## 2017-11-29 DIAGNOSIS — E1140 Type 2 diabetes mellitus with diabetic neuropathy, unspecified: Secondary | ICD-10-CM | POA: Diagnosis not present

## 2017-11-29 DIAGNOSIS — R1310 Dysphagia, unspecified: Secondary | ICD-10-CM | POA: Diagnosis not present

## 2017-11-29 DIAGNOSIS — F0390 Unspecified dementia without behavioral disturbance: Secondary | ICD-10-CM | POA: Diagnosis not present

## 2017-11-29 NOTE — Telephone Encounter (Signed)
I am confused. The family made it clear to me at his last OV that they were not happy with the services provided by [facility] and that they wanted to switch. I guess they changed their minds. I'm okay either way. Go ahead and switch him back to [facility], please

## 2017-11-29 NOTE — Telephone Encounter (Signed)
Please advise? We just switched this patient from [facility] to [place].

## 2017-12-02 ENCOUNTER — Telehealth: Payer: Self-pay | Admitting: Family Medicine

## 2017-12-02 ENCOUNTER — Ambulatory Visit (INDEPENDENT_AMBULATORY_CARE_PROVIDER_SITE_OTHER): Payer: Medicare Other | Admitting: *Deleted

## 2017-12-02 ENCOUNTER — Telehealth: Payer: Self-pay | Admitting: Cardiology

## 2017-12-02 DIAGNOSIS — E1140 Type 2 diabetes mellitus with diabetic neuropathy, unspecified: Secondary | ICD-10-CM | POA: Diagnosis not present

## 2017-12-02 DIAGNOSIS — I495 Sick sinus syndrome: Secondary | ICD-10-CM | POA: Diagnosis not present

## 2017-12-02 DIAGNOSIS — S0100XD Unspecified open wound of scalp, subsequent encounter: Secondary | ICD-10-CM | POA: Diagnosis not present

## 2017-12-02 DIAGNOSIS — R1310 Dysphagia, unspecified: Secondary | ICD-10-CM | POA: Diagnosis not present

## 2017-12-02 DIAGNOSIS — F0390 Unspecified dementia without behavioral disturbance: Secondary | ICD-10-CM | POA: Diagnosis not present

## 2017-12-02 DIAGNOSIS — I10 Essential (primary) hypertension: Secondary | ICD-10-CM | POA: Diagnosis not present

## 2017-12-02 DIAGNOSIS — D509 Iron deficiency anemia, unspecified: Secondary | ICD-10-CM | POA: Diagnosis not present

## 2017-12-02 NOTE — Telephone Encounter (Signed)
Confirmed remote transmission w/ pt wife.   

## 2017-12-02 NOTE — Telephone Encounter (Signed)
Confirmed that services with [facility] have been canceled and confirmed that patient is still active with [facility]. No further needs at this time. Per 11/21/17 phone note, son had called to cancel referral to [facility] at that time.

## 2017-12-02 NOTE — Telephone Encounter (Signed)
Please okay these orders  ?

## 2017-12-02 NOTE — Telephone Encounter (Signed)
Okay for verbal orders. 

## 2017-12-02 NOTE — Telephone Encounter (Signed)
Copied from [place] [phone]. Topic: Quick Communication - See Telephone Encounter >> Nov 27, 2017  3:01 PM Hewitt Shorts wrote: Laureen Ochs from [facility] is calling to give blood pressure readins of setting 130/80 standing 110/70 and pt is experiencing episodes of dizziness and they would like a verbal order For physical therapy 2 times for four weeks   Best number for JIm is [phone]   >> Dec 02, 2017  2:04 PM Oneta Rack wrote: Osvaldo Human name: Lyndel Pleasure  Relation to pt: OT from [place]  Call back number: [phone]    Reason for call:   Requesting verbal orders for speech therapy due to frequent coughing after eating, please advise

## 2017-12-03 ENCOUNTER — Other Ambulatory Visit: Payer: Self-pay | Admitting: Family Medicine

## 2017-12-03 ENCOUNTER — Encounter: Payer: Self-pay | Admitting: Cardiology

## 2017-12-03 ENCOUNTER — Telehealth: Payer: Self-pay | Admitting: Family Medicine

## 2017-12-03 DIAGNOSIS — F0390 Unspecified dementia without behavioral disturbance: Secondary | ICD-10-CM | POA: Diagnosis not present

## 2017-12-03 DIAGNOSIS — D509 Iron deficiency anemia, unspecified: Secondary | ICD-10-CM | POA: Diagnosis not present

## 2017-12-03 DIAGNOSIS — E1140 Type 2 diabetes mellitus with diabetic neuropathy, unspecified: Secondary | ICD-10-CM | POA: Diagnosis not present

## 2017-12-03 DIAGNOSIS — S0100XD Unspecified open wound of scalp, subsequent encounter: Secondary | ICD-10-CM | POA: Diagnosis not present

## 2017-12-03 DIAGNOSIS — R1310 Dysphagia, unspecified: Secondary | ICD-10-CM | POA: Diagnosis not present

## 2017-12-03 DIAGNOSIS — I10 Essential (primary) hypertension: Secondary | ICD-10-CM | POA: Diagnosis not present

## 2017-12-03 LAB — CUP PACEART REMOTE DEVICE CHECK
Battery Remaining Longevity: 108 mo
Battery Voltage: 3.02 V
Brady Statistic AS VS Percent: 94.9 %
Brady Statistic RA Percent Paced: 5.09 %
Brady Statistic RV Percent Paced: 0.01 %
Date Time Interrogation Session: 20190520190925
Implantable Lead Implant Date: 20171113
Implantable Lead Implant Date: 20171113
Implantable Lead Location: 753859
Implantable Lead Location: 753860
Implantable Lead Model: 5076
Implantable Pulse Generator Implant Date: 20171113
Lead Channel Impedance Value: 437 Ohm
Lead Channel Pacing Threshold Amplitude: 2.5 V
Lead Channel Pacing Threshold Pulse Width: 0.4 ms
Lead Channel Pacing Threshold Pulse Width: 0.4 ms
Lead Channel Sensing Intrinsic Amplitude: 1.125 mV
Lead Channel Sensing Intrinsic Amplitude: 1.125 mV
Lead Channel Setting Pacing Amplitude: 2.5 V
Lead Channel Setting Pacing Amplitude: 4 V
Lead Channel Setting Pacing Pulse Width: 0.8 ms
Lead Channel Setting Sensing Sensitivity: 1.2 mV
MDC IDC MSMT LEADCHNL RA IMPEDANCE VALUE: 342 Ohm
MDC IDC MSMT LEADCHNL RA IMPEDANCE VALUE: 437 Ohm
MDC IDC MSMT LEADCHNL RA PACING THRESHOLD AMPLITUDE: 1.5 V
MDC IDC MSMT LEADCHNL RV IMPEDANCE VALUE: 361 Ohm
MDC IDC MSMT LEADCHNL RV SENSING INTR AMPL: 5.25 mV
MDC IDC MSMT LEADCHNL RV SENSING INTR AMPL: 5.25 mV
MDC IDC STAT BRADY AP VP PERCENT: 0.01 %
MDC IDC STAT BRADY AP VS PERCENT: 5.09 %
MDC IDC STAT BRADY AS VP PERCENT: 0 %

## 2017-12-03 NOTE — Telephone Encounter (Signed)
Continue to observe him. If he continues to cough up sputum for 2 more days, have him come in to see [place]

## 2017-12-03 NOTE — Progress Notes (Signed)
Remote pacemaker transmission.   

## 2017-12-03 NOTE — Telephone Encounter (Signed)
Verbal orders called to [facility]y Lower as directed.

## 2017-12-03 NOTE — Telephone Encounter (Signed)
Sent to PCP to advise 

## 2017-12-03 NOTE — Telephone Encounter (Signed)
Copied from [place] [phone]. Topic: Quick Communication - See Telephone Encounter >> Dec 03, 2017  4:44 PM Robina Ade, Helene Kelp D wrote: CRM for notification. See Telephone encounter for: 12/03/17. Clair Gulling PT with [facility] called and would like to let Dr. Sarajane Jews know that patient is having increase yellow sputum the last few days not observed in session and vital are in normal limit and lugs sound normal. His call back number is [phone] if there are any questions for him.

## 2017-12-04 DIAGNOSIS — D509 Iron deficiency anemia, unspecified: Secondary | ICD-10-CM | POA: Diagnosis not present

## 2017-12-04 DIAGNOSIS — E1140 Type 2 diabetes mellitus with diabetic neuropathy, unspecified: Secondary | ICD-10-CM | POA: Diagnosis not present

## 2017-12-04 DIAGNOSIS — S0100XD Unspecified open wound of scalp, subsequent encounter: Secondary | ICD-10-CM | POA: Diagnosis not present

## 2017-12-04 DIAGNOSIS — R1310 Dysphagia, unspecified: Secondary | ICD-10-CM | POA: Diagnosis not present

## 2017-12-04 DIAGNOSIS — F0390 Unspecified dementia without behavioral disturbance: Secondary | ICD-10-CM | POA: Diagnosis not present

## 2017-12-04 DIAGNOSIS — I10 Essential (primary) hypertension: Secondary | ICD-10-CM | POA: Diagnosis not present

## 2017-12-04 NOTE — Telephone Encounter (Signed)
Called Jim and left a detailed VM left pt's name and DOB. Advised him to follow up with an appt. with Dr. Sarajane Jews if pt's continues his symptoms for the next two days. Left our call back number as well.

## 2017-12-05 ENCOUNTER — Telehealth: Payer: Self-pay | Admitting: Family Medicine

## 2017-12-05 DIAGNOSIS — I10 Essential (primary) hypertension: Secondary | ICD-10-CM | POA: Diagnosis not present

## 2017-12-05 DIAGNOSIS — S0100XD Unspecified open wound of scalp, subsequent encounter: Secondary | ICD-10-CM | POA: Diagnosis not present

## 2017-12-05 DIAGNOSIS — F0390 Unspecified dementia without behavioral disturbance: Secondary | ICD-10-CM | POA: Diagnosis not present

## 2017-12-05 DIAGNOSIS — R1310 Dysphagia, unspecified: Secondary | ICD-10-CM

## 2017-12-05 DIAGNOSIS — D509 Iron deficiency anemia, unspecified: Secondary | ICD-10-CM | POA: Diagnosis not present

## 2017-12-05 DIAGNOSIS — E1140 Type 2 diabetes mellitus with diabetic neuropathy, unspecified: Secondary | ICD-10-CM | POA: Diagnosis not present

## 2017-12-05 NOTE — Telephone Encounter (Signed)
Copied from [place] [phone]. Topic: Quick Communication - See Telephone Encounter >> Dec 05, 2017  2:54 PM Bea Graff, NT wrote: CRM for notification. See Telephone encounter for: 12/05/17. Clair Gulling a physical therapist with [facility] calling to report pts vitals and BP. Seated Bp 110/70, standing: 80/60. Pt frequently has drops in bp when standing with intermittent dizziness. CB#: [phone]

## 2017-12-05 NOTE — Telephone Encounter (Signed)
Copied from [place] [phone]. Topic: Quick Communication - See Telephone Encounter >> Dec 05, 2017  2:06 PM [place], Laban Emperor wrote: CRM for notification. See Telephone encounter for: 12/05/17.  Ebony Hail calling from [facility] to request verbal orders for speech thpy. For 1 week 2, and modified barium study.   [phone]

## 2017-12-06 NOTE — Telephone Encounter (Signed)
The swallowing study was ordered

## 2017-12-06 NOTE — Telephone Encounter (Signed)
Noted  

## 2017-12-06 NOTE — Telephone Encounter (Signed)
Called Andrew Norton and left a VM with pt's name and DOB informing her that the orders are in Epic now for requested test.

## 2017-12-06 NOTE — Telephone Encounter (Signed)
Please okay these orders  ?

## 2017-12-06 NOTE — Telephone Encounter (Signed)
Called and spoke with Ebony Hail and gave her the OK for verbal orders.   Ebony Hail stated that she will need Dr. Sarajane Jews to put the orders in epic for the modified barium study. These orders must be in epic in order for her to schedule pt.  Sent to PCP   Call Ebony Hail back once this has been sone.

## 2017-12-06 NOTE — Telephone Encounter (Signed)
Sent to PCP for the OK for verbal orders  

## 2017-12-06 NOTE — Telephone Encounter (Signed)
Sent to PCP as an [place]. Advise if further instructions are needed.

## 2017-12-09 DIAGNOSIS — I10 Essential (primary) hypertension: Secondary | ICD-10-CM | POA: Diagnosis not present

## 2017-12-09 DIAGNOSIS — D509 Iron deficiency anemia, unspecified: Secondary | ICD-10-CM | POA: Diagnosis not present

## 2017-12-09 DIAGNOSIS — E1140 Type 2 diabetes mellitus with diabetic neuropathy, unspecified: Secondary | ICD-10-CM | POA: Diagnosis not present

## 2017-12-09 DIAGNOSIS — F0390 Unspecified dementia without behavioral disturbance: Secondary | ICD-10-CM | POA: Diagnosis not present

## 2017-12-09 DIAGNOSIS — R1310 Dysphagia, unspecified: Secondary | ICD-10-CM | POA: Diagnosis not present

## 2017-12-09 DIAGNOSIS — S0100XD Unspecified open wound of scalp, subsequent encounter: Secondary | ICD-10-CM | POA: Diagnosis not present

## 2017-12-10 ENCOUNTER — Ambulatory Visit (INDEPENDENT_AMBULATORY_CARE_PROVIDER_SITE_OTHER): Payer: Medicare Other | Admitting: Family Medicine

## 2017-12-10 ENCOUNTER — Other Ambulatory Visit (HOSPITAL_COMMUNITY): Payer: Self-pay | Admitting: Family Medicine

## 2017-12-10 DIAGNOSIS — F0390 Unspecified dementia without behavioral disturbance: Secondary | ICD-10-CM | POA: Diagnosis not present

## 2017-12-10 DIAGNOSIS — I10 Essential (primary) hypertension: Secondary | ICD-10-CM | POA: Diagnosis not present

## 2017-12-10 DIAGNOSIS — E538 Deficiency of other specified B group vitamins: Secondary | ICD-10-CM | POA: Diagnosis not present

## 2017-12-10 DIAGNOSIS — E1140 Type 2 diabetes mellitus with diabetic neuropathy, unspecified: Secondary | ICD-10-CM | POA: Diagnosis not present

## 2017-12-10 DIAGNOSIS — R1310 Dysphagia, unspecified: Secondary | ICD-10-CM

## 2017-12-10 DIAGNOSIS — Z4801 Encounter for change or removal of surgical wound dressing: Secondary | ICD-10-CM | POA: Diagnosis not present

## 2017-12-10 DIAGNOSIS — S0100XA Unspecified open wound of scalp, initial encounter: Secondary | ICD-10-CM | POA: Diagnosis not present

## 2017-12-10 DIAGNOSIS — D509 Iron deficiency anemia, unspecified: Secondary | ICD-10-CM | POA: Diagnosis not present

## 2017-12-10 DIAGNOSIS — S0100XD Unspecified open wound of scalp, subsequent encounter: Secondary | ICD-10-CM | POA: Diagnosis not present

## 2017-12-10 MED ORDER — CYANOCOBALAMIN 1000 MCG/ML IJ SOLN
1000.0000 ug | Freq: Once | INTRAMUSCULAR | Status: AC
  Administered 2017-12-10: 1000 ug via INTRAMUSCULAR

## 2017-12-10 NOTE — Progress Notes (Signed)
Per orders of Dr. Fry, injection of Vitamin B 12 given by NIMMONS, SYLVIA ANN. Patient tolerated injection well. 

## 2017-12-11 DIAGNOSIS — F0390 Unspecified dementia without behavioral disturbance: Secondary | ICD-10-CM | POA: Diagnosis not present

## 2017-12-11 DIAGNOSIS — I10 Essential (primary) hypertension: Secondary | ICD-10-CM | POA: Diagnosis not present

## 2017-12-11 DIAGNOSIS — D509 Iron deficiency anemia, unspecified: Secondary | ICD-10-CM | POA: Diagnosis not present

## 2017-12-11 DIAGNOSIS — R1310 Dysphagia, unspecified: Secondary | ICD-10-CM | POA: Diagnosis not present

## 2017-12-11 DIAGNOSIS — E1140 Type 2 diabetes mellitus with diabetic neuropathy, unspecified: Secondary | ICD-10-CM | POA: Diagnosis not present

## 2017-12-11 DIAGNOSIS — S0100XD Unspecified open wound of scalp, subsequent encounter: Secondary | ICD-10-CM | POA: Diagnosis not present

## 2017-12-12 ENCOUNTER — Telehealth: Payer: Self-pay | Admitting: Family Medicine

## 2017-12-12 DIAGNOSIS — E1140 Type 2 diabetes mellitus with diabetic neuropathy, unspecified: Secondary | ICD-10-CM | POA: Diagnosis not present

## 2017-12-12 DIAGNOSIS — R1310 Dysphagia, unspecified: Secondary | ICD-10-CM | POA: Diagnosis not present

## 2017-12-12 DIAGNOSIS — S0100XD Unspecified open wound of scalp, subsequent encounter: Secondary | ICD-10-CM | POA: Diagnosis not present

## 2017-12-12 DIAGNOSIS — D509 Iron deficiency anemia, unspecified: Secondary | ICD-10-CM | POA: Diagnosis not present

## 2017-12-12 DIAGNOSIS — F0390 Unspecified dementia without behavioral disturbance: Secondary | ICD-10-CM | POA: Diagnosis not present

## 2017-12-12 DIAGNOSIS — I10 Essential (primary) hypertension: Secondary | ICD-10-CM | POA: Diagnosis not present

## 2017-12-12 NOTE — Telephone Encounter (Signed)
Copied from [place]. Topic: General - Other >> Dec 12, 2017 12:57 PM Margot Ables wrote: Reason for CRM:  1. Seated BP 120/70 Standing BP 90/50 w/mild dizziness - several visits have resulted with similar #s 2. Pt reports past medical history with restless leg - it has flared up w/in past 1-2 weeks - pt is taking flexeril or tylenol w/fair effectiveness

## 2017-12-13 NOTE — Telephone Encounter (Signed)
Sent to PCP as an FYI  

## 2017-12-16 DIAGNOSIS — S0100XD Unspecified open wound of scalp, subsequent encounter: Secondary | ICD-10-CM | POA: Diagnosis not present

## 2017-12-16 DIAGNOSIS — E1140 Type 2 diabetes mellitus with diabetic neuropathy, unspecified: Secondary | ICD-10-CM | POA: Diagnosis not present

## 2017-12-16 DIAGNOSIS — F0390 Unspecified dementia without behavioral disturbance: Secondary | ICD-10-CM | POA: Diagnosis not present

## 2017-12-16 DIAGNOSIS — I10 Essential (primary) hypertension: Secondary | ICD-10-CM | POA: Diagnosis not present

## 2017-12-16 DIAGNOSIS — R1310 Dysphagia, unspecified: Secondary | ICD-10-CM | POA: Diagnosis not present

## 2017-12-16 DIAGNOSIS — D509 Iron deficiency anemia, unspecified: Secondary | ICD-10-CM | POA: Diagnosis not present

## 2017-12-17 ENCOUNTER — Ambulatory Visit (HOSPITAL_COMMUNITY)
Admission: RE | Admit: 2017-12-17 | Discharge: 2017-12-17 | Disposition: A | Discharge status: Home / Self Care | Payer: Medicare Other | Source: Ambulatory Visit | Attending: Family Medicine | Admitting: Family Medicine

## 2017-12-17 DIAGNOSIS — F0390 Unspecified dementia without behavioral disturbance: Secondary | ICD-10-CM | POA: Diagnosis not present

## 2017-12-17 DIAGNOSIS — R1310 Dysphagia, unspecified: Secondary | ICD-10-CM | POA: Diagnosis not present

## 2017-12-17 DIAGNOSIS — D509 Iron deficiency anemia, unspecified: Secondary | ICD-10-CM | POA: Diagnosis not present

## 2017-12-17 DIAGNOSIS — S0100XD Unspecified open wound of scalp, subsequent encounter: Secondary | ICD-10-CM | POA: Diagnosis not present

## 2017-12-17 DIAGNOSIS — E1140 Type 2 diabetes mellitus with diabetic neuropathy, unspecified: Secondary | ICD-10-CM | POA: Diagnosis not present

## 2017-12-17 DIAGNOSIS — R1312 Dysphagia, oropharyngeal phase: Secondary | ICD-10-CM | POA: Diagnosis not present

## 2017-12-17 DIAGNOSIS — R1314 Dysphagia, pharyngoesophageal phase: Secondary | ICD-10-CM | POA: Diagnosis not present

## 2017-12-17 DIAGNOSIS — R05 Cough: Secondary | ICD-10-CM | POA: Diagnosis not present

## 2017-12-17 DIAGNOSIS — I10 Essential (primary) hypertension: Secondary | ICD-10-CM | POA: Diagnosis not present

## 2017-12-18 ENCOUNTER — Encounter: Payer: Self-pay | Admitting: Family Medicine

## 2017-12-18 ENCOUNTER — Ambulatory Visit (INDEPENDENT_AMBULATORY_CARE_PROVIDER_SITE_OTHER): Payer: Medicare Other | Admitting: Family Medicine

## 2017-12-18 VITALS — BP 110/58 | HR 70 | Temp 96.3°F | Ht 69.0 in

## 2017-12-18 DIAGNOSIS — D509 Iron deficiency anemia, unspecified: Secondary | ICD-10-CM | POA: Diagnosis not present

## 2017-12-18 DIAGNOSIS — G609 Hereditary and idiopathic neuropathy, unspecified: Secondary | ICD-10-CM | POA: Diagnosis not present

## 2017-12-18 DIAGNOSIS — F0391 Unspecified dementia with behavioral disturbance: Secondary | ICD-10-CM

## 2017-12-18 DIAGNOSIS — I10 Essential (primary) hypertension: Secondary | ICD-10-CM

## 2017-12-18 DIAGNOSIS — I480 Paroxysmal atrial fibrillation: Secondary | ICD-10-CM | POA: Diagnosis not present

## 2017-12-18 DIAGNOSIS — Z8701 Personal history of pneumonia (recurrent): Secondary | ICD-10-CM | POA: Diagnosis not present

## 2017-12-18 DIAGNOSIS — R1312 Dysphagia, oropharyngeal phase: Secondary | ICD-10-CM

## 2017-12-18 DIAGNOSIS — S0100XD Unspecified open wound of scalp, subsequent encounter: Secondary | ICD-10-CM | POA: Diagnosis not present

## 2017-12-18 DIAGNOSIS — R269 Unspecified abnormalities of gait and mobility: Secondary | ICD-10-CM | POA: Diagnosis not present

## 2017-12-18 DIAGNOSIS — F0390 Unspecified dementia without behavioral disturbance: Secondary | ICD-10-CM | POA: Diagnosis not present

## 2017-12-18 DIAGNOSIS — Z7984 Long term (current) use of oral hypoglycemic drugs: Secondary | ICD-10-CM | POA: Diagnosis not present

## 2017-12-18 DIAGNOSIS — Z950 Presence of cardiac pacemaker: Secondary | ICD-10-CM | POA: Diagnosis not present

## 2017-12-18 DIAGNOSIS — Z7982 Long term (current) use of aspirin: Secondary | ICD-10-CM | POA: Diagnosis not present

## 2017-12-18 DIAGNOSIS — Z7901 Long term (current) use of anticoagulants: Secondary | ICD-10-CM | POA: Diagnosis not present

## 2017-12-18 DIAGNOSIS — E1140 Type 2 diabetes mellitus with diabetic neuropathy, unspecified: Secondary | ICD-10-CM | POA: Diagnosis not present

## 2017-12-18 DIAGNOSIS — I495 Sick sinus syndrome: Secondary | ICD-10-CM | POA: Diagnosis not present

## 2017-12-18 DIAGNOSIS — I482 Chronic atrial fibrillation: Secondary | ICD-10-CM | POA: Diagnosis not present

## 2017-12-18 DIAGNOSIS — E785 Hyperlipidemia, unspecified: Secondary | ICD-10-CM | POA: Diagnosis not present

## 2017-12-18 DIAGNOSIS — R1310 Dysphagia, unspecified: Secondary | ICD-10-CM | POA: Diagnosis not present

## 2017-12-18 NOTE — Progress Notes (Signed)
   Subjective:    Patient ID: Andrew Norton, male    DOB: Nov 13, 1926, 84 y.o.   MRN: [id number]  HPI Here with his wife and son to check him after a controlled fall earlier this afternoon. He was between his wife and son going out of his house down some steps to get into their car. It sounds like he missed a step and lost his balance, but he had a soft fall since he was supported by them. No head trauma. No LOC. No obvious injuries. He denies any pain today. He has been very weak and he tends to fall frequently. He is getting PT and OT. He had a modified swallowing study yesterday and this confirmed frequent aspiration of liquids. He seems to handle solids well if he chews slowly but thin liquids cause choking and aspiration. They recommended consuming nectar thick liquids, to avoid any use of drinking straws, and to eat and drink only from an upright position.    Review of Systems  Constitutional: Positive for fatigue. Negative for chills, diaphoresis and fever.  HENT: Positive for trouble swallowing.   Respiratory: Negative.   Cardiovascular: Negative.   Gastrointestinal: Negative.   Neurological: Positive for weakness. Negative for dizziness.       Objective:   Physical Exam  Constitutional: He is oriented to person, place, and time.  Alert but weak and frail, in a wheelchair   HENT:  Head: Normocephalic and atraumatic.  Eyes: Pupils are equal, round, and reactive to light. EOM are normal.  Neck: Neck supple. No thyromegaly present.  Cardiovascular: Normal rate, regular rhythm, normal heart sounds and intact distal pulses.  Pulmonary/Chest: Effort normal. No respiratory distress. He has no wheezes.  Dry crackles at both bases   Lymphadenopathy:    He has no cervical adenopathy.  Neurological: He is alert and oriented to person, place, and time.          Assessment & Plan:  He had a fall today from a number of factors including generalized weakness, poor balance, and orthostatic  BP drops. He will rest and drink fluids this evening. He had no apparent injuries. We will maintain his current medications. We discussed the results of his swallowing study. He is in sinus rhythm today.  Alysia Penna, MD

## 2017-12-19 DIAGNOSIS — D509 Iron deficiency anemia, unspecified: Secondary | ICD-10-CM | POA: Diagnosis not present

## 2017-12-19 DIAGNOSIS — R1310 Dysphagia, unspecified: Secondary | ICD-10-CM | POA: Diagnosis not present

## 2017-12-19 DIAGNOSIS — E1140 Type 2 diabetes mellitus with diabetic neuropathy, unspecified: Secondary | ICD-10-CM | POA: Diagnosis not present

## 2017-12-19 DIAGNOSIS — I10 Essential (primary) hypertension: Secondary | ICD-10-CM | POA: Diagnosis not present

## 2017-12-19 DIAGNOSIS — S0100XD Unspecified open wound of scalp, subsequent encounter: Secondary | ICD-10-CM | POA: Diagnosis not present

## 2017-12-19 DIAGNOSIS — F0390 Unspecified dementia without behavioral disturbance: Secondary | ICD-10-CM | POA: Diagnosis not present

## 2017-12-20 DIAGNOSIS — D509 Iron deficiency anemia, unspecified: Secondary | ICD-10-CM | POA: Diagnosis not present

## 2017-12-20 DIAGNOSIS — S0100XD Unspecified open wound of scalp, subsequent encounter: Secondary | ICD-10-CM | POA: Diagnosis not present

## 2017-12-20 DIAGNOSIS — I10 Essential (primary) hypertension: Secondary | ICD-10-CM | POA: Diagnosis not present

## 2017-12-20 DIAGNOSIS — F0390 Unspecified dementia without behavioral disturbance: Secondary | ICD-10-CM | POA: Diagnosis not present

## 2017-12-20 DIAGNOSIS — E1140 Type 2 diabetes mellitus with diabetic neuropathy, unspecified: Secondary | ICD-10-CM | POA: Diagnosis not present

## 2017-12-20 DIAGNOSIS — R1310 Dysphagia, unspecified: Secondary | ICD-10-CM | POA: Diagnosis not present

## 2017-12-23 DIAGNOSIS — R1310 Dysphagia, unspecified: Secondary | ICD-10-CM | POA: Diagnosis not present

## 2017-12-23 DIAGNOSIS — I10 Essential (primary) hypertension: Secondary | ICD-10-CM | POA: Diagnosis not present

## 2017-12-23 DIAGNOSIS — E1140 Type 2 diabetes mellitus with diabetic neuropathy, unspecified: Secondary | ICD-10-CM | POA: Diagnosis not present

## 2017-12-23 DIAGNOSIS — F0390 Unspecified dementia without behavioral disturbance: Secondary | ICD-10-CM | POA: Diagnosis not present

## 2017-12-23 DIAGNOSIS — S0100XD Unspecified open wound of scalp, subsequent encounter: Secondary | ICD-10-CM | POA: Diagnosis not present

## 2017-12-23 DIAGNOSIS — D509 Iron deficiency anemia, unspecified: Secondary | ICD-10-CM | POA: Diagnosis not present

## 2017-12-24 ENCOUNTER — Other Ambulatory Visit: Payer: Self-pay | Admitting: Family Medicine

## 2017-12-24 DIAGNOSIS — H109 Unspecified conjunctivitis: Secondary | ICD-10-CM | POA: Diagnosis not present

## 2017-12-24 NOTE — Telephone Encounter (Signed)
Last OV 12/18/2017

## 2017-12-25 DIAGNOSIS — D509 Iron deficiency anemia, unspecified: Secondary | ICD-10-CM | POA: Diagnosis not present

## 2017-12-25 DIAGNOSIS — R1310 Dysphagia, unspecified: Secondary | ICD-10-CM | POA: Diagnosis not present

## 2017-12-25 DIAGNOSIS — E1140 Type 2 diabetes mellitus with diabetic neuropathy, unspecified: Secondary | ICD-10-CM | POA: Diagnosis not present

## 2017-12-25 DIAGNOSIS — I10 Essential (primary) hypertension: Secondary | ICD-10-CM | POA: Diagnosis not present

## 2017-12-25 DIAGNOSIS — S0100XD Unspecified open wound of scalp, subsequent encounter: Secondary | ICD-10-CM | POA: Diagnosis not present

## 2017-12-25 DIAGNOSIS — F0390 Unspecified dementia without behavioral disturbance: Secondary | ICD-10-CM | POA: Diagnosis not present

## 2017-12-26 ENCOUNTER — Telehealth: Payer: Self-pay | Admitting: Family Medicine

## 2017-12-26 DIAGNOSIS — I10 Essential (primary) hypertension: Secondary | ICD-10-CM | POA: Diagnosis not present

## 2017-12-26 DIAGNOSIS — S0100XD Unspecified open wound of scalp, subsequent encounter: Secondary | ICD-10-CM | POA: Diagnosis not present

## 2017-12-26 DIAGNOSIS — D509 Iron deficiency anemia, unspecified: Secondary | ICD-10-CM | POA: Diagnosis not present

## 2017-12-26 DIAGNOSIS — R1310 Dysphagia, unspecified: Secondary | ICD-10-CM | POA: Diagnosis not present

## 2017-12-26 DIAGNOSIS — E1140 Type 2 diabetes mellitus with diabetic neuropathy, unspecified: Secondary | ICD-10-CM | POA: Diagnosis not present

## 2017-12-26 DIAGNOSIS — F0390 Unspecified dementia without behavioral disturbance: Secondary | ICD-10-CM | POA: Diagnosis not present

## 2017-12-26 NOTE — Telephone Encounter (Signed)
Sent to PCP for the OK for verbal orders  

## 2017-12-26 NOTE — Telephone Encounter (Signed)
Please okay these orders  ?

## 2017-12-26 NOTE — Telephone Encounter (Signed)
Called and left a detailed VM for [facility] with pt's name and DOB giving the OK for the verbal orders requested. Left my name and call back number if needed.

## 2017-12-26 NOTE — Telephone Encounter (Signed)
Copied from [place] [phone]. Topic: Quick Communication - See Telephone Encounter >> Dec 26, 2017  9:04 AM Robina Ade, Helene Kelp D wrote: CRM for notification. See Telephone encounter for: 12/26/17. Ebony Hail with [facility] called and would like a verbal order for patient as follow: she needs speech therapy for patient for swallowing 1X a week for 2 weeks. Her call back number is [phone].

## 2017-12-27 ENCOUNTER — Telehealth: Payer: Self-pay | Admitting: Family Medicine

## 2017-12-27 NOTE — Telephone Encounter (Signed)
Please advise 

## 2017-12-27 NOTE — Telephone Encounter (Signed)
Copied from [place] [phone]. Topic: General - Other >> Dec 27, 2017  9:10 AM Margot Ables wrote: Reason for CRM: requesting PT orders for 1x week for 3 weeks for fall risk reduction & referral for medical social worker for community resources. Secure VM so ok to leave detailed msg.

## 2017-12-27 NOTE — Telephone Encounter (Signed)
Please okay these orders  ?

## 2017-12-27 NOTE — Telephone Encounter (Signed)
Verbal orders given to Jim. 

## 2017-12-30 ENCOUNTER — Telehealth: Payer: Self-pay

## 2017-12-30 DIAGNOSIS — R1310 Dysphagia, unspecified: Secondary | ICD-10-CM | POA: Diagnosis not present

## 2017-12-30 DIAGNOSIS — E1140 Type 2 diabetes mellitus with diabetic neuropathy, unspecified: Secondary | ICD-10-CM | POA: Diagnosis not present

## 2017-12-30 DIAGNOSIS — I10 Essential (primary) hypertension: Secondary | ICD-10-CM | POA: Diagnosis not present

## 2017-12-30 DIAGNOSIS — F0390 Unspecified dementia without behavioral disturbance: Secondary | ICD-10-CM | POA: Diagnosis not present

## 2017-12-30 DIAGNOSIS — S0100XD Unspecified open wound of scalp, subsequent encounter: Secondary | ICD-10-CM | POA: Diagnosis not present

## 2017-12-30 DIAGNOSIS — D509 Iron deficiency anemia, unspecified: Secondary | ICD-10-CM | POA: Diagnosis not present

## 2017-12-30 NOTE — Telephone Encounter (Signed)
Copied from [place] [id number]. Topic: Inquiry >> Dec 30, 2017  3:14 PM Oliver Pila B wrote: Reason for CRM: Alonna Minium called (speech pathologist) she did his discharge visit today, he is going to remain for high risk for aspiration; the family has decided to continue the same way and monitor for pneumonia; contact Ebony Hail [phone]

## 2017-12-31 DIAGNOSIS — I10 Essential (primary) hypertension: Secondary | ICD-10-CM | POA: Diagnosis not present

## 2017-12-31 DIAGNOSIS — E1140 Type 2 diabetes mellitus with diabetic neuropathy, unspecified: Secondary | ICD-10-CM | POA: Diagnosis not present

## 2017-12-31 DIAGNOSIS — L84 Corns and callosities: Secondary | ICD-10-CM | POA: Diagnosis not present

## 2017-12-31 DIAGNOSIS — L602 Onychogryphosis: Secondary | ICD-10-CM | POA: Diagnosis not present

## 2017-12-31 DIAGNOSIS — F0390 Unspecified dementia without behavioral disturbance: Secondary | ICD-10-CM | POA: Diagnosis not present

## 2017-12-31 DIAGNOSIS — D509 Iron deficiency anemia, unspecified: Secondary | ICD-10-CM | POA: Diagnosis not present

## 2017-12-31 DIAGNOSIS — E1351 Other specified diabetes mellitus with diabetic peripheral angiopathy without gangrene: Secondary | ICD-10-CM | POA: Diagnosis not present

## 2017-12-31 DIAGNOSIS — S0100XD Unspecified open wound of scalp, subsequent encounter: Secondary | ICD-10-CM | POA: Diagnosis not present

## 2017-12-31 DIAGNOSIS — R1310 Dysphagia, unspecified: Secondary | ICD-10-CM | POA: Diagnosis not present

## 2017-12-31 NOTE — Telephone Encounter (Signed)
FYI

## 2018-01-01 DIAGNOSIS — F0390 Unspecified dementia without behavioral disturbance: Secondary | ICD-10-CM | POA: Diagnosis not present

## 2018-01-01 DIAGNOSIS — I10 Essential (primary) hypertension: Secondary | ICD-10-CM | POA: Diagnosis not present

## 2018-01-01 DIAGNOSIS — R1310 Dysphagia, unspecified: Secondary | ICD-10-CM | POA: Diagnosis not present

## 2018-01-01 DIAGNOSIS — S0100XD Unspecified open wound of scalp, subsequent encounter: Secondary | ICD-10-CM | POA: Diagnosis not present

## 2018-01-01 DIAGNOSIS — D509 Iron deficiency anemia, unspecified: Secondary | ICD-10-CM | POA: Diagnosis not present

## 2018-01-01 DIAGNOSIS — E1140 Type 2 diabetes mellitus with diabetic neuropathy, unspecified: Secondary | ICD-10-CM | POA: Diagnosis not present

## 2018-01-01 DIAGNOSIS — H04123 Dry eye syndrome of bilateral lacrimal glands: Secondary | ICD-10-CM | POA: Diagnosis not present

## 2018-01-01 DIAGNOSIS — H109 Unspecified conjunctivitis: Secondary | ICD-10-CM | POA: Diagnosis not present

## 2018-01-02 DIAGNOSIS — S0100XD Unspecified open wound of scalp, subsequent encounter: Secondary | ICD-10-CM | POA: Diagnosis not present

## 2018-01-02 DIAGNOSIS — R1310 Dysphagia, unspecified: Secondary | ICD-10-CM | POA: Diagnosis not present

## 2018-01-02 DIAGNOSIS — D509 Iron deficiency anemia, unspecified: Secondary | ICD-10-CM | POA: Diagnosis not present

## 2018-01-02 DIAGNOSIS — E1140 Type 2 diabetes mellitus with diabetic neuropathy, unspecified: Secondary | ICD-10-CM | POA: Diagnosis not present

## 2018-01-02 DIAGNOSIS — F0390 Unspecified dementia without behavioral disturbance: Secondary | ICD-10-CM | POA: Diagnosis not present

## 2018-01-02 DIAGNOSIS — I10 Essential (primary) hypertension: Secondary | ICD-10-CM | POA: Diagnosis not present

## 2018-01-03 DIAGNOSIS — R1310 Dysphagia, unspecified: Secondary | ICD-10-CM | POA: Diagnosis not present

## 2018-01-03 DIAGNOSIS — C4442 Squamous cell carcinoma of skin of scalp and neck: Secondary | ICD-10-CM | POA: Diagnosis not present

## 2018-01-03 DIAGNOSIS — F0390 Unspecified dementia without behavioral disturbance: Secondary | ICD-10-CM | POA: Diagnosis not present

## 2018-01-03 DIAGNOSIS — D509 Iron deficiency anemia, unspecified: Secondary | ICD-10-CM | POA: Diagnosis not present

## 2018-01-03 DIAGNOSIS — I10 Essential (primary) hypertension: Secondary | ICD-10-CM | POA: Diagnosis not present

## 2018-01-03 DIAGNOSIS — Z08 Encounter for follow-up examination after completed treatment for malignant neoplasm: Secondary | ICD-10-CM | POA: Diagnosis not present

## 2018-01-03 DIAGNOSIS — E1140 Type 2 diabetes mellitus with diabetic neuropathy, unspecified: Secondary | ICD-10-CM | POA: Diagnosis not present

## 2018-01-03 DIAGNOSIS — Z85828 Personal history of other malignant neoplasm of skin: Secondary | ICD-10-CM | POA: Diagnosis not present

## 2018-01-03 DIAGNOSIS — L821 Other seborrheic keratosis: Secondary | ICD-10-CM | POA: Diagnosis not present

## 2018-01-03 DIAGNOSIS — S0100XD Unspecified open wound of scalp, subsequent encounter: Secondary | ICD-10-CM | POA: Diagnosis not present

## 2018-01-06 DIAGNOSIS — D509 Iron deficiency anemia, unspecified: Secondary | ICD-10-CM | POA: Diagnosis not present

## 2018-01-06 DIAGNOSIS — E1140 Type 2 diabetes mellitus with diabetic neuropathy, unspecified: Secondary | ICD-10-CM | POA: Diagnosis not present

## 2018-01-06 DIAGNOSIS — I10 Essential (primary) hypertension: Secondary | ICD-10-CM | POA: Diagnosis not present

## 2018-01-06 DIAGNOSIS — S0100XD Unspecified open wound of scalp, subsequent encounter: Secondary | ICD-10-CM | POA: Diagnosis not present

## 2018-01-06 DIAGNOSIS — R1310 Dysphagia, unspecified: Secondary | ICD-10-CM | POA: Diagnosis not present

## 2018-01-06 DIAGNOSIS — F0390 Unspecified dementia without behavioral disturbance: Secondary | ICD-10-CM | POA: Diagnosis not present

## 2018-01-07 ENCOUNTER — Ambulatory Visit (INDEPENDENT_AMBULATORY_CARE_PROVIDER_SITE_OTHER): Payer: Medicare Other | Admitting: *Deleted

## 2018-01-07 DIAGNOSIS — E538 Deficiency of other specified B group vitamins: Secondary | ICD-10-CM | POA: Diagnosis not present

## 2018-01-07 MED ORDER — CYANOCOBALAMIN 1000 MCG/ML IJ SOLN
1000.0000 ug | Freq: Once | INTRAMUSCULAR | Status: AC
  Administered 2018-01-07: 1000 ug via INTRAMUSCULAR

## 2018-01-07 NOTE — Progress Notes (Signed)
Per orders of Dr. Sarajane Jews, injection of B12 given by Dorrene German. Patient tolerated injection well.

## 2018-01-08 ENCOUNTER — Telehealth: Payer: Self-pay | Admitting: Family Medicine

## 2018-01-08 ENCOUNTER — Telehealth: Payer: Self-pay | Admitting: Cardiology

## 2018-01-08 DIAGNOSIS — D509 Iron deficiency anemia, unspecified: Secondary | ICD-10-CM | POA: Diagnosis not present

## 2018-01-08 DIAGNOSIS — F0390 Unspecified dementia without behavioral disturbance: Secondary | ICD-10-CM | POA: Diagnosis not present

## 2018-01-08 DIAGNOSIS — E1140 Type 2 diabetes mellitus with diabetic neuropathy, unspecified: Secondary | ICD-10-CM | POA: Diagnosis not present

## 2018-01-08 DIAGNOSIS — I10 Essential (primary) hypertension: Secondary | ICD-10-CM | POA: Diagnosis not present

## 2018-01-08 DIAGNOSIS — S0100XD Unspecified open wound of scalp, subsequent encounter: Secondary | ICD-10-CM | POA: Diagnosis not present

## 2018-01-08 DIAGNOSIS — R1310 Dysphagia, unspecified: Secondary | ICD-10-CM | POA: Diagnosis not present

## 2018-01-08 NOTE — Telephone Encounter (Signed)
I think this is just FYI. There is also a phone note into his cardiologist that states this has been going on for years.

## 2018-01-08 NOTE — Telephone Encounter (Signed)
Left a message for Andrew Norton [facility]) to call back.

## 2018-01-08 NOTE — Telephone Encounter (Signed)
New Message:       Clair Gulling from [facility] is calling to give a report on the pt on his lower extremity edema +3 has been going on for years and it doesn't matter whether he is sitting or laying down and pt is more fatigue than normally and pt does not do daily weights.

## 2018-01-08 NOTE — Telephone Encounter (Signed)
Yes this has been chronic

## 2018-01-08 NOTE — Telephone Encounter (Signed)
Follow up   Please call Clair Gulling at [facility]

## 2018-01-08 NOTE — Telephone Encounter (Signed)
Copied from [place] [phone]. Topic: Inquiry >> Jan 08, 2018  2:42 PM Lennox Solders wrote: Reason for CRM:jim hoffman PT from [facility] is calling . Pt has chronic ankle edema ranging for 2+ to 3+ today is 3+ Pt is reporting mildly fatigue increase compare to last week. The cause of edema is unknown it occurs when he is in bed , standing and sitting. No diagnosis of CHF found in chart. Dr Curt Bears cardiologist has been notified

## 2018-01-09 DIAGNOSIS — E1140 Type 2 diabetes mellitus with diabetic neuropathy, unspecified: Secondary | ICD-10-CM | POA: Diagnosis not present

## 2018-01-09 DIAGNOSIS — S0100XD Unspecified open wound of scalp, subsequent encounter: Secondary | ICD-10-CM | POA: Diagnosis not present

## 2018-01-09 DIAGNOSIS — R1310 Dysphagia, unspecified: Secondary | ICD-10-CM | POA: Diagnosis not present

## 2018-01-09 DIAGNOSIS — I10 Essential (primary) hypertension: Secondary | ICD-10-CM | POA: Diagnosis not present

## 2018-01-09 DIAGNOSIS — D509 Iron deficiency anemia, unspecified: Secondary | ICD-10-CM | POA: Diagnosis not present

## 2018-01-09 DIAGNOSIS — F0390 Unspecified dementia without behavioral disturbance: Secondary | ICD-10-CM | POA: Diagnosis not present

## 2018-01-09 NOTE — Telephone Encounter (Signed)
Informed Jim, HHPT, that Dr. Curt Bears follows this pt for his PPM.   Informed that pt has no hx of HF and not sure why he is experiencing chronic LEE nor fatigue. Explained that pt has only been followed by Dr. Curt Bears since PPM implanted in 05/2016.  Informed that last echo showed normal EF. Clair Gulling is going to follow up w/ PCP in reference to this matter.  Advised to have pt referred to cardiology/[place]  if PCP feels more cardiology work up is needed. Clair Gulling is agreeable to plan.

## 2018-01-13 DIAGNOSIS — I10 Essential (primary) hypertension: Secondary | ICD-10-CM | POA: Diagnosis not present

## 2018-01-13 DIAGNOSIS — H34832 Tributary (branch) retinal vein occlusion, left eye, with macular edema: Secondary | ICD-10-CM | POA: Diagnosis not present

## 2018-01-13 DIAGNOSIS — E1140 Type 2 diabetes mellitus with diabetic neuropathy, unspecified: Secondary | ICD-10-CM | POA: Diagnosis not present

## 2018-01-13 DIAGNOSIS — F0390 Unspecified dementia without behavioral disturbance: Secondary | ICD-10-CM | POA: Diagnosis not present

## 2018-01-13 DIAGNOSIS — D509 Iron deficiency anemia, unspecified: Secondary | ICD-10-CM | POA: Diagnosis not present

## 2018-01-13 DIAGNOSIS — H353132 Nonexudative age-related macular degeneration, bilateral, intermediate dry stage: Secondary | ICD-10-CM | POA: Diagnosis not present

## 2018-01-13 DIAGNOSIS — H35433 Paving stone degeneration of retina, bilateral: Secondary | ICD-10-CM | POA: Diagnosis not present

## 2018-01-13 DIAGNOSIS — R1310 Dysphagia, unspecified: Secondary | ICD-10-CM | POA: Diagnosis not present

## 2018-01-13 DIAGNOSIS — H35033 Hypertensive retinopathy, bilateral: Secondary | ICD-10-CM | POA: Diagnosis not present

## 2018-01-13 DIAGNOSIS — S0100XD Unspecified open wound of scalp, subsequent encounter: Secondary | ICD-10-CM | POA: Diagnosis not present

## 2018-01-14 DIAGNOSIS — R1310 Dysphagia, unspecified: Secondary | ICD-10-CM | POA: Diagnosis not present

## 2018-01-14 DIAGNOSIS — D509 Iron deficiency anemia, unspecified: Secondary | ICD-10-CM | POA: Diagnosis not present

## 2018-01-14 DIAGNOSIS — I10 Essential (primary) hypertension: Secondary | ICD-10-CM | POA: Diagnosis not present

## 2018-01-14 DIAGNOSIS — D485 Neoplasm of uncertain behavior of skin: Secondary | ICD-10-CM | POA: Diagnosis not present

## 2018-01-14 DIAGNOSIS — E1140 Type 2 diabetes mellitus with diabetic neuropathy, unspecified: Secondary | ICD-10-CM | POA: Diagnosis not present

## 2018-01-14 DIAGNOSIS — S0100XA Unspecified open wound of scalp, initial encounter: Secondary | ICD-10-CM | POA: Diagnosis not present

## 2018-01-14 DIAGNOSIS — F0390 Unspecified dementia without behavioral disturbance: Secondary | ICD-10-CM | POA: Diagnosis not present

## 2018-01-14 DIAGNOSIS — S0100XD Unspecified open wound of scalp, subsequent encounter: Secondary | ICD-10-CM | POA: Diagnosis not present

## 2018-02-05 DIAGNOSIS — L0100 Impetigo, unspecified: Secondary | ICD-10-CM | POA: Diagnosis not present

## 2018-02-05 DIAGNOSIS — Z08 Encounter for follow-up examination after completed treatment for malignant neoplasm: Secondary | ICD-10-CM | POA: Diagnosis not present

## 2018-02-05 DIAGNOSIS — L820 Inflamed seborrheic keratosis: Secondary | ICD-10-CM | POA: Diagnosis not present

## 2018-02-05 DIAGNOSIS — Z85828 Personal history of other malignant neoplasm of skin: Secondary | ICD-10-CM | POA: Diagnosis not present

## 2018-02-06 ENCOUNTER — Ambulatory Visit (INDEPENDENT_AMBULATORY_CARE_PROVIDER_SITE_OTHER): Payer: Medicare Other | Admitting: Family Medicine

## 2018-02-06 DIAGNOSIS — E538 Deficiency of other specified B group vitamins: Secondary | ICD-10-CM | POA: Diagnosis not present

## 2018-02-06 MED ORDER — CYANOCOBALAMIN 1000 MCG/ML IJ SOLN
1000.0000 ug | Freq: Once | INTRAMUSCULAR | Status: AC
  Administered 2018-02-06: 1000 ug via INTRAMUSCULAR

## 2018-02-06 NOTE — Progress Notes (Signed)
Per orders of Dr. Fry, injection of Vitamin B 12 given by NIMMONS, SYLVIA ANN. Patient tolerated injection well. 

## 2018-02-18 DIAGNOSIS — C44329 Squamous cell carcinoma of skin of other parts of face: Secondary | ICD-10-CM | POA: Diagnosis not present

## 2018-02-18 DIAGNOSIS — C44229 Squamous cell carcinoma of skin of left ear and external auricular canal: Secondary | ICD-10-CM | POA: Diagnosis not present

## 2018-02-18 DIAGNOSIS — Z1283 Encounter for screening for malignant neoplasm of skin: Secondary | ICD-10-CM | POA: Diagnosis not present

## 2018-02-18 DIAGNOSIS — L980 Pyogenic granuloma: Secondary | ICD-10-CM | POA: Diagnosis not present

## 2018-02-24 ENCOUNTER — Ambulatory Visit: Payer: Self-pay | Admitting: Family Medicine

## 2018-02-24 NOTE — Telephone Encounter (Signed)
Patient fell Friday night- wife is calling to report her husband is not feeling well. Left arm pain- that is his weaker side- patient does not use it much. Patient has started breathing through his mouth for 6 weeks.patient is still having problems with sleeping through the night. Speech pattern has changed over time- doesn't sound the way he used to sound- demeanor has changed. Wife is requesting an appointment to have her husband evaluated for changes- she does not feel it is emergent- but her husband reports he has not been feeling well for some time. Call to office- patient will need 30 minute slot- appointment scheduled for Wednesday am- wife to call if her husband has any changes before that appointment.  Reason for Disposition . [1] MODERATE dizziness (e.g., interferes with normal activities) AND [2] has been evaluated by physician for this  Answer Assessment - Initial Assessment Questions 1. DESCRIPTION: "Describe your dizziness."     Several weeks of dizziness 2. LIGHTHEADED: "Do you feel lightheaded?" (e.g., somewhat faint, woozy, weak upon standing)     Patient has felt lightheaded not good for several weeks 3. VERTIGO: "Do you feel like either you or the room is spinning or tilting?" (i.e. vertigo)     n/a 4. SEVERITY: "How bad is it?"  "Do you feel like you are going to faint?" "Can you stand and walk?"   - MILD - walking normally   - MODERATE - interferes with normal activities (e.g., work, school)    - SEVERE - unable to stand, requires support to walk, feels like passing out now.      Patient uses a walker- very weak now- using wheel chair 5. ONSET:  "When did the dizziness begin?"     ongoing 6. AGGRAVATING FACTORS: "Does anything make it worse?" (e.g., standing, change in head position)     Getting weaker 7. HEART RATE: "Can you tell me your heart rate?" "How many beats in 15 seconds?"  (Note: not all patients can do this)       Heart rate has been monitored for pacemaker-  wife is not sure- not complaining of pain 8. CAUSE: "What do you think is causing the dizziness?"     Weakness- possible change 9. RECURRENT SYMPTOM: "Have you had dizziness before?" If so, ask: "When was the last time?" "What happened that time?"     Patient has had falls before- June was last one- patient slid to floor with wife behind him Friday 10. OTHER SYMPTOMS: "Do you have any other symptoms?" (e.g., fever, chest pain, vomiting, diarrhea, bleeding)       no 11. PREGNANCY: "Is there any chance you are pregnant?" "When was your last menstrual period?"       n/a  Protocols used: DIZZINESS [facility]

## 2018-02-25 IMAGING — CR DG CHEST 2V
2 series · 2 of 2 positions shown · non-contrast
Comparison: Radiographs August 21, 2017.

CLINICAL DATA: Pneumonia.

EXAM:
CHEST  2 VIEW

[w chest pa]
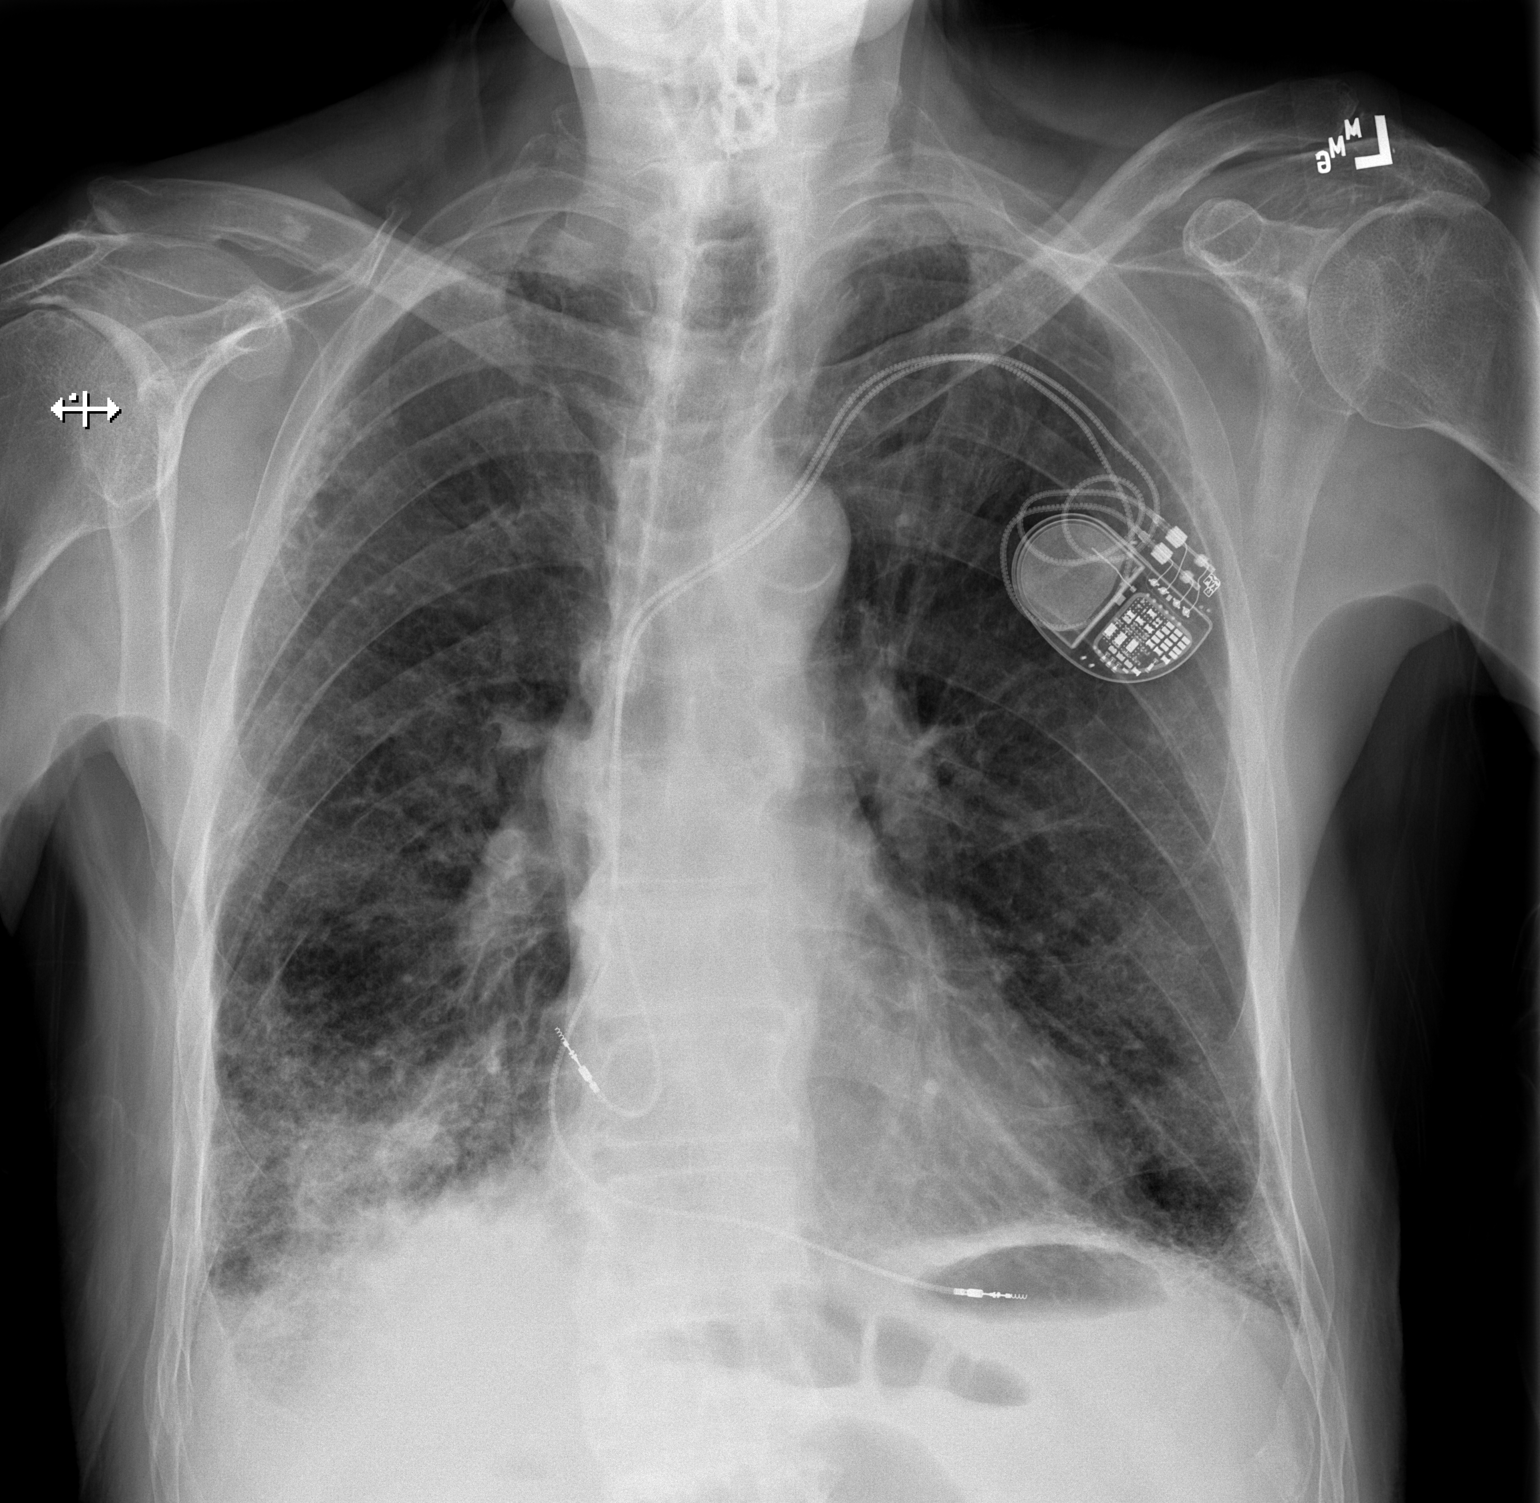

[w chest lat]
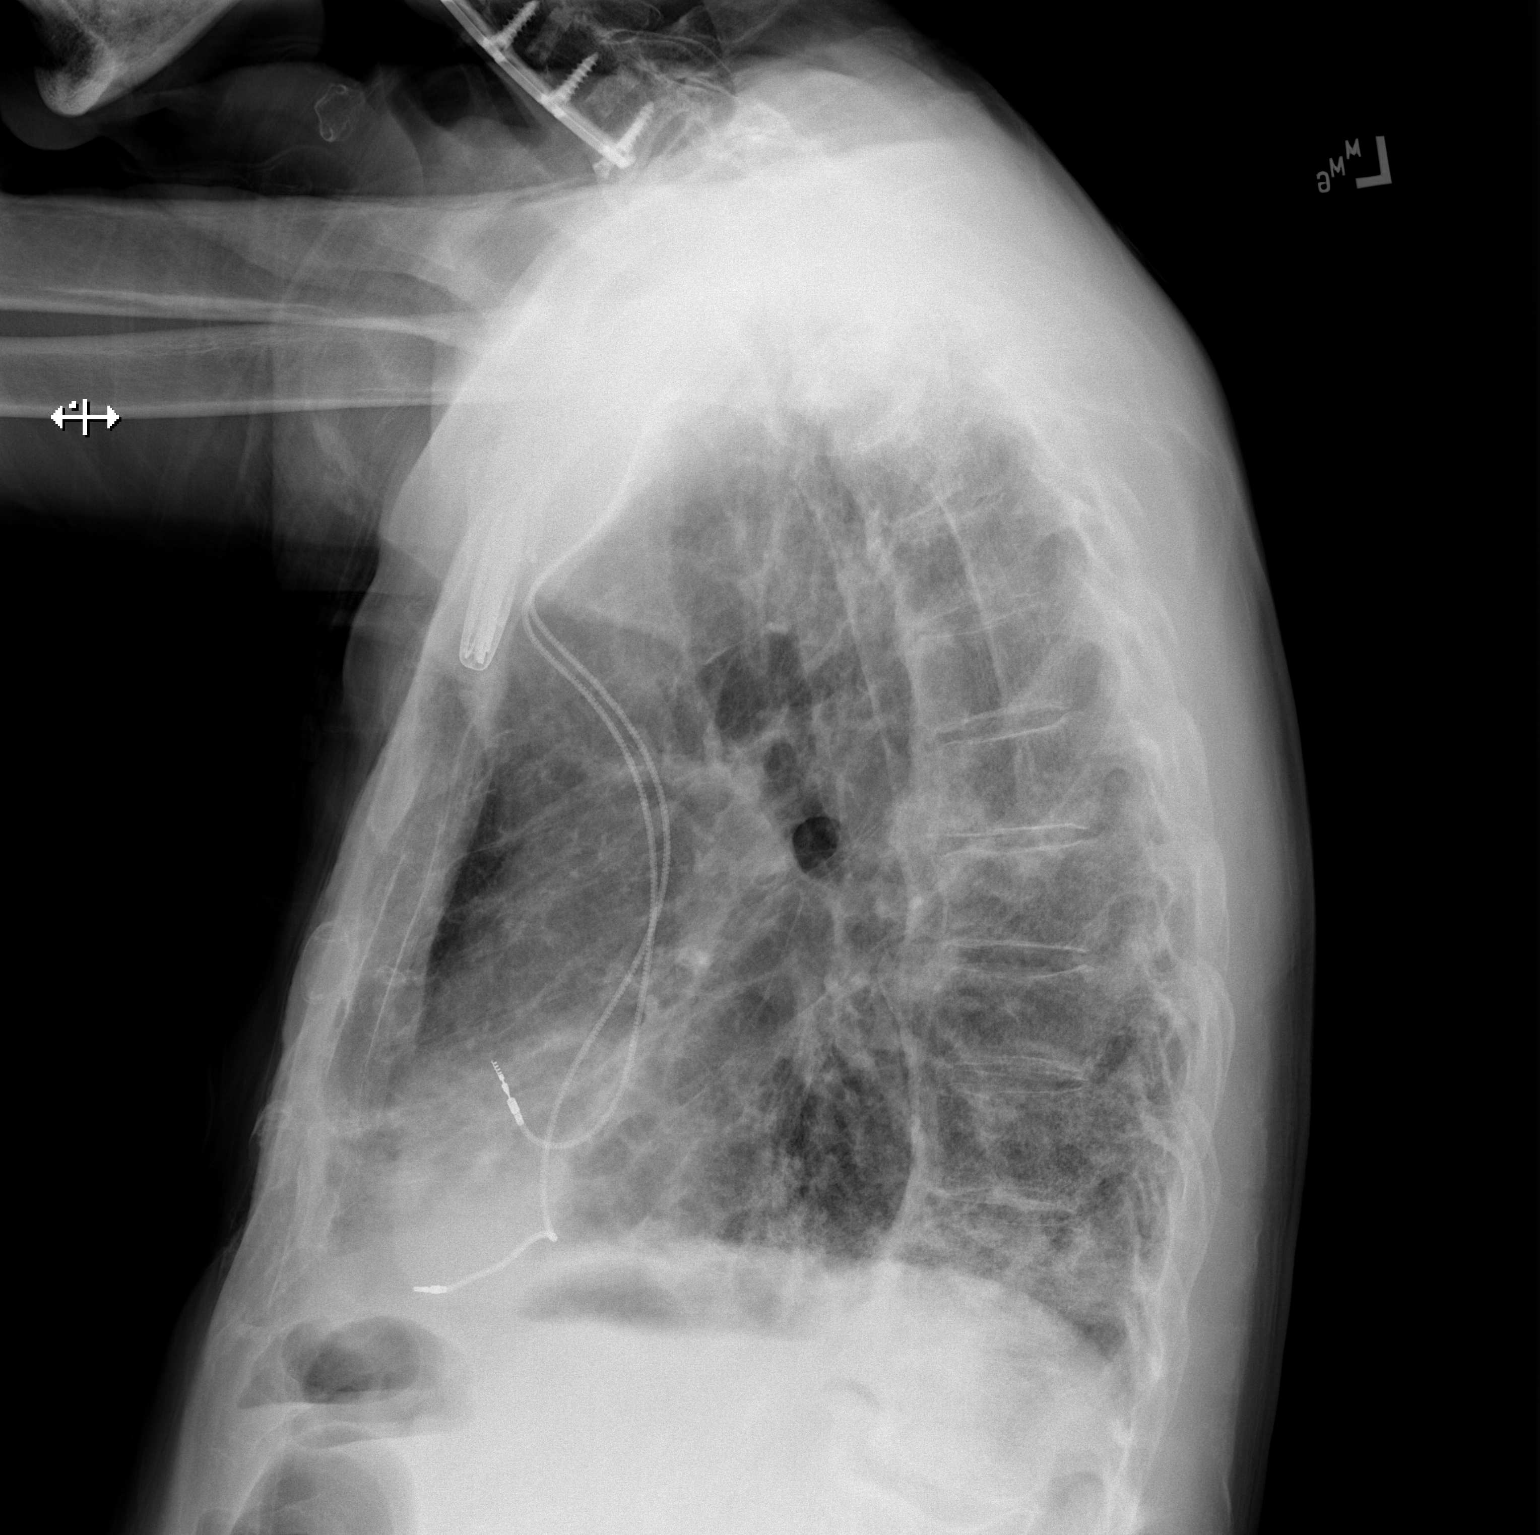

[2 of 2 positions shown; findings below may reference images not displayed]

FINDINGS: The heart size and mediastinal contours are within normal limits.
Atherosclerosis of thoracic aorta is noted. Left-sided pacemaker is
unchanged in position. No pneumothorax or pleural effusion is noted.
Stable diffuse reticular densities are noted throughout both lungs
consistent with chronic interstitial lung disease or scarring.
Stable right basilar opacity is noted concerning for pneumonia. The
visualized skeletal structures are unremarkable.
IMPRESSION: Aortic atherosclerosis. Stable right basilar opacity is noted most
consistent with pneumonia. Followup PA and lateral chest X-ray is
recommended in 3-4 weeks following trial of antibiotic therapy to
ensure resolution and exclude underlying malignancy.

## 2018-02-26 ENCOUNTER — Ambulatory Visit (INDEPENDENT_AMBULATORY_CARE_PROVIDER_SITE_OTHER): Payer: Medicare Other

## 2018-02-26 ENCOUNTER — Encounter: Payer: Self-pay | Admitting: Family Medicine

## 2018-02-26 ENCOUNTER — Ambulatory Visit (INDEPENDENT_AMBULATORY_CARE_PROVIDER_SITE_OTHER): Payer: Medicare Other | Admitting: Family Medicine

## 2018-02-26 ENCOUNTER — Telehealth: Payer: Self-pay | Admitting: Family Medicine

## 2018-02-26 VITALS — BP 126/52 | HR 75 | Temp 97.9°F

## 2018-02-26 DIAGNOSIS — E119 Type 2 diabetes mellitus without complications: Secondary | ICD-10-CM

## 2018-02-26 DIAGNOSIS — R059 Cough, unspecified: Secondary | ICD-10-CM

## 2018-02-26 DIAGNOSIS — R05 Cough: Secondary | ICD-10-CM

## 2018-02-26 DIAGNOSIS — J189 Pneumonia, unspecified organism: Secondary | ICD-10-CM

## 2018-02-26 DIAGNOSIS — J181 Lobar pneumonia, unspecified organism: Secondary | ICD-10-CM

## 2018-02-26 DIAGNOSIS — R0602 Shortness of breath: Secondary | ICD-10-CM | POA: Diagnosis not present

## 2018-02-26 LAB — BASIC METABOLIC PANEL
BUN: 21 mg/dL (ref 6–23)
CO2: 28 mEq/L (ref 19–32)
Calcium: 8.8 mg/dL (ref 8.4–10.5)
Chloride: 94 mEq/L — ABNORMAL LOW (ref 96–112)
Creatinine, Ser: 0.78 mg/dL (ref 0.40–1.50)
GFR: 99.52 mL/min (ref >60.00)
Glucose, Bld: 228 mg/dL — ABNORMAL HIGH (ref 70–99)
POTASSIUM: 5.2 meq/L — AB (ref 3.5–5.1)
SODIUM: 129 meq/L — AB (ref 135–145)

## 2018-02-26 LAB — CBC WITH DIFFERENTIAL/PLATELET
Basophils Absolute: 0 10*3/uL (ref 0.0–0.1)
Basophils Relative: 0.5 % (ref 0.0–3.0)
EOS PCT: 1.5 % (ref 0.0–5.0)
Eosinophils Absolute: 0.1 10*3/uL (ref 0.0–0.7)
HCT: 28.4 % — ABNORMAL LOW (ref 39.0–52.0)
HEMOGLOBIN: 9.5 g/dL — AB (ref 13.0–17.0)
Lymphocytes Relative: 13.9 % (ref 12.0–46.0)
Lymphs Abs: 1.1 10*3/uL (ref 0.7–4.0)
MCHC: 33.6 g/dL (ref 30.0–36.0)
MCV: 89.4 fl (ref 78.0–100.0)
MONO ABS: 0.7 10*3/uL (ref 0.1–1.0)
Monocytes Relative: 8.7 % (ref 3.0–12.0)
Neutro Abs: 6.1 10*3/uL (ref 1.4–7.7)
Neutrophils Relative %: 75.4 % (ref 43.0–77.0)
Platelets: 311 10*3/uL (ref 150.0–400.0)
RBC: 3.18 Mil/uL — AB (ref 4.22–5.81)
RDW: 14.9 % (ref 11.5–15.5)
WBC: 8.1 10*3/uL (ref 4.0–10.5)

## 2018-02-26 LAB — HEPATIC FUNCTION PANEL
ALK PHOS: 74 U/L (ref 39–117)
ALT: 12 U/L (ref 0–53)
AST: 13 U/L (ref 0–37)
Albumin: 3.2 g/dL — ABNORMAL LOW (ref 3.5–5.2)
BILIRUBIN TOTAL: 0.3 mg/dL (ref 0.2–1.2)
Bilirubin, Direct: 0.1 mg/dL (ref 0.0–0.3)
Total Protein: 6.5 g/dL (ref 6.0–8.3)

## 2018-02-26 LAB — HEMOGLOBIN A1C: HEMOGLOBIN A1C: 6.1 % (ref 4.6–6.5)

## 2018-02-26 MED ORDER — LEVOFLOXACIN 500 MG PO TABS: 500.0000 mg | ORAL_TABLET | Freq: Every day | ORAL | 0 refills | Status: AC

## 2018-02-26 MED ORDER — CEFTRIAXONE SODIUM 1 G IJ SOLR
1.0000 g | Freq: Once | INTRAMUSCULAR | Status: AC
  Administered 2018-02-26: 1 g via INTRAMUSCULAR

## 2018-02-26 NOTE — Telephone Encounter (Signed)
Patient's wife called and said she is upset because she thought Dr Sarajane Jews was going to call in this medication for her husband and the pharmacy does not have it. Please advise

## 2018-02-26 NOTE — Addendum Note (Signed)
Addended by: Miles Costain T on: 02/26/2018 02:55 PM   Modules accepted: Orders

## 2018-02-26 NOTE — Telephone Encounter (Signed)
This was sent in to the pharmacy and I confirmed with the pharmacist, York Cerise, that they had received it

## 2018-02-26 NOTE — Progress Notes (Signed)
   Subjective:    Patient ID: Andrew Norton, male    DOB: 07-04-1927, 84 y.o.   MRN: [id number]  HPI Here with wife and son for increased weakness, a dry cough, and SOB over the last week or so. No fever. He denies any pain anywhere. He seems to choke frequently when he eats food, and a recent swallowing study demonstrated a risk for aspiration. He had some lower leg swelling a few weeks ago but this has recently gone away. His last labs were drawn in February. His glucoses have been reasonable, as has his BP.    Review of Systems  Constitutional: Positive for fatigue. Negative for chills, diaphoresis and fever.  Respiratory: Positive for cough and shortness of breath. Negative for wheezing.   Cardiovascular: Negative.   Gastrointestinal: Negative.   Neurological: Positive for weakness.       Objective:   Physical Exam  Constitutional:  Frail and in a wheelchair, alert   Neck: No thyromegaly present.  Cardiovascular: Normal rate, normal heart sounds and intact distal pulses.  Irregular rhythm   Pulmonary/Chest: Effort normal. No stridor. He has no wheezes.  He has soft diffuse crackles on both sides, but he also has loud rales at the left posterior base. CXR shows chronic ILD with a chronic opacity at the right base   Abdominal: Soft. Bowel sounds are normal. He exhibits no distension and no mass. There is no tenderness. There is no rebound and no guarding.  Musculoskeletal: He exhibits no edema.  Lymphadenopathy:    He has no cervical adenopathy.          Assessment & Plan:  He seems to have an early pneumonia. Treat with a Rocephin shot and Levaquin. Recheck in one week.  Alysia Penna, MD

## 2018-02-26 NOTE — Telephone Encounter (Signed)
Copied from [place] [id number]. Topic: Quick Communication - Rx Refill/Question >> Feb 26, 2018  4:41 PM Reyne Dumas L wrote: Medication: Lequita Halt with [facility] calling.  Pt believes he was supposed to have this medication called in for him, but they don't show anything.  Preferred Pharmacy (with phone number or street name):  [facility], [address] AT [address] [facility] [address] [address] [phone] (Phone) [phone] (Fax)     Agent: Please be advised that RX refills may take up to 3 business days. We ask that you follow-up with your pharmacy.

## 2018-02-27 ENCOUNTER — Telehealth: Payer: Self-pay

## 2018-02-27 DIAGNOSIS — R351 Nocturia: Secondary | ICD-10-CM

## 2018-02-27 NOTE — Telephone Encounter (Signed)
I spoke with [name] (wife) and she did pick up script yesterday evening. She request that once lab results are back to please call her cell number [phone].

## 2018-02-27 NOTE — Telephone Encounter (Signed)
Call pt's wife back on her mobile number [phone]

## 2018-02-27 NOTE — Telephone Encounter (Signed)
I have called and spoke with pt's wife about lab results. Wife advised and voiced understanding.

## 2018-02-27 NOTE — Telephone Encounter (Signed)
Called and spoke with pt's wife on her cell phone [phone]. Wife advised and voiced understanding.

## 2018-02-27 NOTE — Telephone Encounter (Signed)
Called and spoke with pt's wife who had some concerns about her husband taking the levaquin pt could NOT sleep at all and kept waking up to use the bathroom 6 times nightly.   Sent to [facility] to advise

## 2018-02-27 NOTE — Telephone Encounter (Signed)
Tell the wife that he was already urinating frequently at night and that this is not related to the Levaquin. Finish the Levaquin to treat his pneumonia. I will refer him to see Urology to address the frequent urinations.

## 2018-03-03 ENCOUNTER — Ambulatory Visit (INDEPENDENT_AMBULATORY_CARE_PROVIDER_SITE_OTHER): Payer: Medicare Other | Admitting: *Deleted

## 2018-03-03 DIAGNOSIS — I442 Atrioventricular block, complete: Secondary | ICD-10-CM

## 2018-03-03 NOTE — Progress Notes (Signed)
No transmission  

## 2018-03-04 NOTE — Progress Notes (Signed)
Remote pacemaker transmission.   

## 2018-03-07 ENCOUNTER — Ambulatory Visit (INDEPENDENT_AMBULATORY_CARE_PROVIDER_SITE_OTHER): Payer: Medicare Other | Admitting: Family Medicine

## 2018-03-07 ENCOUNTER — Encounter: Payer: Self-pay | Admitting: Family Medicine

## 2018-03-07 VITALS — BP 120/58 | HR 76 | Temp 97.9°F | Ht 69.0 in

## 2018-03-07 DIAGNOSIS — J181 Lobar pneumonia, unspecified organism: Secondary | ICD-10-CM

## 2018-03-07 DIAGNOSIS — J189 Pneumonia, unspecified organism: Secondary | ICD-10-CM

## 2018-03-07 DIAGNOSIS — E538 Deficiency of other specified B group vitamins: Secondary | ICD-10-CM | POA: Diagnosis not present

## 2018-03-07 DIAGNOSIS — N3281 Overactive bladder: Secondary | ICD-10-CM | POA: Diagnosis not present

## 2018-03-07 MED ORDER — SOLIFENACIN SUCCINATE 5 MG PO TABS: 5.0000 mg | ORAL_TABLET | Freq: Every day | ORAL | 3 refills | Status: DC

## 2018-03-07 MED ORDER — CYANOCOBALAMIN 1000 MCG/ML IJ SOLN
1000.0000 ug | Freq: Once | INTRAMUSCULAR | Status: AC
  Administered 2018-03-07: 1000 ug via INTRAMUSCULAR

## 2018-03-07 NOTE — Addendum Note (Signed)
Addended by: Myriam Forehand on: 03/07/2018 03:03 PM   Modules accepted: Orders

## 2018-03-07 NOTE — Progress Notes (Signed)
   Subjective:    Patient ID: Andrew Norton, male    DOB: 1926-10-16, 84 y.o.   MRN: [id number]  HPI Here to follow up on a LLL pneumonia. He was here a week ago and got a Rocephin shot, then he started on Levaquin daily. He says he feels better and he is a little stronger. His wife says he still coughs a fair amount but it is dry. No fever. He also complains about frequent urination. He often feels the urge to urinate but then he produces very little urine. He is frequently incontinent also. No burning or pain.   Review of Systems  Constitutional: Negative.   Respiratory: Positive for cough. Negative for shortness of breath and wheezing.   Cardiovascular: Negative.   Gastrointestinal: Negative.   Genitourinary: Positive for frequency and urgency. Negative for dysuria, flank pain and hematuria.       Objective:   Physical Exam  Constitutional: He is oriented to person, place, and time. He appears well-developed and well-nourished.  Cardiovascular: Normal rate, regular rhythm, normal heart sounds and intact distal pulses.  Pulmonary/Chest: Effort normal. No stridor. No respiratory distress. He has no wheezes.  He still has soft rales at the left base   Abdominal: Soft. Bowel sounds are normal. He exhibits no distension and no mass. There is no tenderness. There is no rebound and no guarding. No hernia.  Neurological: He is alert and oriented to person, place, and time.          Assessment & Plan:  LLL pneumonia, resolving. He will finish up the Levaquin. He has symptoms of an OAB. Try Vesicare 5 mg daily.  Alysia Penna, MD

## 2018-03-11 DIAGNOSIS — L602 Onychogryphosis: Secondary | ICD-10-CM | POA: Diagnosis not present

## 2018-03-11 DIAGNOSIS — E1351 Other specified diabetes mellitus with diabetic peripheral angiopathy without gangrene: Secondary | ICD-10-CM | POA: Diagnosis not present

## 2018-03-17 NOTE — Progress Notes (Signed)
Electrophysiology Office Note   Date:  03/18/2018   ID:  Dixie, Jafri 1927/05/28, MRN [id number]  PCP:  Laurey Morale, MD  Cardiologist:  Tamala Julian Primary Electrophysiologist:  Damyan Corne Meredith Leeds, MD    No chief complaint on file.    History of Present Illness: Andrew Norton is a 84 y.o. male who presents today for electrophysiology evaluation.   History of HTN, DMT2, cervical myelopathy with gain instability who was directly admitted to [facility] on 05/17/16 for new onset of jaundice and abnormal CT ab/pel scan which showed "intrahepatic biliary ductal dilatation." He went into atrial fibrillation with rapid rates while in the hospital, and was placed on flecainide. He also had evidence of bradycardia, and thus had a Medtronic dual chamber pacemaker placed on 05/28/16.  She Adolpho Meenach be admitted to the hospital with pneumonia.  Since discharge, he has been in a nursing facility and has had issues with delirium.  He is sleeping most of the day.  He has no major complaints at this time.  Today, denies symptoms of palpitations, chest pain, shortness of breath, orthopnea, PND, lower extremity edema, claudication, dizziness, presyncope, syncope, bleeding, or neurologic sequela. The patient is tolerating medications without difficulties.  He is currently getting over pneumonia.  He is quite short of breath and has been short of breath prior to his pneumonia diagnosis.  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   Past Medical History:  Diagnosis Date  . Anemia   . Arthritis    hips  . Atrial fibrillation ([place])   . Carpal tunnel syndrome    left leg sciatica  . Diabetes mellitus   . Hearing loss    left ear greater loss  . History of shingles    past hx. left hand  . Hyperlipidemia   . Hypertension    EKG, chest x ray 11/15/10 EPIC,  clearance Dr Sharlene Motts on chart/ pt states on meds to "prevent hypertension from diabetes"  . Paget's disease    scrotum  . Paroxysmal  atrial fibrillation ([place]) 05/18/2016  . Positive PPD, treated    Past Surgical History:  Procedure Laterality Date  . CERVICAL FUSION     dr Vertell Limber 2009- retained hardware  . colonoscopy  2-08   per Dr. Oletta Lamas, normal   . EP IMPLANTABLE DEVICE N/A 05/28/2016   Procedure: Pacemaker Implant;  Surgeon: Mitchael Luckey Meredith Leeds, MD;  Location: [facility];  Service: Cardiovascular;  Laterality: N/A;  . ERCP N/A 05/22/2016   Procedure: ENDOSCOPIC RETROGRADE CHOLANGIOPANCREATOGRAPHY (ERCP);  Surgeon: Milus Banister, MD;  Location: [facility];  Service: Endoscopy;  Laterality: N/A;  . EYE SURGERY     bilateral cataract extraction with IOL  . HIP FRACTURE SURGERY  11-24-10   left hip per Dr, [name]  . LUMBAR LAMINECTOMY/DECOMPRESSION MICRODISCECTOMY  11/07/2011   Procedure: LUMBAR LAMINECTOMY/DECOMPRESSION MICRODISCECTOMY;  Surgeon: Johnn Hai, MD;  Location: [facility];  Service: Orthopedics;  Laterality: N/A;  Decompression of the L4 - L5 Central (X-ray)   . partial removal of scrotum    . TONSILLECTOMY    . TOTAL HIP REVISION Left 11/05/2012   Procedure: Conversion of a Bipolar to a Left Total Hip Arthroplasty;  Surgeon: [name], MD;  Location: [facility];  Service: Orthopedics;  Laterality: Left;  Conversion of a Bipolar to a Left Total Hip Arthroplasty     Current Outpatient Medications  Medication Sig Dispense Refill  .  cyanocobalamin (,VITAMIN B-12,) 1000 MCG/ML injection Inject 1,000 mcg into the muscle every 30 (thirty) days.    . cyclobenzaprine (FLEXERIL) 10 MG tablet TAKE 1 TABLET 3 TIMES DAILYAS NEEDED FOR MUSCLE SPASMS 270 tablet 3  . ferrous sulfate 220 (44 Fe) MG/5ML solution TAKE 7 ML BY MOUTH DAILY 946 mL 11  . finasteride (PROSCAR) 5 MG tablet TAKE 1 TABLET DAILY 90 tablet 1  . flecainide (TAMBOCOR) 50 MG tablet Take 1 tablet (50 mg total) by mouth every 12 (twelve) hours. 180 tablet 2  . glipiZIDE (GLUCOTROL) 10 MG tablet Take 0.5 tablets (5 mg total) by  mouth 2 (two) times daily. 2 tablet 0  . Glucose Blood (TRUE METRIX BLOOD GLUCOSE TEST VI) by In Vitro route 2 (two) times daily. Diagnosis code is E 11.9    . JANUVIA 100 MG tablet TAKE 1 TABLET DAILY 90 tablet 3  . lidocaine (LIDODERM) 5 % Place 1 patch onto the skin daily. Remove & Discard patch within 12 hours or as directed by MD 90 patch 3  . losartan (COZAAR) 50 MG tablet TAKE 1 TABLET EVERY MORNING 90 tablet 1  . metFORMIN (GLUCOPHAGE) 1000 MG tablet TAKE 1 TABLET TWICE A DAY  WITH MEALS 180 tablet 3  . metoprolol tartrate (LOPRESSOR) 25 MG tablet Take 0.5 tablets (12.5 mg total) by mouth 2 (two) times daily. 180 tablet 0  . NUTRITIONAL SUPPLEMENT LIQD Take 120 mLs by mouth 2 (two) times daily. SF MedPass for nutritional support    . polyethylene glycol powder (GLYCOLAX/MIRALAX) powder Take 17 g by mouth daily. (Patient taking differently: Take 17 g by mouth as needed. ) 3350 g 3  . rivaroxaban (XARELTO) 20 MG TABS tablet Take 1 tablet (20 mg total) by mouth daily with supper. 90 tablet 2  . solifenacin (VESICARE) 5 MG tablet Take 1 tablet (5 mg total) by mouth daily. 30 tablet 3   No current facility-administered medications for this visit.     Allergies:   Penicillins and Ppd [tuberculin purified protein derivative]   Social History:  The patient  reports that he has never smoked. He has never used smokeless tobacco. He reports that he does not drink alcohol or use drugs.   Family History:  The patient's family history includes Heart failure in his father; Tuberculosis in his mother.    ROS:  Please see the history of present illness.   Otherwise, review of systems is positive for pain, visual changes, shortness of breath, difficulty urinating, balance problems, dizziness, easy bruising.   All other systems are reviewed and negative.   PHYSICAL EXAM: VS:  BP 124/62   Pulse 74   Ht 5\' 9"  (1.753 m)   Wt 149 lb (67.6 kg)   SpO2 95%   BMI 22.00 kg/m  , BMI Body mass index is 22  kg/m. GEN: Well nourished, well developed, in no acute distress  HEENT: normal  Neck: no JVD, carotid bruits, or masses Cardiac: RRR; no murmurs, rubs, or gallops,no edema  Respiratory:  clear to auscultation bilaterally, normal work of breathing GI: soft, nontender, nondistended, + BS MS: no deformity or atrophy  Skin: warm and dry, device site well healed Neuro:  Strength and sensation are intact Psych: euthymic mood, full affect  EKG:  EKG is ordered today. Personal review of the ekg ordered  shows this rhythm, voltage for LVH, anteroseptal infarct  Personal review of the device interrogation today. Results in [place]: 08/21/2017: TSH 2.66 08/28/2017:  Magnesium 1.4 02/26/2018: ALT 12; BUN 21; Creatinine, Ser 0.78; Hemoglobin 9.5; Platelets 311.0; Potassium 5.2; Sodium 129    Lipid Panel     Component Value Date/Time   CHOL 128 11/29/2016 1010   TRIG 126.0 11/29/2016 1010   HDL 47.00 11/29/2016 1010   CHOLHDL 3 11/29/2016 1010   VLDL 25.2 11/29/2016 1010   LDLCALC 56 11/29/2016 1010     Wt Readings from Last 3 Encounters:  03/18/18 149 lb (67.6 kg)  11/21/17 149 lb (67.6 kg)  11/21/17 149 lb 3.2 oz (67.7 kg)      Other studies Reviewed: Additional studies/ records that were reviewed today include: TTE 05/19/16  Review of the above records today demonstrates:  - Left ventricle: The cavity size was normal. Wall thickness was   increased in a pattern of mild LVH. Systolic function was normal.   The estimated ejection fraction was in the range of 55% to 60%.   Wall motion was normal; there were no regional wall motion   abnormalities. Doppler parameters are consistent with abnormal   left ventricular relaxation (grade 1 diastolic dysfunction). - Aortic valve: There was trivial regurgitation. - Mitral valve: Calcified annulus. - Pulmonary arteries: Systolic pressure was mildly increased. PA   peak pressure: 52 mm Hg (S).   ASSESSMENT AND PLAN:  1.   Tachybradycardia syndrome: Status post Medtronic dual-chamber pacemaker implanted 05/29/2016.  Device functioning appropriately.  Has currently high threshold.  He rarely paces.  No changes.     2. Paroxysmal atrial fibrillation: He on flecainide, metoprolol, Xarelto.  In sinus rhythm.  No changes.  This patients CHA2DS2-VASc Score and unadjusted Ischemic Stroke Rate (% per year) is equal to 4.8 % stroke rate/year from a score of 4  Above score calculated as 1 point each if present [CHF, HTN, DM, Vascular=MI/PAD/Aortic Plaque, Age if 65-74, or Male] Above score calculated as 2 points each if present [Age > 75, or Stroke/TIA/TE]    3. Hypertension: Well-controlled today.  No changes.  4. Hyperlipidemia: Continue statin  5.  Shortness of breath: At this point it is unclear as to the cause of his shortness of breath.  It could potentially be due to his pneumonia.  We Paulino Cork plan for BNP today.    Current medicines are reviewed at length with the patient today.   The patient does not have concerns regarding his medicines.  The following changes were made today: none  Labs/ tests ordered today include:  Orders Placed This Encounter  Procedures  . EKG 12-Lead     Disposition:   FU with Dollie Mayse 12 months  Signed, Carnetta Losada Meredith Leeds, MD  03/18/2018 12:24 PM     [facility] [address] [place] [id number] [phone] (office) [phone] (fax)

## 2018-03-18 ENCOUNTER — Ambulatory Visit (INDEPENDENT_AMBULATORY_CARE_PROVIDER_SITE_OTHER): Payer: Medicare Other | Admitting: Cardiology

## 2018-03-18 ENCOUNTER — Encounter: Payer: Self-pay | Admitting: Cardiology

## 2018-03-18 VITALS — BP 124/62 | HR 74 | Ht 69.0 in | Wt 149.0 lb

## 2018-03-18 DIAGNOSIS — I480 Paroxysmal atrial fibrillation: Secondary | ICD-10-CM | POA: Diagnosis not present

## 2018-03-18 DIAGNOSIS — I10 Essential (primary) hypertension: Secondary | ICD-10-CM | POA: Diagnosis not present

## 2018-03-18 DIAGNOSIS — R0602 Shortness of breath: Secondary | ICD-10-CM

## 2018-03-18 DIAGNOSIS — E785 Hyperlipidemia, unspecified: Secondary | ICD-10-CM

## 2018-03-18 DIAGNOSIS — I495 Sick sinus syndrome: Secondary | ICD-10-CM | POA: Diagnosis not present

## 2018-03-18 LAB — CUP PACEART INCLINIC DEVICE CHECK
Battery Remaining Longevity: 103 mo
Battery Voltage: 3.02 V
Brady Statistic AP VP Percent: 0.01 %
Brady Statistic AS VS Percent: 91.54 %
Brady Statistic RA Percent Paced: 8.45 %
Implantable Lead Implant Date: 20171113
Implantable Lead Implant Date: 20171113
Implantable Lead Location: 753860
Implantable Lead Model: 5076
Implantable Pulse Generator Implant Date: 20171113
Lead Channel Impedance Value: 342 Ohm
Lead Channel Impedance Value: 380 Ohm
Lead Channel Impedance Value: 437 Ohm
Lead Channel Pacing Threshold Amplitude: 1.5 V
Lead Channel Pacing Threshold Pulse Width: 0.8 ms
Lead Channel Pacing Threshold Pulse Width: 1 ms
Lead Channel Sensing Intrinsic Amplitude: 1.125 mV
Lead Channel Sensing Intrinsic Amplitude: 1.25 mV
Lead Channel Setting Pacing Amplitude: 3 V
Lead Channel Setting Sensing Sensitivity: 1.2 mV
MDC IDC LEAD LOCATION: 753859
MDC IDC MSMT LEADCHNL RA IMPEDANCE VALUE: 361 Ohm
MDC IDC MSMT LEADCHNL RV PACING THRESHOLD AMPLITUDE: 2 V
MDC IDC MSMT LEADCHNL RV SENSING INTR AMPL: 5 mV
MDC IDC MSMT LEADCHNL RV SENSING INTR AMPL: 5.875 mV
MDC IDC SESS DTM: 20190903133520
MDC IDC SET LEADCHNL RV PACING AMPLITUDE: 4 V
MDC IDC SET LEADCHNL RV PACING PULSEWIDTH: 0.8 ms
MDC IDC STAT BRADY AP VS PERCENT: 8.45 %
MDC IDC STAT BRADY AS VP PERCENT: 0 %
MDC IDC STAT BRADY RV PERCENT PACED: 0.01 %

## 2018-03-18 NOTE — Addendum Note (Signed)
Addended by: Stanton Kidney on: 03/18/2018 12:43 PM   Modules accepted: Orders

## 2018-03-18 NOTE — Patient Instructions (Signed)
Medication Instructions:  Your physician recommends that you continue on your current medications as directed. Please refer to the Current Medication list given to you today.  * If you need a refill on your cardiac medications before your next appointment, please call your pharmacy.   Labwork: Today: BNP  Testing/Procedures: None ordered  Follow-Up: Remote monitoring is used to monitor your Pacemaker of ICD from home. This monitoring reduces the number of office visits required to check your device to one time per year. It allows [place] to keep an eye on the functioning of your device to ensure it is working properly. You are scheduled for a device check from home on 06/02/2018. You may send your transmission at any time that day. If you have a wireless device, the transmission will be sent automatically. After your physician reviews your transmission, you will receive a postcard with your next transmission date.  Your physician wants you to follow-up in: 6 months  with Dr. Curt Bears.  You will receive a reminder letter in the mail two months in advance. If you don't receive a letter, please call our office to schedule the follow-up appointment.   Thank you for choosing [facility]!!   [name], RN [phone]

## 2018-03-19 LAB — PRO B NATRIURETIC PEPTIDE: NT-PRO BNP: 676 pg/mL — AB (ref 0–486)

## 2018-03-20 DIAGNOSIS — H353132 Nonexudative age-related macular degeneration, bilateral, intermediate dry stage: Secondary | ICD-10-CM | POA: Diagnosis not present

## 2018-03-20 DIAGNOSIS — H35033 Hypertensive retinopathy, bilateral: Secondary | ICD-10-CM | POA: Diagnosis not present

## 2018-03-20 DIAGNOSIS — H35433 Paving stone degeneration of retina, bilateral: Secondary | ICD-10-CM | POA: Diagnosis not present

## 2018-03-20 DIAGNOSIS — H34832 Tributary (branch) retinal vein occlusion, left eye, with macular edema: Secondary | ICD-10-CM | POA: Diagnosis not present

## 2018-03-21 ENCOUNTER — Telehealth: Payer: Self-pay | Admitting: Family Medicine

## 2018-03-21 NOTE — Telephone Encounter (Signed)
Pts son Jahden Schara) dropped off [place] form to be filled out for the pt.  Upon completion pts son would like for it to be faxed to [facility] to the Attn: of Leroy Sea at [phone].  Form was put in the doctors folder for completion.

## 2018-03-21 NOTE — Telephone Encounter (Signed)
Have not received form yet

## 2018-03-21 NOTE — Telephone Encounter (Signed)
Placed in Dr. Barbie Banner RED folder

## 2018-03-24 ENCOUNTER — Telehealth: Payer: Self-pay | Admitting: Cardiology

## 2018-03-24 DIAGNOSIS — C44329 Squamous cell carcinoma of skin of other parts of face: Secondary | ICD-10-CM | POA: Diagnosis not present

## 2018-03-24 DIAGNOSIS — S0100XD Unspecified open wound of scalp, subsequent encounter: Secondary | ICD-10-CM | POA: Diagnosis not present

## 2018-03-24 DIAGNOSIS — Z0279 Encounter for issue of other medical certificate: Secondary | ICD-10-CM

## 2018-03-24 DIAGNOSIS — D485 Neoplasm of uncertain behavior of skin: Secondary | ICD-10-CM | POA: Diagnosis not present

## 2018-03-24 MED ORDER — FUROSEMIDE 20 MG PO TABS: 20.0000 mg | ORAL_TABLET | Freq: Every day | ORAL | 0 refills | Status: DC

## 2018-03-24 NOTE — Telephone Encounter (Signed)
The form is ready  

## 2018-03-24 NOTE — Telephone Encounter (Signed)
New Message ° ° °Pt returning call for nurse about labs °

## 2018-03-24 NOTE — Telephone Encounter (Signed)
Results reviewed w/ wife (DPR on file) Pt increase K+ intake while on low dose Lasix. Rx sent to local pharmacy

## 2018-03-24 NOTE — Telephone Encounter (Signed)
Form has been faxed and placed to be scanned into pt's chart. Copy placed up front for pick up.

## 2018-03-25 NOTE — Telephone Encounter (Signed)
Got a CRM that [facility] only got one page of the fax.  Forms were faxed again - got confirmation that all pages were sent successfully.

## 2018-03-28 LAB — CUP PACEART REMOTE DEVICE CHECK
Battery Remaining Longevity: 106 mo
Battery Voltage: 3.02 V
Brady Statistic AP VP Percent: 0.01 %
Brady Statistic AP VS Percent: 10.17 %
Brady Statistic AS VS Percent: 89.82 %
Brady Statistic RV Percent Paced: 0.01 %
Implantable Lead Implant Date: 20171113
Implantable Lead Location: 753859
Implantable Lead Model: 5076
Lead Channel Impedance Value: 323 Ohm
Lead Channel Impedance Value: 361 Ohm
Lead Channel Impedance Value: 437 Ohm
Lead Channel Impedance Value: 456 Ohm
Lead Channel Pacing Threshold Amplitude: 1.75 V
Lead Channel Pacing Threshold Amplitude: 2.5 V
Lead Channel Pacing Threshold Pulse Width: 0.4 ms
Lead Channel Sensing Intrinsic Amplitude: 1.125 mV
Lead Channel Sensing Intrinsic Amplitude: 4.5 mV
Lead Channel Setting Pacing Amplitude: 4 V
Lead Channel Setting Sensing Sensitivity: 1.2 mV
MDC IDC LEAD IMPLANT DT: 20171113
MDC IDC LEAD LOCATION: 753860
MDC IDC MSMT LEADCHNL RA PACING THRESHOLD PULSEWIDTH: 0.4 ms
MDC IDC MSMT LEADCHNL RA SENSING INTR AMPL: 1.125 mV
MDC IDC MSMT LEADCHNL RV SENSING INTR AMPL: 4.5 mV
MDC IDC PG IMPLANT DT: 20171113
MDC IDC SESS DTM: 20190820192357
MDC IDC SET LEADCHNL RA PACING AMPLITUDE: 2.5 V
MDC IDC SET LEADCHNL RV PACING PULSEWIDTH: 0.8 ms
MDC IDC STAT BRADY AS VP PERCENT: 0 %
MDC IDC STAT BRADY RA PERCENT PACED: 10.17 %

## 2018-04-04 DIAGNOSIS — C44329 Squamous cell carcinoma of skin of other parts of face: Secondary | ICD-10-CM | POA: Diagnosis not present

## 2018-04-10 ENCOUNTER — Ambulatory Visit (INDEPENDENT_AMBULATORY_CARE_PROVIDER_SITE_OTHER): Payer: Medicare Other

## 2018-04-10 DIAGNOSIS — E538 Deficiency of other specified B group vitamins: Secondary | ICD-10-CM | POA: Diagnosis not present

## 2018-04-11 MED ORDER — CYANOCOBALAMIN 1000 MCG/ML IJ SOLN
1000.0000 ug | Freq: Once | INTRAMUSCULAR | Status: AC
  Administered 2018-04-10: 1000 ug via INTRAMUSCULAR

## 2018-04-14 DIAGNOSIS — R351 Nocturia: Secondary | ICD-10-CM | POA: Diagnosis not present

## 2018-04-14 DIAGNOSIS — N3941 Urge incontinence: Secondary | ICD-10-CM | POA: Diagnosis not present

## 2018-04-14 DIAGNOSIS — R3915 Urgency of urination: Secondary | ICD-10-CM | POA: Diagnosis not present

## 2018-04-16 ENCOUNTER — Telehealth: Payer: Self-pay | Admitting: Family Medicine

## 2018-04-16 MED ORDER — FERROUS SULFATE 220 (44 FE) MG/5ML PO ELIX: ORAL_SOLUTION | ORAL | 1 refills | Status: AC

## 2018-04-16 NOTE — Telephone Encounter (Signed)
Copied from Andrew Norton [phone]. Topic: General - Other >> Apr 16, 2018 10:41 AM [name] wrote: Pt never picked up the RX ferrous sulfate 220 (44 Fe) MG/5ML solution  that was called in 12/02/17    Pt req new Rx be sent to the pharmacy   [facility]

## 2018-04-16 NOTE — Addendum Note (Signed)
Addended by: Elie Confer on: 04/16/2018 02:52 PM   Modules accepted: Orders

## 2018-04-16 NOTE — Telephone Encounter (Signed)
Refill has been sent to the pharmacy.  

## 2018-04-23 ENCOUNTER — Ambulatory Visit (INDEPENDENT_AMBULATORY_CARE_PROVIDER_SITE_OTHER): Payer: Medicare Other | Admitting: Family Medicine

## 2018-04-23 ENCOUNTER — Encounter: Payer: Self-pay | Admitting: Family Medicine

## 2018-04-23 VITALS — BP 120/66 | HR 66 | Temp 97.4°F | Wt 146.1 lb

## 2018-04-23 DIAGNOSIS — I509 Heart failure, unspecified: Secondary | ICD-10-CM | POA: Diagnosis not present

## 2018-04-23 DIAGNOSIS — F411 Generalized anxiety disorder: Secondary | ICD-10-CM

## 2018-04-23 DIAGNOSIS — N3281 Overactive bladder: Secondary | ICD-10-CM

## 2018-04-23 DIAGNOSIS — J181 Lobar pneumonia, unspecified organism: Secondary | ICD-10-CM

## 2018-04-23 DIAGNOSIS — F0391 Unspecified dementia with behavioral disturbance: Secondary | ICD-10-CM

## 2018-04-23 DIAGNOSIS — I480 Paroxysmal atrial fibrillation: Secondary | ICD-10-CM

## 2018-04-23 DIAGNOSIS — Z23 Encounter for immunization: Secondary | ICD-10-CM

## 2018-04-23 DIAGNOSIS — I10 Essential (primary) hypertension: Secondary | ICD-10-CM

## 2018-04-23 MED ORDER — SOLIFENACIN SUCCINATE 10 MG PO TABS: 10.0000 mg | ORAL_TABLET | Freq: Every day | ORAL | 0 refills | Status: AC

## 2018-04-23 MED ORDER — TEMAZEPAM 15 MG PO CAPS: 15.0000 mg | ORAL_CAPSULE | Freq: Every evening | ORAL | 5 refills | Status: AC | PRN

## 2018-04-23 MED ORDER — FUROSEMIDE 20 MG PO TABS: ORAL_TABLET | ORAL | 5 refills | Status: AC

## 2018-04-23 NOTE — Progress Notes (Signed)
   Subjective:    Patient ID: Andrew Norton, male    DOB: 07-20-1926, 84 y.o.   MRN: [id number]  HPI Here for several issues. First he has been coughing for several weeks, and he wants to know if he can have a flu shot. No fever. He produces scant clear sputum. No ankle swelling. He seems to have recovered from the pneumonia he had in August. He saw Dr. Curt Bears a month ago and he had an elevated BNP today. Dr. Curt Bears gave him a 3 day trial of taking Lasix 20 mg daily for 3 days. This was difficult for the patient since he requires assistance to stand up to urinate and he had some accidents. Also Ethridge's wife mentions that he often has trouble sleeping and he seems to get anxious during the night. No hallucinations or agitation. His BP has been stable. He saw Urology last week and they decided to stop the Proscar and to increase the dose of Vesicare to 10 mg daily. So far this has helped his urinary frequency.    Review of Systems  Constitutional: Negative.   Respiratory: Positive for cough. Negative for choking, shortness of breath and wheezing.   Cardiovascular: Positive for leg swelling. Negative for chest pain and palpitations.  Gastrointestinal: Negative.   Genitourinary: Positive for frequency. Negative for dysuria and urgency.  Neurological: Negative.   Psychiatric/Behavioral: Positive for confusion and sleep disturbance. Negative for agitation, dysphoric mood, hallucinations and self-injury. The patient is nervous/anxious.        Objective:   Physical Exam  Constitutional: He is oriented to person, place, and time.  Frail, in a wheelchair, alert   Cardiovascular: Normal rate, regular rhythm, normal heart sounds and intact distal pulses.  Pulmonary/Chest: Effort normal. No stridor. No respiratory distress. He has no wheezes.  He has faint rales at both bases   Musculoskeletal: He exhibits no edema.  Neurological: He is alert and oriented to person, place, and time.            Assessment & Plan:  His HTN is stable. His CHF has caused some slight pulmonary edema, leading to the coughing. He will try taking Lasix 20 mg every other day. He is given a flu shot today. His OAB is stable.  Alysia Penna, MD

## 2018-04-28 ENCOUNTER — Telehealth: Payer: Self-pay | Admitting: *Deleted

## 2018-04-28 DIAGNOSIS — F0391 Unspecified dementia with behavioral disturbance: Secondary | ICD-10-CM

## 2018-04-28 DIAGNOSIS — E43 Unspecified severe protein-calorie malnutrition: Secondary | ICD-10-CM

## 2018-04-28 NOTE — Telephone Encounter (Signed)
pts son came to the office to speak with Dr. Sarajane Jews.  I have called and spoke with pt and he had a few questions for Dr. Sarajane Jews.    1.  Will the pts pacemaker stop him from passing away?  2.  Palliative care vs Hospice This has been discussed before and the son feels that now is the time to bring someone in.    Dr. Sarajane Jews please advise. Thanks

## 2018-04-28 NOTE — Telephone Encounter (Signed)
First tell him the pacemaker can help to prevent a slow heartbeat, but it would not stop any other fatal conditions. I agree with getting Cort[place] into Palliative Care is a great idea. This would also help the family. Please contact [place] to get him enrolled. I'm sure they will have paperwork for me to sign as well. The main diagnoses are severe protein-calorie malnutrition and dementia

## 2018-04-29 NOTE — Telephone Encounter (Signed)
Called and spoke with pts son and he is aware of Dr. Barbie Banner recs and that the order has been placed for palliative care.

## 2018-04-30 ENCOUNTER — Telehealth: Payer: Self-pay

## 2018-05-02 ENCOUNTER — Telehealth: Payer: Self-pay | Admitting: Family Medicine

## 2018-05-02 MED ORDER — HYDROCODONE-ACETAMINOPHEN 5-325 MG PO TABS: 1.0000 | ORAL_TABLET | Freq: Four times a day (QID) | ORAL | 0 refills | Status: AC | PRN

## 2018-05-02 NOTE — Telephone Encounter (Signed)
Called and spoke with pts son, scott and he stated that the pt has declined since we spoke 2 days ago.  He is wanting to try and get someone in from hospice/palliative care ASAP---he also stated that the pt has not been eating much but has been drinking and is sleeping a lot more. The pt is c/o that he is in pain all the time.    I have left a message for the Hospice and Palliative coordinator to call me back about getting the pt in faster.  Dr. Sarajane Jews please advise about pain meds. thanks

## 2018-05-02 NOTE — Telephone Encounter (Signed)
[place] was sent in

## 2018-05-02 NOTE — Telephone Encounter (Signed)
Monishia with Palliative Care called, she states they work M-F 8:30-5 and would not be able to see pt this weekend. They can see him on Monday. Pt's son has already been made aware they should be calling him Monday.

## 2018-05-02 NOTE — Telephone Encounter (Signed)
Copied from [place] [phone]. Topic: General - Other >> May 02, 2018 10:50 AM Yvette Rack wrote: Reason for CRM: Pt son Nicki Reaper states he needs to speak with Amy regarding their conversation about Hospice and Palliative Care.  Cb# [phone]

## 2018-05-02 NOTE — Telephone Encounter (Signed)
Copied from [place] [phone]. Topic: General - Other >> May 02, 2018  1:44 PM Lennox Solders wrote: Reason for CRM: pt son scott is calling his dad has been complaining of pain allover for a couple of days. Nicki Reaper would like to know if dr fry will call in mild pain medication. Pt has been going down hill within last 2 days. Pt has ask to speak  with his minister .[facility]

## 2018-05-02 NOTE — Telephone Encounter (Signed)
Called and spoke with scott and he is aware of the pain meds that have been sent in.  He is aware that I did speak with hospice and that they may contact him today---if not then it would be Monday.

## 2018-05-05 ENCOUNTER — Telehealth: Payer: Self-pay | Admitting: Internal Medicine

## 2018-05-06 NOTE — Telephone Encounter (Signed)
VM left for patient.

## 2018-05-16 NOTE — Telephone Encounter (Signed)
Thank you for letting me know. He went quickly.

## 2018-05-16 NOTE — Telephone Encounter (Signed)
FYI - I was called this morning regarding Andrew Norton.  His family found him unresponsive and called EMS.  CPR was started initially, but then stopped.  Patient did pass.  Death certificate will be sent to Dr Sarajane Jews to sign.

## 2018-05-16 DEATH — deceased

## 2018-05-20 ENCOUNTER — Telehealth: Payer: Self-pay | Admitting: Neurology

## 2018-05-20 NOTE — Telephone Encounter (Signed)
Patient;s wife called to advise patient passed away on 05/24/2018.

## 2018-05-26 ENCOUNTER — Ambulatory Visit: Payer: Medicare Other | Admitting: Neurology

## 2018-06-02 ENCOUNTER — Telehealth: Payer: Self-pay

## 2018-06-02 NOTE — Telephone Encounter (Signed)
LMOVM reminding pt to send remote transmission.   

## 2018-06-05 ENCOUNTER — Encounter: Payer: Self-pay | Admitting: Cardiology

## 2018-06-14 ENCOUNTER — Other Ambulatory Visit: Payer: Self-pay | Admitting: Family Medicine
